# Patient Record
Sex: Female | Born: 1935
Health system: Southern US, Community
[De-identification: ages and names within clinical notes are randomized; demographics above are authoritative.]

## PROBLEM LIST (undated history)

## (undated) DIAGNOSIS — M545 Other chronic pain: Secondary | ICD-10-CM

## (undated) DIAGNOSIS — I712 Thoracic aortic aneurysm, without rupture, unspecified: Secondary | ICD-10-CM

## (undated) DIAGNOSIS — M1710 Unilateral primary osteoarthritis, unspecified knee: Secondary | ICD-10-CM

## (undated) DIAGNOSIS — K5792 Diverticulitis of intestine, part unspecified, without perforation or abscess without bleeding: Secondary | ICD-10-CM

## (undated) DIAGNOSIS — E7800 Pure hypercholesterolemia, unspecified: Secondary | ICD-10-CM

## (undated) DIAGNOSIS — I10 Essential (primary) hypertension: Secondary | ICD-10-CM

## (undated) DIAGNOSIS — E669 Obesity, unspecified: Secondary | ICD-10-CM

## (undated) DIAGNOSIS — G8929 Other chronic pain: Secondary | ICD-10-CM

## (undated) DIAGNOSIS — L280 Lichen simplex chronicus: Secondary | ICD-10-CM

## (undated) DIAGNOSIS — G454 Transient global amnesia: Secondary | ICD-10-CM

## (undated) DIAGNOSIS — M8580 Other specified disorders of bone density and structure, unspecified site: Secondary | ICD-10-CM

## (undated) DIAGNOSIS — N390 Urinary tract infection, site not specified: Secondary | ICD-10-CM

## (undated) DIAGNOSIS — M179 Osteoarthritis of knee, unspecified: Secondary | ICD-10-CM

## (undated) HISTORY — DX: Low back pain: M54.5

## (undated) HISTORY — DX: Obesity, unspecified: E66.9

## (undated) HISTORY — DX: Lichen simplex chronicus: L28.0

## (undated) HISTORY — PX: APPENDECTOMY: SHX54

## (undated) HISTORY — DX: Other specified disorders of bone density and structure, unspecified site: M85.80

## (undated) HISTORY — DX: Pure hypercholesterolemia, unspecified: E78.00

## (undated) HISTORY — DX: Other chronic pain: M54.50

## (undated) HISTORY — DX: Osteoarthritis of knee, unspecified: M17.9

## (undated) HISTORY — DX: Other chronic pain: G89.29

## (undated) HISTORY — DX: Urinary tract infection, site not specified: N39.0

## (undated) HISTORY — DX: Essential (primary) hypertension: I10

## (undated) HISTORY — DX: Diverticulitis of intestine, part unspecified, without perforation or abscess without bleeding: K57.92

## (undated) HISTORY — DX: Transient global amnesia: G45.4

## (undated) HISTORY — PX: TOTAL KNEE ARTHROPLASTY: SHX125

## (undated) HISTORY — DX: Unilateral primary osteoarthritis, unspecified knee: M17.10

---

## 1998-11-15 ENCOUNTER — Encounter (INDEPENDENT_AMBULATORY_CARE_PROVIDER_SITE_OTHER): Payer: Self-pay

## 1998-11-15 ENCOUNTER — Other Ambulatory Visit: Admission: RE | Admit: 1998-11-15 | Discharge: 1998-11-15 | Payer: Self-pay | Admitting: Obstetrics and Gynecology

## 1999-06-04 ENCOUNTER — Encounter (INDEPENDENT_AMBULATORY_CARE_PROVIDER_SITE_OTHER): Payer: Self-pay | Admitting: Specialist

## 1999-06-04 ENCOUNTER — Ambulatory Visit (HOSPITAL_COMMUNITY): Admission: RE | Admit: 1999-06-04 | Discharge: 1999-06-04 | Payer: Self-pay | Admitting: Obstetrics and Gynecology

## 1999-08-27 ENCOUNTER — Other Ambulatory Visit: Admission: RE | Admit: 1999-08-27 | Discharge: 1999-08-27 | Payer: Self-pay | Admitting: Obstetrics and Gynecology

## 2001-01-20 ENCOUNTER — Other Ambulatory Visit: Admission: RE | Admit: 2001-01-20 | Discharge: 2001-01-20 | Payer: Self-pay | Admitting: Obstetrics and Gynecology

## 2002-01-24 ENCOUNTER — Encounter: Payer: Self-pay | Admitting: Orthopaedic Surgery

## 2002-01-27 ENCOUNTER — Inpatient Hospital Stay (HOSPITAL_COMMUNITY): Admission: RE | Admit: 2002-01-27 | Discharge: 2002-01-31 | Payer: Self-pay | Admitting: Orthopaedic Surgery

## 2002-02-24 HISTORY — PX: EXPLORATORY LAPAROTOMY: SUR591

## 2002-02-24 HISTORY — PX: COLON RESECTION SIGMOID: SHX6737

## 2002-03-08 ENCOUNTER — Encounter: Admission: RE | Admit: 2002-03-08 | Discharge: 2002-04-19 | Payer: Self-pay | Admitting: Orthopaedic Surgery

## 2002-08-12 ENCOUNTER — Encounter: Admission: RE | Admit: 2002-08-12 | Discharge: 2002-08-12 | Payer: Self-pay | Admitting: Internal Medicine

## 2002-08-12 ENCOUNTER — Encounter: Payer: Self-pay | Admitting: Internal Medicine

## 2002-08-31 ENCOUNTER — Encounter: Payer: Self-pay | Admitting: General Surgery

## 2002-08-31 ENCOUNTER — Ambulatory Visit (HOSPITAL_COMMUNITY): Admission: RE | Admit: 2002-08-31 | Discharge: 2002-08-31 | Payer: Self-pay | Admitting: General Surgery

## 2002-09-02 ENCOUNTER — Encounter: Payer: Self-pay | Admitting: Internal Medicine

## 2002-09-02 ENCOUNTER — Ambulatory Visit (HOSPITAL_COMMUNITY): Admission: RE | Admit: 2002-09-02 | Discharge: 2002-09-02 | Payer: Self-pay | Admitting: Internal Medicine

## 2002-09-14 ENCOUNTER — Encounter: Payer: Self-pay | Admitting: General Surgery

## 2002-09-14 ENCOUNTER — Encounter: Admission: RE | Admit: 2002-09-14 | Discharge: 2002-09-14 | Payer: Self-pay | Admitting: General Surgery

## 2002-09-28 ENCOUNTER — Encounter: Payer: Self-pay | Admitting: General Surgery

## 2002-09-29 ENCOUNTER — Ambulatory Visit (HOSPITAL_COMMUNITY): Admission: RE | Admit: 2002-09-29 | Discharge: 2002-09-29 | Payer: Self-pay | Admitting: General Surgery

## 2002-10-05 ENCOUNTER — Encounter (INDEPENDENT_AMBULATORY_CARE_PROVIDER_SITE_OTHER): Payer: Self-pay | Admitting: Specialist

## 2002-10-05 ENCOUNTER — Inpatient Hospital Stay (HOSPITAL_COMMUNITY): Admission: RE | Admit: 2002-10-05 | Discharge: 2002-10-13 | Payer: Self-pay | Admitting: General Surgery

## 2002-10-06 ENCOUNTER — Encounter: Payer: Self-pay | Admitting: General Surgery

## 2004-03-20 ENCOUNTER — Other Ambulatory Visit: Admission: RE | Admit: 2004-03-20 | Discharge: 2004-03-20 | Payer: Self-pay | Admitting: Internal Medicine

## 2004-10-05 ENCOUNTER — Encounter: Admission: RE | Admit: 2004-10-05 | Discharge: 2004-10-05 | Payer: Self-pay | Admitting: Orthopaedic Surgery

## 2004-11-05 ENCOUNTER — Inpatient Hospital Stay (HOSPITAL_COMMUNITY): Admission: RE | Admit: 2004-11-05 | Discharge: 2004-11-08 | Payer: Self-pay | Admitting: Orthopaedic Surgery

## 2004-12-02 ENCOUNTER — Encounter: Admission: RE | Admit: 2004-12-02 | Discharge: 2005-01-28 | Payer: Self-pay | Admitting: Orthopaedic Surgery

## 2005-05-17 ENCOUNTER — Emergency Department (HOSPITAL_COMMUNITY): Admission: EM | Admit: 2005-05-17 | Discharge: 2005-05-17 | Payer: Self-pay | Admitting: Emergency Medicine

## 2006-04-09 ENCOUNTER — Encounter: Admission: RE | Admit: 2006-04-09 | Discharge: 2006-04-09 | Payer: Self-pay | Admitting: Orthopedic Surgery

## 2006-06-11 ENCOUNTER — Other Ambulatory Visit: Admission: RE | Admit: 2006-06-11 | Discharge: 2006-06-11 | Payer: Self-pay | Admitting: Internal Medicine

## 2007-01-08 ENCOUNTER — Inpatient Hospital Stay (HOSPITAL_COMMUNITY): Admission: EM | Admit: 2007-01-08 | Discharge: 2007-01-12 | Payer: Self-pay | Admitting: Emergency Medicine

## 2009-06-05 ENCOUNTER — Encounter: Admission: RE | Admit: 2009-06-05 | Discharge: 2009-06-05 | Payer: Self-pay | Admitting: Otolaryngology

## 2009-07-10 ENCOUNTER — Other Ambulatory Visit: Admission: RE | Admit: 2009-07-10 | Discharge: 2009-07-10 | Payer: Self-pay | Admitting: Internal Medicine

## 2010-06-30 ENCOUNTER — Emergency Department (HOSPITAL_BASED_OUTPATIENT_CLINIC_OR_DEPARTMENT_OTHER)
Admission: EM | Admit: 2010-06-30 | Discharge: 2010-06-30 | Disposition: A | Discharge status: Home / Self Care | Payer: Medicare Other | Attending: Emergency Medicine | Admitting: Emergency Medicine

## 2010-06-30 DIAGNOSIS — R040 Epistaxis: Secondary | ICD-10-CM | POA: Insufficient documentation

## 2010-06-30 DIAGNOSIS — I10 Essential (primary) hypertension: Secondary | ICD-10-CM | POA: Insufficient documentation

## 2010-06-30 DIAGNOSIS — Z79899 Other long term (current) drug therapy: Secondary | ICD-10-CM | POA: Insufficient documentation

## 2010-07-09 NOTE — Discharge Summary (Signed)
Latoya Curry, Latoya Curry NO.:  [id number]   MEDICAL RECORD NO.:  [id number]          PATIENT TYPE:  INP   LOCATION:  5034                         FACILITY:  [facility]   PHYSICIAN:  Latoya Curry, M.D.DATE OF BIRTH:  [birth year]   DATE OF ADMISSION:  01/08/2007  DATE OF DISCHARGE:  01/12/2007                               DISCHARGE SUMMARY   ADMISSION DIAGNOSES:  1. Impacted left subcapital femur fracture.  2. Hypokalemia.  3. Hypertension.  4. Hyperlipidemia.  5. History of diverticulosis.  6. Bladder prolapse.   DISCHARGE DIAGNOSES:  1. Impacted left subcapital femur fracture status post cannulated      pinning.  2. Constipation.  3. Hypertension.  4. Hyperlipidemia.  5. History of diverticulosis.  6. Bladder prolapse on prophylactic antibiotics.   SURGICAL PROCEDURES:  On January 09, 2007, [name] underwent a in  situ cannulated pinning of her left impacted subcapital femur fracture.  This was done by Dr. Claude Manges. Curry assisted by Rexene Edison, PA-C.   COMPLICATIONS:  None.   CONSULTS:  1. An [facility] medical consult by Dr. Kevan Curry done January 09, 2007.  2. Physical therapy consult on January 10, 2007, in addition to a      case management consult.   HISTORY OF PRESENT ILLNESS:  This 77 year old white female patient  presented to the emergency department with a history of bilateral knee  replacements.  She reports she fell off the side of the tub last night  at about 8:00 p.m. while shaving her legs.  She started having  increasing pain on the left hip after the fall, and then this morning  came to the ER.  She was found to have an impacted subcapital femur  fracture, and because of that she is being admitted for surgical  fixation.   HOSPITAL COURSE:  On admission, the patient's potassium was low; that  was supplemented.  A medical consult was obtained for preoperative  clearance.  They felt she was stable for surgery.   She subsequently  underwent surgery on January 09, 2007.   On postoperative day #1, T max was 97.4, vitals were stable.  The leg  was neurovascularly intact.  Potassium was 3.2 and supplements were  continued.  She was started on therapy per protocol.   On postoperative day #2, she was feeling better.  She has done well with  therapy.  Potassium was 4.6.  Hemoglobin and hematocrit were 12.8 and  37.  Vitals were stable.  The leg was neurovascularly intact.  She had  some constipation that was treated with a laxative and plans were made  for probable discharge home the next day.   On postoperative day #3, she continues to do well.  She is ambulating  well.  Pain is well-controlled with medications.  The wound is intact.  Foot is neurovascularly intact.  It is felt she is ready for discharge  home and will be discharged home later today.   DISCHARGE INSTRUCTIONS:   DIET:  She can resume her regular prehospitalization diet.   MEDICATIONS:  She may resume her prehospitalization medications.  Medications at this time include:  1. Ambien 5 mg p.o. q.h.s. p.r.n. insomnia.  2. Lovenox 40 mg subcutaneous q 8 a.m. with the last dose to be      January 23, 2007.  3. Tenormin 25 mg p.o. q.a.m.  4. Crestor 5 mg p.o. q 6 p.m.  5. Robaxin 500 mg 1 tablet p.o. q.6h. p.r.n. for spasms.  6. Percocet 1-2 tablets p.o. q.4h. p.r.n. for pain.  7. Laxative of choice, stool softener of choice as needed for      constipation.   ACTIVITY:  She can be out of bed weightbearing as tolerated on the left  leg with the use of the walker.  She is to have home health PT and  increase activity slowly.   WOUND CARE:  She is to keep her left hip incision clean and dry and may  keep the dressing on it today for 3-5 more days.  At that time, she can  change it.  She is to notify Dr. Cleophas Curry of temperature greater than  101.5, chills, pain unrelieved by pain medications, or foul-smelling  drainage from the  wound.   FOLLOWUP:  She needs to follow-up with Dr. Cleophas Curry in our office in  approximately 7-10 days and needs to call to [phone] for that  appointment.   LABORATORY DATA:  White count was elevated on January 09, 2007 at 13.8  with potassium low at 3.2.  On January 10, 2007, white count was 10.5,  hemoglobin 12.7, hematocrit 37.5, potassium 3.2.  On January 11, 2007,  white count 8.5, potassium 4.6.  All other laboratory studies were  within normal limits.      Latoya Curry, P.A.      Latoya Manges. Latoya Curry, M.D.  Electronically Signed    KED/MEDQ  D:  01/12/2007  T:  01/12/2007  Job:  [id number]   cc:   Dr. Kevan Curry

## 2010-07-09 NOTE — Consult Note (Signed)
Latoya Curry, Latoya Curry NO.:  [id number]   MEDICAL RECORD NO.:  [id number]          PATIENT TYPE:  INP   LOCATION:  5034                         FACILITY:  [facility]   PHYSICIAN:  Cheron Every, MD   DATE OF BIRTH:  1931/01/12   DATE OF CONSULTATION:  01/09/2007  DATE OF DISCHARGE:                                 CONSULTATION   PRIMARY CARE PHYSICIAN:  Dr. Johnella Moloney.   HISTORY OF PRESENT ILLNESS:  Latoya Curry is a 77 year old female with  a history of hypertension, status post partial colectomy, that had a  mechanical fall off the side of her tub while shaving her legs and  sustained a left subcapital hip fracture.  She denies any syncope.  No  chest pain.  No shortness of breath.  She is quite active and is able to  exercise greater then 4 METS without chest pain or shortness of breath.  She was in her normal state of health prior to the fall.  Has been on  prophylactic antibiotics for a bladder prolapse, but she is unsure what  the antibiotics were.   PAST MEDICAL HISTORY:  1. Diverticulitis, status post partial colectomy in 2004.  2. Hypertension.  3. Hyperlipidemia.  4. Right knee arthroplasty in 2003.  5. Left knee arthroplasty.   MEDICATIONS:  1. Atenolol 25 daily.  2. Crestor 5 mg daily.  3. Aleve p.r.n.  4. Coenzyme every 10 daily.  5. Unknown antibiotic.   ALLERGIES:  CODEINE WHICH CAUSES NAUSEA.   SOCIAL HISTORY:  The patient is independent of her activities of daily  living, and independent activities of daily living.  She denies any  tobacco, alcohol, or illicit drug use.  She lives at home with her  husband.   FAMILY HISTORY:  Mother deceased secondary to leukemia.  Father deceased  with coronary artery disease.   REVIEW OF SYSTEMS:  All systems reviewed and negative except as  previously stated.   PHYSICAL EXAMINATION:  VITAL SIGNS:  Blood pressure 156/89, heart rate  82, temperature 97.3.  GENERAL:  Alert and oriented x3.  No  apparent distress.  HEENT:  Normocephalic with a slight bruise over the left eye.  Oropharynx is clear.  CARDIOVASCULAR:  Regular rate and rhythm.  No murmurs, rubs, or gallops.  CHEST:  Clear to auscultation bilaterally.  ABDOMEN:  Positive bowel sounds.  Soft, nontender, nondistended.  EXTREMITIES:  Warm with 2+ pulses bilaterally without edema.  SKIN:  Without rashes.   LABORATORY DATA:  CBC is pending.  Coags are within normal limits with a  PT of 14.6, INR 1.1, and PTT 31.  Electrolytes show potassium of 3.2,  bicarbonate of 30, BUN and creatinine of 21 and 0.6, glucose 119.  Chest  x-ray is pending.  EKG shows sinus rhythm, heart rate 85.  No ST or T  wave changes.   IMPRESSION/PLAN:  1. Preoperative evaluation for hip repair.  The patient is low cardiac      risk.  She only has 1 modified lee cardiac risk factor which is her      age.  Will continue with beta blocker as she is already on this at      home.  Will titrate the dose to keep her heart rate less than or      equal to 70.  2. Hip fracture per orthopedics:  Possibly may go to the operating      room tomorrow.  3. Hypokalemia:  Will replace with 40 mEq with a goal potassium of 4.  4. Pneumonia prophylaxis:  Will order incentive spirometer.  5. Deep venous thrombosis prophylaxis:  SCDs and Lovenox per      orthopedics after hemostasis is obtained postoperatively.   Appreciate this consult.  We will continue to follow with you.  Thank  you very much.      Cheron Every, MD  Electronically Signed     MLW/MEDQ  D:  01/09/2007  T:  01/09/2007  Job:  [id number]

## 2010-07-09 NOTE — Op Note (Signed)
NAMESTARLENA, Latoya Curry NO.:  [id number]   MEDICAL RECORD NO.:  [id number]          PATIENT TYPE:  INP   LOCATION:  5034                         FACILITY:  [facility]   PHYSICIAN:  Claude Manges. Whitfield, M.D.DATE OF BIRTH:  [birth year]   DATE OF PROCEDURE:  01/09/2007  DATE OF DISCHARGE:                               OPERATIVE REPORT   PREOPERATIVE DIAGNOSIS:  Impacted subcapital fracture, left hip.   POSTOPERATIVE DIAGNOSIS:  Impacted subcapital fracture, left hip.   PROCEDURE:  In situ cannulated pinning, subcapital fracture, left hip.   SURGEON:  Claude Manges. Cleophas Dunker, M.D.   ASSISTANT:  Rexene Edison, [facility]   ANESTHESIA:  General.   COMPLICATIONS:  None.   PROCEDURE:  With the patient on the operating room bed, she was placed  under general orotracheal anesthesia.  She was then carefully  transferred to the fracture table.  The right lower extremity was  carefully padded and placed at 90/90.  The left lower extremity was  placed in minimal traction with slight internal rotation.  Image  intensification demonstrated the impacted subcapital fracture of the  left hip without displacement.   The left hip was then prepped with DuraPrep from the iliac crest to  below the knee.  Sterile draping was performed.   About an inch and a half incision was made along the lateral aspect of  her left hip, beginning at the greater trochanter, extending distally in  the mid lateral line by sharp dissection.  The incision was carried down  to the subcutaneous tissue.  Adipose tissue was then bluntly dissected.  Any bleeders were Bovie coagulated.  A self-retaining retractor was  inserted.   The Synthes cannulated screw system was utilized.  A guide pin was then  placed along the proximal lateral femur just below the greater  trochanter.  Its position was checked with image intensification.  The  pen was then placed under image intensification guidance through the  femoral neck and  head.  The first pin was placed proximally and checked  in both AP and lateral projection and was perfectly centered.  A second  guide pin was then placed parallel to the first, beginning a little bit  more inferiorly.  The final position of the guide pins was then checked  in both AP and lateral projections.  They were both well within the head  and beyond the fracture.  Both pins measured 90 mm in length.  The  titanium 6.5-mm cannulated screws were utilized.  They were placed under  power.  The screws went in perfectly with very nice purchase against the  lateral femoral cortex.  There were hand tightened.  Again, final films  revealed perfect position of the cannulated screws well within the  femoral head and with nice purchase of the fracture.  The guide pins  were then removed.  The wound was irrigated with saline solution.  The  fascia was closed  with interrupted 0 Vicryl and the skin closed with skin clips.  A  sterile bulky dressing was applied.  The patient was then transferred to  the operating room  stretcher and returned to the post anesthesia  recovery room in satisfactory condition.      Claude Manges. Cleophas Dunker, M.D.  Electronically Signed     PWW/MEDQ  D:  01/09/2007  T:  01/11/2007  Job:  [id number]

## 2010-07-12 NOTE — H&P (Signed)
Latoya Curry, Latoya NO.:  [id number]   MEDICAL RECORD NO.:  [id number]          PATIENT TYPE:  INP   LOCATION:  NA                           FACILITY:  [facility]   PHYSICIAN:  Claude Manges. Whitfield, M.D.DATE OF BIRTH:  02/28/1930   DATE OF ADMISSION:  11/05/2004  DATE OF DISCHARGE:                                HISTORY & PHYSICAL   PREADMISSION HISTORY AND PHYSICAL   CHIEF COMPLAINT:  Left knee pain for excess of six months.   HISTORY OF PRESENT ILLNESS:  The patient is a 77 year old white female with  a history of right total knee arthroplasty in 2003, good results.  She has  been having problems progressively with pain and difficulty with her left  knee over the last six months.  The patient does have a deep, dull, aching,  toothache-type quality of pain generally throughout the knee.  It does  radiate up and down the leg.  She denies any current mechanical symptoms.  She does have to ambulate with the use of a cane and pain medicines.  She  does have difficulty at night with sleep.  Patient does have x-rays that  reveal decreased medial joint space and an MRI that shows significant  extensive complex tears of the posterior horns medially, advanced  degenerative changes of the medial compartment with extensive hylan  cartilage lost and a lateral compartment with degenerative changes.   ALLERGIES:  1.  Codeine.  2.  Intolerant to long-term use of cholesterol medications causing cramping.   CURRENT MEDICATIONS:  1.  Atenolol/hydrochlorothiazide 50/25 mg p.o. daily.  2.  Crestor 2.5 mg p.o. daily.  3.  Aspirin 81 mg p.o. daily.  4.  Coenzyme Q-10, 1 tablet daily   PAST MEDICAL HISTORY:  1.  Hypertension.  2.  Hypercholesterolemia.  3.  History of diverticulitis with a colectomy.   PAST SURGICAL HISTORY:  1.  Right total knee arthroplasty in 2003.  2.  Colectomy in 2004.   The patient denies any complications to above-mentioned surgical procedures  other  than some postoperative nausea.   SOCIAL HISTORY:  The patient is a 77 year old white female, fairly healthy  appearing.  Denies any history of smoking or alcohol use.  She is married  and lives with husband in a [place] house.  She is currently retired.   FAMILY PHYSICIAN:  Candyce Churn, M.D., at [phone].   FAMILY MEDICAL HISTORY:  Mother is deceased from leukemia.  Father is  deceased from heart disease.  No siblings.   REVIEW OF SYSTEMS:  Positive for glasses and occasional increased urinary  frequency due to the hydrochlorothiazide.  Other than that, review of  systems categories are negative if not mentioned above attributing to the  knee.   PHYSICAL EXAMINATION:  VITALS:  Height is 5 feet 6 inches.  Weight is 174  pounds.  Blood pressure is 148/88.  Pulse of 80 and regular.  Respirations  12.  Patient is afebrile.  GENERAL:  This is a healthy-appearing, age-appropriate female.  Ambulates  with a slight left-sided limp.  She is able to get herself  on and off the  exam table without any extreme distress.  HEENT:  Head was normocephalic.  Pupils are equal, round, and reactive.  Extraocular movements intact.  Sclerae is not icteric.  External ears  without deformities. Gross hearing is intact.  Oral buccal mucosa is pink  and moist.  NECK:  Supple.  No palpable lymphadenopathy.  Thyroid region was nontender.  Patient had good range of motion of her cervical spine without any  difficulty or tenderness.  CHEST:  Lung sounds were clear and equal bilaterally.  No wheezes, rales or  rhonchi.  HEART:  Regular rate and rhythm.  No murmurs, rubs or gallops.  ABDOMEN:  Round, soft, nontender.  Bowel sounds are normoactive.  CVA region  was nontender.  She had a well-healed midline incision.  EXTREMITIES:  Upper extremities were symmetric in size and shape.  She had  good range of motion of her shoulders, elbows and wrist.  Motor strength was  5/5.  Lower extremities:  Right  and left hip had full range of motion with  extension, flexion, internal and external rotation without any difficulty.  Her right knee had a well-healed midline surgical incision.  No signs of  effusion, erythema or ecchymosis.  She had full extension, able to flex it  back to about 110 degrees.  No instability.  The calf was nontender.  Left  knee has no signs of erythema or ecchymosis.  She was tender along the  medial and lateral joint line.  She had full extension and flexion to 110.  She had some slight valgus/varus laxity with some soreness.  The calf was  nontender.  The ankles were symmetrical with good dorsi and plantar flexion.  PERIPHERAL VASCULAR:  Carotid pulses were 2+.  No bruits.  Radial pulses  were 2+, and dorsalis and posterior tibial pulses were 1+.  She had no lower  extremity edema or venostasis changes.  A few scattered varicosities.  NEURO:  Patient was conscious, alert and appropriate with use of  conversation.  Exam of her cranial nerves II-XII were grossly intact.  She  had no gross neurologic defects noted.  BREASTS:  Deferred at this time.  RECTAL:  Deferred at this time.  GENITOURINARY:  Deferred at this time.   IMPRESSION:  1.  Severe osteoarthritis, left knee.  2.  History of hypertension.  3.  History of hypercholesterolemia.  4.  History of diverticulitis with colectomy in the past.   PLAN:  Patient will undergo all the routine labs and tests prior to having a  left total knee arthroplasty by Dr. Cleophas Dunker at [facility] on  November 05, 2004.      Jamelle Rushing, P.A.      Claude Manges. Cleophas Dunker, M.D.  Electronically Signed    RWK/MEDQ  D:  10/31/2004  T:  10/31/2004  Job:  [id number]

## 2010-07-12 NOTE — Op Note (Signed)
NAME:  Latoya Curry, Latoya Curry                        ACCOUNT NO.:  [id number]   MEDICAL RECORD NO.:  [id number]                   PATIENT TYPE:  INP   LOCATION:  0007                                 FACILITY:  [facility]   PHYSICIAN:  [name]. [name], M.D.             DATE OF BIRTH:  12-19-1930   DATE OF PROCEDURE:  10/05/2002  DATE OF DISCHARGE:                                 OPERATIVE REPORT   PREOPERATIVE DIAGNOSES:  1. A 3 cm mass of the body of the pancreas.  2. Diverticulitis.   POSTOPERATIVE DIAGNOSES:  1. Tortuous splenic artery simulating pancreatic mass, normal pancreatic     exam.  2. Diverticulitis.   OPERATION/PROCEDURE:  1. Sigmoid colon resection with primary anastomosis.  2. Intraoperative ultrasound evaluation of pancreas including     interpretation.  3. Exploration of the retroperitoneum of the upper abdomen including the     pancreas with mobilization of the pancreas.   SURGEON:  [name]. [name], M.D.   FIRST ASSISTANT:  Currie Paris, M.D.   OPERATIVE INDICATIONS:  This is a 77 year old white female who has had  several episodes of diverticulitis.  Earlier this year, she had an episode  of requiring antibiotics for several weeks and a CT scan was performed which  showed thickening and inflammation of the sigmoid colon and a small  pericolonic abscess.  Also noted on this CT scan was a 3.3 cm x 2.8 cm mass  in the body of the pancreas to the left of the superior mesenteric artery.  There was no inflammatory change with fluid collection around this.  The  diverticulitis ultimately resolved and followup CT scan showed that the  abscess had resolved.  An MRI of the abdomen was done which showed soft  tissue prominence in the pancreas bordered by a tortuous splenic artery and  a loop of duodenum or jejunum.  It was still felt by radiology that this  suggested a pancreatic mass.  The patient was advised to undergo sigmoid  colon resection electively because  of her multiple bouts of diverticulitis  and her pericolonic abscess formation.  She agreed with that.  We did a  barium enema preoperatively which showed focal area of diverticula in the  sigmoid colon but no stricture and this was a very high quality barium enema  and no evidence of cancer.  She saw Dr. Tasia Catchings as an outpatient and  he did not feel that colonoscopy was warranted since the barium enema was  such a good study.  The patient was advised that she would need to have  elective sigmoid colon resection and that we should explore her pancreas at  that time because of the possibility of a pancreatic neoplasm.  She is  brought to the hospital and the operating room electively following a bowel  prep at home.   OPERATIVE FINDINGS:  The sigmoid colon was chronically thickened and  inflamed in a focal area.  The rest of the colon felt fine.  The small  intestine looked and felt normal.  The bladder looks normal and the  diverticulitis did not involve the bladder at all.  The pancreas was  completely explored and there was no intrinsic mass of the pancreatic  tissue.  What was causing the mass effect was a tortuous splenic artery  which made almost a complete 360 degree loop around the mid body of the  pancreas before going on to the hilum of the spleen.  The ultrasound  examination of the pancreas in the operating room failed to reveal any focal  mass.  I asked Dr. Lorinda Creed from the radiology department to review  her CT scan and MRI in light of the operative findings and he called me back  and told me that he thought that the pancreatic mass was being caused by a  tortuous splenic artery and he did not see any intrinsic obvious pancreatic  mass.  Based on these findings, Dr. Jamey Ripa and I did not feel that any  pancreatic surgery would be warranted.  The spleen felt normal. The liver  felt normal. The gallbladder felt normal.  The stomach and duodenum felt  normal.   There was no adenopathy.   DESCRIPTION OF PROCEDURE:  Upon induction of general endotracheal  anesthesia, a central line was placed by the anesthesia department.  A Foley  catheter was inserted.  A nasogastric tube was inserted.  The patient's  abdomen was prepped and draped in the sterile fashion.   Midline laparotomy incision was made, both above and below the umbilicus.  The abdomen was entered and explored with the findings as described above.  Self-retaining retractors were placed.  We mobilized the descending colon  and the splenic flexure somewhat taking this away from the lower pole of the  spleen.  We divided the gastrocolic omentum close to the stomach, ligating  gastric colic vessels with 2-0 silk ties.  We tried to preserve all of the  vasa brevia going to the spleen.  Self-retaining retractor was placed to  retract the stomach cephalad.  We did have an excellent exposure of the mid  body and tail of the pancreas and the hilum of the spleen.  We performed  ultrasound examination of the pancreas both anteriorly and posteriorly  following mobilization.  We used color-flow Doppler and could identify the  multiple vessels, the splenic artery and the splenic vein but we did not see  any intrinsic disease process of parenchyma of the pancreas.  We divided the  peritoneum at the lower edge of the pancreas and mobilized the entire body  and most of tail of the pancreas until we could pass out hand completely  behind the pancreas above and below.  We identified the splenic artery  throughout its course.  We identified the celiac axis and left gastric  artery.  We performed ultrasound examination of the posterior surface of the  pancreas.  We did not find any abnormality of the pancreas.  We had a small  tear in the capsule of the lower pole of the spleen which was controlled nicely with pressure and hemostatic gauze.  We asked Dr. Loralie Champagne from  radiology department to review the  MRI and the CT scan and he did so and  stated that he felt that based on our operative findings the x-ray findings  were very consistent with a tortuous splenic artery and no  intrinsic  pancreatic disease.   At this point Dr. Jamey Ripa and I packed off the upper abdomen and decided that  no further pancreatic exploration or surgery was warranted.  We repositioned  the patient and repositioned the self-retaining retractors to expose the  descending colon and the sigmoid colon.  We fully mobilized the sigmoid  colon and proximal rectum by dividing the lateral peritoneal attachments.  We identified the iliac bifurcation and the left ureter. We identified a  focally thickened area of mid sigmoid and we transected the colon between  Allen clamps proximally and distally.  The proximal transection was in the  proximal sigmoid and the distal transection was at the rectosigmoid junction  just above the peritoneal reflection.  The colon was quite healthy at both  points and there was no diverticula above or below the lines of resection.  Mesentery vessels were isolated, clamped, divided and ligated with 2-0 silk  ties and 2-0 silk ligatures.  Hemostasis was excellent.   The anastomosis was performed in an end-to-end fashion.  We lined up the  ends of the colon.  Corner sutures of 3-0 silk were placed and held as  traction sutures.  Mattress suture of 3-0 silk was placed in the posterior  wall and the center.  Interrupted sutures were placed across the posterior  wall and the posterior wall of the anastomosis was completed.  We placed  inverting sutures around the corners sequentially, placing these and tying  these until we brought the anastomotic repair anteriorly on both sides.  We  then completed the anastomosis with interrupted simple sutures.  This  performed a very nice anastomosis of a wide anastomosis.  The bowel was pink  and healthy throughout.  The anastomosis was inspected and felt that  it  looked fine.   At this point we changed our instruments and gloves.  We irrigated the  abdomen and pelvis with 2000 mL of saline.  We inspected the bleeding and  there was none.  We closed the mesentery with interrupted sutures of 2-0  silk.   We then reexamined the stomach and pancreas and splenic area.  We found that  there was good hemostasis.  We irrigated this area.  We placed a 19 Blake  drain in the retrogastric, retroperitoneum, beside and behind the pancreas  and brought this out in the left upper quadrant.  We sutured the drains to  the skin with nylon suture and connected this to a suction bulb.   Further survey of the abdomen and pelvis revealed no further bleeding.  The  omentum was returned to its anatomic position.  The midline fascia was  closed with a running suture of #1 PDS and the skin closed with skin staples.  Kling bandages were placed and the patient taken to the recovery  room in stable condition.  Estimated blood loss was about 200 mL.  Complications - none.  Sponge, needle and instrument counts were correct.                                               [name]. [name], M.D.    HMI/MEDQ  D:  10/05/2002  T:  10/05/2002  Job:  [id number]   cc:   Candyce Churn, M.D.  [address] [place]  [place] [address]  Fax: [phone]   Tasia Catchings,  M.D.  [address]  [place]  [place] [address]  Fax: [phone]

## 2010-07-12 NOTE — H&P (Signed)
NAMETIMICA, MARCOM NO.:  [id number]   MEDICAL RECORD NO.:  [id number]                   PATIENT TYPE:   LOCATION:                                       FACILITY:   PHYSICIAN:  Claude Manges. Cleophas Dunker, M.D.            DATE OF BIRTH:  June 10, 1930   DATE OF ADMISSION:  01/27/2002  DATE OF DISCHARGE:                                HISTORY & PHYSICAL   CHIEF COMPLAINT:  Progressively worsening right knee pain for the last 10  years.   HISTORY OF PRESENT ILLNESS:  This 77 year old white female patient presents  to Dr. Cleophas Dunker with a 10-year history of gradual onset but progressively  worsening right knee pain.  She has had no known injury or prior surgery to  her knee, but she reports over the last year or so the pain has gotten much  worse.   At this point the pain is described as an intermittent soreness which  becomes sharp at times and seems to be located mostly over the medial aspect  of the joint without radiation.  It increases with prolonged walking and  decreases with rest.  She complains of the knee grabbing at times and  occasionally swelling and occasionally keeping her up at night.  No popping,  catching, locking, or giving way.  She is currently taking Aleve for the  pain, and that provides a moderate amount of relief.  She has received  cortisone in the past and has had two series of Hyalgan injections, and the  first one helped a tremendous amount.  The second one was not as effective.  She is currently not ambulating with any assistive devices.   ALLERGIES:  CODEINE causes nausea and vomiting.   CURRENT MEDICATIONS:  1. Atenolol 25 mg 1 tablet p.o. q.d.  2. Zetia 10 mg 1 tablet p.o. q.d.  3. Aleve 1 tablet p.o. b.i.d. p.r.n. pain.  4. Antioxidants 1 tablet p.o. q.d.  5. Vitamin B1 1 tablet p.o. q.d.   PAST MEDICAL HISTORY:  1. She has had hypertension for the last 10 years which is followed by Dr.     Johnella Moloney.  2. She  denies any history of diabetes mellitus, thyroid disease, hiatal     hernia, peptic ulcer disease, heart disease, asthma, or any other chronic     medical condition other than noted previously.   PAST SURGICAL HISTORY:  1. Left breast biopsy approximately 1993.  2. D&C by Dr. Thomasena Edis approximately three years ago.   She denies any complications from the above-mentioned surgeries.   SOCIAL HISTORY:  She does drink alcohol about once a week but denies any  history of cigarette smoking or drug use.  She is married and lives with her  husband in a one-story house with four steps into the main entrance.  She is  retired from [employer].  She and her husband have three  children.  PRIMARY M.D.:  Her medical doctor is Dr. Johnella Moloney, and his telephone  number is [phone].   FAMILY HISTORY:  Her mother died at the age of 8 with leukemia.  Her father  died at the age of 57 with hypertension, heart disease, and heart failure.  She has no siblings.  Her children are ages 27, 37, and 41 and are all  girls, and they are all alive and well.   REVIEW OF SYSTEMS:  She does complain of occasional sinus congestion which  she treats with Allegra.  She does have urinary frequency and nocturia 1-2  times a night.  She went through menopause about age 48.  She does wear  glasses.  She has shortness of breath with walking up or down hills.  She  does have a Living Will but does not have a power of attorney.  All other  systems are negative and noncontributory.   PHYSICAL EXAMINATION:  GENERAL:  Well-developed, well-nourished, thin white  female in no acute distress.  Walks without a real noticeable limp.  Mood  and affect are appropriate.  Talks easily with the examiner.  VITAL SIGNS:  Height 5 feet 7 inches, weight 162 pounds.  BMI is 24.5.  Temperature 98.2 degrees Fahrenheit, pulse 76, respirations 20, and BP  122/70.  HEENT:  Normocephalic, atraumatic.  Without frontal or  maxillary sinus  tenderness to palpation.  Conjunctivae pink.  Sclerae nonicteric.  PERRLA.  EOMs intact.  No visible external ear deformities.  Hearing grossly intact.  Tympanic membranes pearly gray bilaterally with good light reflex.  Nose and  nasal septum midline.  Nasal mucosa pink and moist without exudate or polyps  noted.  Buccal mucosa pink and moist.  Good dentition.  Pharynx without  erythema or exudate.  Tongue and uvula midline.  Tongue without  vesiculations, and uvula rises equally with phonation.  NECK:  No visible masses or lesions noted.  Trachea is midline.  No palpable  lymphadenopathy or thyromegaly.  Carotids +2 bilaterally, without bruits.  She has full range of motion of her cervical spine with the exception of  equally decreased lateral bending to both sides.  Nontender to palpation  along the cervical side.  CARDIOVASCULAR:  Heart rate and rhythm regular.  S1, S2 present without  rubs, clicks, or murmurs noted.  LUNGS:  Respirations even and unlabored.  Breath sounds clear to  auscultation bilaterally without rales or wheezes noted.  ABDOMEN:  Rounded abdominal contour.  Bowel sounds present x 4 quadrants.  Soft and nontender to palpation, without hepatosplenomegaly or CVA  tenderness.  Femoral pulses +2 bilaterally.  Nontender to palpation along  the entire length of the vertebral column.  BREASTS/GENITOURINARY/RECTAL/PELVIC:  These exams deferred at this time.  MUSCULOSKELETAL:  No obvious deformities bilateral upper extremities, with  full range of motion of these extremities, without pain.  Radial pulses +2  bilaterally.  She has full range of motion of her hips, ankles, and toes  bilaterally.  DP and PT pulses are +2, and there is no lower extremity  edema.  EXTREMITIES:  The left knee has full extension and flexion to 135 degrees.  There is no crepitus or pain with palpation along the joint line.  No effusion.  Stable to varus and valgus stress.  Negative  Homans', and no pain  with palpation of the calf.  The right knee has full extension also but  flexion only to 125 degrees.  Minimal crepitus with range of motion, but  no  effusion.  She is acutely tender to palpation over the medial joint line.  None really laterally.  Stable to varus and valgus stress.  Negative  Homans', and no pain with palpation of the calf.  NEUROLOGIC:  Alert and oriented x 3.  Cranial nerves II-XII are grossly  intact.  Strength 5/5 bilateral upper and lower extremities.  Rapid  alternating movements intact.  Deep tendon reflexes 2+ bilateral upper and  lower extremities.  Sensation intact to light touch.   LABORATORY DATA:  Radiologic findings:  X-rays taken in November 2000 of her  right knee showed narrowing of the joint space in the medial compartment  with only 2-3 mm of space left at that time.  There was also spurring noted  about the femoral condyle on the lateral joint line and also spurring on the  undersurface of the patella.   IMPRESSION:  1. Osteoarthritis, right knee.  2. Hypertension.   PLAN:  The patient will be admitted to [facility] on January 27, 2002, when she will undergo a right total knee arthroplasty by Dr. Claude Manges.  Whitfield.  She will undergo all the routine preoperative laboratory tests  and studies prior to this procedure.     Legrand Pitts Duffy, P.A.                      Claude Manges. Cleophas Dunker, M.D.    KED/MEDQ  D:  01/12/2002  T:  01/12/2002  Job:  [id number]

## 2010-07-12 NOTE — Discharge Summary (Signed)
NAMEKINYA, MEINE NO.:  [id number]   MEDICAL RECORD NO.:  [id number]          PATIENT TYPE:  INP   LOCATION:  5025                         FACILITY:  [facility]   PHYSICIAN:  Claude Manges. Whitfield, M.D.DATE OF BIRTH:  27-Feb-1930   DATE OF ADMISSION:  11/05/2004  DATE OF DISCHARGE:  11/08/2004                                 DISCHARGE SUMMARY   ADMITTING DIAGNOSES:  1.  Left knee pain/severe osteoarthritis.  2.  History of hypertension.  3.  History of hypercholesterolemia.  4.  History of diverticulitis with colostomy in the past.   DISCHARGE DIAGNOSES:  1.  Status post left total knee arthroplasty.  2.  Acute blood loss anemia secondary to surgery.  3.  Hypokalemia.  4.  History of hypertension.  5.  History of hypercholesterolemia.  6.  History of diverticulitis with colectomy in the past.   HISTORY OF PRESENT ILLNESS:  [name] is a 77 year old white female with  a history of right total knee arthroplasty in 2006, with good results.  The  patient is having progressively worsening left knee pain over the past six  months.  The patient describes the pain as deep dull aching tooth-like pain  throughout the knee.  The pain does radiate up and down the leg.  She denies  any current mechanical symptoms.  She uses a cane to ambulate.  Positive  awake pain.  X-rays reveal decreased medial joint space, and an MRI of the  knee shows significant extensive complex tears of the posterior horn  medially, advanced changes at the medial compartment with extensive island  cartilage loss and lateral compartment degenerative changes.   ALLERGIES:  1.  CODEINE.  2.  INTOLERANT TO LONG-TERM USE OF CHOLESTEROL MEDICATIONS CAUSING CRAMPING.   CURRENT MEDICATIONS:  1.  Atenolol/hydrochlorothiazide 50/25 mg daily.  2.  Crestor 2.5 mg daily.  3.  Aspirin 81 mg daily.  4.  CoenzymeQ-10 one tab daily.   SURGICAL PROCEDURE:  The patient was taken to the operating room on  November 05, 2004 by Dr. Norlene Campbell, assisted by Rexene Edison, P.A.  The  patient was placed under general anesthesia and received a supplemental  femoral nerve block.  Left total knee arthroplasty was then performed using  the following components -- a left standard plus femoral component, a size  three tibial tray, 10 millimeter standard plus bearing, and three pegged  metal backed patella, standard plus.  The patient tolerated the procedure  well and returned to recovery in good stable condition.   CONSULTS:  The following consults were obtained while the patient was  hospitalized:  PT/OT, Case Management.   HOSPITAL COURSE:  Postoperative day one the patient afebrile, vital signs  stable.  The patient's H&H was 12.6 and 36.9, white count 10,200.  The  patient's potassium was 2.7 and therefore replaced.   Postop day two the patient with poor pain control.  Therefore the PCA was  stopped, OxyContin 10 mg extended-release was started.  The patient was  noted to be tachycardic with a heart rate of 113, denied any chest pain or  shortness of breath.  Her heart rate was felt to be increased due to poor  pain control.  The patient's nausea also thought to be secondary to PCA.  The patient was otherwise afebrile, H&H 12.2 and 34.6, potassium was still  low at 2.8, continued to be replaced.  Next, the patient's PT was 21.1 and  INR was 1.8.  Next postop day three the patient afebrile, vital signs were  stable, heart rate was 92, H&H was 11.6 and 33.8, white count 9.6, potassium  3.0, PT 42.4, INR 4.5, the patient with no bleeding gums or nostrils.  Coumadin was held, heparin stopped.  Potassium was continued to be replaced.  Urine culture was negative.  The patient progressed well with physical  therapy and therefore was discharged later home that day to be followed by  home health with [place].   LABS:  Routine labs  on admission.  CBC -- white count 9.3, hemoglobin 15.4,  hematocrit  44.7, platelets 327.   Coags on admission -- all values within normal limits.   Routine chemistries on admission -- sodium 142, potassium 3.8, chloride 102,  bicarb 31, glucose 101 elevated.  Next, BUN 11, creatinine 0.7.   Hepatic enzymes -- all values within normal limits.   Urinalysis on admission -- leukocyte esterase moderate, otherwise negative.  Urine culture November 05, 2004 showed no growth.   EKG on October 31, 2004 showed normal sinus rhythm with heart rate of 64  beats per minute, PR interval was 202 msec, PRT axis 44, 16, 50.   DISCHARGE INSTRUCTIONS AND MEDS:  1.  The patient will continue atenolol/HCTZ 50/25 one daily.  2.  Coenzyme Q10 one daily.  3.  Crestor 5 mg half tab daily.  4.  Multivitamin one daily.  5.  Calcium chews one daily.  6.  Add Coumadin as directed by pharmacy/home health, [facility].  7.  OxyContin 10 mg sustained release one q.12h. for pain.  8.  Percocet 5/325 mg 1-2 tablets every 4-6 hours for breakthrough pain.  9.  Potassium 40 mEq one tablet daily x3 days.  10. Over the counter laxative for constipation.  11. No aspirin while on Coumadin.  12. Diet -- no restrictions.  13. Activity -- the patient is 50% weight-bearing left leg with a walker.  14. Wound care -- the patient is to keep the wound clean and dry and change      dressing daily.  Call our office if temp is greater than 101.5, foul-      smelling drainage, pain under poor control, may shower after two days      with no drainage.   SPECIAL INSTRUCTIONS:  1.  CPM 0-90 degrees 6-8 hours a day, increase by 10 degrees daily.  2.  PT/INR checked in a.m. by Genevieve Norlander.  3.  Home health PT with [facility].   FOLLOWUP:  1.  The patient needs to follow-up with Dr. Cleophas Dunker in approximately 11      days from discharge.  The patient is to call [phone], for an      appointment.  2.  The patient is to follow-up with Dr. Kevan Ny in 2-3 weeks after discharge     for her potassium and hemoglobin  checks.      Latoya Curry, P.A.      Claude Manges. Cleophas Dunker, M.D.  Electronically Signed    GC/MEDQ  D:  01/20/2005  T:  01/20/2005  Job:  [id number]   cc:   Candyce Churn,  M.D.  Fax: [phone]   Claude Manges. Cleophas Dunker, M.D.  Fax: [phone]

## 2010-07-12 NOTE — Op Note (Signed)
NAMEMarland Kitchen  Latoya Curry, Latoya Curry                        ACCOUNT NO.:  [id number]   MEDICAL RECORD NO.:  [id number]                   PATIENT TYPE:  INP   LOCATION:  2889                                 FACILITY:  [facility]   PHYSICIAN:  Claude Manges. Cleophas Dunker, M.D.            DATE OF BIRTH:  02-20-1931   DATE OF PROCEDURE:  01/27/2002  DATE OF DISCHARGE:                                 OPERATIVE REPORT   PREOPERATIVE DIAGNOSIS:  End stage osteoarthritis, right knee.   POSTOPERATIVE DIAGNOSIS:  End stage osteoarthritis, right knee.   OPERATION PERFORMED:  Right total knee replacement.   SURGEON:  Claude Manges. Cleophas Dunker, M.D.   ASSISTANT:  Jamelle Rushing, P.A.   ANESTHESIA:  General orotracheal anesthesia with supplemental femoral nerve  block.   COMPLICATIONS:  None.   COMPONENTS:  Depuy LCS complete standard plus femoral component, #3 rotating  tibial platform with a keeled design, 10 mm polyethylene bridging bearing  and a metal backed cruciate designed rotating patella.  All were secured  with polymethyl methacrylate.   DESCRIPTION OF PROCEDURE:  With the patient comfortable on the operating  table and under general orotracheal anesthesia, the right lower extremity  was placed in a thigh tourniquet.  The nursing staff then inserted a Foley  catheter.  The right lower extremity was then prepped with Betadine scrub  and then DuraPrep from the tourniquet to the mid foot.  Sterile draping was  performed.  With the extremity still elevated, it was Esmarch exsanguinated  with the proximal tourniquet at 350 mmHg.   A midline longitudinal incision was made centered above the patella and  extended from the superior part of the tibial tubercle.  By sharp  dissection, the incision was carried down through the subcutaneous tissues  to the first layer of capsule.  A medial parapatellar incision was then made  with a Bovie to the deep capsule.  There was just minimal joint effusion.   The patella was  everted to 180 degrees and the knee flexed to 90 degrees.  There was a moderate amount of synovitis.  This was removed with the Bovie.  The joint was explored.  There were no loose bodies.  There was considerable  osteoarthritis of both the medial and lateral femoral condyle, more so  medially and laterally with complete loss of articular cartilage in both the  medial tibial plateau and medial femoral condyle with large osteophytes and  almost complete absence of articular cartilage in the patella.   Preoperatively, we had measured a standard femoral component and this was  confirmed intraoperatively.  We also templated a #3 tibial tray and this was  also confirmed intraoperatively.  The appropriate femoral and tibial jigs  were applied in the appropriate femoral and tibial cuts.  The ACL and PCL  were sacrificed.  The MCL and LCL remained intact throughout the operative  procedure.  The 7 degree angle of inclination was  used to cut the tibial  surface and a 4 degree distal femoral valgus cut was utilized.  Lamina  spreaders were inserted in both medial and lateral compartments to remove  menisci and remnants of ACL and PCL.  Osteophytes along the posterior  femoral condyle were removed with a curved half-inch osteotome.   Final cuts were made in the tibia to accept the trial prosthesis.  We had  used a 12.5 flexion-extension gap and they were felt to be symmetrical.  The  trial components were then inserted.  Initially, we tried a 12.5 mm tibial  polyethylene component and felt we had extension but not full extension.  Then I trialed a 10 mm and we had slight hyperextension and still did not  have any significant opening under varus or valgus stress, so I felt that  that was probably the best component.   The patella was then prepared by removing 10 mm of bone leaving 14 mm of  thickness.  The cruciate jig was applied to obtain the cruciate cut in the  patella.   The trial patellar  component was applied.  The knee reduced and placed  through a full range of motion without dislocation or subluxation.   Trial components were removed.  The joint was then copiously irrigated with  jet saline lavage and antibiotic solution.  The final components were then  secured with polymethyl methacrylate.  The knee was placed in extension.  Extraneous methacrylate was removed.  The patella was applied with a  patellar clamp.  After complete maturation, the joint was then explored.  We  trialed both the 12.5 and the 10 polyethylene components.  I still felt that  10 was the best fit.  The extraneous hard methacrylate was removed with an  osteotome.  Tourniquet was deflated.  Gross bleeders were Bovie coagulated.  We removed another osteophyte from behind the medial femoral condyle  posteriorly and then inserted the final 10 mm bridging bearing.   The wound was again irrigated with saline solution.  Gross bleeders were  Bovie coagulated.  A Hemovac was not necessary.  The deep capsule was closed  with interrupted #1 Tycron, the superficial capsule closed with a running 0  Vicryl, the subcu with a running 2-0 Vicryl, skin closed with skin clips.  A  sterile bulky dressing was applied.  The patient had good pulses to her  foot.  The femoral nerve block was to be provided by Dr. Zoila Shutter.   The patient tolerated the procedure without complications.                                                Claude Manges. Cleophas Dunker, M.D.    PWW/MEDQ  D:  01/27/2002  T:  01/27/2002  Job:  [id number]

## 2010-07-12 NOTE — Op Note (Signed)
[facility]  Patient:    Latoya Curry, Latoya Curry                     MRN: [id number] Proc. Date: 06/04/99 Adm. Date:  [id number] Attending:  Madelyn Flavors CC:         Pearla Dubonnet, M.D.                           Operative Report  PREOPERATIVE DIAGNOSIS:       Postmenopausal bleeding, endometrial mass.  POSTOPERATIVE DIAGNOSIS:      Postmenopausal bleeding, probable submucosal fibroid.  OPERATION:                    Dilatation and curettage, hysteroscopy.  SURGEON:                      Beather Arbour. Thomasena Edis, M.D.  ASSISTANT:  ANESTHESIA:                   Monitored anesthesia care plus 10 cc of 1% lidocaine paracervical block.  DRAINS:                       None.  COMPLICATIONS:                None.  ESTIMATED BLOOD LOSS:         Minimal.  FLUIDS:                       Approximately 1000 cc of Crystalloid.  DESCRIPTION OF PROCEDURE:     The patient was brought to the operating room and  identified on the operating table.  After the patient was adequately sedated using MAC analgesia, she was placed in the dorsal lithotomy position and prepped and draped in the usual sterile fashion.  The bladder was straight catheterized for  approximately 100 cc of clear yellow urine.  Examination under anesthesia revealed the uterus to be mobile and anteverted, approximately eight weeks size and irregular consistent with fibroids.  The speculum was placed after prepping and  draping in the usual sterile fashion, and the anterior lip of the cervix was infiltrated with 1 cc of 1% lidocaine and grasped with a single tooth tenaculum. The cervix was very gently dilated up to a #25 Pratt dilator after the remaining 9 cc of 1% lidocaine were infused for a paracervical block.  Using Sorbitol as the distending medium, the hysteroscope was placed.  A careful and thorough hysteroscopic examination was performed.  The endometrium was noted to be  thin.  There was noted to be an anterior space-occupying endometrial mass on a broad base consistent with a fibroid.  Both tubal ostia were identified.  At that point, the hysteroscopy was terminated and curettage was performed in a systematic clockwise fashion with tissue obtained.  Portions of the fibroid were obtained as biopsies. The hysteroscope was again placed and the endometrium was noted to have been sampled in its entirety and also portions of the fibroid were noted to have been sampled as well.  There was noted to be minimal bleeding.  At that point, the procx was then terminated.  The patient tolerated the procedure well.  There was noted to be no bleeding from the tenaculum site and very minimal bleeding from the uterus. The patient was given Toradol 30 mg IV for postoperative analgesia.  The patient was transferred to the recovery room in stable condition after all sponge, needle, and instrument counts were correct.  The patient was given the postoperative instruction sheet, urged to call with heavy bleeding or any problems.  She will  return to the office in two to three weeks for a postoperative evaluation and to refrain from placing anything in her vagina until that time.  She is told that he may take ibuprofen 600 mg every six hours p.r.n. pain.  Of note, the patients preoperative EKG showed a probable inferior infarct and we will fax this over to her internist, Pearla Dubonnet, M.D. today and have her follow up with him.  DD:  06/04/99 TD:  06/04/99 Job: [id number]

## 2010-07-12 NOTE — Discharge Summary (Signed)
Latoya Curry, Latoya Curry                        ACCOUNT NO.:  [id number]   MEDICAL RECORD NO.:  [id number]                   PATIENT TYPE:  INP   LOCATION:  0368                                 FACILITY:  [facility]   PHYSICIAN:  [name]. [name], M.D.             DATE OF BIRTH:  [birth year]   DATE OF ADMISSION:  10/05/2002  DATE OF DISCHARGE:  10/13/2002                                 DISCHARGE SUMMARY   FINAL DIAGNOSES:  1. Diverticulitis.  2. Tortuous splenic artery mimicking pancreatic mass.  3. Hypertension.   OPERATIONS:  1. Sigmoid colon resection with primary anastomosis.  2. Intraoperative ultrasound of the pancreas.  3. Exploration of the retroperitoneum including the pancreas and     mobilization of the pancreas.   DATE OF SURGERY:  October 05, 2002.   HISTORY OF PRESENT ILLNESS:  This is a 77 year old white female who has had  several documented episodes of diverticulitis. The diverticulitis has been  documented on CT scan showing inflammation of the sigmoid colon and a small  pericolonic abscess. Also noted on the CT scan was a 3.3 cm mass in the body  of the pancreas to the left of the superior mesenteric artery. There was no  inflammatory change associated with this. Her diverticulitis resolved.  Followup CT scan showed that the abscess resolved. MRI of the abdomen was  done, which showed soft tissue prominence in the pancreas, bordered by a  tortuous splenic artery and a loop of duodenum or jejunum. The patient was  advised to undergo elective sigmoid colon resection and exploration and  possible resection of the pancreas. She had had a barium enema  preoperatively, which showed no other abnormalities and had been followed as  an outpatient by Dr. Shari Heritage.   PHYSICAL EXAMINATION:  GENERAL: A pleasant, older, middle aged woman in no  distress.  NECK: No mass. Thyroid was borderline enlarged. No jugular venous  distention.  LUNGS: Clear to auscultation. No  chest wall tenderness.  CARDIOVASCULAR: Regular rate and rhythm. No murmur.  ABDOMEN: Soft and nontender. No mass. No hernia. Liver and spleen were not  enlarged.  LYMPHATIC: Showed no enlarged lymph nodes in the neck or groins.   HOSPITAL COURSE:  On the day of admission, the patient was taken to the  operating room and explored. The sigmoid colon was chronically inflamed. The  sigmoid colon resection was uneventful. The pancreas was explored and  mobilized and ultrasound examination was performed. The only abnormality was  a tortuous splenic artery, making nearly a 360 degree loop, mimicking a  mass. We involved Dr. Loralie Champagne from the radiology department who  reviewed the CT and MRI and he felt that the images were consistent with  simply a tortuous splenic artery. Therefore, no pancreatic resection was  done.   Postoperatively, the patient did reasonable well. We left the Jackson-Pratt  drain around the pancreas for a few days  but it did not drain much. She had  a little bit of problem with urinary retention but that resolved. Pathology  report showed a diverticulitis. No evidence of colon cancer. She had some  problems with atelectasis and low grade fever but that cleared up with  pulmonary toilet maneuvers and mobilization. She progressed in her diet and  activities and had resumption of bowel function without any major  complication.   DISPOSITION:  On October 13, 2002 she was discharge. At that time, she was  feeling well, wanted to go home and was tolerating a regular diet and had  had bowel movements. Her wound was healing uneventfully. We took her staples  out. She was given a prescription for Vicodin for pain. She was told to  continue her usual medications which include Atenolol 25 mg daily, Zetia 10  mg daily, and Aleve p.r.n. for knee pain.   FOLLOW UP:  She was asked to return to be seen in the office in 2 weeks.                                                [name]. [name], M.D.    HMI/MEDQ  D:  11/01/2002  T:  11/01/2002  Job:  [id number]   cc:   Candyce Churn, M.D.  [address] [place]  [place] [address]  Fax: [phone]   Tasia Catchings, M.D.  [address]  [place]  [place] [address]  Fax: [phone]

## 2010-07-12 NOTE — Op Note (Signed)
Latoya Curry, Latoya Curry NO.:  [id number]   MEDICAL RECORD NO.:  [id number]          PATIENT TYPE:  INP   LOCATION:  NA                           FACILITY:  [facility]   PHYSICIAN:  Claude Manges. Whitfield, M.D.DATE OF BIRTH:  21-May-1930   DATE OF PROCEDURE:  11/05/2004  DATE OF DISCHARGE:                                 OPERATIVE REPORT   PREOPERATIVE DIAGNOSIS:  End stage osteoarthritis, left knee.   POSTOPERATIVE DIAGNOSIS:  End stage osteoarthritis, left knee.   OPERATION PERFORMED:  Left total knee replacement.   SURGEON:  Claude Manges. Cleophas Dunker, M.D.   ASSISTANT:  Richardean Canal, P.A.   ANESTHESIA:  General orotracheal anesthesia with supplemental femoral nerve  block.   COMPLICATIONS:  None.   COMPONENTS:  Depuy LCS complete standard plus femoral component.  A #3  rotating tibial tray.  10 mm bridging bearing.  A metal backed rotating  three-peg patella.  All components were secured with polymethyl  methacrylate.   DESCRIPTION OF PROCEDURE:  With the patient comfortable on the operating  table and under general orotracheal anesthesia, tourniquet was applied to  the left lower extremity. Nursing staff inserted a Foley catheter and  obtained a sterile specimen for culture and sensitivity.   The left lower extremity was then prepped with Betadine scrub and then  DuraPrep from tourniquet to midfoot.  Sterile draping was performed.  With  the extremity still elevated, it was still Esmarch exsanguinated with the  proximal tourniquet at 350 mmHg.   A midline longitudinal incision was made centered about the patella  extending from superior proximal tibial tubercle.  Via sharp dissection, the  incision was carried down to the subcutaneous tissue.  The first layer of  capsule was incised in the midline.  A medial parapatellar incision was then  made with a Bovie through the deep capsule.  There was a clear yellow joint  effusion of approximately 25 mm.  The patella was  everted 180 degrees, the  knee flexed to 90 degrees.  The knee was then inspected with evidence of  moderate beefy red synovitis.  Synovectomy was performed.  There were  moderately large osteophytes, particularly along the medial femoral condyle  and to a lesser extent, the lateral femoral condyle.  There was nearly  complete absence of articular cartilage of the medial femoral condyle and to  a great extent on the patella.   Preoperatively, we had measured either a standard or standard plus femoral  component.  The standard plus was confirmed intraoperatively as well as a #3  rotating keeled tibial tray.   Initial cut was then made transversely on the tibia with a 7 degree angle of  declination using a guide.  The subsequent cuts were then made on the femur  using a 10 mm flexion extension gap.  ACL and PCL were sacrificed as were  medial and lateral menisci.  LCL and MCL remained intact throughout the  operative procedure. A 4 degree distal femoral valgus cut was utilized.  Lamina spreaders were used to remove any remaining osteophytes behind the  femoral condyles medially and  laterally and any remaining remnants of medial  and lateral menisci or ACL and PCL.  Retractor was then placed about the  tibia, center hole was then made after confirming a #3 tibial tray.  The  trial tibial component was then applied with the keeled attachment.  A 10 mm  bridging bearing was inserted followed by a standard plus femoral component.  We had full range of motion with slight hyperextension, no opening with  varus or valgus stress.   The patella was then prepared by removing 10 mm of bone which we were to  replace with a prosthesis leaving approximately 10 to 14 mm of patella  thickness.  The patella jig was applied.  Three holes made.  The patella  trial was attached and through a full range of motion there was no  subluxation.   The trial components were removed.  The joint was then copiously  irrigated  with jet saline.   The final components were then secured with polymethyl methacrylate.  The  tibia was initially inserted.  Extraneous methacrylate was removed from its  periphery. A 10 mm bridging bearing was inserted followed by the cemented  standard plus femoral component.  Again extraneous methacrylate was removed.  Patella was applied with methacrylate and patella clamped.  After complete  maturation of the methacrylate, the joint was inspected and the hardened  extraneous methacrylate was removed with an osteotome.  Joint was again  irrigated with jet saline.  Tourniquet was deflated.  Gross bleeders were  Bovie coagulated.  We had a very nice dry field.  Hemovac was not necessary.  Again through a full range of motion there was no opening to varus or valgus  stress.  The patella tracked midline.   Deep capsule was closed with interrupted  #1 Ethibond.  Superficial capsule  closed with a running 0 Vicryl, subcu with 2-0 Vicryl.  Skin closed with  skin clips.  Sterile bulky dressing was applied followed by patient's  support stocking.  The patient did receive a preanesthetic femoral nerve  block for postoperative pain control.   The patient tolerated the procedure without complications.      Claude Manges. Cleophas Dunker, M.D.  Electronically Signed     PWW/MEDQ  D:  11/05/2004  T:  11/05/2004  Job:  [id number]

## 2010-07-12 NOTE — Discharge Summary (Signed)
NAMEMarland Kitchen  Latoya, Curry                        ACCOUNT NO.:  [id number]   MEDICAL RECORD NO.:  [id number]                   PATIENT TYPE:  INP   LOCATION:  5031                                 FACILITY:  [facility]   PHYSICIAN:  Claude Manges. Cleophas Dunker, M.D.            DATE OF BIRTH:  [birth year]   DATE OF ADMISSION:  01/27/2002  DATE OF DISCHARGE:  01/31/2002                                 DISCHARGE SUMMARY   ADMISSION DIAGNOSES:  1. Severe osteoarthritis right knee.  2. Hypertension.   DISCHARGE DIAGNOSES:  1. Right total knee arthroplasty.  2. Hypertension.   HISTORY OF PRESENT ILLNESS:  The patient is a 77 year old white female  patient of Dr. Hoy Register with a history of 10 years of progressively  worsening right knee pain.  The patient denies any injury or surgical  procedures.  The pain is intermittent with some general soreness about the  knee with sharp pains over the medial joint.  It worsens with any type of  weightbearing activity.  It improves with rest.  She does have a grabbing  sensation in the knee with ambulation.  Occasionally it does swell.  She  does have occasional night pain.  She is not using any assistive devices.   ALLERGIES:  CODEINE.   CURRENT MEDICATIONS:  1. Atenolol 25 mg p.o. q.d.  2. Zetia 10 mg p.o. q.d.  3. Aleve one tablet p.o. b.i.d. p.r.n.  4. Antioxidants and vitamin B1.   SURGICAL PROCEDURE:  On January 27, 2002 patient was taken to the OR by Dr.  Norlene Campbell.  Assisted by Jamelle Rushing, P.A.-C.  Under general  anesthesia with supplemented femoral nerve block, the patient had a right  total knee replacement with a Depuy LCS complete standard femoral component,  a number 3 rotating tibial platform with a keeled design, 10 mm polyethylene  purging bearing with a metal back cruciate designed rotating patella.  All  components were cemented in place.  The patient tolerated the procedure  well.  There were no complications and the patient  was transferred to the  recovery room in good condition.  No drains were left in place.   CONSULTS:  The following routine consults requested:  Physical therapy,  occupational therapy, rehabilitation, case management.   HOSPITAL COURSE:  On January 27, 2002 patient was admitted to [facility] under the care of Dr. Norlene Campbell.  The patient was taken to  the OR where a right total knee arthroplasty was performed.  The patient  tolerated the procedure well.  There were no complications.  No drains left  in place.  The patient had a postoperative femoral nerve block for  persistent pain control and the patient was placed on Coumadin.   The patient then incurred a total of four days postoperative care on the  orthopedic floor in which the patient initially had some slow progress with  the use of  CPM and physical therapy.  The patient eventually got her pain  under control and then progressed better with physical therapy and the CPM.  The patient's wound remained benign for any signs of infection.  Her leg  remained neuro/motor vascularly intact.  Vital signs remained stable.  She  remained afebrile.  There were no other significant complaints or problems  other than she corrected very easily with adjustment of the Coumadin dosing.   On postoperative day number four patient had participated with physical  therapy with just supervision.  She was able to ambulate a minimum of 25  feet two times so it was felt that she was ready to be discharged to home.  Arrangements were made for home health physical therapy and RN and a CPM and  she was discharged to home in good condition.   LABORATORIES:  CBC on December 7:  WBC 9.4, hemoglobin 11.4, hematocrit  32.7, platelets 226,000.  Routine chemistries on December 6:  Sodium 134,  potassium 2.8.  The patient will receive some potassium p.o. upon discharge.  Coag studies on December 7:  PT was 29.2 with an INR of 3.5.  On date of   discharge INR was 3.0.   Chest x-ray on admission shows no active disease with calcified granuloma in  left lower lobe.  No evidence of acute disease.  EKG was normal sinus  rhythm, normal EKG at 71 beats per minute.   DISCHARGE MEDICATIONS:  1. Colace 100 mg p.o. b.i.d.  2. Trinsicon one capsule p.o. t.i.d.  3. Coumadin 0.5 mg p.o. q.d.  4. Tenormin 25 mg p.o. q.d.  5. Zetia 10 mg p.o. q.d.  6. OxyContin CR 10 mg p.o. q.12h.  7. Percocet one or two tablets q.4-6h. p.r.n. pain.  8. Laxative or enema or choice p.r.n.  9. Robaxin 500 mg p.o. q.6h. p.r.n.  10.      Tylenol 650 mg p.o. q.4h. p.r.n.   DISCHARGE INSTRUCTIONS:  1. Medications:  The patient is to resume routine home medications.     OxyContin CR 10 mg one tablet q.12h., Percocet 5 mg one or two tablets     q.4-6h. for pain if needed, Coumadin 1 mg tablets one-half tablet 0.5 mg     q.d. unless changed by [facility] pharmacists, K-Dur 20 mEq one tablet p.o.     b.i.d. for seven days.  2. Pain management:  See above.  3. Activity:  As tolerated with the use of a walker.  4. Diet:  No restrictions.  5. Wound care:  Monitor wound daily for any signs of infection.  Call Dr.     Hoy Register office for any questions.  6. Special instructions:  CPM at 0-50 degrees for eight hours a day,     increase by 5-10 degrees a day.  7. Follow-up:  The patient should have a follow-up appointment with Dr.     Cleophas Dunker in 10 days from discharge.   CONDITION ON DISCHARGE:  The patient's condition on discharge to home is  listed as improved and good.     Jamelle Rushing, P.A.                      Claude Manges. Cleophas Dunker, M.D.     RWK/MEDQ  D:  01/31/2002  T:  01/31/2002  Job:  [id number]

## 2010-12-03 LAB — BASIC METABOLIC PANEL
BUN: 15
BUN: 15
BUN: 20
BUN: 21
CO2: 28
CO2: 29
CO2: 31
CO2: 30
Calcium: 8.8
Calcium: 8.9
Calcium: 9
Calcium: 9.4
Chloride: 104
Chloride: 105
Chloride: 108
Chloride: 101
Creatinine, Ser: 0.61
Creatinine, Ser: 0.63
Creatinine, Ser: 0.69
Creatinine, Ser: 0.69
GFR calc Af Amer: >60
GFR calc Af Amer: >60
GFR calc Af Amer: >60
GFR calc Af Amer: >60
GFR calc non Af Amer: >60
GFR calc non Af Amer: >60
GFR calc non Af Amer: >60
GFR calc non Af Amer: >60
Glucose, Bld: 119 — ABNORMAL HIGH
Glucose, Bld: 128 — ABNORMAL HIGH
Glucose, Bld: 92
Glucose, Bld: 119 — ABNORMAL HIGH
Potassium: 3.2 — ABNORMAL LOW
Potassium: 3.3 — ABNORMAL LOW
Potassium: 4.6
Potassium: 3.2 — ABNORMAL LOW
Sodium: 138
Sodium: 142
Sodium: 145
Sodium: 140

## 2010-12-03 LAB — URINALYSIS, ROUTINE W REFLEX MICROSCOPIC
Bilirubin Urine: NEGATIVE
Glucose, UA: NEGATIVE
Hgb urine dipstick: NEGATIVE
Ketones, ur: NEGATIVE
Nitrite: NEGATIVE
Protein, ur: NEGATIVE
Specific Gravity, Urine: 1.01
Urobilinogen, UA: 0.2
pH: 6

## 2010-12-03 LAB — CBC
HCT: 37
HCT: 37.5
HCT: 42.8
Hemoglobin: 12.7
Hemoglobin: 12.8
Hemoglobin: 14.7
MCHC: 34
MCHC: 34.5
MCHC: 34.4
MCV: 90.3
MCV: 90.7
MCV: 89.9
Platelets: 209
Platelets: 214
Platelets: 264
RBC: 4.1
RBC: 4.13
RBC: 4.76
RDW: 13
RDW: 13.1
RDW: 12.8
WBC: 10.5
WBC: 8.5
WBC: 13.8 — ABNORMAL HIGH

## 2010-12-03 LAB — DIFFERENTIAL
Basophils Absolute: 0
Basophils Relative: 0
Eosinophils Absolute: 0.1 — ABNORMAL LOW
Eosinophils Relative: 1
Lymphocytes Relative: 12
Lymphs Abs: 1.6
Monocytes Absolute: 0.7
Monocytes Relative: 5
Neutro Abs: 11.3 — ABNORMAL HIGH
Neutrophils Relative %: 82 — ABNORMAL HIGH

## 2010-12-03 LAB — PROTIME-INR
INR: 1.1
Prothrombin Time: 14.6

## 2010-12-03 LAB — APTT: aPTT: 31

## 2011-09-22 ENCOUNTER — Encounter: Payer: Self-pay | Admitting: Internal Medicine

## 2011-09-23 ENCOUNTER — Ambulatory Visit (INDEPENDENT_AMBULATORY_CARE_PROVIDER_SITE_OTHER)
Admission: RE | Admit: 2011-09-23 | Discharge: 2011-09-23 | Disposition: A | Discharge status: Home / Self Care | Payer: Medicare Other | Source: Ambulatory Visit | Attending: Internal Medicine | Admitting: Internal Medicine

## 2011-09-23 ENCOUNTER — Encounter: Payer: Self-pay | Admitting: Internal Medicine

## 2011-09-23 ENCOUNTER — Ambulatory Visit (INDEPENDENT_AMBULATORY_CARE_PROVIDER_SITE_OTHER): Payer: Medicare Other | Admitting: Internal Medicine

## 2011-09-23 VITALS — BP 112/72 | HR 75 | Temp 97.8°F | Ht 66.5 in | Wt 77.4 lb

## 2011-09-23 DIAGNOSIS — R05 Cough: Secondary | ICD-10-CM

## 2011-09-23 DIAGNOSIS — R059 Cough, unspecified: Secondary | ICD-10-CM

## 2011-09-23 HISTORY — DX: Cough, unspecified: R05.9

## 2011-09-23 MED ORDER — OMEPRAZOLE 20 MG PO CPDR: DELAYED_RELEASE_CAPSULE | ORAL | Status: DC

## 2011-09-23 NOTE — Progress Notes (Signed)
Quick Note:  Called patient and left message to call back on home number, unable to leave message on cell number. ______

## 2011-09-23 NOTE — Patient Instructions (Addendum)
Change the prilosec to 20 mg Take 30- 60 min before your first and last meals of the day   Stop fish oil  GERD (REFLUX)  is an extremely common cause of respiratory symptoms, many times with no significant heartburn at all.    It can be treated with medication, but also with lifestyle changes including avoidance of late meals, excessive alcohol, smoking cessation, and avoid fatty foods, chocolate, peppermint, colas, red wine, and acidic juices such as orange juice.  NO MINT OR MENTHOL PRODUCTS SO NO COUGH DROPS  USE SUGARLESS CANDY INSTEAD (jolley ranchers or Stover's)  NO OIL BASED VITAMINS - use powdered substitutes.  For cough, mucinex dm up to 2 every 12 hours and supplement with tramadol 50 mg one every 4 hours as need   Please remember to go to the x-ray department downstairs for your tests - we will call you with the results when they are available.  Please see patient coordinator before you leave today  to schedule sinus ct  Please schedule a follow up office visit in 4 weeks, sooner if needed.

## 2011-09-23 NOTE — Progress Notes (Signed)
  Subjective:    Patient ID: Latoya Curry, female    DOB: Jul 03, 1930, 78 y.o.   MRN: [id number]  HPI  74 yowf never smoker no allergies or asthma with tendency to pnds/ nasal symptoms since around 2000 complicated by several nose bleeds then started the cough and saw an allergist with neg w/u by [facility] around 2010 with lingering cough since then so referred 09/23/2011 to pulmonary clinic   09/23/2011 1st pulmonary eval cc indolent onset progressively worse prod cough daily x sev years esp while eating> sev tbsp thick white also at bedtime but rarely while sleep and no exac over night early am persisting despite prilosec x > 1 y taken after breakfast and occ sec dinner or bedtime. Has sense of mild pnds with min sneezing not really changed since 2000 onset.     Also denies any obvious fluctuation of symptoms with weather or environmental changes or other aggravating or alleviating factors except as outlined above    Review of Systems  Constitutional: Negative for fever, chills and unexpected weight change.  HENT: Positive for sneezing. Negative for ear pain, nosebleeds, congestion, sore throat, rhinorrhea, trouble swallowing, dental problem, voice change, postnasal drip and sinus pressure.   Eyes: Negative for visual disturbance.  Respiratory: Positive for cough. Negative for choking and shortness of breath.   Cardiovascular: Negative for chest pain and leg swelling.  Gastrointestinal: Negative for vomiting, abdominal pain and diarrhea.  Genitourinary: Negative for difficulty urinating.  Musculoskeletal: Negative for arthralgias.  Skin: Negative for rash.  Neurological: Negative for tremors, syncope and headaches.  Hematological: Does not bruise/bleed easily.       Objective:   Physical Exam Pleasant amb wf nad Wt Readings from Last 3 Encounters:  09/23/11 77 lb 6.4 oz (35.108 kg)    HEENT: nl dentition, turbinates, and orophanx. Nl external ear canals without cough reflex   NECK  :  without JVD/Nodes/TM/ nl carotid upstrokes bilaterally   LUNGS: no acc muscle use, clear to A and P bilaterally without cough on insp or exp maneuvers   CV:  RRR  no s3 or murmur or increase in P2, no edema   ABD:  soft and nontender with nl excursion in the supine position. No bruits or organomegaly, bowel sounds nl  MS:  warm without deformities, calf tenderness, cyanosis or clubbing  SKIN: warm and dry without lesions    NEURO:  alert, approp, no deficits    CXR  09/23/2011 :  Cardiomegaly and chronic interstitial thickening. No acute process or explanation for cough. Pulm review: no sign ild.        Assessment & Plan:

## 2011-09-23 NOTE — Assessment & Plan Note (Addendum)
The most common causes of chronic cough in immunocompetent adults include the following: upper airway cough syndrome (UACS), previously referred to as postnasal drip syndrome (PNDS), which is caused by variety of rhinosinus conditions; (2) asthma; (3) GERD; (4) chronic bronchitis from cigarette smoking or other inhaled environmental irritants; (5) nonasthmatic eosinophilic bronchitis; and (6) bronchiectasis.   These conditions, singly or in combination, have accounted for up to 94% of the causes of chronic cough in prospective studies.   Other conditions have constituted no >6% of the causes in prospective studies These have included bronchogenic carcinoma, chronic interstitial pneumonia, sarcoidosis, left ventricular failure, ACEI-induced cough, and aspiration from a condition associated with pharyngeal dysfunction.   .Chronic cough is often simultaneously caused by more than one condition. A single cause has been found from 38 to 82% of the time, multiple causes from 18 to 62%. Multiply caused cough has been the result of three diseases up to 42% of the time and note she is on relatively high doses of Beta blocker so might have component of cough variant asthma but most likely this is Upper airway cough syndrome, so named because it's frequently impossible to sort out how much is  CR/sinusitis with freq throat clearing (which can be related to primary GERD)   vs  causing  secondary (" extra esophageal")  GERD from wide swings in gastric pressure that occur with throat clearing, often  promoting self use of mint and menthol lozenges that reduce the lower esophageal sphincter tone and exacerbate the problem further in a cyclical fashion.   These are the same pts (now being labeled as having "irritable larynx syndrome" by some cough centers) who not infrequently have a history of having failed to tolerate ace inhibitors,  dry powder inhalers or biphosphonates or report having atypical reflux symptoms that  don't respond to standard doses of PPI , and are easily confused as having aecopd or asthma flares by even experienced allergists/ pulmonologists.   For now rec eval sinus ct and max gerd rx x one month.  Discussed with pt:  The standardized cough guidelines recently published in Chest by [place] in 2006  are a multiple step process (up to 12!) , not a single office visit,  and are intended  to address this problem logically,  with an alogrithm dependent on response to empiric treatment at  each progressive step  to determine a specific diagnosis with  minimal addtional testing needed. Therefore if compliance is an issue or can't be accurately verified then it's very unlikely the standard evaluation and treatment will be successful here.    Furthermore, response to therapy (other than acute cough suppression, which should only be used short term with avoidance of narcotic containing cough syrups if possible), can be a gradual process for which the patient may not receive immediate benefit.  Unlike going to an eye doctor where the right rx is almost always the first one and is immediately effective, this is almost never the case in the management of chronic cough syndromes and the patient needs to commit up front to compliance with recommendations and have the patience to wait out a response for up to 6 weeks of therapy directed at the likely underlying problem(s).

## 2011-09-24 NOTE — Progress Notes (Signed)
Quick Note:  Spoke with pt and notified of results per Dr. Wert. Pt verbalized understanding and denied any questions.  ______ 

## 2011-09-25 ENCOUNTER — Encounter: Payer: Self-pay | Admitting: Internal Medicine

## 2011-09-25 ENCOUNTER — Ambulatory Visit (INDEPENDENT_AMBULATORY_CARE_PROVIDER_SITE_OTHER)
Admission: RE | Admit: 2011-09-25 | Discharge: 2011-09-25 | Disposition: A | Discharge status: Home / Self Care | Payer: Medicare Other | Source: Ambulatory Visit | Attending: Internal Medicine | Admitting: Internal Medicine

## 2011-09-25 DIAGNOSIS — R05 Cough: Secondary | ICD-10-CM

## 2011-09-25 DIAGNOSIS — R059 Cough, unspecified: Secondary | ICD-10-CM

## 2011-10-01 ENCOUNTER — Telehealth: Payer: Self-pay | Admitting: Internal Medicine

## 2011-10-01 NOTE — Telephone Encounter (Signed)
Notes Recorded by Nyoka Cowden, MD on 09/25/2011 at 2:00 PM Call patient : Study is unremarkable, no change in recs   I spoke with spouse and is aware of results and had no questions

## 2011-10-21 ENCOUNTER — Ambulatory Visit (INDEPENDENT_AMBULATORY_CARE_PROVIDER_SITE_OTHER): Payer: Medicare Other | Admitting: Internal Medicine

## 2011-10-21 ENCOUNTER — Encounter: Payer: Self-pay | Admitting: Internal Medicine

## 2011-10-21 VITALS — BP 104/62 | HR 68 | Temp 97.7°F | Ht 66.0 in | Wt 175.6 lb

## 2011-10-21 DIAGNOSIS — R059 Cough, unspecified: Secondary | ICD-10-CM

## 2011-10-21 DIAGNOSIS — R05 Cough: Secondary | ICD-10-CM

## 2011-10-21 NOTE — Assessment & Plan Note (Signed)
-   Sinus CT 09/25/2011 > neg  Cough resolved to her satisfaction on max gerd rx and diet restrictions which I would continue for a full 3 months before considering any modifications  See instructions for specific recommendations which were reviewed directly with the patient who was given a copy with highlighter outlining the key components.

## 2011-10-21 NOTE — Progress Notes (Signed)
  Subjective:    Patient ID: Latoya Curry, female    DOB: 1930/06/15   MRN: [id number]  HPI  60 yowf never smoker no allergies or asthma with tendency to pnds/ nasal symptoms since around 2000 complicated by several nose bleeds then started the cough and saw an allergist with neg w/u by [facility]low around 2010 with lingering cough since then so referred 09/23/2011 to pulmonary clinic with h/o acei cough remotely.   09/23/2011 1st pulmonary eval cc indolent onset progressively worse prod cough daily x sev years esp while eating> sev tbsp thick white also at bedtime but rarely while sleep and no exac over night early am persisting despite prilosec x > 1 y taken after breakfast and occ sec dinner or bedtime. Has sense of mild pnds with min sneezing not really changed since 2000 onset.  rec Change the prilosec to 20 mg Take 30- 60 min before your first and last meals of the day  Stop fish oil GERD diet  For cough, mucinex dm up to 2 every 12 hours and supplement with tramadol 50 mg one every 4 hours as need  Please see patient coordinator before you leave today  to schedule sinus ct > neg    10/21/2011 f/u ov/Latoya Curry cc cough much better, minimal mucinex dm needed, no tramadol for pain.  No limiting sob. Sleeping ok without nocturnal  or early am exacerbation  of respiratory  c/o's or need for noct saba. Also denies any obvious fluctuation of symptoms with weather or environmental changes or other aggravating or alleviating factors except as outlined above   ROS  The following are not active complaints unless bolded sore throat, dysphagia, dental problems, itching, sneezing,  nasal congestion or excess/ purulent secretions, ear ache,   fever, chills, sweats, unintended wt loss, pleuritic or exertional cp, hemoptysis,  orthopnea pnd or leg swelling, presyncope, palpitations, heartburn, abdominal pain, anorexia, nausea, vomiting, diarrhea  or change in bowel or urinary habits, change in stools or urine,  dysuria,hematuria,  rash, arthralgias, visual complaints, headache, numbness weakness or ataxia or problems with walking or coordination,  change in mood/affect or memory.          Objective:   Physical Exam Pleasant amb wf nad   Wt Readings from Last 3 Encounters:  10/21/11 175 lb 9.6 oz (79.652 kg)  09/23/11 77 lb 6.4 oz (35.108 kg)    HEENT: nl dentition, turbinates, and orophanx. Nl external ear canals without cough reflex   NECK :  without JVD/Nodes/TM/ nl carotid upstrokes bilaterally   LUNGS: no acc muscle use, clear to A and P bilaterally without cough on insp or exp maneuvers   CV:  RRR  no s3 or murmur or increase in P2, no edema   ABD:  soft and nontender with nl excursion in the supine position. No bruits or organomegaly, bowel sounds nl  MS:  warm without deformities, calf tenderness, cyanosis or clubbing      CXR  09/23/2011 :  Cardiomegaly and chronic interstitial thickening. No acute process or explanation for cough. Pulm review: no sign ild.        Assessment & Plan:

## 2011-10-21 NOTE — Patient Instructions (Addendum)
If cough comes back the first step would be to change the tenoretic to ziac   If you are satisfied with your treatment plan let your doctor know and he/she can either refill your medications or you can return here when your prescription runs out.     If in any way you are not 100% satisfied,  please tell us.  If 100% better, tell your friends!

## 2014-06-05 DIAGNOSIS — N8111 Cystocele, midline: Secondary | ICD-10-CM | POA: Diagnosis not present

## 2014-07-20 DIAGNOSIS — Z1231 Encounter for screening mammogram for malignant neoplasm of breast: Secondary | ICD-10-CM | POA: Diagnosis not present

## 2014-09-25 DIAGNOSIS — N3000 Acute cystitis without hematuria: Secondary | ICD-10-CM | POA: Diagnosis not present

## 2014-09-25 DIAGNOSIS — N8111 Cystocele, midline: Secondary | ICD-10-CM | POA: Diagnosis not present

## 2014-10-02 DIAGNOSIS — H6123 Impacted cerumen, bilateral: Secondary | ICD-10-CM | POA: Diagnosis not present

## 2014-10-12 DIAGNOSIS — Z961 Presence of intraocular lens: Secondary | ICD-10-CM | POA: Diagnosis not present

## 2014-10-12 DIAGNOSIS — H26492 Other secondary cataract, left eye: Secondary | ICD-10-CM | POA: Diagnosis not present

## 2014-11-13 DIAGNOSIS — Z0001 Encounter for general adult medical examination with abnormal findings: Secondary | ICD-10-CM | POA: Diagnosis not present

## 2014-11-13 DIAGNOSIS — G4700 Insomnia, unspecified: Secondary | ICD-10-CM | POA: Diagnosis not present

## 2014-11-13 DIAGNOSIS — M542 Cervicalgia: Secondary | ICD-10-CM | POA: Diagnosis not present

## 2014-11-13 DIAGNOSIS — I10 Essential (primary) hypertension: Secondary | ICD-10-CM | POA: Diagnosis not present

## 2014-11-13 DIAGNOSIS — G454 Transient global amnesia: Secondary | ICD-10-CM | POA: Diagnosis not present

## 2014-11-13 DIAGNOSIS — Z1389 Encounter for screening for other disorder: Secondary | ICD-10-CM | POA: Diagnosis not present

## 2014-11-13 DIAGNOSIS — E780 Pure hypercholesterolemia: Secondary | ICD-10-CM | POA: Diagnosis not present

## 2014-11-13 DIAGNOSIS — Z23 Encounter for immunization: Secondary | ICD-10-CM | POA: Diagnosis not present

## 2014-12-21 DIAGNOSIS — R350 Frequency of micturition: Secondary | ICD-10-CM | POA: Diagnosis not present

## 2015-02-01 ENCOUNTER — Emergency Department (HOSPITAL_BASED_OUTPATIENT_CLINIC_OR_DEPARTMENT_OTHER): Payer: Commercial Managed Care - HMO

## 2015-02-01 ENCOUNTER — Encounter (HOSPITAL_BASED_OUTPATIENT_CLINIC_OR_DEPARTMENT_OTHER): Payer: Self-pay | Admitting: *Deleted

## 2015-02-01 ENCOUNTER — Emergency Department (HOSPITAL_BASED_OUTPATIENT_CLINIC_OR_DEPARTMENT_OTHER)
Admission: EM | Admit: 2015-02-01 | Discharge: 2015-02-01 | Disposition: A | Discharge status: Home / Self Care | Payer: Commercial Managed Care - HMO | Attending: Emergency Medicine | Admitting: Emergency Medicine

## 2015-02-01 DIAGNOSIS — S301XXA Contusion of abdominal wall, initial encounter: Secondary | ICD-10-CM | POA: Insufficient documentation

## 2015-02-01 DIAGNOSIS — S20219A Contusion of unspecified front wall of thorax, initial encounter: Secondary | ICD-10-CM | POA: Diagnosis not present

## 2015-02-01 DIAGNOSIS — Y92410 Unspecified street and highway as the place of occurrence of the external cause: Secondary | ICD-10-CM | POA: Insufficient documentation

## 2015-02-01 DIAGNOSIS — S299XXA Unspecified injury of thorax, initial encounter: Secondary | ICD-10-CM | POA: Diagnosis not present

## 2015-02-01 DIAGNOSIS — I10 Essential (primary) hypertension: Secondary | ICD-10-CM | POA: Diagnosis not present

## 2015-02-01 DIAGNOSIS — S3992XA Unspecified injury of lower back, initial encounter: Secondary | ICD-10-CM | POA: Insufficient documentation

## 2015-02-01 DIAGNOSIS — Z872 Personal history of diseases of the skin and subcutaneous tissue: Secondary | ICD-10-CM | POA: Insufficient documentation

## 2015-02-01 DIAGNOSIS — Y9389 Activity, other specified: Secondary | ICD-10-CM | POA: Diagnosis not present

## 2015-02-01 DIAGNOSIS — Z7951 Long term (current) use of inhaled steroids: Secondary | ICD-10-CM | POA: Diagnosis not present

## 2015-02-01 DIAGNOSIS — Z471 Aftercare following joint replacement surgery: Secondary | ICD-10-CM | POA: Diagnosis not present

## 2015-02-01 DIAGNOSIS — E7800 Pure hypercholesterolemia, unspecified: Secondary | ICD-10-CM | POA: Diagnosis not present

## 2015-02-01 DIAGNOSIS — M1990 Unspecified osteoarthritis, unspecified site: Secondary | ICD-10-CM | POA: Diagnosis not present

## 2015-02-01 DIAGNOSIS — Y998 Other external cause status: Secondary | ICD-10-CM | POA: Diagnosis not present

## 2015-02-01 DIAGNOSIS — M542 Cervicalgia: Secondary | ICD-10-CM | POA: Diagnosis not present

## 2015-02-01 DIAGNOSIS — Z87440 Personal history of urinary (tract) infections: Secondary | ICD-10-CM | POA: Diagnosis not present

## 2015-02-01 DIAGNOSIS — E669 Obesity, unspecified: Secondary | ICD-10-CM | POA: Diagnosis not present

## 2015-02-01 DIAGNOSIS — S161XXA Strain of muscle, fascia and tendon at neck level, initial encounter: Secondary | ICD-10-CM | POA: Diagnosis not present

## 2015-02-01 DIAGNOSIS — M7989 Other specified soft tissue disorders: Secondary | ICD-10-CM | POA: Diagnosis not present

## 2015-02-01 DIAGNOSIS — G8929 Other chronic pain: Secondary | ICD-10-CM | POA: Insufficient documentation

## 2015-02-01 DIAGNOSIS — Z791 Long term (current) use of non-steroidal anti-inflammatories (NSAID): Secondary | ICD-10-CM | POA: Insufficient documentation

## 2015-02-01 DIAGNOSIS — S199XXA Unspecified injury of neck, initial encounter: Secondary | ICD-10-CM | POA: Diagnosis not present

## 2015-02-01 DIAGNOSIS — S8001XA Contusion of right knee, initial encounter: Secondary | ICD-10-CM | POA: Diagnosis not present

## 2015-02-01 DIAGNOSIS — Z7982 Long term (current) use of aspirin: Secondary | ICD-10-CM | POA: Diagnosis not present

## 2015-02-01 DIAGNOSIS — N644 Mastodynia: Secondary | ICD-10-CM | POA: Diagnosis not present

## 2015-02-01 DIAGNOSIS — Z79899 Other long term (current) drug therapy: Secondary | ICD-10-CM | POA: Diagnosis not present

## 2015-02-01 DIAGNOSIS — R0789 Other chest pain: Secondary | ICD-10-CM | POA: Diagnosis not present

## 2015-02-01 DIAGNOSIS — Z96651 Presence of right artificial knee joint: Secondary | ICD-10-CM | POA: Diagnosis not present

## 2015-02-01 LAB — CBC WITH DIFFERENTIAL/PLATELET
Basophils Absolute: 0 10*3/uL (ref 0.0–0.1)
Basophils Relative: 0 %
Eosinophils Absolute: 0.2 10*3/uL (ref 0.0–0.7)
Eosinophils Relative: 2 %
HCT: 42 % (ref 36.0–46.0)
Hemoglobin: 13.7 g/dL (ref 12.0–15.0)
Lymphocytes Relative: 18 %
Lymphs Abs: 1.8 10*3/uL (ref 0.7–4.0)
MCH: 29.7 pg (ref 26.0–34.0)
MCHC: 32.6 g/dL (ref 30.0–36.0)
MCV: 91.1 fL (ref 78.0–100.0)
Monocytes Absolute: 0.9 10*3/uL (ref 0.1–1.0)
Monocytes Relative: 8 %
Neutro Abs: 7.3 10*3/uL (ref 1.7–7.7)
Neutrophils Relative %: 72 %
Platelets: 215 10*3/uL (ref 150–400)
RBC: 4.61 MIL/uL (ref 3.87–5.11)
RDW: 13 % (ref 11.5–15.5)
WBC: 10.2 10*3/uL (ref 4.0–10.5)

## 2015-02-01 LAB — COMPREHENSIVE METABOLIC PANEL
ALT: 13 U/L — ABNORMAL LOW (ref 14–54)
AST: 22 U/L (ref 15–41)
Albumin: 3.8 g/dL (ref 3.5–5.0)
Alkaline Phosphatase: 54 U/L (ref 38–126)
Anion gap: 7 (ref 5–15)
BUN: 17 mg/dL (ref 6–20)
CO2: 33 mmol/L — ABNORMAL HIGH (ref 22–32)
Calcium: 9.6 mg/dL (ref 8.9–10.3)
Chloride: 98 mmol/L — ABNORMAL LOW (ref 101–111)
Creatinine, Ser: 0.79 mg/dL (ref 0.44–1.00)
GFR calc Af Amer: >60 mL/min (ref >60)
GFR calc non Af Amer: >60 mL/min (ref >60)
Glucose, Bld: 109 mg/dL — ABNORMAL HIGH (ref 65–99)
Potassium: 3 mmol/L — ABNORMAL LOW (ref 3.5–5.1)
Sodium: 138 mmol/L (ref 135–145)
Total Bilirubin: 1.2 mg/dL (ref 0.3–1.2)
Total Protein: 7.1 g/dL (ref 6.5–8.1)

## 2015-02-01 LAB — TROPONIN I: Troponin I: <0.03 ng/mL (ref <0.031)
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
## 2015-02-01 MED ORDER — POTASSIUM CHLORIDE CRYS ER 20 MEQ PO TBCR
40.0000 meq | EXTENDED_RELEASE_TABLET | Freq: Once | ORAL | Status: AC
  Administered 2015-02-01: 40 meq via ORAL
  Filled 2015-02-01: qty 2

## 2015-02-01 MED ORDER — METHOCARBAMOL 500 MG PO TABS: 500.0000 mg | ORAL_TABLET | Freq: Three times a day (TID) | ORAL | Status: DC | PRN

## 2015-02-01 MED ORDER — TRAMADOL HCL 50 MG PO TABS: 50.0000 mg | ORAL_TABLET | Freq: Four times a day (QID) | ORAL | Status: DC | PRN

## 2015-02-01 MED ORDER — HYDROCODONE-ACETAMINOPHEN 5-325 MG PO TABS
1.0000 | ORAL_TABLET | Freq: Once | ORAL | Status: AC
  Administered 2015-02-01: 1 via ORAL
  Filled 2015-02-01: qty 1

## 2015-02-01 NOTE — ED Notes (Signed)
Patient gone to X ray when I went to go get EKG.

## 2015-02-01 NOTE — ED Notes (Signed)
Patient transported to X-ray/ CT via stretcher, sr x 2 up
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
## 2015-02-01 NOTE — ED Notes (Signed)
Large bruise noted at rt breast, large bruise also noted on rt knee

## 2015-02-01 NOTE — ED Notes (Signed)
States took one (1) tramadol this am approx 9am for pain relief
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
## 2015-02-27 ENCOUNTER — Other Ambulatory Visit: Payer: Self-pay | Admitting: Internal Medicine

## 2015-02-27 ENCOUNTER — Ambulatory Visit
Admission: RE | Admit: 2015-02-27 | Discharge: 2015-02-27 | Disposition: A | Discharge status: Home / Self Care | Payer: Medicare HMO | Source: Ambulatory Visit | Attending: Internal Medicine | Admitting: Internal Medicine

## 2015-02-27 DIAGNOSIS — R634 Abnormal weight loss: Secondary | ICD-10-CM | POA: Diagnosis not present

## 2015-02-27 DIAGNOSIS — R61 Generalized hyperhidrosis: Secondary | ICD-10-CM

## 2015-02-27 DIAGNOSIS — F439 Reaction to severe stress, unspecified: Secondary | ICD-10-CM | POA: Diagnosis not present

## 2015-02-27 DIAGNOSIS — R630 Anorexia: Secondary | ICD-10-CM | POA: Diagnosis not present

## 2015-02-27 DIAGNOSIS — G4700 Insomnia, unspecified: Secondary | ICD-10-CM | POA: Diagnosis not present

## 2015-03-14 DIAGNOSIS — N8111 Cystocele, midline: Secondary | ICD-10-CM | POA: Diagnosis not present

## 2015-03-14 DIAGNOSIS — N952 Postmenopausal atrophic vaginitis: Secondary | ICD-10-CM | POA: Diagnosis not present

## 2015-03-14 DIAGNOSIS — Z0000 Encounter for general adult medical examination without abnormal findings: Secondary | ICD-10-CM | POA: Diagnosis not present

## 2015-03-14 DIAGNOSIS — N3000 Acute cystitis without hematuria: Secondary | ICD-10-CM | POA: Diagnosis not present

## 2015-03-23 DIAGNOSIS — N952 Postmenopausal atrophic vaginitis: Secondary | ICD-10-CM | POA: Diagnosis not present

## 2015-03-27 DIAGNOSIS — N390 Urinary tract infection, site not specified: Secondary | ICD-10-CM | POA: Diagnosis not present

## 2015-03-27 DIAGNOSIS — R634 Abnormal weight loss: Secondary | ICD-10-CM | POA: Diagnosis not present

## 2015-03-27 DIAGNOSIS — R61 Generalized hyperhidrosis: Secondary | ICD-10-CM | POA: Diagnosis not present

## 2015-03-27 DIAGNOSIS — G4700 Insomnia, unspecified: Secondary | ICD-10-CM | POA: Diagnosis not present

## 2015-03-27 DIAGNOSIS — F439 Reaction to severe stress, unspecified: Secondary | ICD-10-CM | POA: Diagnosis not present

## 2015-03-27 DIAGNOSIS — R630 Anorexia: Secondary | ICD-10-CM | POA: Diagnosis not present

## 2015-04-04 DIAGNOSIS — E669 Obesity, unspecified: Secondary | ICD-10-CM | POA: Diagnosis not present

## 2015-04-04 DIAGNOSIS — F329 Major depressive disorder, single episode, unspecified: Secondary | ICD-10-CM | POA: Diagnosis not present

## 2015-04-04 DIAGNOSIS — G8929 Other chronic pain: Secondary | ICD-10-CM | POA: Diagnosis not present

## 2015-04-04 DIAGNOSIS — I10 Essential (primary) hypertension: Secondary | ICD-10-CM | POA: Diagnosis not present

## 2015-04-19 DIAGNOSIS — F329 Major depressive disorder, single episode, unspecified: Secondary | ICD-10-CM | POA: Diagnosis not present

## 2015-04-19 DIAGNOSIS — E669 Obesity, unspecified: Secondary | ICD-10-CM | POA: Diagnosis not present

## 2015-04-19 DIAGNOSIS — G8929 Other chronic pain: Secondary | ICD-10-CM | POA: Diagnosis not present

## 2015-04-19 DIAGNOSIS — I10 Essential (primary) hypertension: Secondary | ICD-10-CM | POA: Diagnosis not present

## 2015-04-25 DIAGNOSIS — I10 Essential (primary) hypertension: Secondary | ICD-10-CM | POA: Diagnosis not present

## 2015-04-25 DIAGNOSIS — E669 Obesity, unspecified: Secondary | ICD-10-CM | POA: Diagnosis not present

## 2015-04-25 DIAGNOSIS — G8929 Other chronic pain: Secondary | ICD-10-CM | POA: Diagnosis not present

## 2015-04-25 DIAGNOSIS — F329 Major depressive disorder, single episode, unspecified: Secondary | ICD-10-CM | POA: Diagnosis not present

## 2015-05-10 DIAGNOSIS — E669 Obesity, unspecified: Secondary | ICD-10-CM | POA: Diagnosis not present

## 2015-05-10 DIAGNOSIS — G8929 Other chronic pain: Secondary | ICD-10-CM | POA: Diagnosis not present

## 2015-05-10 DIAGNOSIS — F329 Major depressive disorder, single episode, unspecified: Secondary | ICD-10-CM | POA: Diagnosis not present

## 2015-05-10 DIAGNOSIS — I10 Essential (primary) hypertension: Secondary | ICD-10-CM | POA: Diagnosis not present

## 2015-05-25 DIAGNOSIS — N8111 Cystocele, midline: Secondary | ICD-10-CM | POA: Diagnosis not present

## 2015-05-25 DIAGNOSIS — Z0000 Encounter for general adult medical examination without abnormal findings: Secondary | ICD-10-CM | POA: Diagnosis not present

## 2015-05-26 DEATH — deceased

## 2015-05-30 DIAGNOSIS — G8929 Other chronic pain: Secondary | ICD-10-CM | POA: Diagnosis not present

## 2015-05-30 DIAGNOSIS — E669 Obesity, unspecified: Secondary | ICD-10-CM | POA: Diagnosis not present

## 2015-05-30 DIAGNOSIS — F329 Major depressive disorder, single episode, unspecified: Secondary | ICD-10-CM | POA: Diagnosis not present

## 2015-05-30 DIAGNOSIS — I10 Essential (primary) hypertension: Secondary | ICD-10-CM | POA: Diagnosis not present

## 2015-06-01 DIAGNOSIS — J3489 Other specified disorders of nose and nasal sinuses: Secondary | ICD-10-CM | POA: Diagnosis not present

## 2015-06-01 DIAGNOSIS — R040 Epistaxis: Secondary | ICD-10-CM | POA: Diagnosis not present

## 2015-07-06 DIAGNOSIS — R32 Unspecified urinary incontinence: Secondary | ICD-10-CM | POA: Diagnosis not present

## 2015-07-06 DIAGNOSIS — Z9181 History of falling: Secondary | ICD-10-CM | POA: Diagnosis not present

## 2015-07-06 DIAGNOSIS — R262 Difficulty in walking, not elsewhere classified: Secondary | ICD-10-CM | POA: Diagnosis not present

## 2015-07-06 DIAGNOSIS — R260 Ataxic gait: Secondary | ICD-10-CM | POA: Diagnosis not present

## 2015-07-06 DIAGNOSIS — M6281 Muscle weakness (generalized): Secondary | ICD-10-CM | POA: Diagnosis not present

## 2015-07-09 DIAGNOSIS — R32 Unspecified urinary incontinence: Secondary | ICD-10-CM | POA: Diagnosis not present

## 2015-07-09 DIAGNOSIS — R260 Ataxic gait: Secondary | ICD-10-CM | POA: Diagnosis not present

## 2015-07-09 DIAGNOSIS — Z9181 History of falling: Secondary | ICD-10-CM | POA: Diagnosis not present

## 2015-07-09 DIAGNOSIS — M6281 Muscle weakness (generalized): Secondary | ICD-10-CM | POA: Diagnosis not present

## 2015-07-09 DIAGNOSIS — R262 Difficulty in walking, not elsewhere classified: Secondary | ICD-10-CM | POA: Diagnosis not present

## 2015-07-12 DIAGNOSIS — M6281 Muscle weakness (generalized): Secondary | ICD-10-CM | POA: Diagnosis not present

## 2015-07-12 DIAGNOSIS — R262 Difficulty in walking, not elsewhere classified: Secondary | ICD-10-CM | POA: Diagnosis not present

## 2015-07-12 DIAGNOSIS — R32 Unspecified urinary incontinence: Secondary | ICD-10-CM | POA: Diagnosis not present

## 2015-07-12 DIAGNOSIS — R260 Ataxic gait: Secondary | ICD-10-CM | POA: Diagnosis not present

## 2015-07-12 DIAGNOSIS — Z9181 History of falling: Secondary | ICD-10-CM | POA: Diagnosis not present

## 2015-07-13 DIAGNOSIS — R32 Unspecified urinary incontinence: Secondary | ICD-10-CM | POA: Diagnosis not present

## 2015-07-13 DIAGNOSIS — M6281 Muscle weakness (generalized): Secondary | ICD-10-CM | POA: Diagnosis not present

## 2015-07-13 DIAGNOSIS — R260 Ataxic gait: Secondary | ICD-10-CM | POA: Diagnosis not present

## 2015-07-13 DIAGNOSIS — R262 Difficulty in walking, not elsewhere classified: Secondary | ICD-10-CM | POA: Diagnosis not present

## 2015-07-13 DIAGNOSIS — Z9181 History of falling: Secondary | ICD-10-CM | POA: Diagnosis not present

## 2015-07-14 DIAGNOSIS — M6281 Muscle weakness (generalized): Secondary | ICD-10-CM | POA: Diagnosis not present

## 2015-07-14 DIAGNOSIS — R262 Difficulty in walking, not elsewhere classified: Secondary | ICD-10-CM | POA: Diagnosis not present

## 2015-07-14 DIAGNOSIS — Z9181 History of falling: Secondary | ICD-10-CM | POA: Diagnosis not present

## 2015-07-14 DIAGNOSIS — R32 Unspecified urinary incontinence: Secondary | ICD-10-CM | POA: Diagnosis not present

## 2015-07-14 DIAGNOSIS — R260 Ataxic gait: Secondary | ICD-10-CM | POA: Diagnosis not present

## 2015-07-16 DIAGNOSIS — R260 Ataxic gait: Secondary | ICD-10-CM | POA: Diagnosis not present

## 2015-07-16 DIAGNOSIS — R32 Unspecified urinary incontinence: Secondary | ICD-10-CM | POA: Diagnosis not present

## 2015-07-16 DIAGNOSIS — M6281 Muscle weakness (generalized): Secondary | ICD-10-CM | POA: Diagnosis not present

## 2015-07-16 DIAGNOSIS — R262 Difficulty in walking, not elsewhere classified: Secondary | ICD-10-CM | POA: Diagnosis not present

## 2015-07-16 DIAGNOSIS — Z9181 History of falling: Secondary | ICD-10-CM | POA: Diagnosis not present

## 2015-07-17 DIAGNOSIS — R260 Ataxic gait: Secondary | ICD-10-CM | POA: Diagnosis not present

## 2015-07-17 DIAGNOSIS — R262 Difficulty in walking, not elsewhere classified: Secondary | ICD-10-CM | POA: Diagnosis not present

## 2015-07-17 DIAGNOSIS — R32 Unspecified urinary incontinence: Secondary | ICD-10-CM | POA: Diagnosis not present

## 2015-07-17 DIAGNOSIS — M6281 Muscle weakness (generalized): Secondary | ICD-10-CM | POA: Diagnosis not present

## 2015-07-17 DIAGNOSIS — Z9181 History of falling: Secondary | ICD-10-CM | POA: Diagnosis not present

## 2015-07-18 DIAGNOSIS — R260 Ataxic gait: Secondary | ICD-10-CM | POA: Diagnosis not present

## 2015-07-18 DIAGNOSIS — R262 Difficulty in walking, not elsewhere classified: Secondary | ICD-10-CM | POA: Diagnosis not present

## 2015-07-18 DIAGNOSIS — R32 Unspecified urinary incontinence: Secondary | ICD-10-CM | POA: Diagnosis not present

## 2015-07-18 DIAGNOSIS — M6281 Muscle weakness (generalized): Secondary | ICD-10-CM | POA: Diagnosis not present

## 2015-07-18 DIAGNOSIS — Z9181 History of falling: Secondary | ICD-10-CM | POA: Diagnosis not present

## 2015-07-19 DIAGNOSIS — R262 Difficulty in walking, not elsewhere classified: Secondary | ICD-10-CM | POA: Diagnosis not present

## 2015-07-19 DIAGNOSIS — Z9181 History of falling: Secondary | ICD-10-CM | POA: Diagnosis not present

## 2015-07-19 DIAGNOSIS — M6281 Muscle weakness (generalized): Secondary | ICD-10-CM | POA: Diagnosis not present

## 2015-07-19 DIAGNOSIS — R260 Ataxic gait: Secondary | ICD-10-CM | POA: Diagnosis not present

## 2015-07-19 DIAGNOSIS — R32 Unspecified urinary incontinence: Secondary | ICD-10-CM | POA: Diagnosis not present

## 2015-07-20 DIAGNOSIS — R32 Unspecified urinary incontinence: Secondary | ICD-10-CM | POA: Diagnosis not present

## 2015-07-20 DIAGNOSIS — R260 Ataxic gait: Secondary | ICD-10-CM | POA: Diagnosis not present

## 2015-07-20 DIAGNOSIS — R262 Difficulty in walking, not elsewhere classified: Secondary | ICD-10-CM | POA: Diagnosis not present

## 2015-07-20 DIAGNOSIS — M6281 Muscle weakness (generalized): Secondary | ICD-10-CM | POA: Diagnosis not present

## 2015-07-20 DIAGNOSIS — Z9181 History of falling: Secondary | ICD-10-CM | POA: Diagnosis not present

## 2015-07-24 DIAGNOSIS — R32 Unspecified urinary incontinence: Secondary | ICD-10-CM | POA: Diagnosis not present

## 2015-07-24 DIAGNOSIS — M6281 Muscle weakness (generalized): Secondary | ICD-10-CM | POA: Diagnosis not present

## 2015-07-24 DIAGNOSIS — R260 Ataxic gait: Secondary | ICD-10-CM | POA: Diagnosis not present

## 2015-07-24 DIAGNOSIS — Z9181 History of falling: Secondary | ICD-10-CM | POA: Diagnosis not present

## 2015-07-24 DIAGNOSIS — R262 Difficulty in walking, not elsewhere classified: Secondary | ICD-10-CM | POA: Diagnosis not present

## 2015-07-25 DIAGNOSIS — R260 Ataxic gait: Secondary | ICD-10-CM | POA: Diagnosis not present

## 2015-07-25 DIAGNOSIS — R32 Unspecified urinary incontinence: Secondary | ICD-10-CM | POA: Diagnosis not present

## 2015-07-25 DIAGNOSIS — Z9181 History of falling: Secondary | ICD-10-CM | POA: Diagnosis not present

## 2015-07-25 DIAGNOSIS — M6281 Muscle weakness (generalized): Secondary | ICD-10-CM | POA: Diagnosis not present

## 2015-07-25 DIAGNOSIS — R262 Difficulty in walking, not elsewhere classified: Secondary | ICD-10-CM | POA: Diagnosis not present

## 2015-07-26 DIAGNOSIS — R260 Ataxic gait: Secondary | ICD-10-CM | POA: Diagnosis not present

## 2015-07-26 DIAGNOSIS — R262 Difficulty in walking, not elsewhere classified: Secondary | ICD-10-CM | POA: Diagnosis not present

## 2015-07-26 DIAGNOSIS — Z9181 History of falling: Secondary | ICD-10-CM | POA: Diagnosis not present

## 2015-07-26 DIAGNOSIS — R32 Unspecified urinary incontinence: Secondary | ICD-10-CM | POA: Diagnosis not present

## 2015-07-26 DIAGNOSIS — M6281 Muscle weakness (generalized): Secondary | ICD-10-CM | POA: Diagnosis not present

## 2015-07-27 DIAGNOSIS — R260 Ataxic gait: Secondary | ICD-10-CM | POA: Diagnosis not present

## 2015-07-27 DIAGNOSIS — R262 Difficulty in walking, not elsewhere classified: Secondary | ICD-10-CM | POA: Diagnosis not present

## 2015-07-27 DIAGNOSIS — M6281 Muscle weakness (generalized): Secondary | ICD-10-CM | POA: Diagnosis not present

## 2015-07-27 DIAGNOSIS — R32 Unspecified urinary incontinence: Secondary | ICD-10-CM | POA: Diagnosis not present

## 2015-07-27 DIAGNOSIS — Z9181 History of falling: Secondary | ICD-10-CM | POA: Diagnosis not present

## 2015-07-31 DIAGNOSIS — M6281 Muscle weakness (generalized): Secondary | ICD-10-CM | POA: Diagnosis not present

## 2015-07-31 DIAGNOSIS — G4700 Insomnia, unspecified: Secondary | ICD-10-CM | POA: Diagnosis not present

## 2015-07-31 DIAGNOSIS — Z1231 Encounter for screening mammogram for malignant neoplasm of breast: Secondary | ICD-10-CM | POA: Diagnosis not present

## 2015-07-31 DIAGNOSIS — Z9181 History of falling: Secondary | ICD-10-CM | POA: Diagnosis not present

## 2015-07-31 DIAGNOSIS — R05 Cough: Secondary | ICD-10-CM | POA: Diagnosis not present

## 2015-07-31 DIAGNOSIS — R634 Abnormal weight loss: Secondary | ICD-10-CM | POA: Diagnosis not present

## 2015-07-31 DIAGNOSIS — F439 Reaction to severe stress, unspecified: Secondary | ICD-10-CM | POA: Diagnosis not present

## 2015-07-31 DIAGNOSIS — R260 Ataxic gait: Secondary | ICD-10-CM | POA: Diagnosis not present

## 2015-07-31 DIAGNOSIS — R32 Unspecified urinary incontinence: Secondary | ICD-10-CM | POA: Diagnosis not present

## 2015-07-31 DIAGNOSIS — R262 Difficulty in walking, not elsewhere classified: Secondary | ICD-10-CM | POA: Diagnosis not present

## 2015-07-31 DIAGNOSIS — R630 Anorexia: Secondary | ICD-10-CM | POA: Diagnosis not present

## 2015-08-01 DIAGNOSIS — Z9181 History of falling: Secondary | ICD-10-CM | POA: Diagnosis not present

## 2015-08-01 DIAGNOSIS — M6281 Muscle weakness (generalized): Secondary | ICD-10-CM | POA: Diagnosis not present

## 2015-08-01 DIAGNOSIS — R32 Unspecified urinary incontinence: Secondary | ICD-10-CM | POA: Diagnosis not present

## 2015-08-01 DIAGNOSIS — R260 Ataxic gait: Secondary | ICD-10-CM | POA: Diagnosis not present

## 2015-08-01 DIAGNOSIS — R262 Difficulty in walking, not elsewhere classified: Secondary | ICD-10-CM | POA: Diagnosis not present

## 2015-08-03 DIAGNOSIS — M6281 Muscle weakness (generalized): Secondary | ICD-10-CM | POA: Diagnosis not present

## 2015-08-03 DIAGNOSIS — Z9181 History of falling: Secondary | ICD-10-CM | POA: Diagnosis not present

## 2015-08-03 DIAGNOSIS — R262 Difficulty in walking, not elsewhere classified: Secondary | ICD-10-CM | POA: Diagnosis not present

## 2015-08-03 DIAGNOSIS — R32 Unspecified urinary incontinence: Secondary | ICD-10-CM | POA: Diagnosis not present

## 2015-08-03 DIAGNOSIS — R260 Ataxic gait: Secondary | ICD-10-CM | POA: Diagnosis not present

## 2015-08-07 DIAGNOSIS — R32 Unspecified urinary incontinence: Secondary | ICD-10-CM | POA: Diagnosis not present

## 2015-08-07 DIAGNOSIS — R262 Difficulty in walking, not elsewhere classified: Secondary | ICD-10-CM | POA: Diagnosis not present

## 2015-08-07 DIAGNOSIS — R260 Ataxic gait: Secondary | ICD-10-CM | POA: Diagnosis not present

## 2015-08-07 DIAGNOSIS — Z9181 History of falling: Secondary | ICD-10-CM | POA: Diagnosis not present

## 2015-08-07 DIAGNOSIS — M6281 Muscle weakness (generalized): Secondary | ICD-10-CM | POA: Diagnosis not present

## 2015-08-08 DIAGNOSIS — M6281 Muscle weakness (generalized): Secondary | ICD-10-CM | POA: Diagnosis not present

## 2015-08-08 DIAGNOSIS — Z9181 History of falling: Secondary | ICD-10-CM | POA: Diagnosis not present

## 2015-08-08 DIAGNOSIS — R32 Unspecified urinary incontinence: Secondary | ICD-10-CM | POA: Diagnosis not present

## 2015-08-08 DIAGNOSIS — R262 Difficulty in walking, not elsewhere classified: Secondary | ICD-10-CM | POA: Diagnosis not present

## 2015-08-08 DIAGNOSIS — R260 Ataxic gait: Secondary | ICD-10-CM | POA: Diagnosis not present

## 2015-08-09 DIAGNOSIS — R260 Ataxic gait: Secondary | ICD-10-CM | POA: Diagnosis not present

## 2015-08-09 DIAGNOSIS — R32 Unspecified urinary incontinence: Secondary | ICD-10-CM | POA: Diagnosis not present

## 2015-08-09 DIAGNOSIS — R262 Difficulty in walking, not elsewhere classified: Secondary | ICD-10-CM | POA: Diagnosis not present

## 2015-08-09 DIAGNOSIS — M6281 Muscle weakness (generalized): Secondary | ICD-10-CM | POA: Diagnosis not present

## 2015-08-09 DIAGNOSIS — Z9181 History of falling: Secondary | ICD-10-CM | POA: Diagnosis not present

## 2015-08-10 DIAGNOSIS — R262 Difficulty in walking, not elsewhere classified: Secondary | ICD-10-CM | POA: Diagnosis not present

## 2015-08-10 DIAGNOSIS — M6281 Muscle weakness (generalized): Secondary | ICD-10-CM | POA: Diagnosis not present

## 2015-08-10 DIAGNOSIS — Z9181 History of falling: Secondary | ICD-10-CM | POA: Diagnosis not present

## 2015-08-10 DIAGNOSIS — R260 Ataxic gait: Secondary | ICD-10-CM | POA: Diagnosis not present

## 2015-08-10 DIAGNOSIS — R32 Unspecified urinary incontinence: Secondary | ICD-10-CM | POA: Diagnosis not present

## 2015-08-14 DIAGNOSIS — Z9181 History of falling: Secondary | ICD-10-CM | POA: Diagnosis not present

## 2015-08-14 DIAGNOSIS — R262 Difficulty in walking, not elsewhere classified: Secondary | ICD-10-CM | POA: Diagnosis not present

## 2015-08-14 DIAGNOSIS — R260 Ataxic gait: Secondary | ICD-10-CM | POA: Diagnosis not present

## 2015-08-14 DIAGNOSIS — R32 Unspecified urinary incontinence: Secondary | ICD-10-CM | POA: Diagnosis not present

## 2015-08-14 DIAGNOSIS — M6281 Muscle weakness (generalized): Secondary | ICD-10-CM | POA: Diagnosis not present

## 2015-08-15 DIAGNOSIS — Z9181 History of falling: Secondary | ICD-10-CM | POA: Diagnosis not present

## 2015-08-15 DIAGNOSIS — R32 Unspecified urinary incontinence: Secondary | ICD-10-CM | POA: Diagnosis not present

## 2015-08-15 DIAGNOSIS — R260 Ataxic gait: Secondary | ICD-10-CM | POA: Diagnosis not present

## 2015-08-15 DIAGNOSIS — M6281 Muscle weakness (generalized): Secondary | ICD-10-CM | POA: Diagnosis not present

## 2015-08-15 DIAGNOSIS — R262 Difficulty in walking, not elsewhere classified: Secondary | ICD-10-CM | POA: Diagnosis not present

## 2015-08-17 DIAGNOSIS — M6281 Muscle weakness (generalized): Secondary | ICD-10-CM | POA: Diagnosis not present

## 2015-08-17 DIAGNOSIS — Z9181 History of falling: Secondary | ICD-10-CM | POA: Diagnosis not present

## 2015-08-17 DIAGNOSIS — R260 Ataxic gait: Secondary | ICD-10-CM | POA: Diagnosis not present

## 2015-08-17 DIAGNOSIS — R262 Difficulty in walking, not elsewhere classified: Secondary | ICD-10-CM | POA: Diagnosis not present

## 2015-08-17 DIAGNOSIS — R32 Unspecified urinary incontinence: Secondary | ICD-10-CM | POA: Diagnosis not present

## 2015-08-21 DIAGNOSIS — Z9181 History of falling: Secondary | ICD-10-CM | POA: Diagnosis not present

## 2015-08-21 DIAGNOSIS — M6281 Muscle weakness (generalized): Secondary | ICD-10-CM | POA: Diagnosis not present

## 2015-08-21 DIAGNOSIS — R32 Unspecified urinary incontinence: Secondary | ICD-10-CM | POA: Diagnosis not present

## 2015-08-21 DIAGNOSIS — R262 Difficulty in walking, not elsewhere classified: Secondary | ICD-10-CM | POA: Diagnosis not present

## 2015-08-21 DIAGNOSIS — R260 Ataxic gait: Secondary | ICD-10-CM | POA: Diagnosis not present

## 2015-08-22 DIAGNOSIS — N8111 Cystocele, midline: Secondary | ICD-10-CM | POA: Diagnosis not present

## 2015-08-23 DIAGNOSIS — M6281 Muscle weakness (generalized): Secondary | ICD-10-CM | POA: Diagnosis not present

## 2015-08-23 DIAGNOSIS — R32 Unspecified urinary incontinence: Secondary | ICD-10-CM | POA: Diagnosis not present

## 2015-08-23 DIAGNOSIS — R262 Difficulty in walking, not elsewhere classified: Secondary | ICD-10-CM | POA: Diagnosis not present

## 2015-08-23 DIAGNOSIS — Z9181 History of falling: Secondary | ICD-10-CM | POA: Diagnosis not present

## 2015-08-23 DIAGNOSIS — R260 Ataxic gait: Secondary | ICD-10-CM | POA: Diagnosis not present

## 2015-08-24 DIAGNOSIS — M6281 Muscle weakness (generalized): Secondary | ICD-10-CM | POA: Diagnosis not present

## 2015-08-24 DIAGNOSIS — R262 Difficulty in walking, not elsewhere classified: Secondary | ICD-10-CM | POA: Diagnosis not present

## 2015-08-24 DIAGNOSIS — R32 Unspecified urinary incontinence: Secondary | ICD-10-CM | POA: Diagnosis not present

## 2015-08-24 DIAGNOSIS — Z9181 History of falling: Secondary | ICD-10-CM | POA: Diagnosis not present

## 2015-08-24 DIAGNOSIS — R260 Ataxic gait: Secondary | ICD-10-CM | POA: Diagnosis not present

## 2015-08-27 DIAGNOSIS — R260 Ataxic gait: Secondary | ICD-10-CM | POA: Diagnosis not present

## 2015-08-27 DIAGNOSIS — R32 Unspecified urinary incontinence: Secondary | ICD-10-CM | POA: Diagnosis not present

## 2015-08-27 DIAGNOSIS — R262 Difficulty in walking, not elsewhere classified: Secondary | ICD-10-CM | POA: Diagnosis not present

## 2015-08-27 DIAGNOSIS — Z9181 History of falling: Secondary | ICD-10-CM | POA: Diagnosis not present

## 2015-08-27 DIAGNOSIS — M6281 Muscle weakness (generalized): Secondary | ICD-10-CM | POA: Diagnosis not present

## 2015-08-29 DIAGNOSIS — R260 Ataxic gait: Secondary | ICD-10-CM | POA: Diagnosis not present

## 2015-08-29 DIAGNOSIS — R262 Difficulty in walking, not elsewhere classified: Secondary | ICD-10-CM | POA: Diagnosis not present

## 2015-08-29 DIAGNOSIS — Z9181 History of falling: Secondary | ICD-10-CM | POA: Diagnosis not present

## 2015-08-29 DIAGNOSIS — M6281 Muscle weakness (generalized): Secondary | ICD-10-CM | POA: Diagnosis not present

## 2015-08-29 DIAGNOSIS — R32 Unspecified urinary incontinence: Secondary | ICD-10-CM | POA: Diagnosis not present

## 2015-08-30 DIAGNOSIS — Z9181 History of falling: Secondary | ICD-10-CM | POA: Diagnosis not present

## 2015-08-30 DIAGNOSIS — R260 Ataxic gait: Secondary | ICD-10-CM | POA: Diagnosis not present

## 2015-08-30 DIAGNOSIS — R32 Unspecified urinary incontinence: Secondary | ICD-10-CM | POA: Diagnosis not present

## 2015-08-30 DIAGNOSIS — M6281 Muscle weakness (generalized): Secondary | ICD-10-CM | POA: Diagnosis not present

## 2015-08-30 DIAGNOSIS — R262 Difficulty in walking, not elsewhere classified: Secondary | ICD-10-CM | POA: Diagnosis not present

## 2015-09-04 DIAGNOSIS — R260 Ataxic gait: Secondary | ICD-10-CM | POA: Diagnosis not present

## 2015-09-04 DIAGNOSIS — Z9181 History of falling: Secondary | ICD-10-CM | POA: Diagnosis not present

## 2015-09-04 DIAGNOSIS — M6281 Muscle weakness (generalized): Secondary | ICD-10-CM | POA: Diagnosis not present

## 2015-09-04 DIAGNOSIS — R262 Difficulty in walking, not elsewhere classified: Secondary | ICD-10-CM | POA: Diagnosis not present

## 2015-09-04 DIAGNOSIS — R32 Unspecified urinary incontinence: Secondary | ICD-10-CM | POA: Diagnosis not present

## 2015-09-06 DIAGNOSIS — R260 Ataxic gait: Secondary | ICD-10-CM | POA: Diagnosis not present

## 2015-09-06 DIAGNOSIS — R262 Difficulty in walking, not elsewhere classified: Secondary | ICD-10-CM | POA: Diagnosis not present

## 2015-09-06 DIAGNOSIS — M6281 Muscle weakness (generalized): Secondary | ICD-10-CM | POA: Diagnosis not present

## 2015-09-06 DIAGNOSIS — Z9181 History of falling: Secondary | ICD-10-CM | POA: Diagnosis not present

## 2015-09-06 DIAGNOSIS — R32 Unspecified urinary incontinence: Secondary | ICD-10-CM | POA: Diagnosis not present

## 2015-09-07 DIAGNOSIS — R32 Unspecified urinary incontinence: Secondary | ICD-10-CM | POA: Diagnosis not present

## 2015-09-07 DIAGNOSIS — Z9181 History of falling: Secondary | ICD-10-CM | POA: Diagnosis not present

## 2015-09-07 DIAGNOSIS — M6281 Muscle weakness (generalized): Secondary | ICD-10-CM | POA: Diagnosis not present

## 2015-09-07 DIAGNOSIS — R262 Difficulty in walking, not elsewhere classified: Secondary | ICD-10-CM | POA: Diagnosis not present

## 2015-09-07 DIAGNOSIS — R260 Ataxic gait: Secondary | ICD-10-CM | POA: Diagnosis not present

## 2015-09-10 DIAGNOSIS — M6281 Muscle weakness (generalized): Secondary | ICD-10-CM | POA: Diagnosis not present

## 2015-09-10 DIAGNOSIS — R260 Ataxic gait: Secondary | ICD-10-CM | POA: Diagnosis not present

## 2015-09-10 DIAGNOSIS — R262 Difficulty in walking, not elsewhere classified: Secondary | ICD-10-CM | POA: Diagnosis not present

## 2015-09-10 DIAGNOSIS — Z9181 History of falling: Secondary | ICD-10-CM | POA: Diagnosis not present

## 2015-09-10 DIAGNOSIS — R32 Unspecified urinary incontinence: Secondary | ICD-10-CM | POA: Diagnosis not present

## 2015-09-12 DIAGNOSIS — R32 Unspecified urinary incontinence: Secondary | ICD-10-CM | POA: Diagnosis not present

## 2015-09-12 DIAGNOSIS — Z9181 History of falling: Secondary | ICD-10-CM | POA: Diagnosis not present

## 2015-09-12 DIAGNOSIS — M6281 Muscle weakness (generalized): Secondary | ICD-10-CM | POA: Diagnosis not present

## 2015-09-12 DIAGNOSIS — R262 Difficulty in walking, not elsewhere classified: Secondary | ICD-10-CM | POA: Diagnosis not present

## 2015-09-12 DIAGNOSIS — R260 Ataxic gait: Secondary | ICD-10-CM | POA: Diagnosis not present

## 2015-09-13 DIAGNOSIS — M6281 Muscle weakness (generalized): Secondary | ICD-10-CM | POA: Diagnosis not present

## 2015-09-13 DIAGNOSIS — R260 Ataxic gait: Secondary | ICD-10-CM | POA: Diagnosis not present

## 2015-09-13 DIAGNOSIS — Z9181 History of falling: Secondary | ICD-10-CM | POA: Diagnosis not present

## 2015-09-13 DIAGNOSIS — R262 Difficulty in walking, not elsewhere classified: Secondary | ICD-10-CM | POA: Diagnosis not present

## 2015-09-13 DIAGNOSIS — R32 Unspecified urinary incontinence: Secondary | ICD-10-CM | POA: Diagnosis not present

## 2015-09-19 DIAGNOSIS — R262 Difficulty in walking, not elsewhere classified: Secondary | ICD-10-CM | POA: Diagnosis not present

## 2015-09-19 DIAGNOSIS — R260 Ataxic gait: Secondary | ICD-10-CM | POA: Diagnosis not present

## 2015-09-19 DIAGNOSIS — Z9181 History of falling: Secondary | ICD-10-CM | POA: Diagnosis not present

## 2015-09-19 DIAGNOSIS — R32 Unspecified urinary incontinence: Secondary | ICD-10-CM | POA: Diagnosis not present

## 2015-09-19 DIAGNOSIS — M6281 Muscle weakness (generalized): Secondary | ICD-10-CM | POA: Diagnosis not present

## 2015-09-20 DIAGNOSIS — Z9181 History of falling: Secondary | ICD-10-CM | POA: Diagnosis not present

## 2015-09-20 DIAGNOSIS — R262 Difficulty in walking, not elsewhere classified: Secondary | ICD-10-CM | POA: Diagnosis not present

## 2015-09-20 DIAGNOSIS — R32 Unspecified urinary incontinence: Secondary | ICD-10-CM | POA: Diagnosis not present

## 2015-09-20 DIAGNOSIS — R260 Ataxic gait: Secondary | ICD-10-CM | POA: Diagnosis not present

## 2015-09-20 DIAGNOSIS — M6281 Muscle weakness (generalized): Secondary | ICD-10-CM | POA: Diagnosis not present

## 2015-09-21 DIAGNOSIS — R260 Ataxic gait: Secondary | ICD-10-CM | POA: Diagnosis not present

## 2015-09-21 DIAGNOSIS — R262 Difficulty in walking, not elsewhere classified: Secondary | ICD-10-CM | POA: Diagnosis not present

## 2015-09-21 DIAGNOSIS — M6281 Muscle weakness (generalized): Secondary | ICD-10-CM | POA: Diagnosis not present

## 2015-09-21 DIAGNOSIS — R32 Unspecified urinary incontinence: Secondary | ICD-10-CM | POA: Diagnosis not present

## 2015-09-21 DIAGNOSIS — Z9181 History of falling: Secondary | ICD-10-CM | POA: Diagnosis not present

## 2015-09-24 DIAGNOSIS — R262 Difficulty in walking, not elsewhere classified: Secondary | ICD-10-CM | POA: Diagnosis not present

## 2015-09-24 DIAGNOSIS — R32 Unspecified urinary incontinence: Secondary | ICD-10-CM | POA: Diagnosis not present

## 2015-09-24 DIAGNOSIS — M6281 Muscle weakness (generalized): Secondary | ICD-10-CM | POA: Diagnosis not present

## 2015-09-24 DIAGNOSIS — Z9181 History of falling: Secondary | ICD-10-CM | POA: Diagnosis not present

## 2015-09-24 DIAGNOSIS — R260 Ataxic gait: Secondary | ICD-10-CM | POA: Diagnosis not present

## 2015-09-26 DIAGNOSIS — R262 Difficulty in walking, not elsewhere classified: Secondary | ICD-10-CM | POA: Diagnosis not present

## 2015-09-26 DIAGNOSIS — M6281 Muscle weakness (generalized): Secondary | ICD-10-CM | POA: Diagnosis not present

## 2015-09-26 DIAGNOSIS — Z9181 History of falling: Secondary | ICD-10-CM | POA: Diagnosis not present

## 2015-09-27 DIAGNOSIS — Z9181 History of falling: Secondary | ICD-10-CM | POA: Diagnosis not present

## 2015-09-27 DIAGNOSIS — M6281 Muscle weakness (generalized): Secondary | ICD-10-CM | POA: Diagnosis not present

## 2015-09-27 DIAGNOSIS — R262 Difficulty in walking, not elsewhere classified: Secondary | ICD-10-CM | POA: Diagnosis not present

## 2015-10-25 DIAGNOSIS — C44722 Squamous cell carcinoma of skin of right lower limb, including hip: Secondary | ICD-10-CM | POA: Diagnosis not present

## 2015-11-21 DIAGNOSIS — Z1389 Encounter for screening for other disorder: Secondary | ICD-10-CM | POA: Diagnosis not present

## 2015-11-21 DIAGNOSIS — Z0001 Encounter for general adult medical examination with abnormal findings: Secondary | ICD-10-CM | POA: Diagnosis not present

## 2015-11-21 DIAGNOSIS — E7800 Pure hypercholesterolemia, unspecified: Secondary | ICD-10-CM | POA: Diagnosis not present

## 2015-11-21 DIAGNOSIS — N3942 Incontinence without sensory awareness: Secondary | ICD-10-CM | POA: Diagnosis not present

## 2015-11-21 DIAGNOSIS — K5730 Diverticulosis of large intestine without perforation or abscess without bleeding: Secondary | ICD-10-CM | POA: Diagnosis not present

## 2015-11-21 DIAGNOSIS — J309 Allergic rhinitis, unspecified: Secondary | ICD-10-CM | POA: Diagnosis not present

## 2015-11-21 DIAGNOSIS — I10 Essential (primary) hypertension: Secondary | ICD-10-CM | POA: Diagnosis not present

## 2015-11-21 DIAGNOSIS — Z79899 Other long term (current) drug therapy: Secondary | ICD-10-CM | POA: Diagnosis not present

## 2015-11-21 DIAGNOSIS — E559 Vitamin D deficiency, unspecified: Secondary | ICD-10-CM | POA: Diagnosis not present

## 2015-12-12 DIAGNOSIS — M85831 Other specified disorders of bone density and structure, right forearm: Secondary | ICD-10-CM | POA: Diagnosis not present

## 2015-12-12 DIAGNOSIS — M8588 Other specified disorders of bone density and structure, other site: Secondary | ICD-10-CM | POA: Diagnosis not present

## 2015-12-24 DIAGNOSIS — N8111 Cystocele, midline: Secondary | ICD-10-CM | POA: Diagnosis not present

## 2016-01-24 DIAGNOSIS — L814 Other melanin hyperpigmentation: Secondary | ICD-10-CM | POA: Diagnosis not present

## 2016-01-24 DIAGNOSIS — Z85828 Personal history of other malignant neoplasm of skin: Secondary | ICD-10-CM | POA: Diagnosis not present

## 2016-01-24 DIAGNOSIS — Z08 Encounter for follow-up examination after completed treatment for malignant neoplasm: Secondary | ICD-10-CM | POA: Diagnosis not present

## 2016-01-24 DIAGNOSIS — D045 Carcinoma in situ of skin of trunk: Secondary | ICD-10-CM | POA: Diagnosis not present

## 2016-01-24 DIAGNOSIS — D492 Neoplasm of unspecified behavior of bone, soft tissue, and skin: Secondary | ICD-10-CM | POA: Diagnosis not present

## 2016-02-05 DIAGNOSIS — H903 Sensorineural hearing loss, bilateral: Secondary | ICD-10-CM | POA: Diagnosis not present

## 2016-02-11 DIAGNOSIS — C44529 Squamous cell carcinoma of skin of other part of trunk: Secondary | ICD-10-CM | POA: Diagnosis not present

## 2016-04-18 DIAGNOSIS — N8111 Cystocele, midline: Secondary | ICD-10-CM | POA: Diagnosis not present

## 2016-05-08 DIAGNOSIS — J45909 Unspecified asthma, uncomplicated: Secondary | ICD-10-CM | POA: Diagnosis not present

## 2016-05-08 DIAGNOSIS — J209 Acute bronchitis, unspecified: Secondary | ICD-10-CM | POA: Diagnosis not present

## 2016-05-15 DIAGNOSIS — L821 Other seborrheic keratosis: Secondary | ICD-10-CM | POA: Diagnosis not present

## 2016-05-15 DIAGNOSIS — L814 Other melanin hyperpigmentation: Secondary | ICD-10-CM | POA: Diagnosis not present

## 2016-05-15 DIAGNOSIS — Z85828 Personal history of other malignant neoplasm of skin: Secondary | ICD-10-CM | POA: Diagnosis not present

## 2016-05-15 DIAGNOSIS — Z08 Encounter for follow-up examination after completed treatment for malignant neoplasm: Secondary | ICD-10-CM | POA: Diagnosis not present

## 2016-05-27 DIAGNOSIS — M6281 Muscle weakness (generalized): Secondary | ICD-10-CM | POA: Diagnosis not present

## 2016-05-27 DIAGNOSIS — R262 Difficulty in walking, not elsewhere classified: Secondary | ICD-10-CM | POA: Diagnosis not present

## 2016-05-27 DIAGNOSIS — Z9181 History of falling: Secondary | ICD-10-CM | POA: Diagnosis not present

## 2016-05-28 DIAGNOSIS — Z9181 History of falling: Secondary | ICD-10-CM | POA: Diagnosis not present

## 2016-05-28 DIAGNOSIS — R262 Difficulty in walking, not elsewhere classified: Secondary | ICD-10-CM | POA: Diagnosis not present

## 2016-05-28 DIAGNOSIS — M6281 Muscle weakness (generalized): Secondary | ICD-10-CM | POA: Diagnosis not present

## 2016-05-30 DIAGNOSIS — R262 Difficulty in walking, not elsewhere classified: Secondary | ICD-10-CM | POA: Diagnosis not present

## 2016-05-30 DIAGNOSIS — Z9181 History of falling: Secondary | ICD-10-CM | POA: Diagnosis not present

## 2016-05-30 DIAGNOSIS — M6281 Muscle weakness (generalized): Secondary | ICD-10-CM | POA: Diagnosis not present

## 2016-06-02 DIAGNOSIS — M6281 Muscle weakness (generalized): Secondary | ICD-10-CM | POA: Diagnosis not present

## 2016-06-02 DIAGNOSIS — Z9181 History of falling: Secondary | ICD-10-CM | POA: Diagnosis not present

## 2016-06-02 DIAGNOSIS — R262 Difficulty in walking, not elsewhere classified: Secondary | ICD-10-CM | POA: Diagnosis not present

## 2016-06-04 DIAGNOSIS — Z9181 History of falling: Secondary | ICD-10-CM | POA: Diagnosis not present

## 2016-06-04 DIAGNOSIS — R262 Difficulty in walking, not elsewhere classified: Secondary | ICD-10-CM | POA: Diagnosis not present

## 2016-06-04 DIAGNOSIS — M6281 Muscle weakness (generalized): Secondary | ICD-10-CM | POA: Diagnosis not present

## 2016-06-06 DIAGNOSIS — Z9181 History of falling: Secondary | ICD-10-CM | POA: Diagnosis not present

## 2016-06-06 DIAGNOSIS — M6281 Muscle weakness (generalized): Secondary | ICD-10-CM | POA: Diagnosis not present

## 2016-06-06 DIAGNOSIS — R262 Difficulty in walking, not elsewhere classified: Secondary | ICD-10-CM | POA: Diagnosis not present

## 2016-06-09 DIAGNOSIS — Z9181 History of falling: Secondary | ICD-10-CM | POA: Diagnosis not present

## 2016-06-09 DIAGNOSIS — R262 Difficulty in walking, not elsewhere classified: Secondary | ICD-10-CM | POA: Diagnosis not present

## 2016-06-09 DIAGNOSIS — M6281 Muscle weakness (generalized): Secondary | ICD-10-CM | POA: Diagnosis not present

## 2016-06-12 ENCOUNTER — Emergency Department (HOSPITAL_BASED_OUTPATIENT_CLINIC_OR_DEPARTMENT_OTHER)
Admission: EM | Admit: 2016-06-12 | Discharge: 2016-06-12 | Disposition: A | Discharge status: Home / Self Care | Payer: Medicare HMO | Attending: Emergency Medicine | Admitting: Emergency Medicine

## 2016-06-12 ENCOUNTER — Emergency Department (HOSPITAL_BASED_OUTPATIENT_CLINIC_OR_DEPARTMENT_OTHER): Payer: Medicare HMO

## 2016-06-12 ENCOUNTER — Encounter (HOSPITAL_BASED_OUTPATIENT_CLINIC_OR_DEPARTMENT_OTHER): Payer: Self-pay | Admitting: *Deleted

## 2016-06-12 DIAGNOSIS — R112 Nausea with vomiting, unspecified: Secondary | ICD-10-CM | POA: Diagnosis not present

## 2016-06-12 DIAGNOSIS — Z7982 Long term (current) use of aspirin: Secondary | ICD-10-CM | POA: Insufficient documentation

## 2016-06-12 DIAGNOSIS — E876 Hypokalemia: Secondary | ICD-10-CM | POA: Diagnosis not present

## 2016-06-12 DIAGNOSIS — R197 Diarrhea, unspecified: Secondary | ICD-10-CM | POA: Diagnosis not present

## 2016-06-12 DIAGNOSIS — I10 Essential (primary) hypertension: Secondary | ICD-10-CM | POA: Diagnosis not present

## 2016-06-12 LAB — CBC WITH DIFFERENTIAL/PLATELET
Basophils Absolute: 0 10*3/uL (ref 0.0–0.1)
Basophils Relative: 0 %
Eosinophils Absolute: 0 10*3/uL (ref 0.0–0.7)
Eosinophils Relative: 1 %
HCT: 44.9 % (ref 36.0–46.0)
Hemoglobin: 15.3 g/dL — ABNORMAL HIGH (ref 12.0–15.0)
Lymphocytes Relative: 22 %
Lymphs Abs: 1.4 10*3/uL (ref 0.7–4.0)
MCH: 30.5 pg (ref 26.0–34.0)
MCHC: 34.1 g/dL (ref 30.0–36.0)
MCV: 89.6 fL (ref 78.0–100.0)
Monocytes Absolute: 0.8 10*3/uL (ref 0.1–1.0)
Monocytes Relative: 13 %
Neutro Abs: 3.9 10*3/uL (ref 1.7–7.7)
Neutrophils Relative %: 64 %
Platelets: 220 10*3/uL (ref 150–400)
RBC: 5.01 MIL/uL (ref 3.87–5.11)
RDW: 13.5 % (ref 11.5–15.5)
WBC: 6 10*3/uL (ref 4.0–10.5)

## 2016-06-12 LAB — BASIC METABOLIC PANEL
Anion gap: 11 (ref 5–15)
BUN: 25 mg/dL — ABNORMAL HIGH (ref 6–20)
CO2: 25 mmol/L (ref 22–32)
Calcium: 9.2 mg/dL (ref 8.9–10.3)
Chloride: 99 mmol/L — ABNORMAL LOW (ref 101–111)
Creatinine, Ser: 0.8 mg/dL (ref 0.44–1.00)
GFR calc Af Amer: >60 mL/min (ref >60)
GFR calc non Af Amer: >60 mL/min (ref >60)
Glucose, Bld: 119 mg/dL — ABNORMAL HIGH (ref 65–99)
Potassium: 2.5 mmol/L — CL (ref 3.5–5.1)
Sodium: 135 mmol/L (ref 135–145)

## 2016-06-12 LAB — MAGNESIUM: Magnesium: 1.5 mg/dL — ABNORMAL LOW (ref 1.7–2.4)

## 2016-06-12 MED ORDER — ONDANSETRON HCL 4 MG/2ML IJ SOLN
4.0000 mg | Freq: Once | INTRAMUSCULAR | Status: AC
  Administered 2016-06-12: 4 mg via INTRAVENOUS
  Filled 2016-06-12: qty 2

## 2016-06-12 MED ORDER — ONDANSETRON 4 MG PO TBDP: ORAL_TABLET | ORAL | 0 refills | Status: DC

## 2016-06-12 MED ORDER — SODIUM CHLORIDE 0.9 % IV BOLUS (SEPSIS)
500.0000 mL | Freq: Once | INTRAVENOUS | Status: AC
  Administered 2016-06-12: 500 mL via INTRAVENOUS

## 2016-06-12 MED ORDER — POTASSIUM CHLORIDE CRYS ER 20 MEQ PO TBCR
80.0000 meq | EXTENDED_RELEASE_TABLET | Freq: Once | ORAL | Status: AC
  Administered 2016-06-12: 80 meq via ORAL
  Filled 2016-06-12: qty 4

## 2016-06-12 MED ORDER — POTASSIUM CHLORIDE ER 20 MEQ PO TBCR: 20.0000 meq | EXTENDED_RELEASE_TABLET | Freq: Two times a day (BID) | ORAL | 0 refills | Status: DC

## 2016-06-12 MED ORDER — DICYCLOMINE HCL 10 MG/ML IM SOLN
20.0000 mg | Freq: Once | INTRAMUSCULAR | Status: AC
  Administered 2016-06-12: 20 mg via INTRAMUSCULAR
  Filled 2016-06-12: qty 2

## 2016-06-12 MED ORDER — MAGNESIUM SULFATE 2 GM/50ML IV SOLN
2.0000 g | Freq: Once | INTRAVENOUS | Status: AC
  Administered 2016-06-12: 2 g via INTRAVENOUS
  Filled 2016-06-12: qty 50

## 2016-06-12 MED FILL — POTASSIUM CL ER 20 MEQ TABL: 20 | 7 days supply | Qty: 14 | Fill #0

## 2016-06-12 MED FILL — ONDANSETRON ODT 4 MG TABLET: 4 | 2 days supply | Qty: 4 | Fill #0

## 2016-06-12 NOTE — ED Provider Notes (Addendum)
[facility] Provider Note   CSN: [id number] Arrival date & time: 06/12/16  0445     History   Chief Complaint Chief Complaint  Patient presents with  . Diarrhea    HPI Latoya Curry is a 83 y.o. female.  The history is provided by the patient.  Diarrhea   This is a recurrent problem. The current episode started 3 to 5 hours ago. The problem occurs 2 to 4 times per day. The problem has not changed since onset.The stool consistency is described as watery. There has been no fever. Pertinent negatives include no abdominal pain, no vomiting and no chills. Associated symptoms comments: Had n/v/d on Monday that resolved and then the diarrhea came back so she was worried and came in to be seen.  Has known hypokalemia . She has tried nothing for the symptoms. The treatment provided no relief. Risk factors: communal dining. Her past medical history does not include irritable bowel syndrome.    Past Medical History:  Diagnosis Date  . Chronic low back pain   . Diverticulitis   . DJD (degenerative joint disease) of knee   . Hypercholesteremia   . Hypertension   . Neurodermatitis   . Obesity   . Osteopenia   . Recurrent UTI   . Transient global amnesia     Patient Active Problem List   Diagnosis Date Noted  . Cough 09/23/2011    Past Surgical History:  Procedure Laterality Date  . TOTAL KNEE ARTHROPLASTY     bilaterallly    OB History    No data available       Home Medications    Prior to Admission medications   Medication Sig Start Date End Date Taking? Authorizing Provider  aspirin 81 MG tablet Take 81 mg by mouth. Three times per wk    Historical Provider, MD  atenolol-chlorthalidone (TENORETIC) 50-25 MG per tablet Take 1 tablet by mouth daily.    Historical Provider, MD  cholecalciferol (VITAMIN D) 1000 UNITS tablet Take 1,000 Units by mouth daily.    Historical Provider, MD  citalopram (CELEXA) 20 MG tablet Take 20 mg by mouth daily.    Historical  Provider, MD  fluticasone (FLONASE) 50 MCG/ACT nasal spray Place 2 sprays into the nose daily.    Historical Provider, MD  naproxen sodium (ANAPROX) 220 MG tablet Take 220 mg by mouth 2 (two) times daily with a meal.    Historical Provider, MD  omeprazole (PRILOSEC) 20 MG capsule Take 30- 60 min before your first and last meals of the day 09/23/11   Tanda Rockers, MD  rosuvastatin (CRESTOR) 10 MG tablet Take 5 mg by mouth daily.    Historical Provider, MD    Family History Family History  Problem Relation Age of Onset  . Heart disease Father   . Leukemia Mother     Social History Social History  Substance Use Topics  . Smoking status: Never Smoker  . Smokeless tobacco: Never Used  . Alcohol use No     Allergies   Tramadol hcl; Codeine; Bactrim [sulfamethoxazole-trimethoprim]; Zestril [lisinopril]; and Zocor [simvastatin]   Review of Systems Review of Systems  Constitutional: Negative for chills and fever.  Respiratory: Negative for shortness of breath.   Cardiovascular: Negative for chest pain.  Gastrointestinal: Positive for diarrhea. Negative for abdominal pain, nausea and vomiting.  All other systems reviewed and are negative.    Physical Exam Updated Vital Signs BP (!) 133/91 (BP Location: Right Arm)  Pulse (!) 107   Temp 98.6 F (37 C) (Oral)   Resp 16   Ht 5\' 6"  (1.676 m)   Wt 159 lb (72.1 kg)   SpO2 94%   BMI 25.66 kg/m   Physical Exam  Constitutional: She is oriented to person, place, and time. She appears well-developed and well-nourished. No distress.  HENT:  Head: Normocephalic and atraumatic.  Mouth/Throat: Oropharynx is clear and moist. No oropharyngeal exudate.  Eyes: Conjunctivae are normal. Pupils are equal, round, and reactive to light.  Neck: Normal range of motion. Neck supple.  Cardiovascular: Normal rate, regular rhythm, normal heart sounds and intact distal pulses.   Pulmonary/Chest: Effort normal and breath sounds normal.  Abdominal:  Soft. Bowel sounds are normal. She exhibits no distension and no mass. There is no tenderness. There is no rebound and no guarding. No hernia.  Musculoskeletal: Normal range of motion. She exhibits no edema.  Neurological: She is alert and oriented to person, place, and time.  Skin: Skin is warm and dry. Capillary refill takes less than 2 seconds.  Psychiatric: She has a normal mood and affect.  Nursing note and vitals reviewed.    ED Treatments / Results   Vitals:   06/12/16 0457  BP: (!) 133/91  Pulse: (!) 107  Resp: 16  Temp: 98.6 F (37 C)    Labs (all labs ordered are listed, but only abnormal results are displayed)  Results for orders placed or performed during the hospital encounter of 06/12/16  CBC with Differential/Platelet  Result Value Ref Range   WBC 6.0 4.0 - 10.5 K/uL   RBC 5.01 3.87 - 5.11 MIL/uL   Hemoglobin 15.3 (H) 12.0 - 15.0 g/dL   HCT 44.9 36.0 - 46.0 %   MCV 89.6 78.0 - 100.0 fL   MCH 30.5 26.0 - 34.0 pg   MCHC 34.1 30.0 - 36.0 g/dL   RDW 13.5 11.5 - 15.5 %   Platelets 220 150 - 400 K/uL   Neutrophils Relative % 64 %   Neutro Abs 3.9 1.7 - 7.7 K/uL   Lymphocytes Relative 22 %   Lymphs Abs 1.4 0.7 - 4.0 K/uL   Monocytes Relative 13 %   Monocytes Absolute 0.8 0.1 - 1.0 K/uL   Eosinophils Relative 1 %   Eosinophils Absolute 0.0 0.0 - 0.7 K/uL   Basophils Relative 0 %   Basophils Absolute 0.0 0.0 - 0.1 K/uL  Basic metabolic panel  Result Value Ref Range   Sodium 135 135 - 145 mmol/L   Potassium 2.5 (LL) 3.5 - 5.1 mmol/L   Chloride 99 (L) 101 - 111 mmol/L   CO2 25 22 - 32 mmol/L   Glucose, Bld 119 (H) 65 - 99 mg/dL   BUN 25 (H) 6 - 20 mg/dL   Creatinine, Ser 0.80 0.44 - 1.00 mg/dL   Calcium 9.2 8.9 - 10.3 mg/dL   GFR calc non Af Amer >60 >60 mL/min   GFR calc Af Amer >60 >60 mL/min   Anion gap 11 5 - 15  Magnesium  Result Value Ref Range   Magnesium 1.5 (L) 1.7 - 2.4 mg/dL   Dg Abdomen Acute W/chest  Result Date: 06/12/2016 CLINICAL  DATA:  Nausea, vomiting, and diarrhea 3 days ago and now again last night. EXAM: DG ABDOMEN ACUTE W/ 1V CHEST COMPARISON:  Chest 02/27/2015 FINDINGS: Mild cardiac enlargement. Pulmonary vascularity is normal. Mild interstitial changes in the lungs likely representing fibrosis. Mild emphysematous changes in the lungs. No focal consolidation.  No blunting of costophrenic angles. No pneumothorax. Calcified and tortuous aorta. Scattered gas and stool throughout the colon. No small or large bowel distention. No free intra- abdominal air. No abnormal air-fluid levels. No radiopaque stones. Calcified phleboliths in the pelvis. Foreign body in the low pelvis possibly representing a pessary. Postoperative changes with screw fixation of the left hip. Degenerative changes in the lumbar spine and both hips. Lumbar scoliosis convex towards the right. IMPRESSION: Emphysematous and chronic bronchitic changes in the lungs. No evidence of active pulmonary disease. Nonobstructive bowel gas pattern. Electronically Signed   By: Lucienne Capers M.D.   On: 06/12/2016 06:33    Procedures Procedures (including critical care time)  Medications Ordered in ED Medications  sodium chloride 0.9 % bolus 500 mL (500 mLs Intravenous New Bag/Given 06/12/16 0551)  magnesium sulfate IVPB 2 g 50 mL (not administered)  potassium chloride SA (K-DUR,KLOR-CON) CR tablet 80 mEq (not administered)  dicyclomine (BENTYL) injection 20 mg (20 mg Intramuscular Given 06/12/16 0603)  ondansetron (ZOFRAN) injection 4 mg (4 mg Intravenous Given 06/12/16 0604)     Final Clinical Impressions(s) / ED Diagnoses  Viral GI illness and hypokalemia/hypomagnesemia:  Have repleated the magnesium.  Have given PO potassium and though patient takes this at home, will increase the dose to BID x 7 days and have patient follow up with her PMD within 5 days for recheck.   Exam and vitals are benign and reassuring.  I do not feel advanced imaging is necessary at this  time. The patient is nontoxic-appearing and imaging is negative for acute finding.Return for fevers, shortness of breath, chest pain, intractable vomiting or diarrhea or abdominal pain or any concerns.   After history, exam, and medical workup I feel the patient has been appropriately medically screened and is safe for discharge home. Pertinent diagnoses were discussed with the patient. Patient was given return precautions. New Prescriptions New Prescriptions   No medications on file     Gaye Scorza, MD 06/12/16 1856    Demeshia Sherburne, MD 06/12/16 [phone]

## 2016-06-12 NOTE — ED Triage Notes (Signed)
Pt had N/V/D that began on Monday PM sx resolved after 24 hours then Diarrhea began again last night around 9 pm denies abd pain or N/V or fever. Has used imodium and pepto bis mal for sx

## 2016-06-12 NOTE — ED Notes (Signed)
Patient transported to X-ray 

## 2016-06-12 NOTE — ED Notes (Signed)
EDP notified of K+

## 2016-06-16 DIAGNOSIS — R262 Difficulty in walking, not elsewhere classified: Secondary | ICD-10-CM | POA: Diagnosis not present

## 2016-06-16 DIAGNOSIS — Z9181 History of falling: Secondary | ICD-10-CM | POA: Diagnosis not present

## 2016-06-16 DIAGNOSIS — M6281 Muscle weakness (generalized): Secondary | ICD-10-CM | POA: Diagnosis not present

## 2016-06-18 DIAGNOSIS — R262 Difficulty in walking, not elsewhere classified: Secondary | ICD-10-CM | POA: Diagnosis not present

## 2016-06-18 DIAGNOSIS — Z9181 History of falling: Secondary | ICD-10-CM | POA: Diagnosis not present

## 2016-06-18 DIAGNOSIS — M6281 Muscle weakness (generalized): Secondary | ICD-10-CM | POA: Diagnosis not present

## 2016-06-19 DIAGNOSIS — M6281 Muscle weakness (generalized): Secondary | ICD-10-CM | POA: Diagnosis not present

## 2016-06-19 DIAGNOSIS — Z9181 History of falling: Secondary | ICD-10-CM | POA: Diagnosis not present

## 2016-06-19 DIAGNOSIS — R262 Difficulty in walking, not elsewhere classified: Secondary | ICD-10-CM | POA: Diagnosis not present

## 2016-06-25 DIAGNOSIS — R262 Difficulty in walking, not elsewhere classified: Secondary | ICD-10-CM | POA: Diagnosis not present

## 2016-06-25 DIAGNOSIS — M6281 Muscle weakness (generalized): Secondary | ICD-10-CM | POA: Diagnosis not present

## 2016-06-25 DIAGNOSIS — Z9181 History of falling: Secondary | ICD-10-CM | POA: Diagnosis not present

## 2016-06-26 DIAGNOSIS — M6281 Muscle weakness (generalized): Secondary | ICD-10-CM | POA: Diagnosis not present

## 2016-06-26 DIAGNOSIS — R262 Difficulty in walking, not elsewhere classified: Secondary | ICD-10-CM | POA: Diagnosis not present

## 2016-06-26 DIAGNOSIS — Z9181 History of falling: Secondary | ICD-10-CM | POA: Diagnosis not present

## 2016-06-27 DIAGNOSIS — R262 Difficulty in walking, not elsewhere classified: Secondary | ICD-10-CM | POA: Diagnosis not present

## 2016-06-27 DIAGNOSIS — Z9181 History of falling: Secondary | ICD-10-CM | POA: Diagnosis not present

## 2016-06-27 DIAGNOSIS — M6281 Muscle weakness (generalized): Secondary | ICD-10-CM | POA: Diagnosis not present

## 2016-06-30 DIAGNOSIS — Z9181 History of falling: Secondary | ICD-10-CM | POA: Diagnosis not present

## 2016-06-30 DIAGNOSIS — R262 Difficulty in walking, not elsewhere classified: Secondary | ICD-10-CM | POA: Diagnosis not present

## 2016-06-30 DIAGNOSIS — M6281 Muscle weakness (generalized): Secondary | ICD-10-CM | POA: Diagnosis not present

## 2016-07-03 DIAGNOSIS — Z9181 History of falling: Secondary | ICD-10-CM | POA: Diagnosis not present

## 2016-07-03 DIAGNOSIS — R262 Difficulty in walking, not elsewhere classified: Secondary | ICD-10-CM | POA: Diagnosis not present

## 2016-07-03 DIAGNOSIS — M6281 Muscle weakness (generalized): Secondary | ICD-10-CM | POA: Diagnosis not present

## 2016-07-04 DIAGNOSIS — M6281 Muscle weakness (generalized): Secondary | ICD-10-CM | POA: Diagnosis not present

## 2016-07-04 DIAGNOSIS — R262 Difficulty in walking, not elsewhere classified: Secondary | ICD-10-CM | POA: Diagnosis not present

## 2016-07-04 DIAGNOSIS — Z9181 History of falling: Secondary | ICD-10-CM | POA: Diagnosis not present

## 2016-07-09 DIAGNOSIS — M6281 Muscle weakness (generalized): Secondary | ICD-10-CM | POA: Diagnosis not present

## 2016-07-09 DIAGNOSIS — Z9181 History of falling: Secondary | ICD-10-CM | POA: Diagnosis not present

## 2016-07-09 DIAGNOSIS — R262 Difficulty in walking, not elsewhere classified: Secondary | ICD-10-CM | POA: Diagnosis not present

## 2016-07-10 DIAGNOSIS — M6281 Muscle weakness (generalized): Secondary | ICD-10-CM | POA: Diagnosis not present

## 2016-07-10 DIAGNOSIS — R262 Difficulty in walking, not elsewhere classified: Secondary | ICD-10-CM | POA: Diagnosis not present

## 2016-07-10 DIAGNOSIS — Z9181 History of falling: Secondary | ICD-10-CM | POA: Diagnosis not present

## 2016-07-11 DIAGNOSIS — Z9181 History of falling: Secondary | ICD-10-CM | POA: Diagnosis not present

## 2016-07-11 DIAGNOSIS — M6281 Muscle weakness (generalized): Secondary | ICD-10-CM | POA: Diagnosis not present

## 2016-07-11 DIAGNOSIS — R262 Difficulty in walking, not elsewhere classified: Secondary | ICD-10-CM | POA: Diagnosis not present

## 2016-07-14 DIAGNOSIS — M6281 Muscle weakness (generalized): Secondary | ICD-10-CM | POA: Diagnosis not present

## 2016-07-14 DIAGNOSIS — Z9181 History of falling: Secondary | ICD-10-CM | POA: Diagnosis not present

## 2016-07-14 DIAGNOSIS — R262 Difficulty in walking, not elsewhere classified: Secondary | ICD-10-CM | POA: Diagnosis not present

## 2016-07-15 DIAGNOSIS — R262 Difficulty in walking, not elsewhere classified: Secondary | ICD-10-CM | POA: Diagnosis not present

## 2016-07-15 DIAGNOSIS — Z9181 History of falling: Secondary | ICD-10-CM | POA: Diagnosis not present

## 2016-07-15 DIAGNOSIS — M6281 Muscle weakness (generalized): Secondary | ICD-10-CM | POA: Diagnosis not present

## 2016-07-16 DIAGNOSIS — M6281 Muscle weakness (generalized): Secondary | ICD-10-CM | POA: Diagnosis not present

## 2016-07-16 DIAGNOSIS — R262 Difficulty in walking, not elsewhere classified: Secondary | ICD-10-CM | POA: Diagnosis not present

## 2016-07-16 DIAGNOSIS — Z9181 History of falling: Secondary | ICD-10-CM | POA: Diagnosis not present

## 2016-07-22 DIAGNOSIS — R262 Difficulty in walking, not elsewhere classified: Secondary | ICD-10-CM | POA: Diagnosis not present

## 2016-07-22 DIAGNOSIS — M6281 Muscle weakness (generalized): Secondary | ICD-10-CM | POA: Diagnosis not present

## 2016-07-22 DIAGNOSIS — Z9181 History of falling: Secondary | ICD-10-CM | POA: Diagnosis not present

## 2016-07-23 DIAGNOSIS — Z9181 History of falling: Secondary | ICD-10-CM | POA: Diagnosis not present

## 2016-07-23 DIAGNOSIS — M6281 Muscle weakness (generalized): Secondary | ICD-10-CM | POA: Diagnosis not present

## 2016-07-23 DIAGNOSIS — R262 Difficulty in walking, not elsewhere classified: Secondary | ICD-10-CM | POA: Diagnosis not present

## 2016-07-25 DIAGNOSIS — M6281 Muscle weakness (generalized): Secondary | ICD-10-CM | POA: Diagnosis not present

## 2016-07-25 DIAGNOSIS — R262 Difficulty in walking, not elsewhere classified: Secondary | ICD-10-CM | POA: Diagnosis not present

## 2016-07-25 DIAGNOSIS — Z9181 History of falling: Secondary | ICD-10-CM | POA: Diagnosis not present

## 2016-07-28 DIAGNOSIS — R262 Difficulty in walking, not elsewhere classified: Secondary | ICD-10-CM | POA: Diagnosis not present

## 2016-07-28 DIAGNOSIS — M6281 Muscle weakness (generalized): Secondary | ICD-10-CM | POA: Diagnosis not present

## 2016-07-28 DIAGNOSIS — Z9181 History of falling: Secondary | ICD-10-CM | POA: Diagnosis not present

## 2016-07-30 DIAGNOSIS — R262 Difficulty in walking, not elsewhere classified: Secondary | ICD-10-CM | POA: Diagnosis not present

## 2016-07-30 DIAGNOSIS — Z9181 History of falling: Secondary | ICD-10-CM | POA: Diagnosis not present

## 2016-07-30 DIAGNOSIS — M6281 Muscle weakness (generalized): Secondary | ICD-10-CM | POA: Diagnosis not present

## 2016-08-01 DIAGNOSIS — M6281 Muscle weakness (generalized): Secondary | ICD-10-CM | POA: Diagnosis not present

## 2016-08-01 DIAGNOSIS — Z9181 History of falling: Secondary | ICD-10-CM | POA: Diagnosis not present

## 2016-08-01 DIAGNOSIS — R262 Difficulty in walking, not elsewhere classified: Secondary | ICD-10-CM | POA: Diagnosis not present

## 2016-08-04 DIAGNOSIS — M6281 Muscle weakness (generalized): Secondary | ICD-10-CM | POA: Diagnosis not present

## 2016-08-04 DIAGNOSIS — Z9181 History of falling: Secondary | ICD-10-CM | POA: Diagnosis not present

## 2016-08-04 DIAGNOSIS — R262 Difficulty in walking, not elsewhere classified: Secondary | ICD-10-CM | POA: Diagnosis not present

## 2016-08-06 DIAGNOSIS — M6281 Muscle weakness (generalized): Secondary | ICD-10-CM | POA: Diagnosis not present

## 2016-08-06 DIAGNOSIS — R262 Difficulty in walking, not elsewhere classified: Secondary | ICD-10-CM | POA: Diagnosis not present

## 2016-08-06 DIAGNOSIS — Z9181 History of falling: Secondary | ICD-10-CM | POA: Diagnosis not present

## 2016-08-07 DIAGNOSIS — M6281 Muscle weakness (generalized): Secondary | ICD-10-CM | POA: Diagnosis not present

## 2016-08-07 DIAGNOSIS — R262 Difficulty in walking, not elsewhere classified: Secondary | ICD-10-CM | POA: Diagnosis not present

## 2016-08-07 DIAGNOSIS — Z9181 History of falling: Secondary | ICD-10-CM | POA: Diagnosis not present

## 2016-08-08 DIAGNOSIS — Z961 Presence of intraocular lens: Secondary | ICD-10-CM | POA: Diagnosis not present

## 2016-08-08 DIAGNOSIS — H26492 Other secondary cataract, left eye: Secondary | ICD-10-CM | POA: Diagnosis not present

## 2016-08-08 DIAGNOSIS — H04123 Dry eye syndrome of bilateral lacrimal glands: Secondary | ICD-10-CM | POA: Diagnosis not present

## 2016-08-11 DIAGNOSIS — Z9181 History of falling: Secondary | ICD-10-CM | POA: Diagnosis not present

## 2016-08-11 DIAGNOSIS — R262 Difficulty in walking, not elsewhere classified: Secondary | ICD-10-CM | POA: Diagnosis not present

## 2016-08-11 DIAGNOSIS — M6281 Muscle weakness (generalized): Secondary | ICD-10-CM | POA: Diagnosis not present

## 2016-08-13 DIAGNOSIS — M6281 Muscle weakness (generalized): Secondary | ICD-10-CM | POA: Diagnosis not present

## 2016-08-13 DIAGNOSIS — Z9181 History of falling: Secondary | ICD-10-CM | POA: Diagnosis not present

## 2016-08-13 DIAGNOSIS — R262 Difficulty in walking, not elsewhere classified: Secondary | ICD-10-CM | POA: Diagnosis not present

## 2016-08-14 DIAGNOSIS — Z9181 History of falling: Secondary | ICD-10-CM | POA: Diagnosis not present

## 2016-08-14 DIAGNOSIS — R262 Difficulty in walking, not elsewhere classified: Secondary | ICD-10-CM | POA: Diagnosis not present

## 2016-08-14 DIAGNOSIS — M6281 Muscle weakness (generalized): Secondary | ICD-10-CM | POA: Diagnosis not present

## 2016-08-18 DIAGNOSIS — Z9181 History of falling: Secondary | ICD-10-CM | POA: Diagnosis not present

## 2016-08-18 DIAGNOSIS — R262 Difficulty in walking, not elsewhere classified: Secondary | ICD-10-CM | POA: Diagnosis not present

## 2016-08-18 DIAGNOSIS — M6281 Muscle weakness (generalized): Secondary | ICD-10-CM | POA: Diagnosis not present

## 2016-08-20 DIAGNOSIS — N8111 Cystocele, midline: Secondary | ICD-10-CM | POA: Diagnosis not present

## 2016-08-26 DIAGNOSIS — R21 Rash and other nonspecific skin eruption: Secondary | ICD-10-CM | POA: Diagnosis not present

## 2016-08-26 DIAGNOSIS — M5432 Sciatica, left side: Secondary | ICD-10-CM | POA: Diagnosis not present

## 2016-10-01 DIAGNOSIS — Z1231 Encounter for screening mammogram for malignant neoplasm of breast: Secondary | ICD-10-CM | POA: Diagnosis not present

## 2016-11-11 DIAGNOSIS — J069 Acute upper respiratory infection, unspecified: Secondary | ICD-10-CM | POA: Diagnosis not present

## 2016-11-11 DIAGNOSIS — K219 Gastro-esophageal reflux disease without esophagitis: Secondary | ICD-10-CM | POA: Diagnosis not present

## 2016-12-22 DIAGNOSIS — E7800 Pure hypercholesterolemia, unspecified: Secondary | ICD-10-CM | POA: Diagnosis not present

## 2016-12-22 DIAGNOSIS — I10 Essential (primary) hypertension: Secondary | ICD-10-CM | POA: Diagnosis not present

## 2016-12-22 DIAGNOSIS — G4700 Insomnia, unspecified: Secondary | ICD-10-CM | POA: Diagnosis not present

## 2016-12-22 DIAGNOSIS — Z79899 Other long term (current) drug therapy: Secondary | ICD-10-CM | POA: Diagnosis not present

## 2016-12-22 DIAGNOSIS — Z0001 Encounter for general adult medical examination with abnormal findings: Secondary | ICD-10-CM | POA: Diagnosis not present

## 2016-12-22 DIAGNOSIS — J309 Allergic rhinitis, unspecified: Secondary | ICD-10-CM | POA: Diagnosis not present

## 2016-12-22 DIAGNOSIS — M5432 Sciatica, left side: Secondary | ICD-10-CM | POA: Diagnosis not present

## 2016-12-22 DIAGNOSIS — Z0000 Encounter for general adult medical examination without abnormal findings: Secondary | ICD-10-CM | POA: Diagnosis not present

## 2016-12-22 DIAGNOSIS — F439 Reaction to severe stress, unspecified: Secondary | ICD-10-CM | POA: Diagnosis not present

## 2016-12-22 DIAGNOSIS — E559 Vitamin D deficiency, unspecified: Secondary | ICD-10-CM | POA: Diagnosis not present

## 2016-12-22 DIAGNOSIS — Z1389 Encounter for screening for other disorder: Secondary | ICD-10-CM | POA: Diagnosis not present

## 2016-12-22 DIAGNOSIS — R634 Abnormal weight loss: Secondary | ICD-10-CM | POA: Diagnosis not present

## 2016-12-26 DIAGNOSIS — N8111 Cystocele, midline: Secondary | ICD-10-CM | POA: Diagnosis not present

## 2017-04-13 DIAGNOSIS — L821 Other seborrheic keratosis: Secondary | ICD-10-CM | POA: Diagnosis not present

## 2017-04-13 DIAGNOSIS — L814 Other melanin hyperpigmentation: Secondary | ICD-10-CM | POA: Diagnosis not present

## 2017-04-13 DIAGNOSIS — Z08 Encounter for follow-up examination after completed treatment for malignant neoplasm: Secondary | ICD-10-CM | POA: Diagnosis not present

## 2017-04-13 DIAGNOSIS — Z85828 Personal history of other malignant neoplasm of skin: Secondary | ICD-10-CM | POA: Diagnosis not present

## 2017-04-13 DIAGNOSIS — L570 Actinic keratosis: Secondary | ICD-10-CM | POA: Diagnosis not present

## 2017-04-29 DIAGNOSIS — N3281 Overactive bladder: Secondary | ICD-10-CM | POA: Diagnosis not present

## 2017-04-29 DIAGNOSIS — N8111 Cystocele, midline: Secondary | ICD-10-CM | POA: Diagnosis not present

## 2017-06-26 ENCOUNTER — Ambulatory Visit: Payer: Self-pay | Admitting: Family Medicine

## 2017-06-26 VITALS — BP 124/70 | HR 70 | Temp 98.6°F | Resp 17 | Wt 162.0 lb

## 2017-06-26 DIAGNOSIS — J40 Bronchitis, not specified as acute or chronic: Secondary | ICD-10-CM

## 2017-06-26 DIAGNOSIS — R062 Wheezing: Secondary | ICD-10-CM

## 2017-06-26 DIAGNOSIS — R05 Cough: Secondary | ICD-10-CM

## 2017-06-26 DIAGNOSIS — R059 Cough, unspecified: Secondary | ICD-10-CM

## 2017-06-26 MED ORDER — DOXYCYCLINE HYCLATE 100 MG PO TABS: 100.0000 mg | ORAL_TABLET | Freq: Two times a day (BID) | ORAL | 0 refills | Status: DC

## 2017-06-26 MED ORDER — ALBUTEROL SULFATE HFA 108 (90 BASE) MCG/ACT IN AERS: 2.0000 | INHALATION_SPRAY | Freq: Four times a day (QID) | RESPIRATORY_TRACT | 0 refills | Status: DC | PRN

## 2017-06-26 MED ORDER — CLARITHROMYCIN 500 MG PO TABS: 500.0000 mg | ORAL_TABLET | Freq: Two times a day (BID) | ORAL | 0 refills | Status: AC

## 2017-06-26 MED ORDER — PREDNISONE 20 MG PO TABS: 40.0000 mg | ORAL_TABLET | Freq: Every day | ORAL | 0 refills | Status: AC

## 2017-06-26 MED ORDER — AEROCHAMBER PLUS FLO-VU LARGE MISC: 1.0000 | Freq: Once | 0 refills | Status: AC

## 2017-06-26 NOTE — Progress Notes (Signed)
Latoya Curry is a 84 y.o. female who presents today with concerns of cough and congestion.  She has tried multiple over-the-counter medications.  She reports concerns because she lives in a congregate setting with other elderly people and is exposed to family members and friends.She has concerns for giving her symptoms to others.  Patient reports difficulty sleeping due to difficulty breathing when lying down.  Review of Systems  Constitutional: Positive for malaise/fatigue. Negative for chills and fever.  HENT: Positive for congestion. Negative for ear discharge, ear pain, sinus pain and sore throat.   Eyes: Negative.   Respiratory: Positive for cough and sputum production. Negative for shortness of breath.   Cardiovascular: Negative.  Negative for chest pain.  Gastrointestinal: Negative for abdominal pain, diarrhea, nausea and vomiting.  Genitourinary: Negative for dysuria, frequency, hematuria and urgency.  Musculoskeletal: Negative for myalgias.  Skin: Negative.   Neurological: Negative for headaches.  Endo/Heme/Allergies: Negative.   Psychiatric/Behavioral: Negative.     O: Vitals:   06/26/17 1248  BP: 124/70  Pulse: 70  Resp: 17  Temp: 98.6 F (37 C)  SpO2: 96%    Physical Exam  Constitutional: She is oriented to person, place, and time. Vital signs are normal. She appears well-developed and well-nourished. She is active.  Non-toxic appearance. She does not have a sickly appearance.  HENT:  Head: Normocephalic.  Right Ear: Hearing, tympanic membrane, external ear and ear canal normal.  Left Ear: Hearing, tympanic membrane, external ear and ear canal normal.  Nose: Mucosal edema and rhinorrhea present.  Mouth/Throat: Uvula is midline. Posterior oropharyngeal erythema present.  Neck: Normal range of motion. Neck supple.  Cardiovascular: Normal rate, regular rhythm, normal heart sounds and normal pulses.  Pulmonary/Chest: Effort normal. She has wheezes in the right upper  field, the right lower field, the left upper field and the left lower field. She has rhonchi in the right upper field, the right middle field, the right lower field, the left upper field, the left middle field and the left lower field.  Patient experienced on coughing fit in office where there was difficulty catching her breath and thick rust colored sputum was expectorated.  Abdominal: Soft. Bowel sounds are normal.  Musculoskeletal: Normal range of motion.  Lymphadenopathy:       Head (right side): No submental and no submandibular adenopathy present.       Head (left side): No submental and no submandibular adenopathy present.    She has cervical adenopathy.  Neurological: She is alert and oriented to person, place, and time.  Skin: Skin is warm.  Psychiatric: She has a normal mood and affect.  Vitals reviewed.  A: 1. Bronchitis   2. Wheezing   3. Cough    P: Patient is present with daughter and it is advised for PCP f/u and evaluation in next 5-7 days due to severity of symptoms and the inability to rule out pneumonia as the causative agent of symptoms.  Exam findings, diagnosis etiology and medication use and indications reviewed with patient. Follow- Up and discharge instructions provided. No emergent/urgent issues found on exam.  Patient verbalized understanding of information provided and agrees with plan of care (POC), all questions answered.  1. Bronchitis - predniSONE (DELTASONE) 20 MG tablet; Take 2 tablets (40 mg total) by mouth daily with breakfast for 5 days. - albuterol (PROVENTIL HFA;VENTOLIN HFA) 108 (90 Base) MCG/ACT inhaler; Inhale 2 puffs into the lungs every 6 (six) hours as needed for wheezing or shortness of breath. -  Spacer/Aero-Holding Chambers (AEROCHAMBER PLUS FLO-VU LARGE) MISC; 1 each by Other route once for 1 dose. - clarithromycin (BIAXIN) 500 MG tablet; Take 1 tablet (500 mg total) by mouth 2 (two) times daily for 5 days.  2. Wheezing - predniSONE  (DELTASONE) 20 MG tablet; Take 2 tablets (40 mg total) by mouth daily with breakfast for 5 days.  3. Cough - albuterol (PROVENTIL HFA;VENTOLIN HFA) 108 (90 Base) MCG/ACT inhaler; Inhale 2 puffs into the lungs every 6 (six) hours as needed for wheezing or shortness of breath. - Spacer/Aero-Holding Chambers (AEROCHAMBER PLUS FLO-VU LARGE) MISC; 1 each by Other route once for 1 dose.

## 2017-06-26 NOTE — Patient Instructions (Signed)

## 2017-06-29 ENCOUNTER — Telehealth: Payer: Self-pay

## 2017-06-29 NOTE — Telephone Encounter (Signed)
I left a message to the patient asking to call us back. 

## 2017-06-30 DIAGNOSIS — R0600 Dyspnea, unspecified: Secondary | ICD-10-CM | POA: Diagnosis not present

## 2017-08-03 DIAGNOSIS — R61 Generalized hyperhidrosis: Secondary | ICD-10-CM | POA: Diagnosis not present

## 2017-08-03 DIAGNOSIS — I10 Essential (primary) hypertension: Secondary | ICD-10-CM | POA: Diagnosis not present

## 2017-08-03 DIAGNOSIS — I499 Cardiac arrhythmia, unspecified: Secondary | ICD-10-CM | POA: Diagnosis not present

## 2017-08-03 DIAGNOSIS — R634 Abnormal weight loss: Secondary | ICD-10-CM | POA: Diagnosis not present

## 2017-08-05 ENCOUNTER — Encounter: Payer: Self-pay | Admitting: Cardiology

## 2017-08-05 ENCOUNTER — Ambulatory Visit: Payer: Medicare HMO | Admitting: Cardiology

## 2017-08-05 VITALS — BP 126/78 | HR 91 | Ht 66.0 in | Wt 153.1 lb

## 2017-08-05 DIAGNOSIS — E782 Mixed hyperlipidemia: Secondary | ICD-10-CM

## 2017-08-05 DIAGNOSIS — R0789 Other chest pain: Secondary | ICD-10-CM

## 2017-08-05 DIAGNOSIS — I10 Essential (primary) hypertension: Secondary | ICD-10-CM | POA: Diagnosis not present

## 2017-08-05 DIAGNOSIS — I712 Thoracic aortic aneurysm, without rupture: Secondary | ICD-10-CM | POA: Diagnosis not present

## 2017-08-05 DIAGNOSIS — I7121 Aneurysm of the ascending aorta, without rupture: Secondary | ICD-10-CM

## 2017-08-05 DIAGNOSIS — I493 Ventricular premature depolarization: Secondary | ICD-10-CM | POA: Insufficient documentation

## 2017-08-05 DIAGNOSIS — I7789 Other specified disorders of arteries and arterioles: Secondary | ICD-10-CM

## 2017-08-05 HISTORY — DX: Ventricular premature depolarization: I49.3

## 2017-08-05 HISTORY — DX: Other chest pain: R07.89

## 2017-08-05 HISTORY — DX: Mixed hyperlipidemia: E78.2

## 2017-08-05 HISTORY — DX: Essential (primary) hypertension: I10

## 2017-08-05 NOTE — Patient Instructions (Signed)
Medication Instructions:  Your physician recommends that you continue on your current medications as directed. Please refer to the Current Medication list given to you today.   Labwork: NONE   Testing/Procedures: You had and EKG today  Your physician has requested that you have an echocardiogram. Echocardiography is a painless test that uses sound waves to create images of your heart. It provides your doctor with information about the size and shape of your heart and how well your heart's chambers and valves are working. This procedure takes approximately one hour. There are no restrictions for this procedure.  Your physician has recommended that you wear a holter monitor. Holter monitors are medical devices that record the heart's electrical activity. Doctors most often use these monitors to diagnose arrhythmias. Arrhythmias are problems with the speed or rhythm of the heartbeat. The monitor is a small, portable device. You can wear one while you do your normal daily activities. This is usually used to diagnose what is causing palpitations/syncope (passing out).  Your physician has requested that you have a lexiscan myoview. For further information please visit [email or web address]. Please follow instruction sheet, as given.    Follow-Up: Your physician wants you to follow-up in: 2 months.   You will receive a reminder letter in the mail two months in advance. If you don't receive a letter, please call our office to schedule the follow-up appointment.   Any Other Special Instructions Will Be Listed Below (If Applicable).     If you need a refill on your cardiac medications before your next appointment, please call your pharmacy.

## 2017-08-05 NOTE — Progress Notes (Signed)
Cardiology Office Note:    Date:  08/05/2017   ID:  Latoya Curry, DOB December 03, 1930, MRN [id number]  PCP:  Josetta Huddle, MD  Cardiologist:  Jenean Lindau, MD   Referring MD: Josetta Huddle, MD    ASSESSMENT:    1. Essential hypertension   2. Mixed dyslipidemia   3. PVC (premature ventricular contraction)   4. Chest discomfort    PLAN:    In order of problems listed above:  1. Primary prevention stressed with patient.  Importance of compliance with diet and medications stressed and she vocalized understanding.  Her blood pressure is stable.  Her lipids are followed by her primary care physician and I counseled her about this. 2. She has frequent PVCs and it appears that she might have had this issue for a long time.  At any rate in view of her symptoms we will do a 48-hour Holter monitoring and a stress Lexiscan sestamibi to assess for any objective evidence of coronary artery disease.  This will help me assess her chest discomfort this is very atypical for coronary etiology. 3. Echocardiogram will be done to assess murmur heard on auscultation. 4. Patient will be seen in follow-up appointment in 3 months or earlier if the patient has any concerns    Medication Adjustments/Labs and Tests Ordered: Current medicines are reviewed at length with the patient today.  Concerns regarding medicines are outlined above.  No orders of the defined types were placed in this encounter.  No orders of the defined types were placed in this encounter.    History of Present Illness:    Latoya Curry is a 84 y.o. female who is being seen today for the evaluation of frequent PVCs, dizziness at times and chest discomfort at the request of Josetta Huddle, MD.  Patient is a pleasant 84 year old female.  She has past medical history of essential hypertension and dyslipidemia.  She mentions to me that she felt a little dizzy throughout the day when she was outside and feels that this may be because  of the heat and humidity.  She has never passed out.  She also complains of some substernal chest discomfort not related to exertion.  She gives history of acid reflux and tells me that she has had this problem for a long time.  No orthopnea or PND.  At the time of my evaluation, the patient is alert awake oriented and in no distress.  Past Medical History:  Diagnosis Date  . Chronic low back pain   . Diverticulitis   . DJD (degenerative joint disease) of knee   . Hypercholesteremia   . Hypertension   . Neurodermatitis   . Obesity   . Osteopenia   . Recurrent UTI   . Transient global amnesia     Past Surgical History:  Procedure Laterality Date  . TOTAL KNEE ARTHROPLASTY     bilaterallly    Current Medications: Current Meds  Medication Sig  . aspirin 81 MG tablet Take 81 mg by mouth. Three times per wk  . atenolol-chlorthalidone (TENORETIC) 50-25 MG per tablet Take 1 tablet by mouth daily.  . cholecalciferol (VITAMIN D) 1000 UNITS tablet Take 1,000 Units by mouth daily.  . citalopram (CELEXA) 20 MG tablet Take 20 mg by mouth daily.  . fluticasone (FLONASE) 50 MCG/ACT nasal spray Place 2 sprays into the nose daily.  Marland Kitchen omeprazole (PRILOSEC) 20 MG capsule Take 30- 60 min before your first and last meals of the day  .  ondansetron (ZOFRAN ODT) 4 MG disintegrating tablet 4mg  ODT q8 hours prn nausea/vomit  . rosuvastatin (CRESTOR) 10 MG tablet Take 5 mg by mouth 3 (three) times a week.   . [DISCONTINUED] albuterol (PROVENTIL HFA;VENTOLIN HFA) 108 (90 Base) MCG/ACT inhaler Inhale 2 puffs into the lungs every 6 (six) hours as needed for wheezing or shortness of breath.  . [DISCONTINUED] naproxen sodium (ANAPROX) 220 MG tablet Take 220 mg by mouth 2 (two) times daily with a meal.     Allergies:   Codeine; Bactrim [sulfamethoxazole-trimethoprim]; Zestril [lisinopril]; and Zocor [simvastatin]   Social History   Socioeconomic History  . Marital status: Married    Spouse name: Not on  file  . Number of children: 3  . Years of education: Not on file  . Highest education level: Not on file  Occupational History  . 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 Financial resource strain: Not on file  . Food insecurity:    Worry: Not on file    Inability: Not on file  . Transportation needs:    Medical: Not on file    Non-medical: Not on file  Tobacco Use  . Smoking status: Never Smoker  . Smokeless tobacco: Never Used  Substance and Sexual Activity  . Alcohol use: No  . Drug use: No  . Sexual activity: Not on file  Lifestyle  . Physical activity:    Days per week: Not on file    Minutes per session: Not on file  . Stress: Not on file  Relationships  . Social connections:    Talks on phone: Not on file    Gets together: Not on file    Attends religious service: Not on file    Active member of club or organization: Not on file    Attends meetings of clubs or organizations: Not on file    Relationship status: Not on file  Other Topics Concern  . Not on file  Social History Narrative  . Not on file     Family History: The patient's family history includes Heart disease in her father; Leukemia in her mother.  ROS:   Please see the history of present illness.    All other systems reviewed and are negative.  EKGs/Labs/Other Studies Reviewed:    The following studies were reviewed today: EKG reveals sinus rhythm with frequent PVCs and nonspecific ST-T changes.   Recent Labs: No results found for requested labs within last 8760 hours.  Recent Lipid Panel No results found for: CHOL, TRIG, HDL, CHOLHDL, VLDL, LDLCALC, LDLDIRECT  Physical Exam:    VS:  BP 126/78 (BP Location: Right Arm, Patient Position: Sitting, Cuff Size: Normal)   Pulse 91   Ht 5\' 6"  (1.676 m)   Wt 153 lb 1.9 oz (69.5 kg)   SpO2 95%   BMI 24.71 kg/m     Wt Readings from Last 3 Encounters:  08/05/17 153 lb 1.9 oz (69.5 kg)  06/26/17 162 lb (73.5 kg)    06/12/16 159 lb (72.1 kg)     GEN: Patient is in no acute distress HEENT: Normal NECK: No JVD; No carotid bruits LYMPHATICS: No lymphadenopathy CARDIAC: S1 S2 regular, 2/6 systolic murmur at the apex. RESPIRATORY:  Clear to auscultation without rales, wheezing or rhonchi  ABDOMEN: Soft, non-tender, non-distended MUSCULOSKELETAL:  No edema; No deformity  SKIN: Warm and dry NEUROLOGIC:  Alert and oriented x 3 PSYCHIATRIC:  Normal affect    Signed, Sunny Schlein  Reuel Derby, MD  08/05/2017 4:50 PM    [place]

## 2017-08-05 NOTE — Addendum Note (Signed)
Addended by: Stevan Born on: 08/05/2017 05:02 PM   Modules accepted: Orders

## 2017-08-07 ENCOUNTER — Telehealth: Payer: Self-pay | Admitting: *Deleted

## 2017-08-07 NOTE — Telephone Encounter (Signed)
Called to see how patient was feeling. Patient states she is feeling much better and is no longer feeling weak. She reports her blood pressure was 136/94 and 138/86 this morning after we got off the phone earlier. She has eaten breakfast, hydrated, and taken her medications. No further questions.

## 2017-08-07 NOTE — Telephone Encounter (Signed)
Patient states that she has been feeling good since her office visit on Wednesday, 08/05/17. However, this morning after getting up and getting to her chair she is complaining of weakness. Patient states, "It's just a weird feeling. I feel weak and I don't know how else to explain it. It has been going on for the past 10 minutes." Patient denies chest pain or discomfort of any type. Patient reports that she has not yet taken any of her medications. She has not checked her blood pressure and she has not eaten or drank anything besides a cup of coffee. Advised patient to take her blood pressure, eat breakfast, and drink some water. Informed patient that I would call her back to see if she is feeling any better later this morning. Advised patient to call back if she had any questions or concerns any sooner. Patient verbalized understanding. No further questions.

## 2017-08-13 ENCOUNTER — Telehealth (HOSPITAL_COMMUNITY): Payer: Self-pay | Admitting: *Deleted

## 2017-08-13 NOTE — Telephone Encounter (Signed)
Left message on voicemail per DPR in reference to upcoming appointment scheduled on 08/18/17 with detailed instructions given per Myocardial Perfusion Study Information Sheet for the test. LM to arrive 15 minutes early, and that it is imperative to arrive on time for appointment to keep from having the test rescheduled. If you need to cancel or reschedule your appointment, please call the office within 24 hours of your appointment. Failure to do so may result in a cancellation of your appointment, and a $50 no show fee. Phone number given for call back for any questions. Kirstie Peri

## 2017-08-18 ENCOUNTER — Other Ambulatory Visit: Payer: Self-pay

## 2017-08-18 ENCOUNTER — Ambulatory Visit (HOSPITAL_COMMUNITY): Payer: Medicare HMO | Attending: Cardiovascular Disease

## 2017-08-18 ENCOUNTER — Telehealth: Payer: Self-pay

## 2017-08-18 ENCOUNTER — Ambulatory Visit (HOSPITAL_BASED_OUTPATIENT_CLINIC_OR_DEPARTMENT_OTHER): Payer: Medicare HMO

## 2017-08-18 ENCOUNTER — Ambulatory Visit (INDEPENDENT_AMBULATORY_CARE_PROVIDER_SITE_OTHER): Payer: Medicare HMO

## 2017-08-18 DIAGNOSIS — I493 Ventricular premature depolarization: Secondary | ICD-10-CM

## 2017-08-18 DIAGNOSIS — I10 Essential (primary) hypertension: Secondary | ICD-10-CM | POA: Diagnosis not present

## 2017-08-18 DIAGNOSIS — R0789 Other chest pain: Secondary | ICD-10-CM | POA: Diagnosis not present

## 2017-08-18 DIAGNOSIS — E785 Hyperlipidemia, unspecified: Secondary | ICD-10-CM | POA: Insufficient documentation

## 2017-08-18 DIAGNOSIS — I77810 Thoracic aortic ectasia: Secondary | ICD-10-CM

## 2017-08-18 DIAGNOSIS — I7789 Other specified disorders of arteries and arterioles: Secondary | ICD-10-CM

## 2017-08-18 LAB — MYOCARDIAL PERFUSION IMAGING
LV dias vol: 86 mL (ref 46–106)
LV sys vol: 45 mL
Peak HR: 97 {beats}/min
RATE: 0.3
Rest HR: 75 {beats}/min
SDS: 5
SRS: 6
SSS: 11
TID: 0.92

## 2017-08-18 MED ORDER — REGADENOSON 0.4 MG/5ML IV SOLN
0.4000 mg | Freq: Once | INTRAVENOUS | Status: AC
  Administered 2017-08-18: 0.4 mg via INTRAVENOUS

## 2017-08-18 MED ORDER — TECHNETIUM TC 99M TETROFOSMIN IV KIT
10.7000 | PACK | Freq: Once | INTRAVENOUS | Status: AC | PRN
  Administered 2017-08-18: 10.7 via INTRAVENOUS
  Filled 2017-08-18: qty 11

## 2017-08-18 MED ORDER — AMINOPHYLLINE 25 MG/ML IV SOLN
75.0000 mg | Freq: Once | INTRAVENOUS | Status: AC
  Administered 2017-08-18: 75 mg via INTRAVENOUS

## 2017-08-18 MED ORDER — TECHNETIUM TC 99M TETROFOSMIN IV KIT
31.1000 | PACK | Freq: Once | INTRAVENOUS | Status: AC | PRN
  Administered 2017-08-18: 31.1 via INTRAVENOUS
  Filled 2017-08-18: qty 32

## 2017-08-18 NOTE — Telephone Encounter (Signed)
-----   Message from Jenean Lindau, MD sent at 08/18/2017  1:52 PM EDT ----- Inform patient about echo and moderate aortic regurgitation.  The ascending aorta and root are dilated and she will need a CT scan of the chest with contrast to evaluate this further. Jenean Lindau, MD 08/18/2017 1:51 PM

## 2017-08-18 NOTE — Telephone Encounter (Signed)
Left message to return call to discuss results.

## 2017-08-19 DIAGNOSIS — I7789 Other specified disorders of arteries and arterioles: Secondary | ICD-10-CM

## 2017-08-19 DIAGNOSIS — I77810 Thoracic aortic ectasia: Secondary | ICD-10-CM

## 2017-08-19 HISTORY — DX: Thoracic aortic ectasia: I77.810

## 2017-08-19 HISTORY — DX: Other specified disorders of arteries and arterioles: I77.89

## 2017-08-19 NOTE — Telephone Encounter (Signed)
Patient informed of echocardiogram results and advised that Dr. Geraldo Pitter recommends further evaluation with CT chest with contrast. Patient agreeable and verbalized understanding. Will send for precert and schedule accordingly. No further questions.

## 2017-08-19 NOTE — Telephone Encounter (Signed)
Patient called back asking to change location of CT chest with contrast from [facility] to [facility]. Order has been changed and precert team made aware.

## 2017-08-19 NOTE — Telephone Encounter (Signed)
Pt returned call, let her know we would get back to her today.

## 2017-08-20 ENCOUNTER — Telehealth: Payer: Self-pay

## 2017-08-20 ENCOUNTER — Other Ambulatory Visit: Payer: Self-pay

## 2017-08-20 DIAGNOSIS — I7789 Other specified disorders of arteries and arterioles: Secondary | ICD-10-CM

## 2017-08-20 NOTE — Telephone Encounter (Signed)
Left voicemail regarding cardiac results.

## 2017-08-20 NOTE — Telephone Encounter (Signed)
-----   Message from Jenean Lindau, MD sent at 08/18/2017  1:52 PM EDT ----- Inform patient about echo and moderate aortic regurgitation.  The ascending aorta and root are dilated and she will need a CT scan of the chest with contrast to evaluate this further. Jenean Lindau, MD 08/18/2017 1:51 PM

## 2017-08-21 ENCOUNTER — Telehealth: Payer: Self-pay

## 2017-08-21 ENCOUNTER — Ambulatory Visit
Admission: RE | Admit: 2017-08-21 | Discharge: 2017-08-21 | Disposition: A | Discharge status: Home / Self Care | Payer: Medicare HMO | Source: Ambulatory Visit | Attending: Cardiology | Admitting: Cardiology

## 2017-08-21 DIAGNOSIS — I712 Thoracic aortic aneurysm, without rupture: Secondary | ICD-10-CM | POA: Diagnosis not present

## 2017-08-21 MED ORDER — IOPAMIDOL (ISOVUE-370) INJECTION 76%
75.0000 mL | Freq: Once | INTRAVENOUS | Status: AC | PRN
  Administered 2017-08-21: 75 mL via INTRAVENOUS

## 2017-08-21 NOTE — Telephone Encounter (Signed)
Informed patient that the CTA had been approved and she may now call [facility] and have it scheduled.

## 2017-08-21 NOTE — Telephone Encounter (Signed)
-----   Message from Austin Miles, RN sent at 08/21/2017  1:28 PM EDT ----- Madaline Brilliant she can go right now if she wants.....  Mcarthur Rossetti HJSC#[id number] VALID 6-27 TO 09-19-17  ----- Message ----- From: Austin Miles, RN Sent: 08/20/2017   3:02 PM To: Lillia Pauls Subject: RE: Correction to precert for CT chest with #  Caryl Pina spoke with Dr. Geraldo Pitter and changed the order. Let me know if you have further questions. Thanks!  ----- Message ----- From: Lillia Pauls Sent: 08/20/2017   9:55 AM To: Austin Miles, RN, Jenean Lindau, MD Subject: RE: Correction to precert for CT chest with #  I spoke w/ radiologist tech at [facility].  Since pt's measurement is 31mm should do a CTA (w/wo cm) vs CT .  Ok to change? ----- Message ----- From: Austin Miles, RN Sent: 08/19/2017   4:06 PM To: [facility] Pre Cert/Auth Subject: Correction to precert for CT chest with cont#  Patient called back after I sent precert stating that she wanted to CT chest with contrast done at [facility] instead of [facility]. Location for the order has been changed as well. Thanks!

## 2017-08-24 ENCOUNTER — Telehealth: Payer: Self-pay | Admitting: Cardiology

## 2017-08-24 NOTE — Addendum Note (Signed)
Addended by: Austin Miles on: 08/24/2017 03:58 PM   Modules accepted: Orders

## 2017-08-24 NOTE — Telephone Encounter (Signed)
Patient is looking for CT results

## 2017-08-24 NOTE — Telephone Encounter (Signed)
Patient's daughter, Blair Promise, informed of results this afternoon as patient requested. Sheryl stated before hanging up the phone that she would discuss these results with patient after she got off work this evening.

## 2017-08-24 NOTE — Telephone Encounter (Signed)
Please call this patient.

## 2017-08-25 DIAGNOSIS — I7789 Other specified disorders of arteries and arterioles: Secondary | ICD-10-CM | POA: Diagnosis not present

## 2017-08-25 DIAGNOSIS — E782 Mixed hyperlipidemia: Secondary | ICD-10-CM | POA: Diagnosis not present

## 2017-08-26 LAB — LIPID PANEL
Chol/HDL Ratio: 3.8 ratio (ref 0.0–4.4)
Cholesterol, Total: 151 mg/dL (ref 100–199)
HDL: 40 mg/dL (ref >39)
LDL Calculated: 81 mg/dL (ref 0–99)
Triglycerides: 148 mg/dL (ref 0–149)
VLDL Cholesterol Cal: 30 mg/dL (ref 5–40)

## 2017-08-26 LAB — HEPATIC FUNCTION PANEL
ALT: 10 IU/L (ref 0–32)
AST: 26 IU/L (ref 0–40)
Albumin: 4.4 g/dL (ref 3.5–4.7)
Alkaline Phosphatase: 72 IU/L (ref 39–117)
Bilirubin Total: 0.6 mg/dL (ref 0.0–1.2)
Bilirubin, Direct: 0.16 mg/dL (ref 0.00–0.40)
Total Protein: 7.1 g/dL (ref 6.0–8.5)

## 2017-08-26 LAB — BASIC METABOLIC PANEL
BUN/Creatinine Ratio: 34 — ABNORMAL HIGH (ref 12–28)
BUN: 25 mg/dL (ref 8–27)
CO2: 25 mmol/L (ref 20–29)
Calcium: 10.1 mg/dL (ref 8.7–10.3)
Chloride: 102 mmol/L (ref 96–106)
Creatinine, Ser: 0.73 mg/dL (ref 0.57–1.00)
GFR calc Af Amer: 87 mL/min/{1.73_m2} (ref >59)
GFR calc non Af Amer: 75 mL/min/{1.73_m2} (ref >59)
Glucose: 105 mg/dL — ABNORMAL HIGH (ref 65–99)
Potassium: 3.7 mmol/L (ref 3.5–5.2)
Sodium: 144 mmol/L (ref 134–144)

## 2017-09-03 ENCOUNTER — Encounter: Payer: Self-pay | Admitting: Cardiothoracic Surgery

## 2017-09-03 ENCOUNTER — Institutional Professional Consult (permissible substitution): Payer: Medicare HMO | Admitting: Cardiothoracic Surgery

## 2017-09-03 ENCOUNTER — Other Ambulatory Visit: Payer: Self-pay

## 2017-09-03 VITALS — BP 132/80 | HR 61 | Resp 18 | Ht 66.0 in | Wt 157.0 lb

## 2017-09-03 DIAGNOSIS — I712 Thoracic aortic aneurysm, without rupture, unspecified: Secondary | ICD-10-CM

## 2017-09-03 NOTE — Progress Notes (Signed)
[address]       [place] [address]             [phone]        [place] H [place] Medical Record #[id number] Date of Birth: Feb 19, 1979  Referring: Revankar, Reita Cliche, MD Primary Care: Josetta Huddle, MD Primary Cardiologist:No primary care provider on file.  Chief Complaint:    Chief Complaint  Patient presents with  . Thoracic Aortic Aneurysm    new patient cosultation, CT Chest 08/21/17   Patient examined, images of CTA of thoracic aorta and images of echocardiogram personally reviewed and counseled with patient and family History of Present Illness:     84 year old hypertensive non-smoker with recent diagnosis of asymptomatic fusiform ascending aneurysm measuring 5.5 cm maximal diameter.  No prior scans with which to compare.  No family history of thoracic abdominal aortic aneurysm disease or sudden death.  Coronary calcification is noted on CT scan.  The aortic arch and descending thoracic aorta are only mildly enlarged.  Echocardiogram shows normal LV systolic function, LVH and mild-moderate aortic insufficiency.  Patient is in sinus rhythm and is on no anticoagulation other than 81 mg aspirin.  Patient denies chest pain or upper back pain.  The patient had the CT scan and echocardiogram after the episode of diaphoresis and dizziness following a dental extraction in a dentist office under local anesthesia.  Patient has a sedentary lifestyle in assisted living-[facility]. She does not drive and walks short distances. Current Activity/ Functional Status: Patient able to live in her apartment with ability to do ADLs with minimal assistance.  No recent hospitalizations.   Zubrod Score: At the time of surgery this patient's most appropriate activity status/level should be described as: []     0    Normal activity, no symptoms []     1    Restricted in physical strenuous activity but ambulatory, able to do out light work [x]     2    Ambulatory  and capable of self care, unable to do work activities, up and about                 more than 50%  Of the time                            []     3    Only limited self care, in bed greater than 50% of waking hours []     4    Completely disabled, no self care, confined to bed or chair []     5    Moribund  Past Medical History:  Diagnosis Date  . Chronic low back pain   . Diverticulitis   . DJD (degenerative joint disease) of knee   . Hypercholesteremia   . Hypertension   . Neurodermatitis   . Obesity   . Osteopenia   . Recurrent UTI   . Transient global amnesia     Past Surgical History:  Procedure Laterality Date  . TOTAL KNEE ARTHROPLASTY     bilaterallly    Social History   Tobacco Use  Smoking Status Never Smoker  Smokeless Tobacco Never Used   Social History   Substance and Sexual Activity  Alcohol Use No     Allergies  Allergen Reactions  . Codeine   . Bactrim [Sulfamethoxazole-Trimethoprim]   . Zestril [Lisinopril] Cough  . Zocor [Simvastatin]     Current Outpatient  Medications  Medication Sig Dispense Refill  . aspirin 81 MG tablet Take 81 mg by mouth. Three times per wk    . atenolol-chlorthalidone (TENORETIC) 50-25 MG per tablet Take 1 tablet by mouth daily.    . cholecalciferol (VITAMIN D) 1000 UNITS tablet Take 1,000 Units by mouth daily.    . citalopram (CELEXA) 20 MG tablet Take 20 mg by mouth daily.    . fesoterodine (TOVIAZ) 4 MG TB24 tablet Take 4 mg by mouth daily.    . fluticasone (FLONASE) 50 MCG/ACT nasal spray Place 2 sprays into the nose daily.    . naproxen sodium (ALEVE) 220 MG tablet Take 220 mg by mouth daily as needed.    Marland Kitchen omeprazole (PRILOSEC) 20 MG capsule Take 30- 60 min before your first and last meals of the day    . ondansetron (ZOFRAN ODT) 4 MG disintegrating tablet 4mg  ODT q8 hours prn nausea/vomit 4 tablet 0  . potassium chloride 20 MEQ TBCR Take 20 mEq by mouth 2 (two) times daily. 14 tablet 0  . rosuvastatin (CRESTOR) 10  MG tablet Take 5 mg by mouth 3 (three) times a week.     . traMADol (ULTRAM) 50 MG tablet Take 50 mg by mouth every 6 (six) hours as needed.     No current facility-administered medications for this visit.      (Not in a hospital admission)  Family History  Problem Relation Age of Onset  . Heart disease Father   . Leukemia Mother      Review of Systems:   ROS Patient tolerated anesthesia for a sigmoid colectomy 15 years ago and remote bilateral knee replacements.  She has a pin in the left hip.    Cardiac Review of Systems: Y or  [    ]= no  Chest Pain [    ]  Resting SOB [   ] Exertional SOB  [  ]  Orthopnea [  ]   Pedal Edema [   ]    Palpitations [  ] Syncope  [  ]   Presyncope [ y after dental procedure when heart rate probably decreased]  General Review of Systems: [Y] = yes [  ]=no Constitional: recent weight change [  ]; anorexia [  ]; fatigue [  ]; nausea [  ]; night sweats [  ]; fever [  ]; or chills [  ]                                                               Dental: Last Dentist visit: 3 months  Eye : blurred vision [  ]; diplopia [   ]; vision changes [  ];  Amaurosis fugax[  ]; Resp: cough [  ];  wheezing[  ];  hemoptysis[  ]; shortness of breath[  ]; paroxysmal nocturnal dyspnea[  ]; dyspnea on exertion[  ]; or orthopnea[  ];  GI:  gallstones[  ], vomiting[  ];  dysphagia[  ]; melena[  ];  hematochezia [  ]; heartburn[reflux];   Hx of  Colonoscopy[  ]; GU: kidney stones [  ]; hematuria[  ];   dysuria [  ];  nocturia[  ];  history of     obstruction [  ]; urinary frequency [  ]  Skin: rash, swelling[  ];, hair loss[  ];  peripheral edema[  ];  or itching[  ]; Musculosketetal: myalgias[  ];  joint swelling[  ];  joint erythema[  ];  joint pain[  ];  back pain[  ];  Heme/Lymph: bruising[  ];  bleeding[  ];  anemia[  ];  Neuro: TIA[  ];  headaches[  ];  stroke[  ];  vertigo[  ];  seizures[  ];   paresthesias[  ];  difficulty walking[   ];  Psych:depression[  ]; anxiety[  ];  Endocrine: diabetes[  ];  thyroid dysfunction[  ];                Physical Exam: BP 132/80 (BP Location: Right Arm, Patient Position: Sitting, Cuff Size: Normal)   Pulse 61   Resp 18   Ht 5\' 6"  (1.676 m)   Wt 157 lb (71.2 kg)   SpO2 93% Comment: RA  BMI 25.34 kg/m         Exam    General- alert and comfortable    Neck- no JVD, no cervical adenopathy palpable, no carotid bruit   Lungs- clear without rales, wheezes   Cor- regular rate and rhythm, 6-0/6 diastolic murmur of AI , no gallop   Abdomen- soft, non-tender no pulsatile mass   Extremities - warm, non-tender, minimal edema   Neuro- oriented, appropriate, no focal weakness  Diagnostic Studies & Laboratory data:     Recent Radiology Findings:  CTA of thoracic aorta images personally reviewed and demonstrated to patient and her family.  5.5 cm diameter fusiform ascending aneurysm   I have independently reviewed the above radiologic studies and discussed with the patient   Recent Lab Findings: Lab Results  Component Value Date   WBC 6.0 06/12/2016   HGB 15.3 (H) 06/12/2016   HCT 44.9 06/12/2016   PLT 220 06/12/2016   GLUCOSE 105 (H) 08/25/2017   CHOL 151 08/25/2017   TRIG 148 08/25/2017   HDL 40 08/25/2017   LDLCALC 81 08/25/2017   ALT 10 08/25/2017   AST 26 08/25/2017   NA 144 08/25/2017   K 3.7 08/25/2017   CL 102 08/25/2017   CREATININE 0.73 08/25/2017   BUN 25 08/25/2017   CO2 25 08/25/2017   INR 1.1 01/08/2007      Assessment / Plan:    Hypertension Asymptomatic ascending aortic fusiform aneurysm, diameter 5.5 cm without penetrating ulcer or hematoma. Mild-moderate aortic insufficiency from aortic root dilatation  I discussed the risk of aortic dissection with the patient and her family.  At her BSA 1.8 m and with aortic diameter 5.5 cm risk of dissection is 8%.  Fortunately patient is extremely compliant with her blood pressure medications and her blood  pressure has been very well controlled.  The surgical risk in this 84 year old frail woman with poor pulmonary mechanics and reduced exercise tolerance would be 20-25% for aortic root and ascending aortic replacement with circulatory arrest.  I have recommended continued medical therapy with six-month surveillance scans to follow her aorta.  She understands importance of not missing her medications and to have the nurse at her assisted living facility check and record her blood pressure once a week with a goal of keeping systolic pressure less than 140 mmHg  I will see her back in 6 months with a repeat CTA of her aorta.   @ME1 @ 09/03/2017 5:20 PM

## 2017-09-10 DIAGNOSIS — I712 Thoracic aortic aneurysm, without rupture: Secondary | ICD-10-CM | POA: Diagnosis not present

## 2017-09-10 DIAGNOSIS — R61 Generalized hyperhidrosis: Secondary | ICD-10-CM | POA: Diagnosis not present

## 2017-09-10 DIAGNOSIS — M5432 Sciatica, left side: Secondary | ICD-10-CM | POA: Diagnosis not present

## 2017-09-10 DIAGNOSIS — I10 Essential (primary) hypertension: Secondary | ICD-10-CM | POA: Diagnosis not present

## 2017-09-10 DIAGNOSIS — I499 Cardiac arrhythmia, unspecified: Secondary | ICD-10-CM | POA: Diagnosis not present

## 2017-10-02 DIAGNOSIS — N8111 Cystocele, midline: Secondary | ICD-10-CM | POA: Diagnosis not present

## 2017-10-16 DIAGNOSIS — I712 Thoracic aortic aneurysm, without rupture: Secondary | ICD-10-CM | POA: Diagnosis not present

## 2017-10-16 DIAGNOSIS — M8580 Other specified disorders of bone density and structure, unspecified site: Secondary | ICD-10-CM | POA: Diagnosis not present

## 2017-10-16 DIAGNOSIS — M5442 Lumbago with sciatica, left side: Secondary | ICD-10-CM | POA: Diagnosis not present

## 2017-10-16 DIAGNOSIS — I499 Cardiac arrhythmia, unspecified: Secondary | ICD-10-CM | POA: Diagnosis not present

## 2017-10-16 DIAGNOSIS — E7800 Pure hypercholesterolemia, unspecified: Secondary | ICD-10-CM | POA: Diagnosis not present

## 2017-10-16 DIAGNOSIS — I10 Essential (primary) hypertension: Secondary | ICD-10-CM | POA: Diagnosis not present

## 2017-10-20 DIAGNOSIS — I712 Thoracic aortic aneurysm, without rupture: Secondary | ICD-10-CM | POA: Diagnosis not present

## 2017-10-20 DIAGNOSIS — M8580 Other specified disorders of bone density and structure, unspecified site: Secondary | ICD-10-CM | POA: Diagnosis not present

## 2017-10-20 DIAGNOSIS — I10 Essential (primary) hypertension: Secondary | ICD-10-CM | POA: Diagnosis not present

## 2017-10-20 DIAGNOSIS — M5442 Lumbago with sciatica, left side: Secondary | ICD-10-CM | POA: Diagnosis not present

## 2017-10-20 DIAGNOSIS — I499 Cardiac arrhythmia, unspecified: Secondary | ICD-10-CM | POA: Diagnosis not present

## 2017-10-20 DIAGNOSIS — E7800 Pure hypercholesterolemia, unspecified: Secondary | ICD-10-CM | POA: Diagnosis not present

## 2017-10-22 DIAGNOSIS — I10 Essential (primary) hypertension: Secondary | ICD-10-CM | POA: Diagnosis not present

## 2017-10-22 DIAGNOSIS — I499 Cardiac arrhythmia, unspecified: Secondary | ICD-10-CM | POA: Diagnosis not present

## 2017-10-22 DIAGNOSIS — E7800 Pure hypercholesterolemia, unspecified: Secondary | ICD-10-CM | POA: Diagnosis not present

## 2017-10-22 DIAGNOSIS — M5442 Lumbago with sciatica, left side: Secondary | ICD-10-CM | POA: Diagnosis not present

## 2017-10-22 DIAGNOSIS — M8580 Other specified disorders of bone density and structure, unspecified site: Secondary | ICD-10-CM | POA: Diagnosis not present

## 2017-10-22 DIAGNOSIS — I712 Thoracic aortic aneurysm, without rupture: Secondary | ICD-10-CM | POA: Diagnosis not present

## 2017-10-27 DIAGNOSIS — I499 Cardiac arrhythmia, unspecified: Secondary | ICD-10-CM | POA: Diagnosis not present

## 2017-10-27 DIAGNOSIS — E7800 Pure hypercholesterolemia, unspecified: Secondary | ICD-10-CM | POA: Diagnosis not present

## 2017-10-27 DIAGNOSIS — I10 Essential (primary) hypertension: Secondary | ICD-10-CM | POA: Diagnosis not present

## 2017-10-27 DIAGNOSIS — M8580 Other specified disorders of bone density and structure, unspecified site: Secondary | ICD-10-CM | POA: Diagnosis not present

## 2017-10-27 DIAGNOSIS — M5442 Lumbago with sciatica, left side: Secondary | ICD-10-CM | POA: Diagnosis not present

## 2017-10-27 DIAGNOSIS — I712 Thoracic aortic aneurysm, without rupture: Secondary | ICD-10-CM | POA: Diagnosis not present

## 2017-10-29 DIAGNOSIS — I712 Thoracic aortic aneurysm, without rupture: Secondary | ICD-10-CM | POA: Diagnosis not present

## 2017-10-29 DIAGNOSIS — I10 Essential (primary) hypertension: Secondary | ICD-10-CM | POA: Diagnosis not present

## 2017-10-29 DIAGNOSIS — M5442 Lumbago with sciatica, left side: Secondary | ICD-10-CM | POA: Diagnosis not present

## 2017-10-29 DIAGNOSIS — E7800 Pure hypercholesterolemia, unspecified: Secondary | ICD-10-CM | POA: Diagnosis not present

## 2017-10-29 DIAGNOSIS — M8580 Other specified disorders of bone density and structure, unspecified site: Secondary | ICD-10-CM | POA: Diagnosis not present

## 2017-10-29 DIAGNOSIS — I499 Cardiac arrhythmia, unspecified: Secondary | ICD-10-CM | POA: Diagnosis not present

## 2017-11-03 ENCOUNTER — Ambulatory Visit: Payer: Medicare HMO | Admitting: Cardiology

## 2017-11-03 DIAGNOSIS — E7800 Pure hypercholesterolemia, unspecified: Secondary | ICD-10-CM | POA: Diagnosis not present

## 2017-11-03 DIAGNOSIS — I499 Cardiac arrhythmia, unspecified: Secondary | ICD-10-CM | POA: Diagnosis not present

## 2017-11-03 DIAGNOSIS — I10 Essential (primary) hypertension: Secondary | ICD-10-CM | POA: Diagnosis not present

## 2017-11-03 DIAGNOSIS — M8580 Other specified disorders of bone density and structure, unspecified site: Secondary | ICD-10-CM | POA: Diagnosis not present

## 2017-11-03 DIAGNOSIS — M5442 Lumbago with sciatica, left side: Secondary | ICD-10-CM | POA: Diagnosis not present

## 2017-11-03 DIAGNOSIS — I712 Thoracic aortic aneurysm, without rupture: Secondary | ICD-10-CM | POA: Diagnosis not present

## 2017-11-04 ENCOUNTER — Encounter: Payer: Self-pay | Admitting: Cardiology

## 2017-11-04 ENCOUNTER — Ambulatory Visit: Payer: Medicare HMO | Admitting: Cardiology

## 2017-11-04 VITALS — BP 130/62 | HR 87 | Ht 66.0 in | Wt 152.0 lb

## 2017-11-04 DIAGNOSIS — E782 Mixed hyperlipidemia: Secondary | ICD-10-CM

## 2017-11-04 DIAGNOSIS — I10 Essential (primary) hypertension: Secondary | ICD-10-CM

## 2017-11-04 DIAGNOSIS — I77810 Thoracic aortic ectasia: Secondary | ICD-10-CM

## 2017-11-04 NOTE — Progress Notes (Signed)
Cardiology Office Note:    Date:  11/04/2017   ID:  Latoya Curry, DOB 07-15-1930, MRN [id number]  PCP:  Josetta Huddle, MD  Cardiologist:  Jenean Lindau, MD   Referring MD: Josetta Huddle, MD    ASSESSMENT:    1. Ascending aorta dilation (HCC)   2. Essential hypertension   3. Mixed dyslipidemia    PLAN:    In order of problems listed above:  1. Secondary prevention stressed to the patient.  Importance of compliance with diet and medications stressed.  I discussed again the aneurysm issues and the opinion of the surgeon and she understands.  Her blood pressure is stable.  Diet was discussed with dyslipidemia and she is on statin therapy.  She will be seen in follow-up appointment in 6 months or earlier if she has any concerns.   Medication Adjustments/Labs and Tests Ordered: Current medicines are reviewed at length with the patient today.  Concerns regarding medicines are outlined above.  No orders of the defined types were placed in this encounter.  No orders of the defined types were placed in this encounter.    No chief complaint on file.    History of Present Illness:    Latoya Curry is a 84 y.o. female.  Patient has history of ascending aortic aneurysm and has been deemed very high risk by her cardiac surgeon.  He wants to monitor her closely.  Meanwhile the patient does not have any symptoms.  No chest pain orthopnea PND or any such symptoms and she is a active lady.  She is accompanied by her daughter today.  At the time of my evaluation, the patient is alert awake oriented and in no distress.  Past Medical History:  Diagnosis Date  . Chronic low back pain   . Diverticulitis   . DJD (degenerative joint disease) of knee   . Hypercholesteremia   . Hypertension   . Neurodermatitis   . Obesity   . Osteopenia   . Recurrent UTI   . Transient global amnesia     Past Surgical History:  Procedure Laterality Date  . TOTAL KNEE ARTHROPLASTY     bilaterallly     Current Medications: Current Meds  Medication Sig  . aspirin 81 MG tablet Take 81 mg by mouth. Three times per wk  . atenolol-chlorthalidone (TENORETIC) 50-25 MG per tablet Take 1 tablet by mouth daily.  . cholecalciferol (VITAMIN D) 1000 UNITS tablet Take 1,000 Units by mouth daily.  . citalopram (CELEXA) 20 MG tablet Take 20 mg by mouth daily.  . fesoterodine (TOVIAZ) 4 MG TB24 tablet Take 4 mg by mouth daily.  . fluticasone (FLONASE) 50 MCG/ACT nasal spray Place 2 sprays into the nose daily.  . naproxen sodium (ALEVE) 220 MG tablet Take 220 mg by mouth daily as needed.  Marland Kitchen omeprazole (PRILOSEC) 20 MG capsule Take 30- 60 min before your first and last meals of the day  . ondansetron (ZOFRAN ODT) 4 MG disintegrating tablet 4mg  ODT q8 hours prn nausea/vomit  . potassium chloride 20 MEQ TBCR Take 20 mEq by mouth 2 (two) times daily.  . rosuvastatin (CRESTOR) 10 MG tablet Take 5 mg by mouth 3 (three) times a week.   . traMADol (ULTRAM) 50 MG tablet Take 50 mg by mouth every 6 (six) hours as needed.     Allergies:   Codeine; Bactrim [sulfamethoxazole-trimethoprim]; Zestril [lisinopril]; and Zocor [simvastatin]   Social History   Socioeconomic History  . Marital status: Married  Spouse name: Not on file  . Number of children: 3  . Years of education: Not on file  . Highest education level: Not on file  Occupational History  . 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 Financial resource strain: Not on file  . Food insecurity:    Worry: Not on file    Inability: Not on file  . Transportation needs:    Medical: Not on file    Non-medical: Not on file  Tobacco Use  . Smoking status: Never Smoker  . Smokeless tobacco: Never Used  Substance and Sexual Activity  . Alcohol use: No  . Drug use: No  . Sexual activity: Not on file  Lifestyle  . Physical activity:    Days per week: Not on file    Minutes per session: Not on file  . Stress: Not on  file  Relationships  . Social connections:    Talks on phone: Not on file    Gets together: Not on file    Attends religious service: Not on file    Active member of club or organization: Not on file    Attends meetings of clubs or organizations: Not on file    Relationship status: Not on file  Other Topics Concern  . Not on file  Social History Narrative  . Not on file     Family History: The patient's family history includes Heart disease in her father; Leukemia in her mother.  ROS:   Please see the history of present illness.    All other systems reviewed and are negative.  EKGs/Labs/Other Studies Reviewed:    The following studies were reviewed today: I discussed the findings with the patient at extensive   Recent Labs: 08/25/2017: ALT 10; BUN 25; Creatinine, Ser 0.73; Potassium 3.7; Sodium 144  Recent Lipid Panel    Component Value Date/Time   CHOL 151 08/25/2017 0910   TRIG 148 08/25/2017 0910   HDL 40 08/25/2017 0910   CHOLHDL 3.8 08/25/2017 0910   LDLCALC 81 08/25/2017 0910    Physical Exam:    VS:  BP 130/62 (BP Location: Right Arm, Patient Position: Sitting, Cuff Size: Normal)   Pulse 87   Ht 5\' 6"  (1.676 m)   Wt 152 lb (68.9 kg)   SpO2 96%   BMI 24.53 kg/m     Wt Readings from Last 3 Encounters:  11/04/17 152 lb (68.9 kg)  09/03/17 157 lb (71.2 kg)  08/18/17 153 lb (69.4 kg)     GEN: Patient is in no acute distress HEENT: Normal NECK: No JVD; No carotid bruits LYMPHATICS: No lymphadenopathy CARDIAC: Hear sounds regular, 2/6 systolic murmur at the apex. RESPIRATORY:  Clear to auscultation without rales, wheezing or rhonchi  ABDOMEN: Soft, non-tender, non-distended MUSCULOSKELETAL:  No edema; No deformity  SKIN: Warm and dry NEUROLOGIC:  Alert and oriented x 3 PSYCHIATRIC:  Normal affect   Signed, Jenean Lindau, MD  11/04/2017 3:52 PM    [facility]

## 2017-11-04 NOTE — Patient Instructions (Signed)

## 2017-11-05 DIAGNOSIS — I499 Cardiac arrhythmia, unspecified: Secondary | ICD-10-CM | POA: Diagnosis not present

## 2017-11-05 DIAGNOSIS — I10 Essential (primary) hypertension: Secondary | ICD-10-CM | POA: Diagnosis not present

## 2017-11-05 DIAGNOSIS — M5442 Lumbago with sciatica, left side: Secondary | ICD-10-CM | POA: Diagnosis not present

## 2017-11-05 DIAGNOSIS — E7800 Pure hypercholesterolemia, unspecified: Secondary | ICD-10-CM | POA: Diagnosis not present

## 2017-11-05 DIAGNOSIS — I712 Thoracic aortic aneurysm, without rupture: Secondary | ICD-10-CM | POA: Diagnosis not present

## 2017-11-05 DIAGNOSIS — M8580 Other specified disorders of bone density and structure, unspecified site: Secondary | ICD-10-CM | POA: Diagnosis not present

## 2017-11-09 DIAGNOSIS — M5442 Lumbago with sciatica, left side: Secondary | ICD-10-CM | POA: Diagnosis not present

## 2017-11-09 DIAGNOSIS — M8580 Other specified disorders of bone density and structure, unspecified site: Secondary | ICD-10-CM | POA: Diagnosis not present

## 2017-11-09 DIAGNOSIS — I712 Thoracic aortic aneurysm, without rupture: Secondary | ICD-10-CM | POA: Diagnosis not present

## 2017-11-09 DIAGNOSIS — I10 Essential (primary) hypertension: Secondary | ICD-10-CM | POA: Diagnosis not present

## 2017-11-09 DIAGNOSIS — I499 Cardiac arrhythmia, unspecified: Secondary | ICD-10-CM | POA: Diagnosis not present

## 2017-11-09 DIAGNOSIS — E7800 Pure hypercholesterolemia, unspecified: Secondary | ICD-10-CM | POA: Diagnosis not present

## 2017-11-12 DIAGNOSIS — M5442 Lumbago with sciatica, left side: Secondary | ICD-10-CM | POA: Diagnosis not present

## 2017-11-12 DIAGNOSIS — I10 Essential (primary) hypertension: Secondary | ICD-10-CM | POA: Diagnosis not present

## 2017-11-12 DIAGNOSIS — E7800 Pure hypercholesterolemia, unspecified: Secondary | ICD-10-CM | POA: Diagnosis not present

## 2017-11-12 DIAGNOSIS — I499 Cardiac arrhythmia, unspecified: Secondary | ICD-10-CM | POA: Diagnosis not present

## 2017-11-12 DIAGNOSIS — M8580 Other specified disorders of bone density and structure, unspecified site: Secondary | ICD-10-CM | POA: Diagnosis not present

## 2017-11-12 DIAGNOSIS — I712 Thoracic aortic aneurysm, without rupture: Secondary | ICD-10-CM | POA: Diagnosis not present

## 2017-12-09 DIAGNOSIS — Z1231 Encounter for screening mammogram for malignant neoplasm of breast: Secondary | ICD-10-CM | POA: Diagnosis not present

## 2018-01-05 DIAGNOSIS — M79641 Pain in right hand: Secondary | ICD-10-CM | POA: Diagnosis not present

## 2018-01-19 DIAGNOSIS — J309 Allergic rhinitis, unspecified: Secondary | ICD-10-CM | POA: Diagnosis not present

## 2018-01-19 DIAGNOSIS — E559 Vitamin D deficiency, unspecified: Secondary | ICD-10-CM | POA: Diagnosis not present

## 2018-01-19 DIAGNOSIS — I499 Cardiac arrhythmia, unspecified: Secondary | ICD-10-CM | POA: Diagnosis not present

## 2018-01-19 DIAGNOSIS — I10 Essential (primary) hypertension: Secondary | ICD-10-CM | POA: Diagnosis not present

## 2018-01-19 DIAGNOSIS — I712 Thoracic aortic aneurysm, without rupture: Secondary | ICD-10-CM | POA: Diagnosis not present

## 2018-01-19 DIAGNOSIS — N3942 Incontinence without sensory awareness: Secondary | ICD-10-CM | POA: Diagnosis not present

## 2018-01-19 DIAGNOSIS — R634 Abnormal weight loss: Secondary | ICD-10-CM | POA: Diagnosis not present

## 2018-01-19 DIAGNOSIS — Z0001 Encounter for general adult medical examination with abnormal findings: Secondary | ICD-10-CM | POA: Diagnosis not present

## 2018-01-29 ENCOUNTER — Other Ambulatory Visit: Payer: Self-pay | Admitting: *Deleted

## 2018-01-29 DIAGNOSIS — I712 Thoracic aortic aneurysm, without rupture, unspecified: Secondary | ICD-10-CM

## 2018-02-03 DIAGNOSIS — N8111 Cystocele, midline: Secondary | ICD-10-CM | POA: Diagnosis not present

## 2018-02-03 DIAGNOSIS — R634 Abnormal weight loss: Secondary | ICD-10-CM | POA: Diagnosis not present

## 2018-02-03 DIAGNOSIS — R7989 Other specified abnormal findings of blood chemistry: Secondary | ICD-10-CM | POA: Diagnosis not present

## 2018-02-04 ENCOUNTER — Other Ambulatory Visit (HOSPITAL_COMMUNITY): Payer: Self-pay | Admitting: Internal Medicine

## 2018-02-04 DIAGNOSIS — R7989 Other specified abnormal findings of blood chemistry: Secondary | ICD-10-CM

## 2018-02-18 ENCOUNTER — Encounter (HOSPITAL_COMMUNITY)
Admission: RE | Admit: 2018-02-18 | Discharge: 2018-02-18 | Disposition: A | Discharge status: Home / Self Care | Payer: Medicare HMO | Source: Ambulatory Visit | Attending: Internal Medicine | Admitting: Internal Medicine

## 2018-02-18 DIAGNOSIS — R7989 Other specified abnormal findings of blood chemistry: Secondary | ICD-10-CM

## 2018-02-18 MED ORDER — SODIUM IODIDE I 131 CAPSULE
17.2000 | Freq: Once | INTRAVENOUS | Status: AC | PRN
  Administered 2018-02-18: 17.2 via ORAL

## 2018-02-19 ENCOUNTER — Encounter (HOSPITAL_COMMUNITY)
Admission: RE | Admit: 2018-02-19 | Discharge: 2018-02-19 | Disposition: A | Discharge status: Home / Self Care | Payer: Medicare HMO | Source: Ambulatory Visit | Attending: Internal Medicine | Admitting: Internal Medicine

## 2018-02-19 DIAGNOSIS — E0590 Thyrotoxicosis, unspecified without thyrotoxic crisis or storm: Secondary | ICD-10-CM | POA: Diagnosis not present

## 2018-02-19 MED ORDER — SODIUM PERTECHNETATE TC 99M INJECTION
10.0000 | Freq: Once | INTRAVENOUS | Status: AC | PRN
  Administered 2018-02-19: 10 via INTRAVENOUS

## 2018-02-23 DIAGNOSIS — E0590 Thyrotoxicosis, unspecified without thyrotoxic crisis or storm: Secondary | ICD-10-CM | POA: Diagnosis not present

## 2018-02-23 DIAGNOSIS — B349 Viral infection, unspecified: Secondary | ICD-10-CM | POA: Diagnosis not present

## 2018-02-23 DIAGNOSIS — J40 Bronchitis, not specified as acute or chronic: Secondary | ICD-10-CM | POA: Diagnosis not present

## 2018-02-25 ENCOUNTER — Other Ambulatory Visit (HOSPITAL_COMMUNITY): Payer: Self-pay | Admitting: Internal Medicine

## 2018-02-25 DIAGNOSIS — E0500 Thyrotoxicosis with diffuse goiter without thyrotoxic crisis or storm: Secondary | ICD-10-CM

## 2018-03-10 ENCOUNTER — Encounter: Payer: Medicare HMO | Admitting: Cardiothoracic Surgery

## 2018-03-10 ENCOUNTER — Encounter (HOSPITAL_COMMUNITY)
Admission: RE | Admit: 2018-03-10 | Discharge: 2018-03-10 | Disposition: A | Discharge status: Home / Self Care | Payer: Medicare HMO | Source: Ambulatory Visit | Attending: Internal Medicine | Admitting: Internal Medicine

## 2018-03-10 ENCOUNTER — Other Ambulatory Visit: Payer: Medicare HMO

## 2018-03-10 DIAGNOSIS — E0500 Thyrotoxicosis with diffuse goiter without thyrotoxic crisis or storm: Secondary | ICD-10-CM | POA: Diagnosis not present

## 2018-03-10 DIAGNOSIS — E0590 Thyrotoxicosis, unspecified without thyrotoxic crisis or storm: Secondary | ICD-10-CM | POA: Diagnosis not present

## 2018-03-10 MED ORDER — SODIUM IODIDE I 131 CAPSULE
15.0000 | Freq: Once | INTRAVENOUS | Status: AC | PRN
  Administered 2018-03-10: 15 via ORAL

## 2018-03-24 ENCOUNTER — Ambulatory Visit
Admission: RE | Admit: 2018-03-24 | Discharge: 2018-03-24 | Disposition: A | Discharge status: Home / Self Care | Payer: Medicare HMO | Source: Ambulatory Visit | Attending: Cardiothoracic Surgery | Admitting: Cardiothoracic Surgery

## 2018-03-24 DIAGNOSIS — I712 Thoracic aortic aneurysm, without rupture, unspecified: Secondary | ICD-10-CM

## 2018-03-24 MED ORDER — IOPAMIDOL (ISOVUE-370) INJECTION 76%
75.0000 mL | Freq: Once | INTRAVENOUS | Status: AC | PRN
  Administered 2018-03-24: 75 mL via INTRAVENOUS

## 2018-03-31 ENCOUNTER — Ambulatory Visit: Payer: Medicare HMO | Admitting: Cardiothoracic Surgery

## 2018-03-31 ENCOUNTER — Other Ambulatory Visit: Payer: Self-pay

## 2018-03-31 ENCOUNTER — Encounter: Payer: Self-pay | Admitting: Cardiothoracic Surgery

## 2018-03-31 VITALS — BP 130/89 | HR 82 | Resp 15 | Ht 66.0 in | Wt 147.0 lb

## 2018-03-31 DIAGNOSIS — I712 Thoracic aortic aneurysm, without rupture, unspecified: Secondary | ICD-10-CM

## 2018-03-31 NOTE — Progress Notes (Signed)
PCP is Josetta Huddle, MD Referring Provider is Josetta Huddle, MD  Chief Complaint  Patient presents with  . TAA    6 month f/u with CTA CHEST    HPI: 42-month follow-up with CTA of thoracic aorta for known asymptomatic 5.4 cm fusiform ascending aneurysm.  It was noted after an echocardiogram performed for aortic insufficiency demonstrated a dilated ascending aorta.  LV function was normal. Patient denies any upper back or chest pains. No presyncope or dizziness. No hospitalizations since evaluation 6 months ago  CTA shows stable 5.4 cm fusiform ascending aneurysm No mural thickening or ulceration or hematoma Patient has been very compliant about taking her blood pressure medication and on several blood pressure determinations in the medical record her blood pressure has been controlled.  Past Medical History:  Diagnosis Date  . Chronic low back pain   . Diverticulitis   . DJD (degenerative joint disease) of knee   . Hypercholesteremia   . Hypertension   . Neurodermatitis   . Obesity   . Osteopenia   . Recurrent UTI   . Transient global amnesia     Past Surgical History:  Procedure Laterality Date  . TOTAL KNEE ARTHROPLASTY     bilaterallly    Family History  Problem Relation Age of Onset  . Heart disease Father   . Leukemia Mother     Social History Social History   Tobacco Use  . Smoking status: Never Smoker  . Smokeless tobacco: Never Used  Substance Use Topics  . Alcohol use: No  . Drug use: No    Current Outpatient Medications  Medication Sig Dispense Refill  . amLODipine (NORVASC) 2.5 MG tablet Take 2.5 mg by mouth daily.    Marland Kitchen aspirin 81 MG tablet Take 81 mg by mouth. Three times per wk    . atenolol-chlorthalidone (TENORETIC) 50-25 MG per tablet Take 1 tablet by mouth daily.    . cholecalciferol (VITAMIN D) 1000 UNITS tablet Take 1,000 Units by mouth daily.    . citalopram (CELEXA) 20 MG tablet Take 20 mg by mouth daily.    . fesoterodine (TOVIAZ) 4  MG TB24 tablet Take 4 mg by mouth daily.    . fluticasone (FLONASE) 50 MCG/ACT nasal spray Place 2 sprays into the nose daily.    . naproxen sodium (ALEVE) 220 MG tablet Take 220 mg by mouth daily as needed.    Marland Kitchen omeprazole (PRILOSEC) 20 MG capsule Take 30- 60 min before your first and last meals of the day    . potassium chloride 20 MEQ TBCR Take 20 mEq by mouth 2 (two) times daily. 14 tablet 0  . rosuvastatin (CRESTOR) 10 MG tablet Take 5 mg by mouth 3 (three) times a week.     . traMADol (ULTRAM) 50 MG tablet Take 50 mg by mouth every 6 (six) hours as needed.     No current facility-administered medications for this visit.     Allergies  Allergen Reactions  . Codeine   . Bactrim [Sulfamethoxazole-Trimethoprim]   . Zestril [Lisinopril] Cough  . Zocor [Simvastatin]     Review of Systems   Patient recently had I-131 injection for treatment of hyperthyroidism. Weight has been stable No influenza or bronchitis this winter Lives in assisted living and remains fairly active at the [facility]  BP 130/89 (BP Location: Right Arm, Patient Position: Sitting, Cuff Size: Large)   Pulse 82   Resp 15   Ht 5\' 6"  (1.676 m)   Wt 147 lb (  66.7 kg)   SpO2 93% Comment: ON RA  BMI 23.73 kg/m  Physical Exam      Exam    General- alert and comfortable    Neck- no JVD, no cervical adenopathy palpable, no carotid bruit   Lungs- clear without rales, wheezes   Cor- regular rate and rhythm, no murmur , gallop   Abdomen- soft, non-tender   Extremities - warm, non-tender, minimal edema, prominent varicose veins bilaterally   Neuro- oriented, appropriate, no focal weakness   Diagnostic Tests: CTA images personally reviewed and counseled with patient and family. The ascending aorta remains stable with a diameter 5.4 cm.  Risk of dissection is approximately 8% At age 85 the patient's risk for surgery would be probably 3 times the risk of aortic tear.  Impression: Best option is to continue  medical therapy, blood pressure control and avoid heavy lifting. Repeat CT scan in 9 months. Plan: Return in late 2020 for  CT scan of chest without contrast   Len Childs, MD [facility] [phone]

## 2018-04-05 DIAGNOSIS — E0500 Thyrotoxicosis with diffuse goiter without thyrotoxic crisis or storm: Secondary | ICD-10-CM | POA: Diagnosis not present

## 2018-04-05 DIAGNOSIS — E0590 Thyrotoxicosis, unspecified without thyrotoxic crisis or storm: Secondary | ICD-10-CM | POA: Diagnosis not present

## 2018-06-07 DIAGNOSIS — I10 Essential (primary) hypertension: Secondary | ICD-10-CM | POA: Diagnosis not present

## 2018-06-07 DIAGNOSIS — E0590 Thyrotoxicosis, unspecified without thyrotoxic crisis or storm: Secondary | ICD-10-CM | POA: Diagnosis not present

## 2018-06-09 DIAGNOSIS — N8111 Cystocele, midline: Secondary | ICD-10-CM | POA: Diagnosis not present

## 2018-06-21 DIAGNOSIS — H109 Unspecified conjunctivitis: Secondary | ICD-10-CM | POA: Diagnosis not present

## 2018-06-23 ENCOUNTER — Emergency Department (HOSPITAL_BASED_OUTPATIENT_CLINIC_OR_DEPARTMENT_OTHER): Payer: Medicare HMO

## 2018-06-23 ENCOUNTER — Emergency Department (HOSPITAL_BASED_OUTPATIENT_CLINIC_OR_DEPARTMENT_OTHER): Admission: EM | Admit: 2018-06-23 | Discharge: 2018-06-23 | Discharge status: Absent without leave | Payer: Self-pay

## 2018-06-23 ENCOUNTER — Inpatient Hospital Stay (HOSPITAL_BASED_OUTPATIENT_CLINIC_OR_DEPARTMENT_OTHER)
Admission: EM | Admit: 2018-06-23 | Discharge: 2018-07-03 | DRG: 853 | Disposition: A | Discharge status: Home Health | Payer: Medicare HMO | Attending: Internal Medicine | Admitting: Internal Medicine

## 2018-06-23 ENCOUNTER — Encounter (HOSPITAL_BASED_OUTPATIENT_CLINIC_OR_DEPARTMENT_OTHER): Payer: Self-pay | Admitting: Emergency Medicine

## 2018-06-23 DIAGNOSIS — Z8249 Family history of ischemic heart disease and other diseases of the circulatory system: Secondary | ICD-10-CM

## 2018-06-23 DIAGNOSIS — K9189 Other postprocedural complications and disorders of digestive system: Secondary | ICD-10-CM | POA: Diagnosis not present

## 2018-06-23 DIAGNOSIS — Z79891 Long term (current) use of opiate analgesic: Secondary | ICD-10-CM

## 2018-06-23 DIAGNOSIS — K567 Ileus, unspecified: Secondary | ICD-10-CM | POA: Diagnosis not present

## 2018-06-23 DIAGNOSIS — Z1623 Resistance to quinolones and fluoroquinolones: Secondary | ICD-10-CM | POA: Diagnosis not present

## 2018-06-23 DIAGNOSIS — K56609 Unspecified intestinal obstruction, unspecified as to partial versus complete obstruction: Secondary | ICD-10-CM | POA: Diagnosis not present

## 2018-06-23 DIAGNOSIS — F419 Anxiety disorder, unspecified: Secondary | ICD-10-CM | POA: Diagnosis present

## 2018-06-23 DIAGNOSIS — Z79899 Other long term (current) drug therapy: Secondary | ICD-10-CM

## 2018-06-23 DIAGNOSIS — K3521 Acute appendicitis with generalized peritonitis, with abscess: Secondary | ICD-10-CM | POA: Diagnosis not present

## 2018-06-23 DIAGNOSIS — I10 Essential (primary) hypertension: Secondary | ICD-10-CM | POA: Diagnosis not present

## 2018-06-23 DIAGNOSIS — Z7982 Long term (current) use of aspirin: Secondary | ICD-10-CM

## 2018-06-23 DIAGNOSIS — R52 Pain, unspecified: Secondary | ICD-10-CM | POA: Diagnosis not present

## 2018-06-23 DIAGNOSIS — K3533 Acute appendicitis with perforation and localized peritonitis, with abscess: Secondary | ICD-10-CM | POA: Diagnosis not present

## 2018-06-23 DIAGNOSIS — Z20828 Contact with and (suspected) exposure to other viral communicable diseases: Secondary | ICD-10-CM | POA: Diagnosis not present

## 2018-06-23 DIAGNOSIS — E669 Obesity, unspecified: Secondary | ICD-10-CM | POA: Diagnosis present

## 2018-06-23 DIAGNOSIS — R1032 Left lower quadrant pain: Secondary | ICD-10-CM | POA: Diagnosis not present

## 2018-06-23 DIAGNOSIS — K56600 Partial intestinal obstruction, unspecified as to cause: Secondary | ICD-10-CM

## 2018-06-23 DIAGNOSIS — K430 Incisional hernia with obstruction, without gangrene: Secondary | ICD-10-CM | POA: Diagnosis not present

## 2018-06-23 DIAGNOSIS — K432 Incisional hernia without obstruction or gangrene: Secondary | ICD-10-CM | POA: Diagnosis not present

## 2018-06-23 DIAGNOSIS — N390 Urinary tract infection, site not specified: Secondary | ICD-10-CM | POA: Diagnosis present

## 2018-06-23 DIAGNOSIS — E039 Hypothyroidism, unspecified: Secondary | ICD-10-CM | POA: Diagnosis present

## 2018-06-23 DIAGNOSIS — Z87440 Personal history of urinary (tract) infections: Secondary | ICD-10-CM | POA: Diagnosis not present

## 2018-06-23 DIAGNOSIS — Z885 Allergy status to narcotic agent status: Secondary | ICD-10-CM

## 2018-06-23 DIAGNOSIS — Z888 Allergy status to other drugs, medicaments and biological substances status: Secondary | ICD-10-CM

## 2018-06-23 DIAGNOSIS — T8149XA Infection following a procedure, other surgical site, initial encounter: Secondary | ICD-10-CM

## 2018-06-23 DIAGNOSIS — F329 Major depressive disorder, single episode, unspecified: Secondary | ICD-10-CM | POA: Diagnosis present

## 2018-06-23 DIAGNOSIS — B9620 Unspecified Escherichia coli [E. coli] as the cause of diseases classified elsewhere: Secondary | ICD-10-CM | POA: Diagnosis present

## 2018-06-23 DIAGNOSIS — K651 Peritoneal abscess: Secondary | ICD-10-CM | POA: Diagnosis not present

## 2018-06-23 DIAGNOSIS — R1084 Generalized abdominal pain: Secondary | ICD-10-CM | POA: Diagnosis not present

## 2018-06-23 DIAGNOSIS — A419 Sepsis, unspecified organism: Secondary | ICD-10-CM | POA: Diagnosis not present

## 2018-06-23 DIAGNOSIS — E782 Mixed hyperlipidemia: Secondary | ICD-10-CM | POA: Diagnosis present

## 2018-06-23 DIAGNOSIS — K3580 Unspecified acute appendicitis: Secondary | ICD-10-CM

## 2018-06-23 DIAGNOSIS — E876 Hypokalemia: Secondary | ICD-10-CM

## 2018-06-23 DIAGNOSIS — T8143XA Infection following a procedure, organ and space surgical site, initial encounter: Secondary | ICD-10-CM | POA: Diagnosis not present

## 2018-06-23 DIAGNOSIS — Z7951 Long term (current) use of inhaled steroids: Secondary | ICD-10-CM

## 2018-06-23 DIAGNOSIS — K5651 Intestinal adhesions [bands], with partial obstruction: Secondary | ICD-10-CM | POA: Diagnosis present

## 2018-06-23 DIAGNOSIS — E785 Hyperlipidemia, unspecified: Secondary | ICD-10-CM | POA: Diagnosis not present

## 2018-06-23 DIAGNOSIS — R0902 Hypoxemia: Secondary | ICD-10-CM | POA: Diagnosis present

## 2018-06-23 DIAGNOSIS — M545 Low back pain: Secondary | ICD-10-CM | POA: Diagnosis present

## 2018-06-23 DIAGNOSIS — Z96653 Presence of artificial knee joint, bilateral: Secondary | ICD-10-CM | POA: Diagnosis present

## 2018-06-23 DIAGNOSIS — R110 Nausea: Secondary | ICD-10-CM | POA: Diagnosis not present

## 2018-06-23 DIAGNOSIS — Z883 Allergy status to other anti-infective agents status: Secondary | ICD-10-CM

## 2018-06-23 DIAGNOSIS — Z8719 Personal history of other diseases of the digestive system: Secondary | ICD-10-CM | POA: Diagnosis not present

## 2018-06-23 DIAGNOSIS — N739 Female pelvic inflammatory disease, unspecified: Secondary | ICD-10-CM | POA: Diagnosis not present

## 2018-06-23 DIAGNOSIS — I712 Thoracic aortic aneurysm, without rupture: Secondary | ICD-10-CM | POA: Diagnosis present

## 2018-06-23 DIAGNOSIS — K35891 Other acute appendicitis without perforation, with gangrene: Secondary | ICD-10-CM | POA: Diagnosis present

## 2018-06-23 DIAGNOSIS — R509 Fever, unspecified: Secondary | ICD-10-CM | POA: Diagnosis not present

## 2018-06-23 HISTORY — DX: Thoracic aortic aneurysm, without rupture: I71.2

## 2018-06-23 HISTORY — DX: Thoracic aortic aneurysm, without rupture, unspecified: I71.20

## 2018-06-23 HISTORY — DX: Other acute appendicitis without perforation, with gangrene: K35.891

## 2018-06-23 LAB — COMPREHENSIVE METABOLIC PANEL
ALT: 16 U/L (ref 0–44)
AST: 29 U/L (ref 15–41)
Albumin: 4 g/dL (ref 3.5–5.0)
Alkaline Phosphatase: 58 U/L (ref 38–126)
Anion gap: 13 (ref 5–15)
BUN: 29 mg/dL — ABNORMAL HIGH (ref 8–23)
CO2: 25 mmol/L (ref 22–32)
Calcium: 9.2 mg/dL (ref 8.9–10.3)
Chloride: 95 mmol/L — ABNORMAL LOW (ref 98–111)
Creatinine, Ser: 0.88 mg/dL (ref 0.44–1.00)
GFR calc Af Amer: >60 mL/min (ref >60)
GFR calc non Af Amer: 59 mL/min — ABNORMAL LOW (ref >60)
Glucose, Bld: 130 mg/dL — ABNORMAL HIGH (ref 70–99)
Potassium: 2.6 mmol/L — CL (ref 3.5–5.1)
Sodium: 133 mmol/L — ABNORMAL LOW (ref 135–145)
Total Bilirubin: 1.7 mg/dL — ABNORMAL HIGH (ref 0.3–1.2)
Total Protein: 7.6 g/dL (ref 6.5–8.1)

## 2018-06-23 LAB — URINALYSIS, MICROSCOPIC (REFLEX): WBC, UA: >50 WBC/hpf (ref 0–5)

## 2018-06-23 LAB — CBC WITH DIFFERENTIAL/PLATELET
Abs Immature Granulocytes: 0.17 10*3/uL — ABNORMAL HIGH (ref 0.00–0.07)
Basophils Absolute: 0 10*3/uL (ref 0.0–0.1)
Basophils Relative: 0 %
Eosinophils Absolute: 0 10*3/uL (ref 0.0–0.5)
Eosinophils Relative: 0 %
HCT: 45.9 % (ref 36.0–46.0)
Hemoglobin: 15.1 g/dL — ABNORMAL HIGH (ref 12.0–15.0)
Immature Granulocytes: 1 %
Lymphocytes Relative: 4 %
Lymphs Abs: 0.9 10*3/uL (ref 0.7–4.0)
MCH: 29.5 pg (ref 26.0–34.0)
MCHC: 32.9 g/dL (ref 30.0–36.0)
MCV: 89.8 fL (ref 80.0–100.0)
Monocytes Absolute: 0.8 10*3/uL (ref 0.1–1.0)
Monocytes Relative: 3 %
Neutro Abs: 22.8 10*3/uL — ABNORMAL HIGH (ref 1.7–7.7)
Neutrophils Relative %: 92 %
Platelets: 239 10*3/uL (ref 150–400)
RBC: 5.11 MIL/uL (ref 3.87–5.11)
RDW: 14.1 % (ref 11.5–15.5)
WBC: 24.8 10*3/uL — ABNORMAL HIGH (ref 4.0–10.5)
nRBC: 0 % (ref 0.0–0.2)

## 2018-06-23 LAB — LACTIC ACID, PLASMA: Lactic Acid, Venous: 1.4 mmol/L (ref 0.5–1.9)

## 2018-06-23 LAB — URINALYSIS, ROUTINE W REFLEX MICROSCOPIC
Bilirubin Urine: NEGATIVE
Glucose, UA: NEGATIVE mg/dL
Ketones, ur: NEGATIVE mg/dL
Nitrite: POSITIVE — AB
Protein, ur: NEGATIVE mg/dL
Specific Gravity, Urine: >1.03 — ABNORMAL HIGH (ref 1.005–1.030)
pH: 5.5 (ref 5.0–8.0)

## 2018-06-23 LAB — MAGNESIUM: Magnesium: 1.7 mg/dL (ref 1.7–2.4)

## 2018-06-23 LAB — SARS CORONAVIRUS 2 BY RT PCR (HOSPITAL ORDER, PERFORMED IN ~~LOC~~ HOSPITAL LAB): SARS Coronavirus 2: NEGATIVE

## 2018-06-23 MED ORDER — IOHEXOL 300 MG/ML  SOLN
100.0000 mL | Freq: Once | INTRAMUSCULAR | Status: AC | PRN
  Administered 2018-06-23: 100 mL via INTRAVENOUS

## 2018-06-23 MED ORDER — POTASSIUM CHLORIDE 10 MEQ/100ML IV SOLN
10.0000 meq | INTRAVENOUS | Status: AC
  Administered 2018-06-23 (×3): 10 meq via INTRAVENOUS
  Filled 2018-06-23 (×3): qty 100

## 2018-06-23 MED ORDER — PIPERACILLIN-TAZOBACTAM 3.375 G IVPB 30 MIN
3.3750 g | Freq: Once | INTRAVENOUS | Status: AC
  Administered 2018-06-23: 3.375 g via INTRAVENOUS
  Filled 2018-06-23 (×2): qty 50

## 2018-06-23 MED ORDER — ACETAMINOPHEN 650 MG RE SUPP
650.0000 mg | Freq: Once | RECTAL | Status: AC
  Administered 2018-06-23: 650 mg via RECTAL
  Filled 2018-06-23: qty 1

## 2018-06-23 NOTE — ED Notes (Signed)
Pt's daughters info:  (POA) Leta Jungling:  [place]: [phone]

## 2018-06-23 NOTE — ED Provider Notes (Signed)
[facility] EMERGENCY DEPARTMENT Provider Note   CSN: [id number] Arrival date & time: 06/23/18  1649    History   Chief Complaint Chief Complaint  Patient presents with  . Abdominal Pain    HPI Latoya Curry is a 85 y.o. female.     HPI   85 year old female with a history of hypertension, hyperlipidemia, diverticulitis, who presents with concern for lower abdominal pain beginning yesterday and worsening today.  Patient reports diffuse lower abdominal pain going across the whole lower abdomen with nausea.  Reports symptoms began yesterday a little bit, however were significantly worse this morning.  Reports she did have some dry heaves, but has not had any vomiting.  Developed fevers today.  Denies diarrhea, dysuria.  Reports she had an ED visit and when she had plaque pressed on the left side of her abdomen during the visit she thought the pain was worse.  She denies any cough, shortness of breath, chest pain, body aches, congestion.  She does not have any sick contacts.  Past Medical History:  Diagnosis Date  . Chronic low back pain   . Diverticulitis   . DJD (degenerative joint disease) of knee   . Hypercholesteremia   . Hypertension   . Neurodermatitis   . Obesity   . Osteopenia   . Recurrent UTI   . Transient global amnesia     Patient Active Problem List   Diagnosis Date Noted  . Acute appendicitis 06/23/2018  . Aortic root enlargement ([place]) 08/19/2017  . Ascending aorta dilation ([facility]) 08/19/2017  . Essential hypertension 08/05/2017  . Mixed dyslipidemia 08/05/2017  . PVC (premature ventricular contraction) 08/05/2017  . Chest discomfort 08/05/2017  . Cough 09/23/2011    Past Surgical History:  Procedure Laterality Date  . TOTAL KNEE ARTHROPLASTY     bilaterallly     OB History   No obstetric history on file.      Home Medications    Prior to Admission medications   Medication Sig Start Date End Date Taking? Authorizing Provider   amLODipine (NORVASC) 2.5 MG tablet Take 2.5 mg by mouth daily.    [provider]  aspirin 81 MG tablet Take 81 mg by mouth. Three times per wk    [provider]  atenolol-chlorthalidone (TENORETIC) 50-25 MG per tablet Take 1 tablet by mouth daily.    [provider]  cholecalciferol (VITAMIN D) 1000 UNITS tablet Take 1,000 Units by mouth daily.    [provider]  citalopram (CELEXA) 20 MG tablet Take 20 mg by mouth daily.    [provider]  fesoterodine (TOVIAZ) 4 MG TB24 tablet Take 4 mg by mouth daily.    [provider]  fluticasone (FLONASE) 50 MCG/ACT nasal spray Place 2 sprays into the nose daily.    [provider]  naproxen sodium (ALEVE) 220 MG tablet Take 220 mg by mouth daily as needed.    [provider]  omeprazole (PRILOSEC) 20 MG capsule Take 30- 60 min before your first and last meals of the day 09/23/11   Tanda Rockers, MD  potassium chloride 20 MEQ TBCR Take 20 mEq by mouth 2 (two) times daily. 06/12/16   Palumbo, April, MD  rosuvastatin (CRESTOR) 10 MG tablet Take 5 mg by mouth 3 (three) times a week.     [provider]  traMADol (ULTRAM) 50 MG tablet Take 50 mg by mouth every 6 (six) hours as needed.    [provider]  Family History Family History  Problem Relation Age of Onset  . Heart disease Father   . Leukemia Mother     Social History Social History   Tobacco Use  . Smoking status: Never Smoker  . Smokeless tobacco: Never Used  Substance Use Topics  . Alcohol use: No  . Drug use: No     Allergies   Codeine; Bactrim [sulfamethoxazole-trimethoprim]; Zestril [lisinopril]; and Zocor [simvastatin]   Review of Systems Review of Systems  Constitutional: Positive for appetite change, fatigue and fever.  HENT: Positive for congestion (allergies). Negative for sore throat.   Eyes: Negative for visual disturbance.  Respiratory: Negative for cough and shortness of  breath.   Cardiovascular: Negative for chest pain.  Gastrointestinal: Positive for nausea. Negative for abdominal pain, diarrhea and vomiting.  Genitourinary: Negative for difficulty urinating and dysuria.  Musculoskeletal: Negative for back pain.  Skin: Negative for rash.  Neurological: Negative for syncope and headaches.     Physical Exam Updated Vital Signs BP 100/73   Pulse 77   Temp (!) 101.3 F (38.5 C) (Oral)   Resp (!) 25   SpO2 95%   Physical Exam Vitals signs and nursing note reviewed.  Constitutional:      General: She is not in acute distress.    Appearance: She is well-developed. She is not diaphoretic.  HENT:     Head: Normocephalic and atraumatic.  Eyes:     Conjunctiva/sclera: Conjunctivae normal.  Neck:     Musculoskeletal: Normal range of motion.  Cardiovascular:     Rate and Rhythm: Normal rate and regular rhythm.     Heart sounds: Normal heart sounds. No murmur. No friction rub. No gallop.   Pulmonary:     Effort: Pulmonary effort is normal. No respiratory distress.     Breath sounds: Normal breath sounds. No wheezing or rales.  Abdominal:     General: There is no distension.     Palpations: Abdomen is soft.     Tenderness: There is abdominal tenderness in the right lower quadrant, suprapubic area and left lower quadrant. There is no guarding.  Musculoskeletal:        General: No tenderness.  Skin:    General: Skin is warm and dry.     Findings: No erythema or rash.  Neurological:     Mental Status: She is alert and oriented to person, place, and time.      ED Treatments / Results  Labs (all labs ordered are listed, but only abnormal results are displayed) Labs Reviewed  CBC WITH DIFFERENTIAL/PLATELET - Abnormal; Notable for the following components:      Result Value   WBC 24.8 (*)    Hemoglobin 15.1 (*)    Neutro Abs 22.8 (*)    Abs Immature Granulocytes 0.17 (*)    All other components within normal limits  COMPREHENSIVE METABOLIC  PANEL - Abnormal; Notable for the following components:   Sodium 133 (*)    Potassium 2.6 (*)    Chloride 95 (*)    Glucose, Bld 130 (*)    BUN 29 (*)    Total Bilirubin 1.7 (*)    GFR calc non Af Amer 59 (*)    All other components within normal limits  URINALYSIS, ROUTINE W REFLEX MICROSCOPIC - Abnormal; Notable for the following components:   APPearance CLOUDY (*)    Specific Gravity, Urine >1.030 (*)    Hgb urine dipstick SMALL (*)    Nitrite POSITIVE (*)    Leukocytes,Ua  MODERATE (*)    All other components within normal limits  URINALYSIS, MICROSCOPIC (REFLEX) - Abnormal; Notable for the following components:   Bacteria, UA MANY (*)    All other components within normal limits  SARS CORONAVIRUS 2 (HOSPITAL ORDER, [place] [facility])  CULTURE, BLOOD (ROUTINE X 2)  CULTURE, BLOOD (ROUTINE X 2)  URINE CULTURE  LACTIC ACID, PLASMA  MAGNESIUM  LACTIC ACID, PLASMA    EKG EKG Interpretation  Date/Time:  Wednesday June 23 2018 17:01:47 EDT Ventricular Rate:  90 PR Interval:    QRS Duration: 91 QT Interval:  389 QTC Calculation: 476 R Axis:   38 Text Interpretation:  Sinus rhythm Multiple premature complexes, vent & supraven Borderline prolonged PR interval Probable left atrial enlargement Anteroseptal infarct, age indeterminate No significant change since last tracing Confirmed by Gareth Morgan [phone]) on 06/23/2018 6:49:02 PM   Radiology Ct Abdomen Pelvis W Contrast  Result Date: 06/23/2018 CLINICAL DATA:  85 year old female with lower abdominal pain EXAM: CT ABDOMEN AND PELVIS WITH CONTRAST TECHNIQUE: Multidetector CT imaging of the abdomen and pelvis was performed using the standard protocol following bolus administration of intravenous contrast. CONTRAST:  182mL OMNIPAQUE IOHEXOL 300 MG/ML  SOLN COMPARISON:  None. FINDINGS: Lower chest: No acute finding of the lower chest. Atelectasis/scarring. Hepatobiliary: Unremarkable liver. Cholelithiasis. No  associated inflammatory changes. Pancreas: Punctate calcifications of pancreas with no inflammatory changes. Spleen: Unremarkable Adrenals/Urinary Tract: Unremarkable adrenal glands. No hydronephrosis or nephrolithiasis. Unremarkable course the bilateral ureters. Unremarkable urinary bladder. Stomach/Bowel: Unremarkable stomach. Small bowel decompressed without abnormal distention. No focal wall thickening of small bowel. Inflammatory changes of the appendix which is dilated, fluid-filled, with fecalith at the base. No evidence of perforation or abscess. Extensive diverticular disease without evidence of acute diverticulitis. Vascular/Lymphatic: Atherosclerosis.  No adenopathy. Reproductive: Fibroid uterus with internal calcifications, otherwise unremarkable. Pessary in place. Other: Reactive fluid within the dependent pelvis.  No hernia. Musculoskeletal: Compression fracture of T12 is unchanged from previous CT. No acute displaced fracture. Multilevel degenerative changes. IMPRESSION: Acute uncomplicated appendicitis. These results were discussed by telephone at the time of interpretation on 06/23/2018 at 8:55 pm with Dr. Gareth Morgan. Reactive free fluid within the dependent pelvis. Diverticular disease without evidence of acute diverticulitis. Cholelithiasis. Aortic Atherosclerosis (ICD10-I70.0). Ancillary findings as above. Electronically Signed   By: Corrie Mckusick D.O.   On: 06/23/2018 20:56   Dg Chest Portable 1 View  Result Date: 06/23/2018 CLINICAL DATA:  Fever. Hypoxia EXAM: PORTABLE CHEST 1 VIEW COMPARISON:  06/12/2016 FINDINGS: Stable mild cardiomegaly. Aortic atherosclerosis. Both lungs are clear. IMPRESSION: Stable mild cardiomegaly. No active lung disease. Electronically Signed   By: Earle Gell M.D.   On: 06/23/2018 18:24    Procedures .Critical Care Performed by: Gareth Morgan, MD Authorized by: Gareth Morgan, MD   Critical care provider statement:    Critical care time  (minutes):  60   Critical care was necessary to treat or prevent imminent or life-threatening deterioration of the following conditions:  Metabolic crisis and sepsis   Critical care was time spent personally by me on the following activities:  Discussions with consultants, evaluation of patient's response to treatment, examination of patient, ordering and performing treatments and interventions, ordering and review of laboratory studies, ordering and review of radiographic studies, pulse oximetry, re-evaluation of patient's condition, obtaining history from patient or surrogate and review of old charts   (including critical care time)  Medications Ordered in ED Medications  potassium chloride 10 mEq in 100 mL  IVPB (10 mEq Intravenous New Bag/Given 06/23/18 2302)  piperacillin-tazobactam (ZOSYN) IVPB 3.375 g ( Intravenous Stopped 06/23/18 1901)  acetaminophen (TYLENOL) suppository 650 mg (650 mg Rectal Given 06/23/18 1913)  iohexol (OMNIPAQUE) 300 MG/ML solution 100 mL (100 mLs Intravenous Contrast Given 06/23/18 2021)     Initial Impression / Assessment and Plan / ED Course  I have reviewed the triage vital signs and the nursing notes.  Pertinent labs & imaging results that were available during my care of the patient were reviewed by me and considered in my medical decision making (see chart for details).        85 year old female with a history of hypertension, hyperlipidemia, diverticulitis, who presents with concern for lower abdominal pain beginning yesterday and worsening today.  On arrival to the ED, noted to have tachypnea and oxygen saturation down to 86% on room air in addition to fever 101.8.  DDx includes UTI, diverticulitis, appendicitis.  She denies any respiratory concerns. CXR negative.  Given chief concern for abdominal pain and fever with finding of hypoxia, did order zosyn empirically to cover intraabdominal process with concern for sepsis and also given desire for  admission with fever/hypoxia ordered COVID19 testing early in process although have low clinical suspicion.  Labs show leukocytosis, hypokalemia, UTI.  Given K, IV fluid, zosyn.  CT abdomen pelvis shows appendicitis.  Discussed with Dr. Marcello Moores and will plan for likely surgery tomorrow AM to allow patient to be optimized for surgery overnight.  Dr. Posey Pronto to admit to hospitalist service.    Final Clinical Impressions(s) / ED Diagnoses   Final diagnoses:  Sepsis without acute organ dysfunction, due to unspecified organism [facility])  Acute appendicitis, unspecified acute appendicitis type  Urinary tract infection without hematuria, site unspecified  Hypokalemia  Hypoxia    ED Discharge Orders    None       Gareth Morgan, MD 06/23/18 2336

## 2018-06-23 NOTE — Consult Note (Signed)
Reason for Consult:appendicitis Referring Physician: Dr Thayer Headings is an 85 y.o. female.  HPI: 85 y.o. F with fevers, nausea and abd pain since this morning.  Presented to ED with SOB and hypoxia.  Pt found to have UTI and appendicitis upon work up.  WBC elevated to 24.  CT shows appendicitis.    Covid testing: neg  Past Medical History:  Diagnosis Date  . Chronic low back pain   . Diverticulitis   . DJD (degenerative joint disease) of knee   . Hypercholesteremia   . Hypertension   . Neurodermatitis   . Obesity   . Osteopenia   . Recurrent UTI   . Transient global amnesia     Past Surgical History:  Procedure Laterality Date  . TOTAL KNEE ARTHROPLASTY     bilaterallly    Family History  Problem Relation Age of Onset  . Heart disease Father   . Leukemia Mother     Social History:  reports that she has never smoked. She has never used smokeless tobacco. She reports that she does not drink alcohol or use drugs.  Allergies:  Allergies  Allergen Reactions  . Codeine   . Bactrim [Sulfamethoxazole-Trimethoprim]   . Zestril [Lisinopril] Cough  . Zocor [Simvastatin]     Medications: I have reviewed the patient's current medications.  Results for orders placed or performed during the hospital encounter of 06/23/18 (from the past 48 hour(s))  CBC with Differential     Status: Abnormal   Collection Time: 06/23/18  5:19 PM  Result Value Ref Range   WBC 24.8 (H) 4.0 - 10.5 K/uL   RBC 5.11 3.87 - 5.11 MIL/uL   Hemoglobin 15.1 (H) 12.0 - 15.0 g/dL   HCT 45.9 36.0 - 46.0 %   MCV 89.8 80.0 - 100.0 fL   MCH 29.5 26.0 - 34.0 pg   MCHC 32.9 30.0 - 36.0 g/dL   RDW 14.1 11.5 - 15.5 %   Platelets 239 150 - 400 K/uL   nRBC 0.0 0.0 - 0.2 %   Neutrophils Relative % 92 %   Neutro Abs 22.8 (H) 1.7 - 7.7 K/uL   Lymphocytes Relative 4 %   Lymphs Abs 0.9 0.7 - 4.0 K/uL   Monocytes Relative 3 %   Monocytes Absolute 0.8 0.1 - 1.0 K/uL   Eosinophils Relative 0 %   Eosinophils Absolute 0.0 0.0 - 0.5 K/uL   Basophils Relative 0 %   Basophils Absolute 0.0 0.0 - 0.1 K/uL   WBC Morphology TOXIC GRANULATION    RBC Morphology MORPHOLOGY UNREMARKABLE    Smear Review MORPHOLOGY UNREMARKABLE    Immature Granulocytes 1 %   Abs Immature Granulocytes 0.17 (H) 0.00 - 0.07 K/uL    Comment: Performed at [facility], [address] [place], [place] [address]  Comprehensive metabolic panel     Status: Abnormal   Collection Time: 06/23/18  5:19 PM  Result Value Ref Range   Sodium 133 (L) 135 - 145 mmol/L   Potassium 2.6 (LL) 3.5 - 5.1 mmol/L    Comment: CRITICAL RESULT CALLED TO, READ BACK BY AND VERIFIED WITH: MARVA SIMMS RN @1804  06/23/2018 OLSONM    Chloride 95 (L) 98 - 111 mmol/L   CO2 25 22 - 32 mmol/L   Glucose, Bld 130 (H) 70 - 99 mg/dL   BUN 29 (H) 8 - 23 mg/dL   Creatinine, Ser 0.88 0.44 - 1.00 mg/dL   Calcium 9.2 8.9 - 10.3  mg/dL   Total Protein 7.6 6.5 - 8.1 g/dL   Albumin 4.0 3.5 - 5.0 g/dL   AST 29 15 - 41 U/L   ALT 16 0 - 44 U/L   Alkaline Phosphatase 58 38 - 126 U/L   Total Bilirubin 1.7 (H) 0.3 - 1.2 mg/dL   GFR calc non Af Amer 59 (L) >60 mL/min   GFR calc Af Amer >60 >60 mL/min   Anion gap 13 5 - 15    Comment: Performed at [facility], [address] [place], [place] [address]  Lactic acid, plasma     Status: None   Collection Time: 06/23/18  5:19 PM  Result Value Ref Range   Lactic Acid, Venous 1.4 0.5 - 1.9 mmol/L    Comment: Performed at [facility], [address] [place], [place] [address]  Urinalysis, Routine w reflex microscopic     Status: Abnormal   Collection Time: 06/23/18  5:19 PM  Result Value Ref Range   Color, Urine YELLOW YELLOW   APPearance CLOUDY (A) CLEAR   Specific Gravity, Urine >1.030 (H) 1.005 - 1.030   pH 5.5 5.0 - 8.0   Glucose, UA NEGATIVE NEGATIVE mg/dL   Hgb urine dipstick SMALL (A) NEGATIVE   Bilirubin Urine NEGATIVE NEGATIVE   Ketones, ur NEGATIVE NEGATIVE  mg/dL   Protein, ur NEGATIVE NEGATIVE mg/dL   Nitrite POSITIVE (A) NEGATIVE   Leukocytes,Ua MODERATE (A) NEGATIVE    Comment: Performed at [facility], [address] [place], [place] [address]  Magnesium     Status: None   Collection Time: 06/23/18  5:19 PM  Result Value Ref Range   Magnesium 1.7 1.7 - 2.4 mg/dL    Comment: Performed at [facility], [address] [place], [place] [address]  Urinalysis, Microscopic (reflex)     Status: Abnormal   Collection Time: 06/23/18  5:19 PM  Result Value Ref Range   RBC / HPF 6-10 0 - 5 RBC/hpf   WBC, UA >50 0 - 5 WBC/hpf   Bacteria, UA MANY (A) NONE SEEN   Squamous Epithelial / LPF 0-5 0 - 5   Mucus PRESENT     Comment: Performed at [facility], [address] [place], [place] [address]    Ct Abdomen Pelvis W Contrast  Result Date: 06/23/2018 CLINICAL DATA:  85 year old female with lower abdominal pain EXAM: CT ABDOMEN AND PELVIS WITH CONTRAST TECHNIQUE: Multidetector CT imaging of the abdomen and pelvis was performed using the standard protocol following bolus administration of intravenous contrast. CONTRAST:  124mL OMNIPAQUE IOHEXOL 300 MG/ML  SOLN COMPARISON:  None. FINDINGS: Lower chest: No acute finding of the lower chest. Atelectasis/scarring. Hepatobiliary: Unremarkable liver. Cholelithiasis. No associated inflammatory changes. Pancreas: Punctate calcifications of pancreas with no inflammatory changes. Spleen: Unremarkable Adrenals/Urinary Tract: Unremarkable adrenal glands. No hydronephrosis or nephrolithiasis. Unremarkable course the bilateral ureters. Unremarkable urinary bladder. Stomach/Bowel: Unremarkable stomach. Small bowel decompressed without abnormal distention. No focal wall thickening of small bowel. Inflammatory changes of the appendix which is dilated, fluid-filled, with fecalith at the base. No evidence of perforation or abscess. Extensive diverticular disease without evidence of acute  diverticulitis. Vascular/Lymphatic: Atherosclerosis.  No adenopathy. Reproductive: Fibroid uterus with internal calcifications, otherwise unremarkable. Pessary in place. Other: Reactive fluid within the dependent pelvis.  No hernia. Musculoskeletal: Compression fracture of T12 is unchanged from previous CT. No acute displaced fracture. Multilevel degenerative changes. IMPRESSION: Acute uncomplicated appendicitis.  These results were discussed by telephone at the time of interpretation on 06/23/2018 at 8:55 pm with Dr. Gareth Morgan. Reactive free fluid within the dependent pelvis. Diverticular disease without evidence of acute diverticulitis. Cholelithiasis. Aortic Atherosclerosis (ICD10-I70.0). Ancillary findings as above. Electronically Signed   By: Corrie Mckusick D.O.   On: 06/23/2018 20:56   Dg Chest Portable 1 View  Result Date: 06/23/2018 CLINICAL DATA:  Fever. Hypoxia EXAM: PORTABLE CHEST 1 VIEW COMPARISON:  06/12/2016 FINDINGS: Stable mild cardiomegaly. Aortic atherosclerosis. Both lungs are clear. IMPRESSION: Stable mild cardiomegaly. No active lung disease. Electronically Signed   By: Earle Gell M.D.   On: 06/23/2018 18:24    Review of Systems  Constitutional: Positive for fever. Negative for chills.  HENT: Negative for hearing loss and tinnitus.   Eyes: Negative for blurred vision and double vision.  Respiratory: Negative for cough and shortness of breath.   Cardiovascular: Negative for chest pain and palpitations.  Gastrointestinal: Positive for abdominal pain and nausea. Negative for vomiting.  Genitourinary: Negative for dysuria, frequency and urgency.  Skin: Negative for itching and rash.  Neurological: Negative for dizziness and headaches.   Blood pressure 103/67, pulse 81, temperature (!) 101.3 F (38.5 C), temperature source Oral, resp. rate (!) 23, SpO2 97 %. Physical Exam  Constitutional: She is oriented to person, place, and time. She appears well-developed and  well-nourished.  HENT:  Head: Normocephalic and atraumatic.  Eyes: Pupils are equal, round, and reactive to light. Conjunctivae and EOM are normal.  Neck: Normal range of motion. Neck supple.  Cardiovascular: Normal rate and regular rhythm.  Respiratory: Effort normal. No respiratory distress.  GI: Soft. There is abdominal tenderness.  Neurological: She is alert and oriented to person, place, and time.  Skin: Skin is warm and dry.    Assessment/Plan: Rec NPO and IVF resuscitation per medical team.    IV Zosyn to cover appendicitis and UTI for now.  If stabilized, will plan on lap appendectomy today.     Rosario Adie [id number]/4967, 9:14 PM

## 2018-06-23 NOTE — ED Notes (Signed)
Pt on monitor 

## 2018-06-23 NOTE — ED Notes (Signed)
Patient transported to CT 

## 2018-06-23 NOTE — ED Triage Notes (Signed)
Lower abdominal pain with nausea and fever since yesterday.

## 2018-06-23 NOTE — H&P (View-Only) (Signed)
Reason for Consult:appendicitis Referring Physician: Dr Thayer Headings is an 85 y.o. female.  HPI: 85 y.o. F with fevers, nausea and abd pain since this morning.  Presented to ED with SOB and hypoxia.  Pt found to have UTI and appendicitis upon work up.  WBC elevated to 24.  CT shows appendicitis.    Covid testing: neg  Past Medical History:  Diagnosis Date  . Chronic low back pain   . Diverticulitis   . DJD (degenerative joint disease) of knee   . Hypercholesteremia   . Hypertension   . Neurodermatitis   . Obesity   . Osteopenia   . Recurrent UTI   . Transient global amnesia     Past Surgical History:  Procedure Laterality Date  . TOTAL KNEE ARTHROPLASTY     bilaterallly    Family History  Problem Relation Age of Onset  . Heart disease Father   . Leukemia Mother     Social History:  reports that she has never smoked. She has never used smokeless tobacco. She reports that she does not drink alcohol or use drugs.  Allergies:  Allergies  Allergen Reactions  . Codeine   . Bactrim [Sulfamethoxazole-Trimethoprim]   . Zestril [Lisinopril] Cough  . Zocor [Simvastatin]     Medications: I have reviewed the patient's current medications.  Results for orders placed or performed during the hospital encounter of 06/23/18 (from the past 48 hour(s))  CBC with Differential     Status: Abnormal   Collection Time: 06/23/18  5:19 PM  Result Value Ref Range   WBC 24.8 (H) 4.0 - 10.5 K/uL   RBC 5.11 3.87 - 5.11 MIL/uL   Hemoglobin 15.1 (H) 12.0 - 15.0 g/dL   HCT 45.9 36.0 - 46.0 %   MCV 89.8 80.0 - 100.0 fL   MCH 29.5 26.0 - 34.0 pg   MCHC 32.9 30.0 - 36.0 g/dL   RDW 14.1 11.5 - 15.5 %   Platelets 239 150 - 400 K/uL   nRBC 0.0 0.0 - 0.2 %   Neutrophils Relative % 92 %   Neutro Abs 22.8 (H) 1.7 - 7.7 K/uL   Lymphocytes Relative 4 %   Lymphs Abs 0.9 0.7 - 4.0 K/uL   Monocytes Relative 3 %   Monocytes Absolute 0.8 0.1 - 1.0 K/uL   Eosinophils Relative 0 %   Eosinophils Absolute 0.0 0.0 - 0.5 K/uL   Basophils Relative 0 %   Basophils Absolute 0.0 0.0 - 0.1 K/uL   WBC Morphology TOXIC GRANULATION    RBC Morphology MORPHOLOGY UNREMARKABLE    Smear Review MORPHOLOGY UNREMARKABLE    Immature Granulocytes 1 %   Abs Immature Granulocytes 0.17 (H) 0.00 - 0.07 K/uL    Comment: Performed at [facility], [address] [place], [place] [address]  Comprehensive metabolic panel     Status: Abnormal   Collection Time: 06/23/18  5:19 PM  Result Value Ref Range   Sodium 133 (L) 135 - 145 mmol/L   Potassium 2.6 (LL) 3.5 - 5.1 mmol/L    Comment: CRITICAL RESULT CALLED TO, READ BACK BY AND VERIFIED WITH: MARVA SIMMS RN @1804  06/23/2018 OLSONM    Chloride 95 (L) 98 - 111 mmol/L   CO2 25 22 - 32 mmol/L   Glucose, Bld 130 (H) 70 - 99 mg/dL   BUN 29 (H) 8 - 23 mg/dL   Creatinine, Ser 0.88 0.44 - 1.00 mg/dL   Calcium 9.2 8.9 - 10.3  mg/dL   Total Protein 7.6 6.5 - 8.1 g/dL   Albumin 4.0 3.5 - 5.0 g/dL   AST 29 15 - 41 U/L   ALT 16 0 - 44 U/L   Alkaline Phosphatase 58 38 - 126 U/L   Total Bilirubin 1.7 (H) 0.3 - 1.2 mg/dL   GFR calc non Af Amer 59 (L) >60 mL/min   GFR calc Af Amer >60 >60 mL/min   Anion gap 13 5 - 15    Comment: Performed at [facility], [address] [place], [place] [address]  Lactic acid, plasma     Status: None   Collection Time: 06/23/18  5:19 PM  Result Value Ref Range   Lactic Acid, Venous 1.4 0.5 - 1.9 mmol/L    Comment: Performed at [facility], [address] [place], [place] [address]  Urinalysis, Routine w reflex microscopic     Status: Abnormal   Collection Time: 06/23/18  5:19 PM  Result Value Ref Range   Color, Urine YELLOW YELLOW   APPearance CLOUDY (A) CLEAR   Specific Gravity, Urine >1.030 (H) 1.005 - 1.030   pH 5.5 5.0 - 8.0   Glucose, UA NEGATIVE NEGATIVE mg/dL   Hgb urine dipstick SMALL (A) NEGATIVE   Bilirubin Urine NEGATIVE NEGATIVE   Ketones, ur NEGATIVE NEGATIVE  mg/dL   Protein, ur NEGATIVE NEGATIVE mg/dL   Nitrite POSITIVE (A) NEGATIVE   Leukocytes,Ua MODERATE (A) NEGATIVE    Comment: Performed at [facility], [address] [place], [place] [address]  Magnesium     Status: None   Collection Time: 06/23/18  5:19 PM  Result Value Ref Range   Magnesium 1.7 1.7 - 2.4 mg/dL    Comment: Performed at [facility], [address] [place], [place] [address]  Urinalysis, Microscopic (reflex)     Status: Abnormal   Collection Time: 06/23/18  5:19 PM  Result Value Ref Range   RBC / HPF 6-10 0 - 5 RBC/hpf   WBC, UA >50 0 - 5 WBC/hpf   Bacteria, UA MANY (A) NONE SEEN   Squamous Epithelial / LPF 0-5 0 - 5   Mucus PRESENT     Comment: Performed at [facility], [address] [place], [place] [address]    Ct Abdomen Pelvis W Contrast  Result Date: 06/23/2018 CLINICAL DATA:  85 year old female with lower abdominal pain EXAM: CT ABDOMEN AND PELVIS WITH CONTRAST TECHNIQUE: Multidetector CT imaging of the abdomen and pelvis was performed using the standard protocol following bolus administration of intravenous contrast. CONTRAST:  1109mL OMNIPAQUE IOHEXOL 300 MG/ML  SOLN COMPARISON:  None. FINDINGS: Lower chest: No acute finding of the lower chest. Atelectasis/scarring. Hepatobiliary: Unremarkable liver. Cholelithiasis. No associated inflammatory changes. Pancreas: Punctate calcifications of pancreas with no inflammatory changes. Spleen: Unremarkable Adrenals/Urinary Tract: Unremarkable adrenal glands. No hydronephrosis or nephrolithiasis. Unremarkable course the bilateral ureters. Unremarkable urinary bladder. Stomach/Bowel: Unremarkable stomach. Small bowel decompressed without abnormal distention. No focal wall thickening of small bowel. Inflammatory changes of the appendix which is dilated, fluid-filled, with fecalith at the base. No evidence of perforation or abscess. Extensive diverticular disease without evidence of acute  diverticulitis. Vascular/Lymphatic: Atherosclerosis.  No adenopathy. Reproductive: Fibroid uterus with internal calcifications, otherwise unremarkable. Pessary in place. Other: Reactive fluid within the dependent pelvis.  No hernia. Musculoskeletal: Compression fracture of T12 is unchanged from previous CT. No acute displaced fracture. Multilevel degenerative changes. IMPRESSION: Acute uncomplicated appendicitis.  These results were discussed by telephone at the time of interpretation on 06/23/2018 at 8:55 pm with Dr. Gareth Morgan. Reactive free fluid within the dependent pelvis. Diverticular disease without evidence of acute diverticulitis. Cholelithiasis. Aortic Atherosclerosis (ICD10-I70.0). Ancillary findings as above. Electronically Signed   By: Corrie Mckusick D.O.   On: 06/23/2018 20:56   Dg Chest Portable 1 View  Result Date: 06/23/2018 CLINICAL DATA:  Fever. Hypoxia EXAM: PORTABLE CHEST 1 VIEW COMPARISON:  06/12/2016 FINDINGS: Stable mild cardiomegaly. Aortic atherosclerosis. Both lungs are clear. IMPRESSION: Stable mild cardiomegaly. No active lung disease. Electronically Signed   By: Earle Gell M.D.   On: 06/23/2018 18:24    Review of Systems  Constitutional: Positive for fever. Negative for chills.  HENT: Negative for hearing loss and tinnitus.   Eyes: Negative for blurred vision and double vision.  Respiratory: Negative for cough and shortness of breath.   Cardiovascular: Negative for chest pain and palpitations.  Gastrointestinal: Positive for abdominal pain and nausea. Negative for vomiting.  Genitourinary: Negative for dysuria, frequency and urgency.  Skin: Negative for itching and rash.  Neurological: Negative for dizziness and headaches.   Blood pressure 103/67, pulse 81, temperature (!) 101.3 F (38.5 C), temperature source Oral, resp. rate (!) 23, SpO2 97 %. Physical Exam  Constitutional: She is oriented to person, place, and time. She appears well-developed and  well-nourished.  HENT:  Head: Normocephalic and atraumatic.  Eyes: Pupils are equal, round, and reactive to light. Conjunctivae and EOM are normal.  Neck: Normal range of motion. Neck supple.  Cardiovascular: Normal rate and regular rhythm.  Respiratory: Effort normal. No respiratory distress.  GI: Soft. There is abdominal tenderness.  Neurological: She is alert and oriented to person, place, and time.  Skin: Skin is warm and dry.    Assessment/Plan: Rec NPO and IVF resuscitation per medical team.    IV Zosyn to cover appendicitis and UTI for now.  If stabilized, will plan on lap appendectomy today.     Rosario Adie 08/06/2447, 9:14 PM

## 2018-06-24 ENCOUNTER — Encounter (HOSPITAL_COMMUNITY): Payer: Self-pay | Admitting: *Deleted

## 2018-06-24 ENCOUNTER — Inpatient Hospital Stay (HOSPITAL_COMMUNITY): Payer: Medicare HMO | Admitting: Anesthesiology

## 2018-06-24 ENCOUNTER — Other Ambulatory Visit: Payer: Self-pay

## 2018-06-24 ENCOUNTER — Encounter (HOSPITAL_COMMUNITY)
Admission: EM | Disposition: A | Discharge status: Home Health | Payer: Self-pay | Source: Home / Self Care | Attending: Internal Medicine

## 2018-06-24 DIAGNOSIS — E876 Hypokalemia: Secondary | ICD-10-CM

## 2018-06-24 DIAGNOSIS — K430 Incisional hernia with obstruction, without gangrene: Secondary | ICD-10-CM

## 2018-06-24 DIAGNOSIS — K56600 Partial intestinal obstruction, unspecified as to cause: Secondary | ICD-10-CM

## 2018-06-24 DIAGNOSIS — K3580 Unspecified acute appendicitis: Secondary | ICD-10-CM

## 2018-06-24 HISTORY — PX: LAPAROSCOPIC APPENDECTOMY: SHX408

## 2018-06-24 HISTORY — DX: Incisional hernia with obstruction, without gangrene: K43.0

## 2018-06-24 HISTORY — DX: Hypokalemia: E87.6

## 2018-06-24 HISTORY — DX: Partial intestinal obstruction, unspecified as to cause: K56.600

## 2018-06-24 LAB — CBC
HCT: 45.6 % (ref 36.0–46.0)
Hemoglobin: 14.5 g/dL (ref 12.0–15.0)
MCH: 30 pg (ref 26.0–34.0)
MCHC: 31.8 g/dL (ref 30.0–36.0)
MCV: 94.4 fL (ref 80.0–100.0)
Platelets: 230 10*3/uL (ref 150–400)
RBC: 4.83 MIL/uL (ref 3.87–5.11)
RDW: 14.4 % (ref 11.5–15.5)
WBC: 20.5 10*3/uL — ABNORMAL HIGH (ref 4.0–10.5)
nRBC: 0 % (ref 0.0–0.2)

## 2018-06-24 LAB — BASIC METABOLIC PANEL
Anion gap: 11 (ref 5–15)
BUN: 32 mg/dL — ABNORMAL HIGH (ref 8–23)
CO2: 22 mmol/L (ref 22–32)
Calcium: 9 mg/dL (ref 8.9–10.3)
Chloride: 103 mmol/L (ref 98–111)
Creatinine, Ser: 1.01 mg/dL — ABNORMAL HIGH (ref 0.44–1.00)
GFR calc Af Amer: 58 mL/min — ABNORMAL LOW (ref >60)
GFR calc non Af Amer: 50 mL/min — ABNORMAL LOW (ref >60)
Glucose, Bld: 101 mg/dL — ABNORMAL HIGH (ref 70–99)
Potassium: 3.6 mmol/L (ref 3.5–5.1)
Sodium: 136 mmol/L (ref 135–145)

## 2018-06-24 LAB — MAGNESIUM: Magnesium: 2.3 mg/dL (ref 1.7–2.4)

## 2018-06-24 LAB — MRSA PCR SCREENING: MRSA by PCR: NEGATIVE

## 2018-06-24 SURGERY — APPENDECTOMY, LAPAROSCOPIC
Anesthesia: General | Site: Abdomen

## 2018-06-24 MED ORDER — ONDANSETRON HCL 4 MG/2ML IJ SOLN: 4.0000 mg | Freq: Four times a day (QID) | INTRAMUSCULAR | Status: DC | PRN

## 2018-06-24 MED ORDER — ATENOLOL 50 MG PO TABS
50.0000 mg | ORAL_TABLET | Freq: Every day | ORAL | Status: DC
  Filled 2018-06-24: qty 1

## 2018-06-24 MED ORDER — ONDANSETRON HCL 4 MG/2ML IJ SOLN
INTRAMUSCULAR | Status: AC
  Filled 2018-06-24: qty 2

## 2018-06-24 MED ORDER — LACTATED RINGERS IV SOLN
INTRAVENOUS | Status: DC
  Administered 2018-06-24 – 2018-07-03 (×8): via INTRAVENOUS

## 2018-06-24 MED ORDER — CHLORHEXIDINE GLUCONATE CLOTH 2 % EX PADS: 6.0000 | MEDICATED_PAD | Freq: Once | CUTANEOUS | Status: DC

## 2018-06-24 MED ORDER — SODIUM CHLORIDE 0.9 % IV SOLN: 8.0000 mg | Freq: Four times a day (QID) | INTRAVENOUS | Status: DC | PRN

## 2018-06-24 MED ORDER — SODIUM CHLORIDE 0.9 % IV SOLN
2.0000 g | INTRAVENOUS | Status: AC
  Administered 2018-06-24: 2 g via INTRAVENOUS
  Filled 2018-06-24: qty 2

## 2018-06-24 MED ORDER — SUCCINYLCHOLINE CHLORIDE 200 MG/10ML IV SOSY
PREFILLED_SYRINGE | INTRAVENOUS | Status: DC | PRN
  Administered 2018-06-24: 100 mg via INTRAVENOUS

## 2018-06-24 MED ORDER — PROPOFOL 10 MG/ML IV BOLUS
INTRAVENOUS | Status: AC
  Filled 2018-06-24: qty 20

## 2018-06-24 MED ORDER — FENTANYL CITRATE (PF) 100 MCG/2ML IJ SOLN: 25.0000 ug | INTRAMUSCULAR | Status: DC | PRN

## 2018-06-24 MED ORDER — ATENOLOL-CHLORTHALIDONE 50-25 MG PO TABS: 1.0000 | ORAL_TABLET | Freq: Every day | ORAL | Status: DC

## 2018-06-24 MED ORDER — METHOCARBAMOL 1000 MG/10ML IJ SOLN
1000.0000 mg | Freq: Four times a day (QID) | INTRAVENOUS | Status: DC | PRN
  Filled 2018-06-24: qty 10

## 2018-06-24 MED ORDER — POTASSIUM CHLORIDE IN NACL 20-0.9 MEQ/L-% IV SOLN
INTRAVENOUS | Status: AC
  Administered 2018-06-24: 02:00:00 via INTRAVENOUS
  Filled 2018-06-24 (×2): qty 1000

## 2018-06-24 MED ORDER — PROMETHAZINE HCL 25 MG/ML IJ SOLN: 6.2500 mg | INTRAMUSCULAR | Status: DC | PRN

## 2018-06-24 MED ORDER — GABAPENTIN 300 MG PO CAPS
300.0000 mg | ORAL_CAPSULE | ORAL | Status: AC
  Administered 2018-06-24: 300 mg via ORAL
  Filled 2018-06-24: qty 1

## 2018-06-24 MED ORDER — ESMOLOL HCL 100 MG/10ML IV SOLN
INTRAVENOUS | Status: DC | PRN
  Administered 2018-06-24: 20 mg via INTRAVENOUS

## 2018-06-24 MED ORDER — ROCURONIUM BROMIDE 10 MG/ML (PF) SYRINGE
PREFILLED_SYRINGE | INTRAVENOUS | Status: DC | PRN
  Administered 2018-06-24: 40 mg via INTRAVENOUS

## 2018-06-24 MED ORDER — MAGNESIUM SULFATE 2 GM/50ML IV SOLN
2.0000 g | Freq: Once | INTRAVENOUS | Status: AC
  Administered 2018-06-24: 2 g via INTRAVENOUS
  Filled 2018-06-24: qty 50

## 2018-06-24 MED ORDER — CITALOPRAM HYDROBROMIDE 20 MG PO TABS
20.0000 mg | ORAL_TABLET | Freq: Every day | ORAL | Status: DC
  Filled 2018-06-24: qty 1

## 2018-06-24 MED ORDER — BISACODYL 10 MG RE SUPP
10.0000 mg | Freq: Two times a day (BID) | RECTAL | Status: DC | PRN
  Administered 2018-06-26: 18:00:00 10 mg via RECTAL
  Filled 2018-06-24: qty 1

## 2018-06-24 MED ORDER — SUGAMMADEX SODIUM 200 MG/2ML IV SOLN
INTRAVENOUS | Status: DC | PRN
  Administered 2018-06-24: 200 mg via INTRAVENOUS

## 2018-06-24 MED ORDER — FENTANYL CITRATE (PF) 100 MCG/2ML IJ SOLN
INTRAMUSCULAR | Status: AC
  Filled 2018-06-24: qty 2

## 2018-06-24 MED ORDER — LIDOCAINE 2% (20 MG/ML) 5 ML SYRINGE
INTRAMUSCULAR | Status: AC
  Filled 2018-06-24: qty 10

## 2018-06-24 MED ORDER — ACETAMINOPHEN 325 MG PO TABS
650.0000 mg | ORAL_TABLET | Freq: Four times a day (QID) | ORAL | Status: DC | PRN
  Administered 2018-06-24: 650 mg via ORAL
  Filled 2018-06-24 (×2): qty 2

## 2018-06-24 MED ORDER — SODIUM CHLORIDE 0.9 % IV SOLN
8.0000 mg | Freq: Four times a day (QID) | INTRAVENOUS | Status: DC | PRN
  Filled 2018-06-24: qty 4

## 2018-06-24 MED ORDER — SUCCINYLCHOLINE CHLORIDE 200 MG/10ML IV SOSY
PREFILLED_SYRINGE | INTRAVENOUS | Status: AC
  Filled 2018-06-24: qty 10

## 2018-06-24 MED ORDER — PHENYLEPHRINE 40 MCG/ML (10ML) SYRINGE FOR IV PUSH (FOR BLOOD PRESSURE SUPPORT)
PREFILLED_SYRINGE | INTRAVENOUS | Status: DC | PRN
  Administered 2018-06-24: 40 ug via INTRAVENOUS
  Administered 2018-06-24 (×3): 120 ug via INTRAVENOUS

## 2018-06-24 MED ORDER — PHENYLEPHRINE HCL (PRESSORS) 10 MG/ML IV SOLN
INTRAVENOUS | Status: AC
  Filled 2018-06-24: qty 1

## 2018-06-24 MED ORDER — ACETAMINOPHEN 500 MG PO TABS
1000.0000 mg | ORAL_TABLET | ORAL | Status: AC
  Administered 2018-06-24: 1000 mg via ORAL
  Filled 2018-06-24: qty 2

## 2018-06-24 MED ORDER — ONDANSETRON HCL 4 MG PO TABS: 4.0000 mg | ORAL_TABLET | Freq: Four times a day (QID) | ORAL | Status: DC | PRN

## 2018-06-24 MED ORDER — PROCHLORPERAZINE EDISYLATE 10 MG/2ML IJ SOLN: 5.0000 mg | INTRAMUSCULAR | Status: DC | PRN

## 2018-06-24 MED ORDER — PROPOFOL 10 MG/ML IV BOLUS
INTRAVENOUS | Status: DC | PRN
  Administered 2018-06-24: 120 mg via INTRAVENOUS

## 2018-06-24 MED ORDER — AMLODIPINE BESYLATE 5 MG PO TABS
2.5000 mg | ORAL_TABLET | Freq: Every day | ORAL | Status: DC
  Filled 2018-06-24: qty 1

## 2018-06-24 MED ORDER — CHLORTHALIDONE 25 MG PO TABS
25.0000 mg | ORAL_TABLET | Freq: Every day | ORAL | Status: DC
  Filled 2018-06-24 (×4): qty 1

## 2018-06-24 MED ORDER — SODIUM CHLORIDE 0.9 % IV SOLN
INTRAVENOUS | Status: DC | PRN
  Administered 2018-06-24: 100 ug/min via INTRAVENOUS

## 2018-06-24 MED ORDER — 0.9 % SODIUM CHLORIDE (POUR BTL) OPTIME
TOPICAL | Status: DC | PRN
  Administered 2018-06-24: 1000 mL

## 2018-06-24 MED ORDER — BUPIVACAINE-EPINEPHRINE 0.25% -1:200000 IJ SOLN
INTRAMUSCULAR | Status: DC | PRN
  Administered 2018-06-24: 30 mL

## 2018-06-24 MED ORDER — ROSUVASTATIN CALCIUM 5 MG PO TABS
5.0000 mg | ORAL_TABLET | ORAL | Status: DC
  Administered 2018-06-28 – 2018-07-02 (×3): 5 mg via ORAL
  Filled 2018-06-24 (×4): qty 1

## 2018-06-24 MED ORDER — SUGAMMADEX SODIUM 200 MG/2ML IV SOLN
INTRAVENOUS | Status: AC
  Filled 2018-06-24: qty 2

## 2018-06-24 MED ORDER — FENTANYL CITRATE (PF) 100 MCG/2ML IJ SOLN
INTRAMUSCULAR | Status: DC | PRN
  Administered 2018-06-24: 25 ug via INTRAVENOUS
  Administered 2018-06-24: 50 ug via INTRAVENOUS

## 2018-06-24 MED ORDER — FENTANYL CITRATE (PF) 100 MCG/2ML IJ SOLN
25.0000 ug | INTRAMUSCULAR | Status: DC | PRN
  Administered 2018-06-24: 25 ug via INTRAVENOUS

## 2018-06-24 MED ORDER — ONDANSETRON HCL 4 MG/2ML IJ SOLN
INTRAMUSCULAR | Status: DC | PRN
  Administered 2018-06-24: 4 mg via INTRAVENOUS

## 2018-06-24 MED ORDER — PIPERACILLIN-TAZOBACTAM 3.375 G IVPB
3.3750 g | Freq: Three times a day (TID) | INTRAVENOUS | Status: DC
  Administered 2018-06-24 (×2): 3.375 g via INTRAVENOUS
  Filled 2018-06-24 (×2): qty 50

## 2018-06-24 MED ORDER — POTASSIUM CHLORIDE 10 MEQ/100ML IV SOLN
10.0000 meq | INTRAVENOUS | Status: AC
  Administered 2018-06-24 (×3): 10 meq via INTRAVENOUS
  Filled 2018-06-24 (×3): qty 100

## 2018-06-24 MED ORDER — LIDOCAINE 2% (20 MG/ML) 5 ML SYRINGE
INTRAMUSCULAR | Status: DC | PRN
  Administered 2018-06-24: 60 mg via INTRAVENOUS

## 2018-06-24 MED ORDER — ACETAMINOPHEN 650 MG RE SUPP: 650.0000 mg | Freq: Four times a day (QID) | RECTAL | Status: DC | PRN

## 2018-06-24 MED ORDER — ROCURONIUM BROMIDE 10 MG/ML (PF) SYRINGE
PREFILLED_SYRINGE | INTRAVENOUS | Status: AC
  Filled 2018-06-24: qty 10

## 2018-06-24 MED ORDER — PIPERACILLIN-TAZOBACTAM 3.375 G IVPB
3.3750 g | Freq: Three times a day (TID) | INTRAVENOUS | Status: DC
  Administered 2018-06-24 – 2018-06-26 (×5): 3.375 g via INTRAVENOUS
  Filled 2018-06-24 (×5): qty 50

## 2018-06-24 MED ORDER — STERILE WATER FOR IRRIGATION IR SOLN
Status: DC | PRN
  Administered 2018-06-24: 1000 mL

## 2018-06-24 MED ORDER — MAGIC MOUTHWASH
15.0000 mL | Freq: Four times a day (QID) | ORAL | Status: DC | PRN
  Filled 2018-06-24: qty 15

## 2018-06-24 MED ORDER — PHENYLEPHRINE 40 MCG/ML (10ML) SYRINGE FOR IV PUSH (FOR BLOOD PRESSURE SUPPORT)
PREFILLED_SYRINGE | INTRAVENOUS | Status: AC
  Filled 2018-06-24: qty 10

## 2018-06-24 MED ORDER — LACTATED RINGERS IR SOLN
Status: DC | PRN
  Administered 2018-06-24: 7000 mL

## 2018-06-24 MED ORDER — ALBUMIN HUMAN 5 % IV SOLN
12.5000 g | Freq: Four times a day (QID) | INTRAVENOUS | Status: AC | PRN
  Filled 2018-06-24: qty 250

## 2018-06-24 MED ORDER — LIP MEDEX EX OINT
1.0000 "application " | TOPICAL_OINTMENT | Freq: Two times a day (BID) | CUTANEOUS | Status: DC
  Administered 2018-06-24 – 2018-07-03 (×14): 1 via TOPICAL
  Filled 2018-06-24 (×3): qty 7

## 2018-06-24 SURGICAL SUPPLY — 60 items
APL PRP STRL LF DISP 70% ISPRP (MISCELLANEOUS) ×1
APPLIER CLIP 5 13 M/L LIGAMAX5 (MISCELLANEOUS)
APPLIER CLIP ROT 10 11.4 M/L (STAPLE)
APR CLP MED LRG 11.4X10 (STAPLE)
APR CLP MED LRG 5 ANG JAW (MISCELLANEOUS)
BAG SPEC RTRVL 10 TROC 200 (ENDOMECHANICALS) ×1
BAG SPEC RTRVL LRG 6X4 10 (ENDOMECHANICALS) ×1
CABLE HIGH FREQUENCY MONO STRZ (ELECTRODE) ×2 IMPLANT
CHLORAPREP W/TINT 26 (MISCELLANEOUS) ×2 IMPLANT
CLIP APPLIE 5 13 M/L LIGAMAX5 (MISCELLANEOUS) IMPLANT
CLIP APPLIE ROT 10 11.4 M/L (STAPLE) IMPLANT
COVER SURGICAL LIGHT HANDLE (MISCELLANEOUS) ×2 IMPLANT
COVER WAND RF STERILE (DRAPES) IMPLANT
CUTTER FLEX LINEAR 45M (STAPLE) ×1 IMPLANT
DECANTER SPIKE VIAL GLASS SM (MISCELLANEOUS) ×2 IMPLANT
DEVICE TROCAR PUNCTURE CLOSURE (ENDOMECHANICALS) ×1 IMPLANT
DRAIN CHANNEL 19F RND (DRAIN) ×1 IMPLANT
DRAPE LAPAROSCOPIC ABDOMINAL (DRAPES) ×2 IMPLANT
DRAPE WARM FLUID 44X44 (DRAPE) ×2 IMPLANT
DRSG TEGADERM 2-3/8X2-3/4 SM (GAUZE/BANDAGES/DRESSINGS) ×3 IMPLANT
DRSG TEGADERM 4X4.75 (GAUZE/BANDAGES/DRESSINGS) ×2 IMPLANT
ELECT REM PT RETURN 15FT ADLT (MISCELLANEOUS) ×2 IMPLANT
ENDOLOOP SUT PDS II  0 18 (SUTURE) ×1
ENDOLOOP SUT PDS II 0 18 (SUTURE) IMPLANT
EVACUATOR SILICONE 100CC (DRAIN) ×1 IMPLANT
GAUZE SPONGE 2X2 8PLY STRL LF (GAUZE/BANDAGES/DRESSINGS) ×1 IMPLANT
GLOVE ECLIPSE 8.0 STRL XLNG CF (GLOVE) ×2 IMPLANT
GLOVE INDICATOR 8.0 STRL GRN (GLOVE) ×2 IMPLANT
GOWN STRL REUS W/TWL XL LVL3 (GOWN DISPOSABLE) ×4 IMPLANT
IRRIG SUCT STRYKERFLOW 2 WTIP (MISCELLANEOUS) ×2
IRRIGATION SUCT STRKRFLW 2 WTP (MISCELLANEOUS) ×1 IMPLANT
KIT BASIN OR (CUSTOM PROCEDURE TRAY) ×2 IMPLANT
KIT TURNOVER KIT A (KITS) IMPLANT
PAD POSITIONING PINK XL (MISCELLANEOUS) ×2 IMPLANT
POUCH RETRIEVAL ECOSAC 10 (ENDOMECHANICALS) IMPLANT
POUCH RETRIEVAL ECOSAC 10MM (ENDOMECHANICALS) ×1
POUCH SPECIMEN RETRIEVAL 10MM (ENDOMECHANICALS) ×2 IMPLANT
RELOAD 45 VASCULAR/THIN (ENDOMECHANICALS) IMPLANT
RELOAD STAPLE 45 2.5 WHT GRN (ENDOMECHANICALS) IMPLANT
RELOAD STAPLE 45 3.5 BLU ETS (ENDOMECHANICALS) IMPLANT
RELOAD STAPLE 60 3.6 BLU REG (STAPLE) IMPLANT
RELOAD STAPLE TA45 3.5 REG BLU (ENDOMECHANICALS) IMPLANT
RELOAD STAPLER BLUE 60MM (STAPLE) ×1 IMPLANT
SCISSORS LAP 5X35 DISP (ENDOMECHANICALS) ×2 IMPLANT
SET TUBE SMOKE EVAC HIGH FLOW (TUBING) ×2 IMPLANT
SHEARS HARMONIC ACE PLUS 36CM (ENDOMECHANICALS) ×1 IMPLANT
SLEEVE XCEL OPT CAN 5 100 (ENDOMECHANICALS) ×2 IMPLANT
SPONGE GAUZE 2X2 STER 10/PKG (GAUZE/BANDAGES/DRESSINGS) ×1
STAPLE ECHEON FLEX 60 POW ENDO (STAPLE) ×1 IMPLANT
STAPLER RELOAD BLUE 60MM (STAPLE) ×2
SUT MNCRL AB 4-0 PS2 18 (SUTURE) ×2 IMPLANT
SUT PDS AB 0 CT1 36 (SUTURE) IMPLANT
SUT PDS AB 1 CT1 27 (SUTURE) ×1 IMPLANT
SUT PROLENE 2 0 CT2 30 (SUTURE) ×1 IMPLANT
SUT SILK 2 0 SH (SUTURE) IMPLANT
TOWEL OR 17X26 10 PK STRL BLUE (TOWEL DISPOSABLE) ×2 IMPLANT
TRAY FOLEY MTR SLVR 14FR STAT (SET/KITS/TRAYS/PACK) ×1 IMPLANT
TRAY LAPAROSCOPIC (CUSTOM PROCEDURE TRAY) ×2 IMPLANT
TROCAR BLADELESS OPT 5 100 (ENDOMECHANICALS) ×2 IMPLANT
TROCAR XCEL 12X100 BLDLESS (ENDOMECHANICALS) ×2 IMPLANT

## 2018-06-24 NOTE — Anesthesia Procedure Notes (Signed)
Procedure Name: Intubation Date/Time: 06/24/2018 12:32 PM Performed by: [place] Maleeah Crossman C, CRNA Pre-anesthesia Checklist: Patient identified, Emergency Drugs available, Suction available and Patient being monitored Patient Re-evaluated:Patient Re-evaluated prior to induction Oxygen Delivery Method: Circle system utilized Preoxygenation: Pre-oxygenation with 100% oxygen Induction Type: IV induction and Rapid sequence Laryngoscope Size: Mac and 3 Grade View: Grade I Tube type: Oral Tube size: 7.0 mm Number of attempts: 1 Airway Equipment and Method: Stylet and Oral airway Placement Confirmation: ETT inserted through vocal cords under direct vision,  positive ETCO2 and breath sounds checked- equal and bilateral Secured at: 20 cm Tube secured with: Tape Dental Injury: Teeth and Oropharynx as per pre-operative assessment

## 2018-06-24 NOTE — Op Note (Signed)
06/24/2018  PATIENT:  Latoya Curry  85 y.o. female  Patient Care Team: Josetta Huddle, MD as PCP - General (Internal Medicine) Josetta Huddle, MD as Attending Physician (Internal Medicine)  PRE-OPERATIVE DIAGNOSIS:  Acute appendicitis  POST-OPERATIVE DIAGNOSIS:   Acute gangrenous appendicitis Pelvic abscess Partial small bowel obstruction due to adhesions Small supraumbilical incarcerated incisional hernia  PROCEDURE:   Laparoscopic lysis of adhesions Laparoscopic appendectomy Primary incisional hernia repair  SURGEON:  Adin Hector, MD  ANESTHESIA:   local and general  EBL:  Total I/O In: 0  Out: 650 [Urine:650]  Delay start of Pharmacological VTE agent (>24hrs) due to surgical blood loss or risk of bleeding:  no  DRAINS: 19 French drain in the left lower quadrant skin goes down into the pelvis and right lower quadrant gutter around staple line  SPECIMEN:  APPENDIX  DISPOSITION OF SPECIMEN:  PATHOLOGY  COUNTS:  YES  PLAN OF CARE: Admit to inpatient   PATIENT DISPOSITION:  PACU - guarded condition.   INDICATIONS: Patient with concerning symptoms & work up suspicious for appendicitis.  Surgery was recommended:  The anatomy & physiology of the digestive tract was discussed.  The pathophysiology of appendicitis was discussed.  Natural history risks without surgery was discussed.   I feel the risks of no intervention will lead to serious problems that outweigh the operative risks; therefore, I recommended diagnostic laparoscopy with removal of appendix to remove the pathology.  Laparoscopic & open techniques were discussed.   I noted a good likelihood this will help address the problem.    Risks such as bleeding, infection, abscess, leak, reoperation, possible ostomy, hernia, heart attack, death, and other risks were discussed.  Goals of post-operative recovery were discussed as well.  We will work to minimize complications.  Questions were answered.  The patient  expresses understanding & wishes to proceed with surgery.  OR FINDINGS: Dense adhesions from prior laparotomy.  Involving middle and most of left abdominal wall  Gangrenous appendix and right lower quadrant going down to pelvis with pelvic abscess and focal peritonitis.  Proximal jejunal transition point concerning for partial small bowel obstruction due to retroperitoneal adhesions causing an internal hernia.  No necrosis or perforation or gangrene.  2 cm supraumbilical incisional hernia incarcerated with omentum.  Site of stapler and appendix removal.  Primarily repaired.  DESCRIPTION:   Informed consent was confirmed.  The patient underwent general anaesthesia without difficulty.  The patient was positioned appropriately.  VTE prevention in place.  The patient's abdomen was clipped, prepped, & draped in a sterile fashion.  Surgical timeout confirmed our plan.  Peritoneal entry with a laparoscopic port was obtained using optical entry technique in the left upper abdomen as the patient was positioned in reverse Trendelenburg.  Entry was clean.    I induced carbon dioxide insufflation. However there were dense adhesions in the upper abdomen.  Was able to carefully freed off some very thin wispy ones that clearly were omentum.  Was able to get between the stomach and falciform ligament to see the right upper quadrant which was rather clear.  I placed a port there.  Camera inspection revealed no injury.  Extra ports were carefully placed under direct laparoscopic visualization.  Proceeded with laparoscopic low adhesions primarily with scissors as well as some focused blunt dissection to free the greater omentum off the infraumbilical midline incision in the lower abdomen.  Freed back up more proximally.  Find a small but definite incisional hernia supraumbilically containing omentum only.  Reduce that down in place the 12 mm stapler port there.   All the rest the ports were 5 mm.    There was an  obvious peritonitis with phlegmon in the right lower quadrant.  Freed greater omentum off.  Freed off interloop inflammatory adhesions of small bowel to expose a gangrenous appendix densely adherent in the right lower quadrant going down to the pelvis.  Freeing off encountered an abscess cavity in the deep pelvis and release that.  Did laparoscopic irrigation of several liters of isotonic solution to clear the region up.  Became apparent that the sigmoid colon was stretched out and gently sweeping towards the anterior abdominal wall and dome of the bladder before coming down to the retroperitoneal reflection.  With that I could mobilize around the gangrenous appendix circumferentially and get it out of the pelvis.  Adherent to the dome of the bladder.   I mobilized the terminal ileum to proximal ascending colon in a lateral to medial fashion.  She had a decent cecal bascule that was quite floppy but no volvulus.  I took care to avoid injuring any retroperitoneal structures.    I elevated the appendix. I skeletonized and transected the mesoappendix using Harmonic ultrasonic dissection.  The mesoappendix. I was able to free off the base of the appendix which was still viable.  I stapled the appendix off the cecum using a laparoscopic stapler. I took a healthy cuff of viable cecum.  I placed the appendix inside and eco-sac and removed out the supraumbilical hernia.  The laparoscopic suture passer to pass #1 PDS interrupted suture around the fascia and held the clamp.  Replaced the port.  I did copious irrigation. Hemostasis was good in the mesoappendix, colon mesentery, and retroperitoneum. Staple line was intact on the cecum with no bleeding.  Proceeded to irrigate several more liters of saline gradually.  I washed out the pelvis, retrohepatic space and right paracolic gutter. I washed out the left side as well.  Hemostasis is good.  I ran the small bowel from the ileocecal valve more proximally encountered a  transition point with bowel adherent to a central retroperitoneal adhesion.  Freddrick March it off and straightened out the mesentery.  Freed off some dense interloop adhesions and unkinked the bowel.  Transition point went away.  I did careful inspection of the bowel to prove no perforation or injury.  I reinspected the left upper quadrant to confirm no injury or other abnormality from the initial port.    Because there is an obvious abscess with significant peritonitis and presence of gangrene the pelvis I decided to place a drain through the left lower quadrant port site down to the true pelvis.  I mobilized the right colon to straighten it out and allow the cecum to naturally fall down and feel up into the deep pelvis.  Ran from the ileocecal region more proximally to the mid jejunum where there were no adhesions or other abnormalities.  Seemed apparent that there were some old staple lines from prior small bowel resection but no adhesions or abnormalities there  I sterilely aspirated all the capnoperitoneum through the laparoscopic port using the Neptune filter system with a HEPA filter.  Ports removed.  PDS sutures around the small supraumbilical hernia were tied down.  Good closure. Drains secured with 2-0 Prolene suture.  I closed the rest of the port sites using 4-0 monocryl stitch.  Sterile dressings applied.  I asked anesthesia place an NG tube in anticipation of possible  postoperative ileus given the gangrenous appendicitis and partial small bowel obstruction  Patient was extubated and sent to the recovery room.  Suspect with the peritonitis and advanced age and gangrene, she is at a risk for postoperative ileus and will need to stay several days, possibly longer.  Antibiotics for 5 days.  I discussed operative findings, updated the patient's status, discussed probable steps to recovery, and gave postoperative recommendations to the Patient's daughter, Latoya Curry.  Recommendations were made.  Questions were  answered.  She expressed understanding & appreciation.  Adin Hector, M.D., F.A.C.S. Gastrointestinal and Minimally Invasive Surgery [facility] [address] [place], [place] [address] [phone] Main / Paging  06/24/2018 2:49 PM

## 2018-06-24 NOTE — H&P (Signed)
History and Physical    Latoya Curry:[id number] DOB: 1931-02-11 DOA: 06/23/2018  PCP: Josetta Huddle, MD  Patient coming from: [facility]  I have personally briefly reviewed patient's old medical records in [place]  Chief Complaint: Lower abdominal pain, fevers  HPI: Latoya Curry is a 85 y.o. female with medical history significant for hypertension, hyperlipidemia, ascending thoracic aortic aneurysm, hyperthyroidism s/p RAI therapy, and anxiety who presented to [place] ED with 2 days of lower abdominal pain, nausea without emesis, and intermittent fevers.  Her symptoms persisted and she had a telemetry visit with her PCP who recommended she be evaluated in the ED due to a prior history of diverticulitis.  Patient otherwise denies any diaphoresis, chest pain, palpitations, dyspnea, diarrhea, dysuria or urinary frequency.  [facility] ED Course:  Showed BP 118/75, pulse 87, RR 21, temp 101.8 Fahrenheit, SPO2 reportedly 86% on room air improved to 94% on 2 L supplemental O2 via [place].  Labs are notable for WBC 24.8, hemoglobin 15.1, platelets 239, sodium 133, potassium 2.6, bicarb 25, BUN 29, creatinine 0.88, serum glucose 130, lactic acid 1.4, magnesium 1.7.  Urinalysis showed positive nitrites, moderate leukocytes, and many bacteria on microscopy.  SARS-CoV-2 testing was obtained and resulted negative.  Portable chest x-ray showed stable mild cardiomegaly without focal consolidation, or effusion.  CT abdomen/pelvis with contrast was obtained which showed acute uncomplicated appendicitis with reactive free fluid within the dependent pelvis.  Diverticular disease without evidence of acute diverticulitis was noted as well as cholelithiasis inflammatory changes.  Patient was given IV potassium 10 mEq x 4 runs.  General surgery were consulted and recommended medical admission, starting IV Zosyn, and potential laparoscopic appendectomy in the morning.  The hospitalist service was  consulted to admit for further evaluation and management.  Review of Systems: As per HPI otherwise 10 point review of systems negative.    Past Medical History:  Diagnosis Date   Chronic low back pain    Diverticulitis    DJD (degenerative joint disease) of knee    Hypercholesteremia    Hypertension    Neurodermatitis    Obesity    Osteopenia    Recurrent UTI    Transient global amnesia     Past Surgical History:  Procedure Laterality Date   TOTAL KNEE ARTHROPLASTY     bilaterallly     reports that she has never smoked. She has never used smokeless tobacco. She reports that she does not drink alcohol or use drugs.  Allergies  Allergen Reactions   Codeine    Bactrim [Sulfamethoxazole-Trimethoprim]    Zestril [Lisinopril] Cough   Zocor [Simvastatin]     Family History  Problem Relation Age of Onset   Heart disease Father    Leukemia Mother      Prior to Admission medications   Medication Sig Start Date End Date Taking? Authorizing Provider  amLODipine (NORVASC) 2.5 MG tablet Take 2.5 mg by mouth daily.    [provider]  aspirin 81 MG tablet Take 81 mg by mouth. Three times per wk    [provider]  atenolol-chlorthalidone (TENORETIC) 50-25 MG per tablet Take 1 tablet by mouth daily.    [provider]  cholecalciferol (VITAMIN D) 1000 UNITS tablet Take 1,000 Units by mouth daily.    [provider]  citalopram (CELEXA) 20 MG tablet Take 20 mg by mouth daily.    [provider]  fesoterodine (TOVIAZ) 4 MG TB24 tablet Take 4 mg by  mouth daily.    [provider]  fluticasone (FLONASE) 50 MCG/ACT nasal spray Place 2 sprays into the nose daily.    [provider]  naproxen sodium (ALEVE) 220 MG tablet Take 220 mg by mouth daily as needed.    [provider]  omeprazole (PRILOSEC) 20 MG capsule Take 30- 60 min before your first and last meals of the day 09/23/11   Tanda Rockers,  MD  potassium chloride 20 MEQ TBCR Take 20 mEq by mouth 2 (two) times daily. 06/12/16   Palumbo, April, MD  rosuvastatin (CRESTOR) 10 MG tablet Take 5 mg by mouth 3 (three) times a week.     [provider]  traMADol (ULTRAM) 50 MG tablet Take 50 mg by mouth every 6 (six) hours as needed.    [provider]    Physical Exam: Vitals:   06/23/18 2230 06/23/18 2300 06/23/18 2330 06/24/18 0050  BP: 97/66 100/73 107/69 109/72  Pulse: 78 77 77 77  Resp: (!) 21 (!) 25 (!) 26 (!) 22  Temp:    99.1 F (37.3 C)  TempSrc:    Oral  SpO2: 97% 95% 98% 96%    Constitutional: Elderly woman resting supine in bed, NAD, calm, comfortable Eyes: PERRL, lids and conjunctivae normal ENMT: Mucous membranes are dry. Posterior pharynx clear of any exudate or lesions.Normal dentition.  Neck: normal, supple, no masses. Respiratory: Bibasilar inspiratory crackles. Normal respiratory effort. No accessory muscle use.  Cardiovascular: Regular rate and rhythm, no murmurs / rubs / gallops. No extremity edema. 2+ pedal pulses. Abdomen: Generalized tenderness to palpation more prominent in the lower abdominal quadrants. No hepatosplenomegaly. Bowel sounds diminished.  Musculoskeletal: no clubbing / cyanosis. No joint deformity upper and lower extremities. Good ROM, no contractures. Normal muscle tone.  Skin: no rashes, lesions, ulcers. No induration Neurologic: CN 2-12 grossly intact. Sensation intact. Strength 5/5 in all 4.  Psychiatric: Normal judgment and insight. Alert and oriented x 3. Normal mood.    Labs on Admission: I have personally reviewed following labs and imaging studies  CBC: Recent Labs  Lab 06/23/18 1719  WBC 24.8*  NEUTROABS 22.8*  HGB 15.1*  HCT 45.9  MCV 89.8  PLT 960   Basic Metabolic Panel: Recent Labs  Lab 06/23/18 1719  NA 133*  K 2.6*  CL 95*  CO2 25  GLUCOSE 130*  BUN 29*  CREATININE 0.88  CALCIUM 9.2  MG 1.7   GFR: CrCl cannot be calculated (Unknown  ideal weight.). Liver Function Tests: Recent Labs  Lab 06/23/18 1719  AST 29  ALT 16  ALKPHOS 58  BILITOT 1.7*  PROT 7.6  ALBUMIN 4.0   No results for input(s): LIPASE, AMYLASE in the last 168 hours. No results for input(s): AMMONIA in the last 168 hours. Coagulation Profile: No results for input(s): INR, PROTIME in the last 168 hours. Cardiac Enzymes: No results for input(s): CKTOTAL, CKMB, CKMBINDEX, TROPONINI in the last 168 hours. BNP (last 3 results) No results for input(s): PROBNP in the last 8760 hours. HbA1C: No results for input(s): HGBA1C in the last 72 hours. CBG: No results for input(s): GLUCAP in the last 168 hours. Lipid Profile: No results for input(s): CHOL, HDL, LDLCALC, TRIG, CHOLHDL, LDLDIRECT in the last 72 hours. Thyroid Function Tests: No results for input(s): TSH, T4TOTAL, FREET4, T3FREE, THYROIDAB in the last 72 hours. Anemia Panel: No results for input(s): VITAMINB12, FOLATE, FERRITIN, TIBC, IRON, RETICCTPCT in the last 72 hours. Urine analysis:  Component Value Date/Time   COLORURINE YELLOW 06/23/2018 1719   APPEARANCEUR CLOUDY (A) 06/23/2018 1719   LABSPEC >1.030 (H) 06/23/2018 1719   PHURINE 5.5 06/23/2018 1719   GLUCOSEU NEGATIVE 06/23/2018 1719   HGBUR SMALL (A) 06/23/2018 1719   BILIRUBINUR NEGATIVE 06/23/2018 1719   KETONESUR NEGATIVE 06/23/2018 1719   PROTEINUR NEGATIVE 06/23/2018 1719   UROBILINOGEN 0.2 01/08/2007 2322   NITRITE POSITIVE (A) 06/23/2018 1719   LEUKOCYTESUR MODERATE (A) 06/23/2018 1719    Radiological Exams on Admission: Ct Abdomen Pelvis W Contrast  Result Date: 06/23/2018 CLINICAL DATA:  85 year old female with lower abdominal pain EXAM: CT ABDOMEN AND PELVIS WITH CONTRAST TECHNIQUE: Multidetector CT imaging of the abdomen and pelvis was performed using the standard protocol following bolus administration of intravenous contrast. CONTRAST:  113mL OMNIPAQUE IOHEXOL 300 MG/ML  SOLN COMPARISON:  None. FINDINGS: Lower  chest: No acute finding of the lower chest. Atelectasis/scarring. Hepatobiliary: Unremarkable liver. Cholelithiasis. No associated inflammatory changes. Pancreas: Punctate calcifications of pancreas with no inflammatory changes. Spleen: Unremarkable Adrenals/Urinary Tract: Unremarkable adrenal glands. No hydronephrosis or nephrolithiasis. Unremarkable course the bilateral ureters. Unremarkable urinary bladder. Stomach/Bowel: Unremarkable stomach. Small bowel decompressed without abnormal distention. No focal wall thickening of small bowel. Inflammatory changes of the appendix which is dilated, fluid-filled, with fecalith at the base. No evidence of perforation or abscess. Extensive diverticular disease without evidence of acute diverticulitis. Vascular/Lymphatic: Atherosclerosis.  No adenopathy. Reproductive: Fibroid uterus with internal calcifications, otherwise unremarkable. Pessary in place. Other: Reactive fluid within the dependent pelvis.  No hernia. Musculoskeletal: Compression fracture of T12 is unchanged from previous CT. No acute displaced fracture. Multilevel degenerative changes. IMPRESSION: Acute uncomplicated appendicitis. These results were discussed by telephone at the time of interpretation on 06/23/2018 at 8:55 pm with Dr. Gareth Morgan. Reactive free fluid within the dependent pelvis. Diverticular disease without evidence of acute diverticulitis. Cholelithiasis. Aortic Atherosclerosis (ICD10-I70.0). Ancillary findings as above. Electronically Signed   By: Corrie Mckusick D.O.   On: 06/23/2018 20:56   Dg Chest Portable 1 View  Result Date: 06/23/2018 CLINICAL DATA:  Fever. Hypoxia EXAM: PORTABLE CHEST 1 VIEW COMPARISON:  06/12/2016 FINDINGS: Stable mild cardiomegaly. Aortic atherosclerosis. Both lungs are clear. IMPRESSION: Stable mild cardiomegaly. No active lung disease. Electronically Signed   By: Earle Gell M.D.   On: 06/23/2018 18:24    EKG: Independently reviewed. Sinus rhythm with  PVC, nonspecific ST changes V4-V5.  Assessment/Plan Principal Problem:   Acute appendicitis Active Problems:   Essential hypertension   Hypokalemia  Latoya Curry is a 85 y.o. female with medical history significant for hypertension, hyperlipidemia, ascending thoracic aortic aneurysm, hyperthyroidism s/p RAI therapy, and anxiety who is admitted with acute uncomplicated appendicitis.  Acute appendicitis: General surgery consulted for potential left appendectomy in a.m.  She is currently hemodynamically stable. -Continue IV Zosyn -Keep n.p.o. and continue IVFs overnight  Hypokalemia: Received IV supplementation in the ED.  Will replete mag and additional potassium and recheck in a.m.  Hypertension: -Resume home amlodipine and atenolol-chlorthalidone in a.m.  Asymptomatic bacteriuria: Patient currently denies any urinary symptoms, however is receiving IV Zosyn for appendicitis as above.  Ascending thoracic aortic aneurysm: Follows with cardiothoracic surgery, Dr. Prescott Gum.  Ascending aortic root diameter stable at 5.4 cm.  She is recommended to continue medical therapy and blood pressure control.  Hyperthyroidism: Underwent radioactive iodine therapy for apparently clinical hyperthyroidism January 2020.  This is being followed by her PCP as an outpatient, she says that she is currently not requiring thyroid hormone  replacement therapy.  Anxiety: -Continue citalopram  Hyperlipidemia: -Continue rosuvastatin  DVT prophylaxis: SCDs Code Status: Full code, confirmed with patient Family Communication: None present on admission Disposition Plan: Pending surgical intervention and clearance Consults called: General surgery Admission status: Inpatient   Zada Finders MD [facility] Pager [phone]  If 7PM-7AM, please contact night-coverage [email or web address]  06/24/2018, 2:05 AM

## 2018-06-24 NOTE — Transfer of Care (Signed)
Immediate Anesthesia Transfer of Care Note  Patient: Latoya Curry  Procedure(s) Performed: ACUTE GANGRENOUS APPENDICITIS WITH ABSCESS, PARTIAL SMALL BOWEL OBSTRUCTION, INCISIONAL HERNIA REPAIR, LYSIS OF ADHESIONS (N/A Abdomen)  Patient Location: PACU  Anesthesia Type:General  Level of Consciousness: awake and drowsy  Airway & Oxygen Therapy: Patient Spontanous Breathing and Patient connected to face mask oxygen  Post-op Assessment: Report given to RN and Post -op Vital signs reviewed and stable  Post vital signs: Reviewed and stable  Last Vitals:  Vitals Value Taken Time  BP 123/64 06/24/2018  2:51 PM  Temp    Pulse 96 06/24/2018  2:53 PM  Resp 22 06/24/2018  2:53 PM  SpO2 98 % 06/24/2018  2:53 PM  Vitals shown include unvalidated device data.  Last Pain:  Vitals:   06/24/18 1111  TempSrc:   PainSc: 0-No pain         Complications: No apparent anesthesia complications

## 2018-06-24 NOTE — Anesthesia Postprocedure Evaluation (Signed)
Anesthesia Post Note  Patient: DAN DISSINGER  Procedure(s) Performed: ACUTE GANGRENOUS APPENDICITIS WITH ABSCESS, PARTIAL SMALL BOWEL OBSTRUCTION, INCISIONAL HERNIA REPAIR, LYSIS OF ADHESIONS (N/A Abdomen)     Patient location during evaluation: PACU Anesthesia Type: General Level of consciousness: awake and alert Pain management: pain level controlled Vital Signs Assessment: post-procedure vital signs reviewed and stable Respiratory status: spontaneous breathing, nonlabored ventilation, respiratory function stable and patient connected to nasal cannula oxygen Cardiovascular status: blood pressure returned to baseline and stable Postop Assessment: no apparent nausea or vomiting Anesthetic complications: no    Last Vitals:  Vitals:   06/24/18 1531 06/24/18 1600  BP: 106/66 (!) 127/57  Pulse: 94 88  Resp: 19 18  Temp: 36.8 C 36.4 C  SpO2: 99% 96%    Last Pain:  Vitals:   06/24/18 1600  TempSrc: Oral  PainSc:                  Tiajuana Amass

## 2018-06-24 NOTE — Interval H&P Note (Signed)
History and Physical Interval Note:  06/24/2018 12:05 PM  Latoya Curry  has presented today for surgery, with the diagnosis of appendicitis.  The various methods of treatment have been discussed with the patient and family. After consideration of risks, benefits and other options for treatment, the patient has consented to  Procedure(s): APPENDECTOMY LAPAROSCOPIC (N/A) as a surgical intervention.  The patient's history has been reviewed, patient examined, no change in status, stable for surgery.  I have reviewed the patient's chart and labs.  Questions were answered to the patient's satisfaction.  The anatomy & physiology of the digestive tract was discussed.  The pathophysiology of appendicitis and other appendiceal disorders were discussed.  Natural history risks without surgery was discussed.   I feel the risks of no intervention will lead to serious problems that outweigh the operative risks; therefore, I recommended diagnostic laparoscopy with removal of appendix to remove the pathology.  Laparoscopic & open techniques were discussed.   I noted a good likelihood this will help address the problem.   Risks such as bleeding, infection, abscess, leak, reoperation, injury to other organs, need for repair of tissues / organs, possible ostomy, hernia, heart attack, stroke, death, and other risks were discussed.  Goals of post-operative recovery were discussed as well.  We will work to minimize complications.  Questions were answered.  The patient expresses understanding & wishes to proceed with surgery.   I have re-reviewed the the patient's records, history, medications, and allergies.  I have re-examined the patient.  I again discussed intraoperative plans and goals of post-operative recovery.  The patient agrees to proceed.  Latoya Curry  [birth year] [id number]  Patient Care Team: Josetta Huddle, MD as PCP - General (Internal Medicine) Josetta Huddle, MD as Attending Physician (Internal  Medicine)  Patient Active Problem List   Diagnosis Date Noted  . Hypokalemia 06/24/2018  . Acute appendicitis 06/23/2018  . Aortic root enlargement ([place]) 08/19/2017  . Ascending aorta dilation ([facility]) 08/19/2017  . Essential hypertension 08/05/2017  . Mixed dyslipidemia 08/05/2017  . PVC (premature ventricular contraction) 08/05/2017  . Chest discomfort 08/05/2017  . Cough 09/23/2011    Past Medical History:  Diagnosis Date  . Chronic low back pain   . Diverticulitis   . DJD (degenerative joint disease) of knee   . Hypercholesteremia   . Hypertension   . Neurodermatitis   . Obesity   . Osteopenia   . Recurrent UTI   . Thoracic aortic aneurysm ([place])   . Transient global amnesia     Past Surgical History:  Procedure Laterality Date  . COLON SURGERY    . TOTAL KNEE ARTHROPLASTY     bilaterallly    Social History   Socioeconomic History  . Marital status: Widowed    Spouse name: Not on file  . Number of children: 3  . Years of education: Not on file  . Highest education level: Not on file  Occupational History  . 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 Financial resource strain: Not on file  . Food insecurity:    Worry: Not on file    Inability: Not on file  . Transportation needs:    Medical: Not on file    Non-medical: Not on file  Tobacco Use  . Smoking status: Never Smoker  . Smokeless tobacco: Never Used  Substance and Sexual Activity  . Alcohol use: No  . Drug use: No  . Sexual activity: Not on file  Lifestyle  . Physical activity:    Days per week: Not on file    Minutes per session: Not on file  . Stress: Not on file  Relationships  . Social connections:    Talks on phone: Not on file    Gets together: Not on file    Attends religious service: Not on file    Active member of club or organization: Not on file    Attends meetings of clubs or organizations: Not on file    Relationship status: Not on file  .  Intimate partner violence:    Fear of current or ex partner: Not on file    Emotionally abused: Not on file    Physically abused: Not on file    Forced sexual activity: Not on file  Other Topics Concern  . Not on file  Social History Narrative  . Not on file    Family History  Problem Relation Age of Onset  . Heart disease Father   . Leukemia Mother     Medications Prior to Admission  Medication Sig Dispense Refill Last Dose  . amLODipine (NORVASC) 2.5 MG tablet Take 2.5 mg by mouth daily.   06/23/2018 at Unknown time  . aspirin 81 MG tablet Take 81 mg by mouth See admin instructions. Monday, Wednesday and Friday   06/23/2018 at Unknown time  . atenolol-chlorthalidone (TENORETIC) 50-25 MG per tablet Take 1 tablet by mouth daily.   06/23/2018 at 0800  . cetirizine (ZYRTEC) 10 MG tablet Take 10 mg by mouth daily.   06/23/2018 at Unknown time  . cholecalciferol (VITAMIN D) 1000 UNITS tablet Take 1,000 Units by mouth daily.   06/23/2018 at Unknown time  . citalopram (CELEXA) 20 MG tablet Take 20 mg by mouth daily.   06/23/2018 at Unknown time  . omeprazole (PRILOSEC) 20 MG capsule Take 30- 60 min before your first and last meals of the day   06/23/2018 at Unknown time  . potassium chloride (K-DUR) 10 MEQ tablet Take 10 mEq by mouth daily.   06/23/2018 at Unknown time  . rosuvastatin (CRESTOR) 10 MG tablet Take 5 mg by mouth See admin instructions. Monday, Wednesday and Friday   06/23/2018 at Unknown time  . TOVIAZ 8 MG TB24 tablet Take 8 mg by mouth daily.   06/23/2018 at Unknown time    Current Facility-Administered Medications  Medication Dose Route Frequency Provider Last Rate Last Dose  . [MAR Hold] acetaminophen (TYLENOL) tablet 650 mg  650 mg Oral Q6H PRN Lenore Cordia, MD   650 mg at 06/24/18 0226   Or  . [MAR Hold] acetaminophen (TYLENOL) suppository 650 mg  650 mg Rectal Q6H PRN Lenore Cordia, MD      . Doug Sou Hold] amLODipine (NORVASC) tablet 2.5 mg  2.5 mg Oral Daily Lenore Cordia, MD   Stopped at 06/24/18 [phone]  . [MAR Hold] atenolol (TENORMIN) tablet 50 mg  50 mg Oral Daily Lenore Cordia, MD   Stopped at 06/24/18 [phone]   And  . [MAR Hold] chlorthalidone (HYGROTON) tablet 25 mg  25 mg Oral Daily Lenore Cordia, MD   Stopped at 06/24/18 0901  . cefoTEtan (CEFOTAN) 2 g in sodium chloride 0.9 % 100 mL IVPB  2 g Intravenous On Call to OR Michael Boston, MD      . Chlorhexidine Gluconate Cloth 2 % PADS 6 each  6 each Topical Once Michael Boston, MD       And  .  Chlorhexidine Gluconate Cloth 2 % PADS 6 each  6 each Topical Once Michael Boston, MD      . Doug Sou Hold] citalopram (CELEXA) tablet 20 mg  20 mg Oral Daily Lenore Cordia, MD   Stopped at 06/24/18 [phone]  . lactated ringers infusion   Intravenous Continuous Suzette Battiest, MD 100 mL/hr at 06/24/18 1132    . [MAR Hold] ondansetron (ZOFRAN) tablet 4 mg  4 mg Oral Q6H PRN Lenore Cordia, MD       Or  . Doug Sou Hold] ondansetron (ZOFRAN) injection 4 mg  4 mg Intravenous Q6H PRN Lenore Cordia, MD      . Doug Sou Hold] piperacillin-tazobactam (ZOSYN) IVPB 3.375 g  3.375 g Intravenous Q8H Zada Finders R, MD 12.5 mL/hr at 06/24/18 0852 3.375 g at 06/24/18 0852  . [MAR Hold] rosuvastatin (CRESTOR) tablet 5 mg  5 mg Oral Once per day on Mon Wed Fri Lenore Cordia, MD         Allergies  Allergen Reactions  . Codeine Nausea And Vomiting  . Bactrim [Sulfamethoxazole-Trimethoprim] Other (See Comments)    unknown  . Zestril [Lisinopril] Cough  . Zocor [Simvastatin] Other (See Comments)    unknown    BP (!) 112/57   Pulse 90   Temp 98.5 F (36.9 C)   Resp 15   Wt 71.5 kg   SpO2 92%   BMI 25.44 kg/m   Labs: Results for orders placed or performed during the hospital encounter of 06/23/18 (from the past 48 hour(s))  CBC with Differential     Status: Abnormal   Collection Time: 06/23/18  5:19 PM  Result Value Ref Range   WBC 24.8 (H) 4.0 - 10.5 K/uL   RBC 5.11 3.87 - 5.11 MIL/uL   Hemoglobin 15.1 (H) 12.0 - 15.0  g/dL   HCT 45.9 36.0 - 46.0 %   MCV 89.8 80.0 - 100.0 fL   MCH 29.5 26.0 - 34.0 pg   MCHC 32.9 30.0 - 36.0 g/dL   RDW 14.1 11.5 - 15.5 %   Platelets 239 150 - 400 K/uL   nRBC 0.0 0.0 - 0.2 %   Neutrophils Relative % 92 %   Neutro Abs 22.8 (H) 1.7 - 7.7 K/uL   Lymphocytes Relative 4 %   Lymphs Abs 0.9 0.7 - 4.0 K/uL   Monocytes Relative 3 %   Monocytes Absolute 0.8 0.1 - 1.0 K/uL   Eosinophils Relative 0 %   Eosinophils Absolute 0.0 0.0 - 0.5 K/uL   Basophils Relative 0 %   Basophils Absolute 0.0 0.0 - 0.1 K/uL   WBC Morphology TOXIC GRANULATION    RBC Morphology MORPHOLOGY UNREMARKABLE    Smear Review MORPHOLOGY UNREMARKABLE    Immature Granulocytes 1 %   Abs Immature Granulocytes 0.17 (H) 0.00 - 0.07 K/uL    Comment: Performed at [facility], [address] [place], [place] [address]  Comprehensive metabolic panel     Status: Abnormal   Collection Time: 06/23/18  5:19 PM  Result Value Ref Range   Sodium 133 (L) 135 - 145 mmol/L   Potassium 2.6 (LL) 3.5 - 5.1 mmol/L    Comment: CRITICAL RESULT CALLED TO, READ BACK BY AND VERIFIED WITH: MARVA SIMMS RN @1804  06/23/2018 OLSONM    Chloride 95 (L) 98 - 111 mmol/L   CO2 25 22 - 32 mmol/L   Glucose, Bld 130 (H) 70 - 99 mg/dL   BUN 29 (H) 8 - 23  mg/dL   Creatinine, Ser 0.88 0.44 - 1.00 mg/dL   Calcium 9.2 8.9 - 10.3 mg/dL   Total Protein 7.6 6.5 - 8.1 g/dL   Albumin 4.0 3.5 - 5.0 g/dL   AST 29 15 - 41 U/L   ALT 16 0 - 44 U/L   Alkaline Phosphatase 58 38 - 126 U/L   Total Bilirubin 1.7 (H) 0.3 - 1.2 mg/dL   GFR calc non Af Amer 59 (L) >60 mL/min   GFR calc Af Amer >60 >60 mL/min   Anion gap 13 5 - 15    Comment: Performed at [facility], [address] [place], [place] [address]  Lactic acid, plasma     Status: None   Collection Time: 06/23/18  5:19 PM  Result Value Ref Range   Lactic Acid, Venous 1.4 0.5 - 1.9 mmol/L    Comment: Performed at [facility], [address] [place], [place] [address]  Urinalysis, Routine w reflex microscopic     Status: Abnormal   Collection Time: 06/23/18  5:19 PM  Result Value Ref Range   Color, Urine YELLOW YELLOW   APPearance CLOUDY (A) CLEAR   Specific Gravity, Urine >1.030 (H) 1.005 - 1.030   pH 5.5 5.0 - 8.0   Glucose, UA NEGATIVE NEGATIVE mg/dL   Hgb urine dipstick SMALL (A) NEGATIVE   Bilirubin Urine NEGATIVE NEGATIVE   Ketones, ur NEGATIVE NEGATIVE mg/dL   Protein, ur NEGATIVE NEGATIVE mg/dL   Nitrite POSITIVE (A) NEGATIVE   Leukocytes,Ua MODERATE (A) NEGATIVE    Comment: Performed at [facility], [address] [place], [place] [address]  Magnesium     Status: None   Collection Time: 06/23/18  5:19 PM  Result Value Ref Range   Magnesium 1.7 1.7 - 2.4 mg/dL    Comment: Performed at [facility], [address] [place], [place] [address]  Urinalysis, Microscopic (reflex)     Status: Abnormal   Collection Time: 06/23/18  5:19 PM  Result Value Ref Range   RBC / HPF 6-10 0 - 5 RBC/hpf   WBC, UA >50 0 - 5 WBC/hpf   Bacteria, UA MANY (A) NONE SEEN   Squamous Epithelial / LPF 0-5 0 - 5   Mucus PRESENT     Comment: Performed at [facility], [address] [place], [place] [address]  Blood culture (routine x 2)     Status: None (Preliminary result)   Collection Time: 06/23/18  5:20 PM  Result Value Ref Range   Specimen Description      BLOOD BLOOD LEFT FOREARM Performed at [facility], [address] [place], [place] [address]    Special Requests      BOTTLES DRAWN AEROBIC AND ANAEROBIC Blood Culture adequate volume Performed at [facility], [address] [place], [place] [address]    Culture      NO GROWTH < 12 HOURS Performed at [place] [address], [place], [place] [address]    Report Status PENDING   Blood culture (routine x 2)     Status: None (Preliminary result)   Collection Time: 06/23/18  6:10 PM  Result Value Ref Range    Specimen Description      BLOOD BLOOD RIGHT ARM Performed at [facility], [address], [place], [place] [address]    Special Requests  BOTTLES DRAWN AEROBIC AND ANAEROBIC Blood Culture adequate volume Performed at [facility], [address] [place], [place] [address]    Culture      NO GROWTH < 12 HOURS Performed at [place] [address], [place], [place] [address]    Report Status PENDING   SARS Coronavirus 2 [facility] order, Performed in [place] hospital lab)     Status: None   Collection Time: 06/23/18  7:14 PM  Result Value Ref Range   SARS Coronavirus 2 NEGATIVE NEGATIVE    Comment: (NOTE) If result is NEGATIVE SARS-CoV-2 target nucleic acids are NOT DETECTED. The SARS-CoV-2 RNA is generally detectable in upper and lower  respiratory specimens during the acute phase of infection. The lowest  concentration of SARS-CoV-2 viral copies this assay can detect is 250  copies / mL. A negative result does not preclude SARS-CoV-2 infection  and should not be used as the sole basis for treatment or other  patient management decisions.  A negative result may occur with  improper specimen collection / handling, submission of specimen other  than nasopharyngeal swab, presence of viral mutation(s) within the  areas targeted by this assay, and inadequate number of viral copies  (<250 copies / mL). A negative result must be combined with clinical  observations, patient history, and epidemiological information. If result is POSITIVE SARS-CoV-2 target nucleic acids are DETECTED. The SARS-CoV-2 RNA is generally detectable in upper and lower  respiratory specimens dur ing the acute phase of infection.  Positive  results are indicative of active infection with SARS-CoV-2.  Clinical  correlation with patient history and other diagnostic information is  necessary to determine patient infection status.  Positive results do  not rule out bacterial  infection or co-infection with other viruses. If result is PRESUMPTIVE POSTIVE SARS-CoV-2 nucleic acids MAY BE PRESENT.   A presumptive positive result was obtained on the submitted specimen  and confirmed on repeat testing.  While 2019 novel coronavirus  (SARS-CoV-2) nucleic acids may be present in the submitted sample  additional confirmatory testing may be necessary for epidemiological  and / or clinical management purposes  to differentiate between  SARS-CoV-2 and other Sarbecovirus currently known to infect humans.  If clinically indicated additional testing with an alternate test  methodology [phone]) is advised. The SARS-CoV-2 RNA is generally  detectable in upper and lower respiratory sp ecimens during the acute  phase of infection. The expected result is Negative. Fact Sheet for Patients:  StrictlyIdeas.no Fact Sheet for Healthcare Providers: BankingDealers.co.za This test is not yet approved or cleared by the [place] FDA and has been authorized for detection and/or diagnosis of SARS-CoV-2 by FDA under an Emergency Use Authorization (EUA).  This EUA will remain in effect (meaning this test can be used) for the duration of the COVID-19 declaration under Section 564(b)(1) of the Act, 21 U.S.C. section 360bbb-3(b)(1), unless the authorization is terminated or revoked sooner. Performed at [facility], [place] [address] [place], [place] [address]   MRSA PCR Screening     Status: None   Collection Time: 06/24/18  2:11 AM  Result Value Ref Range   MRSA by PCR NEGATIVE NEGATIVE    Comment:        The GeneXpert MRSA Assay (FDA approved for NASAL specimens only), is one component of a comprehensive MRSA colonization surveillance program. It is not intended to diagnose MRSA infection nor to guide or monitor treatment for MRSA infections. Performed at  [facility], [place] [address], [place], [place]  [address]   Basic metabolic panel     Status: Abnormal   Collection Time: 06/24/18  8:48 AM  Result Value Ref Range   Sodium 136 135 - 145 mmol/L   Potassium 3.6 3.5 - 5.1 mmol/L   Chloride 103 98 - 111 mmol/L   CO2 22 22 - 32 mmol/L   Glucose, Bld 101 (H) 70 - 99 mg/dL   BUN 32 (H) 8 - 23 mg/dL   Creatinine, Ser 1.01 (H) 0.44 - 1.00 mg/dL   Calcium 9.0 8.9 - 10.3 mg/dL   GFR calc non Af Amer 50 (L) >60 mL/min   GFR calc Af Amer 58 (L) >60 mL/min   Anion gap 11 5 - 15    Comment: Performed at [facility], [place] [address] [place], [place] [address]  CBC     Status: Abnormal   Collection Time: 06/24/18  8:48 AM  Result Value Ref Range   WBC 20.5 (H) 4.0 - 10.5 K/uL   RBC 4.83 3.87 - 5.11 MIL/uL   Hemoglobin 14.5 12.0 - 15.0 g/dL   HCT 45.6 36.0 - 46.0 %   MCV 94.4 80.0 - 100.0 fL   MCH 30.0 26.0 - 34.0 pg   MCHC 31.8 30.0 - 36.0 g/dL   RDW 14.4 11.5 - 15.5 %   Platelets 230 150 - 400 K/uL   nRBC 0.0 0.0 - 0.2 %    Comment: Performed at [facility], [place] [address] [place], [place] [address]  Magnesium     Status: None   Collection Time: 06/24/18  8:48 AM  Result Value Ref Range   Magnesium 2.3 1.7 - 2.4 mg/dL    Comment: Performed at [facility], [place] [address], [place], [place] [address]    Imaging / Studies: Ct Abdomen Pelvis W Contrast  Result Date: 06/23/2018 CLINICAL DATA:  85 year old female with lower abdominal pain EXAM: CT ABDOMEN AND PELVIS WITH CONTRAST TECHNIQUE: Multidetector CT imaging of the abdomen and pelvis was performed using the standard protocol following bolus administration of intravenous contrast. CONTRAST:  175mL OMNIPAQUE IOHEXOL 300 MG/ML  SOLN COMPARISON:  None. FINDINGS: Lower chest: No acute finding of the lower chest. Atelectasis/scarring. Hepatobiliary: Unremarkable liver. Cholelithiasis. No associated inflammatory changes. Pancreas: Punctate calcifications of pancreas with no inflammatory  changes. Spleen: Unremarkable Adrenals/Urinary Tract: Unremarkable adrenal glands. No hydronephrosis or nephrolithiasis. Unremarkable course the bilateral ureters. Unremarkable urinary bladder. Stomach/Bowel: Unremarkable stomach. Small bowel decompressed without abnormal distention. No focal wall thickening of small bowel. Inflammatory changes of the appendix which is dilated, fluid-filled, with fecalith at the base. No evidence of perforation or abscess. Extensive diverticular disease without evidence of acute diverticulitis. Vascular/Lymphatic: Atherosclerosis.  No adenopathy. Reproductive: Fibroid uterus with internal calcifications, otherwise unremarkable. Pessary in place. Other: Reactive fluid within the dependent pelvis.  No hernia. Musculoskeletal: Compression fracture of T12 is unchanged from previous CT. No acute displaced fracture. Multilevel degenerative changes. IMPRESSION: Acute uncomplicated appendicitis. These results were discussed by telephone at the time of interpretation on 06/23/2018 at 8:55 pm with Dr. Gareth Morgan. Reactive free fluid within the dependent pelvis. Diverticular disease without evidence of acute diverticulitis. Cholelithiasis. Aortic Atherosclerosis (ICD10-I70.0). Ancillary findings as above. Electronically Signed   By: Corrie Mckusick D.O.   On: 06/23/2018 20:56   Dg Chest Portable 1 View  Result Date: 06/23/2018 CLINICAL DATA:  Fever. Hypoxia EXAM: PORTABLE CHEST 1 VIEW COMPARISON:  06/12/2016 FINDINGS:  Stable mild cardiomegaly. Aortic atherosclerosis. Both lungs are clear. IMPRESSION: Stable mild cardiomegaly. No active lung disease. Electronically Signed   By: Earle Gell M.D.   On: 06/23/2018 18:24     .Adin Hector, M.D., F.A.C.S. Gastrointestinal and Minimally Invasive Surgery [facility] [address], [place], [place] [address] [phone] Main / Paging  06/24/2018 12:06 PM     Adin Hector

## 2018-06-24 NOTE — Anesthesia Preprocedure Evaluation (Addendum)
Anesthesia Evaluation  Patient identified by MRN, date of birth, ID band Patient awake    Reviewed: Allergy & Precautions, NPO status , Patient's Chart, lab work & pertinent test results, reviewed documented beta blocker date and time   Airway Mallampati: II  TM Distance: >3 FB Neck ROM: Full    Dental  (+) Dental Advisory Given   Pulmonary neg pulmonary ROS,    breath sounds clear to auscultation       Cardiovascular hypertension, Pt. on medications and Pt. on home beta blockers  Rhythm:Regular Rate:Normal  Low risk nuclear stress test in 2019. Low normal EF.  Known ascending aortic aneurysm. Unchanged in size on most recent scan 02/2018   Neuro/Psych negative neurological ROS     GI/Hepatic Neg liver ROS, Acute appendicitis    Endo/Other  negative endocrine ROS  Renal/GU negative Renal ROS     Musculoskeletal  (+) Arthritis ,   Abdominal   Peds  Hematology negative hematology ROS (+)   Anesthesia Other Findings   Reproductive/Obstetrics                            Lab Results  Component Value Date   WBC 20.5 (H) 06/24/2018   HGB 14.5 06/24/2018   HCT 45.6 06/24/2018   MCV 94.4 06/24/2018   PLT 230 06/24/2018   Lab Results  Component Value Date   CREATININE 1.01 (H) 06/24/2018   BUN 32 (H) 06/24/2018   NA 136 06/24/2018   K 3.6 06/24/2018   CL 103 06/24/2018   CO2 22 06/24/2018    Anesthesia Physical Anesthesia Plan  ASA: III  Anesthesia Plan: General   Post-op Pain Management:    Induction: Intravenous  PONV Risk Score and Plan: 3 and Dexamethasone, Ondansetron and Treatment may vary due to age or medical condition  Airway Management Planned: Oral ETT  Additional Equipment:   Intra-op Plan:   Post-operative Plan: Extubation in OR  Informed Consent: I have reviewed the patients History and Physical, chart, labs and discussed the procedure including the risks,  benefits and alternatives for the proposed anesthesia with the patient or authorized representative who has indicated his/her understanding and acceptance.     Dental advisory given  Plan Discussed with: CRNA  Anesthesia Plan Comments:        Anesthesia Quick Evaluation

## 2018-06-24 NOTE — Progress Notes (Signed)
PROGRESS NOTE    Latoya Curry  XVQ:[id number] DOB: 1930/07/28 DOA: 06/23/2018 PCP: Josetta Huddle, MD   Brief Narrative:  HPI on 06/24/2018 Latoya Curry is a 85 y.o. female with medical history significant for hypertension, hyperlipidemia, ascending thoracic aortic aneurysm, hyperthyroidism s/p RAI therapy, and anxiety who presented to [facility] ED with 2 days of lower abdominal pain, nausea without emesis, and intermittent fevers.  Her symptoms persisted and she had a telemetry visit with her PCP who recommended she be evaluated in the ED due to a prior history of diverticulitis.  Patient otherwise denies any diaphoresis, chest pain, palpitations, dyspnea, diarrhea, dysuria or urinary frequency.  Assessment & Plan   Sepsis secondary to Acute appendicitis vs UTI -Sepsis present on admission, as patient presented with fever, tachypnea, leukocytosis -Urine was cloudy in appearance, many bacteria, positive nitrites, moderate leukocytes,>50WBC (however patient states that she has not had any burning or pain with urination) -CT abdomen pelvis showed acute uncomplicated appendicitis -Urine culture pending -Blood culture showed no growth to date -Continue Zosyn -Surgery consulted and appreciated -Patient is low risk for surgery  Hypokalemia -Resolved with replacement -Magnesium 2.3 -Continue to monitor BMP  Asymptomatic bacteriuria -See discussion as listed above -Urine culture pending -Continue Zosyn  Essential hypertension -Blood pressure stable, continue amlodipine, atenolol  Ascending thoracic aortic aneurysm -Patient follows with cardiothoracic surgery, Dr. Prescott Gum.  Stable, 5.4 cm. -Continue medical therapy with blood pressure control  Hyperthyroidism -Underwent radioactive iodine therapy in January 2020 and is currently followed by her PCP as an outpatient.  Currently not on any type of replacement therapy.  Anxiety/depression -Continue Celexa   Hyperlipidemia -Continue statin  DVT Prophylaxis  SCDs  Code Status: Full  Family Communication: None at bedside  Disposition Plan: Admitted. Pending surgery today.  Consultants General surgery  Procedures  None  Antibiotics   Anti-infectives (From admission, onward)   Start     Dose/Rate Route Frequency Ordered Stop   06/24/18 1115  cefoTEtan (CEFOTAN) 2 g in sodium chloride 0.9 % 100 mL IVPB     2 g 200 mL/hr over 30 Minutes Intravenous On call to O.R. 06/24/18 1102 06/24/18 1237   06/24/18 0200  [MAR Hold]  piperacillin-tazobactam (ZOSYN) IVPB 3.375 g     (MAR Hold since Thu 06/24/2018 at 1049. Reason: Transfer to a Procedural area.)   3.375 g 12.5 mL/hr over 240 Minutes Intravenous Every 8 hours 06/24/18 0110     06/23/18 1730  piperacillin-tazobactam (ZOSYN) IVPB 3.375 g     3.375 g 100 mL/hr over 30 Minutes Intravenous  Once 06/23/18 1715 06/24/18 1133      Subjective:   Latoya Curry seen and examined today.  Continues to have abdominal pain and nausea. No vomiting. Denies chest pain, shortness of breath, headache, dizziness.    Objective:   Vitals:   06/24/18 0050 06/24/18 0510 06/24/18 0630 06/24/18 1053  BP: 109/72 107/64  (!) 112/57  Pulse: 77 77  90  Resp: (!) 22 20  15   Temp: 99.1 F (37.3 C) 98 F (36.7 C)  98.5 F (36.9 C)  TempSrc: Oral Oral    SpO2: 96% 95%  92%  Weight:   71.5 kg     Intake/Output Summary (Last 24 hours) at 06/24/2018 1325 Last data filed at 06/24/2018 1019 Gross per 24 hour  Intake 744.03 ml  Output 700 ml  Net 44.03 ml   Filed Weights   06/24/18 0630  Weight: 71.5 kg  Exam No repeat exam today. Patient admitted earlier today.   Data Reviewed: I have personally reviewed following labs and imaging studies  CBC: Recent Labs  Lab 06/23/18 1719 06/24/18 0848  WBC 24.8* 20.5*  NEUTROABS 22.8*  --   HGB 15.1* 14.5  HCT 45.9 45.6  MCV 89.8 94.4  PLT 239 027   Basic Metabolic Panel: Recent Labs  Lab  06/23/18 1719 06/24/18 0848  NA 133* 136  K 2.6* 3.6  CL 95* 103  CO2 25 22  GLUCOSE 130* 101*  BUN 29* 32*  CREATININE 0.88 1.01*  CALCIUM 9.2 9.0  MG 1.7 2.3   GFR: Estimated Creatinine Clearance: 40.5 mL/min (A) (by C-G formula based on SCr of 1.01 mg/dL (H)). Liver Function Tests: Recent Labs  Lab 06/23/18 1719  AST 29  ALT 16  ALKPHOS 58  BILITOT 1.7*  PROT 7.6  ALBUMIN 4.0   No results for input(s): LIPASE, AMYLASE in the last 168 hours. No results for input(s): AMMONIA in the last 168 hours. Coagulation Profile: No results for input(s): INR, PROTIME in the last 168 hours. Cardiac Enzymes: No results for input(s): CKTOTAL, CKMB, CKMBINDEX, TROPONINI in the last 168 hours. BNP (last 3 results) No results for input(s): PROBNP in the last 8760 hours. HbA1C: No results for input(s): HGBA1C in the last 72 hours. CBG: No results for input(s): GLUCAP in the last 168 hours. Lipid Profile: No results for input(s): CHOL, HDL, LDLCALC, TRIG, CHOLHDL, LDLDIRECT in the last 72 hours. Thyroid Function Tests: No results for input(s): TSH, T4TOTAL, FREET4, T3FREE, THYROIDAB in the last 72 hours. Anemia Panel: No results for input(s): VITAMINB12, FOLATE, FERRITIN, TIBC, IRON, RETICCTPCT in the last 72 hours. Urine analysis:    Component Value Date/Time   COLORURINE YELLOW 06/23/2018 1719   APPEARANCEUR CLOUDY (A) 06/23/2018 1719   LABSPEC >1.030 (H) 06/23/2018 1719   PHURINE 5.5 06/23/2018 1719   GLUCOSEU NEGATIVE 06/23/2018 1719   HGBUR SMALL (A) 06/23/2018 1719   BILIRUBINUR NEGATIVE 06/23/2018 1719   KETONESUR NEGATIVE 06/23/2018 1719   PROTEINUR NEGATIVE 06/23/2018 1719   UROBILINOGEN 0.2 01/08/2007 2322   NITRITE POSITIVE (A) 06/23/2018 1719   LEUKOCYTESUR MODERATE (A) 06/23/2018 1719   Sepsis Labs: @LABRCNTIP (procalcitonin:4,lacticidven:4)  ) Recent Results (from the past 240 hour(s))  Blood culture (routine x 2)     Status: None (Preliminary result)    Collection Time: 06/23/18  5:20 PM  Result Value Ref Range Status   Specimen Description   Final    BLOOD BLOOD LEFT FOREARM Performed at [facility], [address] [place], [place] [address]    Special Requests   Final    BOTTLES DRAWN AEROBIC AND ANAEROBIC Blood Culture adequate volume Performed at [facility], [address] [place], [place] [address]    Culture   Final    NO GROWTH < 12 HOURS Performed at [facility], [place] [address] [place], [place] [address]    Report Status PENDING  Incomplete  Blood culture (routine x 2)     Status: None (Preliminary result)   Collection Time: 06/23/18  6:10 PM  Result Value Ref Range Status   Specimen Description   Final    BLOOD BLOOD RIGHT ARM Performed at [facility], [address] [place], [place] [address]    Special Requests   Final    BOTTLES DRAWN AEROBIC AND ANAEROBIC Blood Culture adequate volume Performed at [facility],  [place], [place] [address]    Culture   Final    NO GROWTH < 12 HOURS Performed at [place] [address] [place], [place] [address]    Report Status PENDING  Incomplete  SARS Coronavirus 2 [facility] order, Performed in [facility] hospital lab)     Status: None   Collection Time: 06/23/18  7:14 PM  Result Value Ref Range Status   SARS Coronavirus 2 NEGATIVE NEGATIVE Final    Comment: (NOTE) If result is NEGATIVE SARS-CoV-2 target nucleic acids are NOT DETECTED. The SARS-CoV-2 RNA is generally detectable in upper and lower  respiratory specimens during the acute phase of infection. The lowest  concentration of SARS-CoV-2 viral copies this assay can detect is 250  copies / mL. A negative result does not preclude SARS-CoV-2 infection  and should not be used as the sole basis for treatment or other  patient management decisions.  A negative result may occur with  improper specimen collection / handling, submission of  specimen other  than nasopharyngeal swab, presence of viral mutation(s) within the  areas targeted by this assay, and inadequate number of viral copies  (<250 copies / mL). A negative result must be combined with clinical  observations, patient history, and epidemiological information. If result is POSITIVE SARS-CoV-2 target nucleic acids are DETECTED. The SARS-CoV-2 RNA is generally detectable in upper and lower  respiratory specimens dur ing the acute phase of infection.  Positive  results are indicative of active infection with SARS-CoV-2.  Clinical  correlation with patient history and other diagnostic information is  necessary to determine patient infection status.  Positive results do  not rule out bacterial infection or co-infection with other viruses. If result is PRESUMPTIVE POSTIVE SARS-CoV-2 nucleic acids MAY BE PRESENT.   A presumptive positive result was obtained on the submitted specimen  and confirmed on repeat testing.  While 2019 novel coronavirus  (SARS-CoV-2) nucleic acids may be present in the submitted sample  additional confirmatory testing may be necessary for epidemiological  and / or clinical management purposes  to differentiate between  SARS-CoV-2 and other Sarbecovirus currently known to infect humans.  If clinically indicated additional testing with an alternate test  methodology [phone]) is advised. The SARS-CoV-2 RNA is generally  detectable in upper and lower respiratory sp ecimens during the acute  phase of infection. The expected result is Negative. Fact Sheet for Patients:  StrictlyIdeas.no Fact Sheet for Healthcare Providers: BankingDealers.co.za This test is not yet approved or cleared by the [place] FDA and has been authorized for detection and/or diagnosis of SARS-CoV-2 by FDA under an Emergency Use Authorization (EUA).  This EUA will remain in effect (meaning this test can be used) for the  duration of the COVID-19 declaration under Section 564(b)(1) of the Act, 21 U.S.C. section 360bbb-3(b)(1), unless the authorization is terminated or revoked sooner. Performed at [facility], [place] [address] [place], [place] [address]   MRSA PCR Screening     Status: None   Collection Time: 06/24/18  2:11 AM  Result Value Ref Range Status   MRSA by PCR NEGATIVE NEGATIVE Final    Comment:        The GeneXpert MRSA Assay (FDA approved for NASAL specimens only), is one component of a comprehensive MRSA colonization surveillance program. It is not intended to diagnose MRSA infection nor to guide or monitor treatment for MRSA infections. Performed at [facility], [address]  [place], [place] [address]       Radiology Studies: Ct Abdomen Pelvis W Contrast  Result Date: 06/23/2018 CLINICAL DATA:  85 year old female with lower abdominal pain EXAM: CT ABDOMEN AND PELVIS WITH CONTRAST TECHNIQUE: Multidetector CT imaging of the abdomen and pelvis was performed using the standard protocol following bolus administration of intravenous contrast. CONTRAST:  169mL OMNIPAQUE IOHEXOL 300 MG/ML  SOLN COMPARISON:  None. FINDINGS: Lower chest: No acute finding of the lower chest. Atelectasis/scarring. Hepatobiliary: Unremarkable liver. Cholelithiasis. No associated inflammatory changes. Pancreas: Punctate calcifications of pancreas with no inflammatory changes. Spleen: Unremarkable Adrenals/Urinary Tract: Unremarkable adrenal glands. No hydronephrosis or nephrolithiasis. Unremarkable course the bilateral ureters. Unremarkable urinary bladder. Stomach/Bowel: Unremarkable stomach. Small bowel decompressed without abnormal distention. No focal wall thickening of small bowel. Inflammatory changes of the appendix which is dilated, fluid-filled, with fecalith at the base. No evidence of perforation or abscess. Extensive diverticular disease without evidence of acute diverticulitis.  Vascular/Lymphatic: Atherosclerosis.  No adenopathy. Reproductive: Fibroid uterus with internal calcifications, otherwise unremarkable. Pessary in place. Other: Reactive fluid within the dependent pelvis.  No hernia. Musculoskeletal: Compression fracture of T12 is unchanged from previous CT. No acute displaced fracture. Multilevel degenerative changes. IMPRESSION: Acute uncomplicated appendicitis. These results were discussed by telephone at the time of interpretation on 06/23/2018 at 8:55 pm with Dr. Gareth Morgan. Reactive free fluid within the dependent pelvis. Diverticular disease without evidence of acute diverticulitis. Cholelithiasis. Aortic Atherosclerosis (ICD10-I70.0). Ancillary findings as above. Electronically Signed   By: Corrie Mckusick D.O.   On: 06/23/2018 20:56   Dg Chest Portable 1 View  Result Date: 06/23/2018 CLINICAL DATA:  Fever. Hypoxia EXAM: PORTABLE CHEST 1 VIEW COMPARISON:  06/12/2016 FINDINGS: Stable mild cardiomegaly. Aortic atherosclerosis. Both lungs are clear. IMPRESSION: Stable mild cardiomegaly. No active lung disease. Electronically Signed   By: Earle Gell M.D.   On: 06/23/2018 18:24     Scheduled Meds: . [MAR Hold] amLODipine  2.5 mg Oral Daily  . [MAR Hold] atenolol  50 mg Oral Daily   And  . [MAR Hold] chlorthalidone  25 mg Oral Daily  . Chlorhexidine Gluconate Cloth  6 each Topical Once   And  . Chlorhexidine Gluconate Cloth  6 each Topical Once  . [MAR Hold] citalopram  20 mg Oral Daily  . [MAR Hold] rosuvastatin  5 mg Oral Once per day on Mon Wed Fri   Continuous Infusions: . lactated ringers 100 mL/hr at 06/24/18 1132  . [MAR Hold] piperacillin-tazobactam 3.375 g (06/24/18 0852)     LOS: 1 day   Time Spent in minutes   30 minutes  Ahmira Boisselle D.O. on 06/24/2018 at 1:25 PM  Between 7am to 7pm - Please see pager noted on [email or web address]  After 7pm go to [email or web address]  And look for the night coverage person covering for me after hours  [facility] Office  [phone]

## 2018-06-25 ENCOUNTER — Encounter (HOSPITAL_COMMUNITY): Payer: Self-pay | Admitting: Surgery

## 2018-06-25 DIAGNOSIS — K56600 Partial intestinal obstruction, unspecified as to cause: Secondary | ICD-10-CM

## 2018-06-25 DIAGNOSIS — I10 Essential (primary) hypertension: Secondary | ICD-10-CM

## 2018-06-25 DIAGNOSIS — K35891 Other acute appendicitis without perforation, with gangrene: Secondary | ICD-10-CM

## 2018-06-25 DIAGNOSIS — E876 Hypokalemia: Secondary | ICD-10-CM

## 2018-06-25 DIAGNOSIS — K430 Incisional hernia with obstruction, without gangrene: Secondary | ICD-10-CM

## 2018-06-25 LAB — CBC
HCT: 39.5 % (ref 36.0–46.0)
Hemoglobin: 12.7 g/dL (ref 12.0–15.0)
MCH: 30 pg (ref 26.0–34.0)
MCHC: 32.2 g/dL (ref 30.0–36.0)
MCV: 93.4 fL (ref 80.0–100.0)
Platelets: 193 10*3/uL (ref 150–400)
RBC: 4.23 MIL/uL (ref 3.87–5.11)
RDW: 14.7 % (ref 11.5–15.5)
WBC: 12.9 10*3/uL — ABNORMAL HIGH (ref 4.0–10.5)
nRBC: 0 % (ref 0.0–0.2)

## 2018-06-25 LAB — BASIC METABOLIC PANEL
Anion gap: 11 (ref 5–15)
BUN: 30 mg/dL — ABNORMAL HIGH (ref 8–23)
CO2: 24 mmol/L (ref 22–32)
Calcium: 8.6 mg/dL — ABNORMAL LOW (ref 8.9–10.3)
Chloride: 102 mmol/L (ref 98–111)
Creatinine, Ser: 1.02 mg/dL — ABNORMAL HIGH (ref 0.44–1.00)
GFR calc Af Amer: 58 mL/min — ABNORMAL LOW (ref >60)
GFR calc non Af Amer: 50 mL/min — ABNORMAL LOW (ref >60)
Glucose, Bld: 100 mg/dL — ABNORMAL HIGH (ref 70–99)
Potassium: 2.8 mmol/L — ABNORMAL LOW (ref 3.5–5.1)
Sodium: 137 mmol/L (ref 135–145)

## 2018-06-25 LAB — MAGNESIUM: Magnesium: 2.1 mg/dL (ref 1.7–2.4)

## 2018-06-25 MED ORDER — POTASSIUM CHLORIDE 10 MEQ/100ML IV SOLN
10.0000 meq | INTRAVENOUS | Status: AC
  Administered 2018-06-25 (×6): 10 meq via INTRAVENOUS
  Filled 2018-06-25 (×6): qty 100

## 2018-06-25 NOTE — Progress Notes (Signed)
PROGRESS NOTE    Latoya Curry  ZSW:[id number] DOB: [birth year] DOA: 06/23/2018 PCP: Josetta Huddle, MD   Brief Narrative:  HPI on 06/24/2018 Latoya Curry is a 85 y.o. female with medical history significant for hypertension, hyperlipidemia, ascending thoracic aortic aneurysm, hyperthyroidism s/p RAI therapy, and anxiety who presented to [facility] with 2 days of lower abdominal pain, nausea without emesis, and intermittent fevers.  Her symptoms persisted and she had a telemetry visit with her PCP who recommended she be evaluated in the ED due to a prior history of diverticulitis.  Patient otherwise denies any diaphoresis, chest pain, palpitations, dyspnea, diarrhea, dysuria or urinary frequency.  Interim history Patient admitted for acute appendicitis.  Status post surgery on 06/24/2018.  Currently has NG tube in place, pending further recommendations from general surgery.  Assessment & Plan   Sepsis secondary to Acute appendicitis vs UTI -Sepsis present on admission, as patient presented with fever, tachypnea, leukocytosis -Urine was cloudy in appearance, many bacteria, positive nitrites, moderate leukocytes,>50WBC (however patient states that she has not had any burning or pain with urination) -CT abdomen pelvis showed acute uncomplicated appendicitis -Urine culture >100k GNR -Blood culture showed no growth to date -Continue Zosyn -Surgery consulted and appreciated, status post laparoscopic lysis of adhesions, laparoscopic appendectomy, primary incisional hernia repair.  Found to have a gangrenous appendicitis on partial small bowel obstruction.  NG tube was placed.  General surgery feels that patient is at risk for postoperative ileus and will likely need to be hospitalized for several days, continue antibiotics for 5 days.  Hypokalemia -Potassium 2.8 this morning, will continue to replace with IV supplementation -Magnesium 2.1 -Continue to monitor BMP  Asymptomatic  bacteriuria -See discussion as listed above -Urine culture >100k GNR -Continue Zosyn  Essential hypertension -Blood pressure stable -will hold amlodipine, atenolol given that patient is currently n.p.o. -Placed on IV hydralazine as needed  Ascending thoracic aortic aneurysm -Patient follows with cardiothoracic surgery, Dr. Prescott Gum.  Stable, 5.4 cm. -Continue medical therapy with blood pressure control  Hyperthyroidism -Underwent radioactive iodine therapy in January 2020 and is currently followed by her PCP as an outpatient.  Currently not on any type of replacement therapy.  Anxiety/depression -Continue Celexa  Hyperlipidemia -Continue statin  DVT Prophylaxis  SCDs  Code Status: Full  Family Communication: None at bedside  Disposition Plan: Admitted. Patient at risk for post-op ileus. Will likely remain hospitalized for several days. Dispo TBD.  Consultants General surgery  Procedures  laparoscopic lysis of adhesions, laparoscopic appendectomy, primary incisional hernia repair  Antibiotics   Anti-infectives (From admission, onward)   Start     Dose/Rate Route Frequency Ordered Stop   06/24/18 1800  piperacillin-tazobactam (ZOSYN) IVPB 3.375 g     3.375 g 12.5 mL/hr over 240 Minutes Intravenous Every 8 hours 06/24/18 1603 06/29/18 1759   06/24/18 1115  cefoTEtan (CEFOTAN) 2 g in sodium chloride 0.9 % 100 mL IVPB     2 g 200 mL/hr over 30 Minutes Intravenous On call to O.R. 06/24/18 1102 06/24/18 1237   06/24/18 0200  piperacillin-tazobactam (ZOSYN) IVPB 3.375 g  Status:  Discontinued     3.375 g 12.5 mL/hr over 240 Minutes Intravenous Every 8 hours 06/24/18 0110 06/24/18 1603   06/23/18 1730  piperacillin-tazobactam (ZOSYN) IVPB 3.375 g     3.375 g 100 mL/hr over 30 Minutes Intravenous  Once 06/23/18 1715 06/24/18 1133      Subjective:   Latoya Curry seen and examined today.  Patient feels sore this morning.  Denies current chest pain, shortness of breath,  nausea, dizziness or headache.  Does have some abdominal pain.  Objective:   Vitals:   06/24/18 2054 06/25/18 0111 06/25/18 0446 06/25/18 0500  BP: 115/76 119/63 119/82   Pulse: 92 93 (!) 105   Resp: 15 16 16    Temp: (!) 97.5 F (36.4 C) 98.4 F (36.9 C) 98.2 F (36.8 C)   TempSrc: Oral Oral Oral   SpO2: 99% 98% 96%   Weight:    72.5 kg  Height:        Intake/Output Summary (Last 24 hours) at 06/25/2018 1011 Last data filed at 06/25/2018 0900 Gross per 24 hour  Intake 2443.07 ml  Output 2765 ml  Net -321.93 ml   Filed Weights   06/24/18 0630 06/24/18 2030 06/25/18 0500  Weight: 71.5 kg 71.5 kg 72.5 kg    Exam  General: Well developed, well nourished, elderly, NAD, appears stated age  39: NCAT, mucous membranes moist. NG tube  Neck: Supple  Cardiovascular: S1 S2 auscultated, RRR, no murmur  Respiratory: Clear to auscultation bilaterally   Abdomen: Soft, nontender, nondistended, + bowel sounds  Extremities: warm dry without cyanosis clubbing or edema  Neuro: AAOx3, hard of hearing, otherwise nonfocal  Psych: Appropriate mood and affect   Data Reviewed: I have personally reviewed following labs and imaging studies  CBC: Recent Labs  Lab 06/23/18 1719 06/24/18 0848 06/25/18 0327  WBC 24.8* 20.5* 12.9*  NEUTROABS 22.8*  --   --   HGB 15.1* 14.5 12.7  HCT 45.9 45.6 39.5  MCV 89.8 94.4 93.4  PLT 239 230 735   Basic Metabolic Panel: Recent Labs  Lab 06/23/18 1719 06/24/18 0848 06/25/18 0327  NA 133* 136 137  K 2.6* 3.6 2.8*  CL 95* 103 102  CO2 25 22 24   GLUCOSE 130* 101* 100*  BUN 29* 32* 30*  CREATININE 0.88 1.01* 1.02*  CALCIUM 9.2 9.0 8.6*  MG 1.7 2.3 2.1   GFR: Estimated Creatinine Clearance: 40.4 mL/min (A) (by C-G formula based on SCr of 1.02 mg/dL (H)). Liver Function Tests: Recent Labs  Lab 06/23/18 1719  AST 29  ALT 16  ALKPHOS 58  BILITOT 1.7*  PROT 7.6  ALBUMIN 4.0   No results for input(s): LIPASE, AMYLASE in the last  168 hours. No results for input(s): AMMONIA in the last 168 hours. Coagulation Profile: No results for input(s): INR, PROTIME in the last 168 hours. Cardiac Enzymes: No results for input(s): CKTOTAL, CKMB, CKMBINDEX, TROPONINI in the last 168 hours. BNP (last 3 results) No results for input(s): PROBNP in the last 8760 hours. HbA1C: No results for input(s): HGBA1C in the last 72 hours. CBG: No results for input(s): GLUCAP in the last 168 hours. Lipid Profile: No results for input(s): CHOL, HDL, LDLCALC, TRIG, CHOLHDL, LDLDIRECT in the last 72 hours. Thyroid Function Tests: No results for input(s): TSH, T4TOTAL, FREET4, T3FREE, THYROIDAB in the last 72 hours. Anemia Panel: No results for input(s): VITAMINB12, FOLATE, FERRITIN, TIBC, IRON, RETICCTPCT in the last 72 hours. Urine analysis:    Component Value Date/Time   COLORURINE YELLOW 06/23/2018 1719   APPEARANCEUR CLOUDY (A) 06/23/2018 1719   LABSPEC >1.030 (H) 06/23/2018 1719   PHURINE 5.5 06/23/2018 1719   GLUCOSEU NEGATIVE 06/23/2018 1719   HGBUR SMALL (A) 06/23/2018 1719   BILIRUBINUR NEGATIVE 06/23/2018 1719   KETONESUR NEGATIVE 06/23/2018 1719   PROTEINUR NEGATIVE 06/23/2018 1719   UROBILINOGEN 0.2 01/08/2007 2322  NITRITE POSITIVE (A) 06/23/2018 1719   LEUKOCYTESUR MODERATE (A) 06/23/2018 1719   Sepsis Labs: @LABRCNTIP (procalcitonin:4,lacticidven:4)  ) Recent Results (from the past 240 hour(s))  Urine culture     Status: Abnormal (Preliminary result)   Collection Time: 06/23/18  5:19 PM  Result Value Ref Range Status   Specimen Description   Final    URINE, RANDOM Performed at [facility], [address] [place], [place] [address]    Special Requests   Final    NONE Performed at [facility], [address] [place], [place] [address]    Culture >=100,000 COLONIES/mL GRAM NEGATIVE RODS (A)  Final   Report Status PENDING  Incomplete  Blood culture (routine x 2)     Status: None  (Preliminary result)   Collection Time: 06/23/18  5:20 PM  Result Value Ref Range Status   Specimen Description   Final    BLOOD BLOOD LEFT FOREARM Performed at [facility], [address] [place], [place] [address]    Special Requests   Final    BOTTLES DRAWN AEROBIC AND ANAEROBIC Blood Culture adequate volume Performed at [facility], [address] [place], [place] [address]    Culture   Final    NO GROWTH < 12 HOURS Performed at [facility], [place] [address], [place], [place] [address]    Report Status PENDING  Incomplete  Blood culture (routine x 2)     Status: None (Preliminary result)   Collection Time: 06/23/18  6:10 PM  Result Value Ref Range Status   Specimen Description   Final    BLOOD BLOOD RIGHT ARM Performed at [facility], [address] [place], [place] [address]    Special Requests   Final    BOTTLES DRAWN AEROBIC AND ANAEROBIC Blood Culture adequate volume Performed at [facility], [address] [place], [place] [address]    Culture   Final    NO GROWTH < 12 HOURS Performed at [facility], [place] [address], [place], [place] [address]    Report Status PENDING  Incomplete  SARS Coronavirus 2 [facility] order, Performed in [place] [facility])     Status: None   Collection Time: 06/23/18  7:14 PM  Result Value Ref Range Status   SARS Coronavirus 2 NEGATIVE NEGATIVE Final    Comment: (NOTE) If result is NEGATIVE SARS-CoV-2 target nucleic acids are NOT DETECTED. The SARS-CoV-2 RNA is generally detectable in upper and lower  respiratory specimens during the acute phase of infection. The lowest  concentration of SARS-CoV-2 viral copies this assay can detect is 250  copies / mL. A negative result does not preclude SARS-CoV-2 infection  and should not be used as the sole basis for treatment or other  patient management decisions.  A negative result may occur with  improper specimen collection /  handling, submission of specimen other  than nasopharyngeal swab, presence of viral mutation(s) within the  areas targeted by this assay, and inadequate number of viral copies  (<250 copies / mL). A negative result must be combined with clinical  observations, patient history, and epidemiological information. If result is POSITIVE SARS-CoV-2 target nucleic acids are DETECTED. The SARS-CoV-2 RNA is generally detectable in upper and lower  respiratory specimens dur ing the acute phase of infection.  Positive  results are indicative of active infection with SARS-CoV-2.  Clinical  correlation with  patient history and other diagnostic information is  necessary to determine patient infection status.  Positive results do  not rule out bacterial infection or co-infection with other viruses. If result is PRESUMPTIVE POSTIVE SARS-CoV-2 nucleic acids MAY BE PRESENT.   A presumptive positive result was obtained on the submitted specimen  and confirmed on repeat testing.  While 2019 novel coronavirus  (SARS-CoV-2) nucleic acids may be present in the submitted sample  additional confirmatory testing may be necessary for epidemiological  and / or clinical management purposes  to differentiate between  SARS-CoV-2 and other Sarbecovirus currently known to infect humans.  If clinically indicated additional testing with an alternate test  methodology [phone]) is advised. The SARS-CoV-2 RNA is generally  detectable in upper and lower respiratory sp ecimens during the acute  phase of infection. The expected result is Negative. Fact Sheet for Patients:  StrictlyIdeas.no Fact Sheet for Healthcare Providers: BankingDealers.co.za This test is not yet approved or cleared by the [place] FDA and has been authorized for detection and/or diagnosis of SARS-CoV-2 by FDA under an Emergency Use Authorization (EUA).  This EUA will remain in effect (meaning this  test can be used) for the duration of the COVID-19 declaration under Section 564(b)(1) of the Act, 21 U.S.C. section 360bbb-3(b)(1), unless the authorization is terminated or revoked sooner. Performed at [facility], [place] [address], [place], [place] [address]   MRSA PCR Screening     Status: None   Collection Time: 06/24/18  2:11 AM  Result Value Ref Range Status   MRSA by PCR NEGATIVE NEGATIVE Final    Comment:        The GeneXpert MRSA Assay (FDA approved for NASAL specimens only), is one component of a comprehensive MRSA colonization surveillance program. It is not intended to diagnose MRSA infection nor to guide or monitor treatment for MRSA infections. Performed at [facility], [place] [address], [place], [place] [address]       Radiology Studies: Ct Abdomen Pelvis W Contrast  Result Date: 06/23/2018 CLINICAL DATA:  85 year old female with lower abdominal pain EXAM: CT ABDOMEN AND PELVIS WITH CONTRAST TECHNIQUE: Multidetector CT imaging of the abdomen and pelvis was performed using the standard protocol following bolus administration of intravenous contrast. CONTRAST:  134mL OMNIPAQUE IOHEXOL 300 MG/ML  SOLN COMPARISON:  None. FINDINGS: Lower chest: No acute finding of the lower chest. Atelectasis/scarring. Hepatobiliary: Unremarkable liver. Cholelithiasis. No associated inflammatory changes. Pancreas: Punctate calcifications of pancreas with no inflammatory changes. Spleen: Unremarkable Adrenals/Urinary Tract: Unremarkable adrenal glands. No hydronephrosis or nephrolithiasis. Unremarkable course the bilateral ureters. Unremarkable urinary bladder. Stomach/Bowel: Unremarkable stomach. Small bowel decompressed without abnormal distention. No focal wall thickening of small bowel. Inflammatory changes of the appendix which is dilated, fluid-filled, with fecalith at the base. No evidence of perforation or abscess. Extensive diverticular disease without evidence  of acute diverticulitis. Vascular/Lymphatic: Atherosclerosis.  No adenopathy. Reproductive: Fibroid uterus with internal calcifications, otherwise unremarkable. Pessary in place. Other: Reactive fluid within the dependent pelvis.  No hernia. Musculoskeletal: Compression fracture of T12 is unchanged from previous CT. No acute displaced fracture. Multilevel degenerative changes. IMPRESSION: Acute uncomplicated appendicitis. These results were discussed by telephone at the time of interpretation on 06/23/2018 at 8:55 pm with Dr. Gareth Morgan. Reactive free fluid within the dependent pelvis. Diverticular disease without evidence of acute diverticulitis. Cholelithiasis. Aortic Atherosclerosis (ICD10-I70.0). Ancillary findings as above. Electronically Signed   By: Corrie Mckusick D.O.   On: 06/23/2018 20:56   Dg Chest Portable  1 View  Result Date: 06/23/2018 CLINICAL DATA:  Fever. Hypoxia EXAM: PORTABLE CHEST 1 VIEW COMPARISON:  06/12/2016 FINDINGS: Stable mild cardiomegaly. Aortic atherosclerosis. Both lungs are clear. IMPRESSION: Stable mild cardiomegaly. No active lung disease. Electronically Signed   By: Earle Gell M.D.   On: 06/23/2018 18:24     Scheduled Meds: . amLODipine  2.5 mg Oral Daily  . atenolol  50 mg Oral Daily   And  . chlorthalidone  25 mg Oral Daily  . citalopram  20 mg Oral Daily  . lip balm  1 application Topical BID  . rosuvastatin  5 mg Oral Once per day on Mon Wed Fri   Continuous Infusions: . albumin human    . lactated ringers 100 mL/hr at 06/24/18 1132  . methocarbamol (ROBAXIN) IV    . ondansetron (ZOFRAN) IV    . piperacillin-tazobactam 3.375 g (06/25/18 0947)  . potassium chloride 10 mEq (06/25/18 0830)     LOS: 2 days   Time Spent in minutes   30 minutes  Latoya Curry D.O. on 06/25/2018 at 10:11 AM  Between 7am to 7pm - Please see pager noted on [email or web address]  After 7pm go to [email or web address]  And look for the night coverage person covering for me after hours   [facility] Office  [phone]

## 2018-06-25 NOTE — Progress Notes (Signed)
1 Day Post-Op    CC: abdominal pain  Subjective: Patient is still having a fair amount of discomfort.  No flatus no bowel sounds.  Pretty sore and tender.  Drain shows serosanguineous, still on the bloody side.  Port sites look fine.  Objective: Vital signs in last 24 hours: Temp:  [97.5 F (36.4 C)-98.5 F (36.9 C)] 98.2 F (36.8 C) (05/01 0446) Pulse Rate:  [29-105] 105 (05/01 0446) Resp:  [15-22] 16 (05/01 0446) BP: (91-127)/(52-82) 119/82 (05/01 0446) SpO2:  [92 %-100 %] 96 % (05/01 0446) Weight:  [71.5 kg-72.5 kg] 72.5 kg (05/01 0500)   N.p.o. 2400 IV 1800 urine 500 OG/NG 520 drain Afebrile vital signs are stable Potassium 2.8 creatinine 1.2 WBC 12.9 Hemoglobin 12.7/hematocrit 39.5  Intake/Output from previous day: 04/30 0701 - 05/01 0700 In: 2443.1 [I.V.:2212; IV Piggyback:231.1] Out: 6045 [Urine:1800; Emesis/NG output:500; Drains:520; Blood:25] Intake/Output this shift: Total I/O In: -  Out: 420 [Emesis/NG output:400; Drains:20]  General appearance: alert, cooperative and no distress Resp: clear to auscultation bilaterally GI: Soft, very tender, port sites look fine.  No flatus or BM, no bowel sounds.  Lab Results:  Recent Labs    06/24/18 0848 06/25/18 0327  WBC 20.5* 12.9*  HGB 14.5 12.7  HCT 45.6 39.5  PLT 230 193    BMET Recent Labs    06/24/18 0848 06/25/18 0327  NA 136 137  K 3.6 2.8*  CL 103 102  CO2 22 24  GLUCOSE 101* 100*  BUN 32* 30*  CREATININE 1.01* 1.02*  CALCIUM 9.0 8.6*   PT/INR No results for input(s): LABPROT, INR in the last 72 hours.  Recent Labs  Lab 06/23/18 1719  AST 29  ALT 16  ALKPHOS 58  BILITOT 1.7*  PROT 7.6  ALBUMIN 4.0     Lipase  No results found for: LIPASE   Prior to Admission medications   Medication Sig Start Date End Date Taking? Authorizing Provider  amLODipine (NORVASC) 2.5 MG tablet Take 2.5 mg by mouth daily.   Yes [provider]  aspirin 81 MG tablet Take 81 mg by mouth See  admin instructions. Monday, Wednesday and Friday   Yes [provider]  atenolol-chlorthalidone (TENORETIC) 50-25 MG per tablet Take 1 tablet by mouth daily.   Yes [provider]  cetirizine (ZYRTEC) 10 MG tablet Take 10 mg by mouth daily.   Yes [provider]  cholecalciferol (VITAMIN D) 1000 UNITS tablet Take 1,000 Units by mouth daily.   Yes [provider]  citalopram (CELEXA) 20 MG tablet Take 20 mg by mouth daily.   Yes [provider]  omeprazole (PRILOSEC) 20 MG capsule Take 30- 60 min before your first and last meals of the day 09/23/11  Yes Tanda Rockers, MD  potassium chloride (K-DUR) 10 MEQ tablet Take 10 mEq by mouth daily. 03/17/18  Yes [provider]  rosuvastatin (CRESTOR) 10 MG tablet Take 5 mg by mouth See admin instructions. Monday, Wednesday and Friday   Yes [provider]  TOVIAZ 8 MG TB24 tablet Take 8 mg by mouth daily. 02/25/18  Yes [provider]   Medications: . amLODipine  2.5 mg Oral Daily  . atenolol  50 mg Oral Daily   And  . chlorthalidone  25 mg Oral Daily  . citalopram  20 mg Oral Daily  . lip balm  1 application Topical BID  . rosuvastatin  5 mg Oral Once per day on Mon Wed Fri    .  albumin human    . lactated ringers 100 mL/hr at 06/24/18 1132  . methocarbamol (ROBAXIN) IV    . ondansetron (ZOFRAN) IV    . piperacillin-tazobactam 3.375 g (06/25/18 0947)  . potassium chloride 10 mEq (06/25/18 1035)   Assessment/Plan Abdominal thoracic aortic aneurysm Hypothyroidism Anxiety Hyperlipidemia Hypokalemia -potassium being replaced Gangrenous appendicitis, pelvic access, partial small bowel obstruction due to adhesions, small supraumbilical incarcerated incisional hernia. Laparoscopic lysis of adhesions, laparoscopic appendectomy, primary incisional hernia repair, drain placement, 06/24/2018 Dr. Michael Boston  FEN: IV fluids/n.p.o. ID: Zosyn 4/29 >> day 3 DVT: SCDs only Follow-up:  Dr. Johney Maine   Plan: Continue NG, continue n.p.o. and IV fluid rehydration.  Continue IV antibiotics.  Start to mobilize out of bed to the chair.  She lives at an independent living facility will get OT and PT to start helping with mobilization tomorrow.   LOS: 2 days    Brennan Litzinger 06/25/2018 [phone]

## 2018-06-25 NOTE — Evaluation (Signed)
Physical Therapy Evaluation Patient Details Name: Latoya Curry MRN: [id number] DOB: 1930/08/24 Today's Date: 06/25/2018   History of Present Illness  85 yo female admitted to ED on 4/29 with abdominal pain and fever. Covid -. Pt s/p lap appendectomy, incisional hernia repair, and lysis of adhesions 4/30. PMH includes LBP, diverticulitis, DJD, HTN, obesity, osteopenia, recurrent UTIs, transient global amnesia, bilateral TKR, ascending thoracic aortic aneurysm.   Clinical Impression   Pt presents with abdominal soreness, difficulty performing bed mobility post-surgery, increased time and effort to perform mobility tasks, and decreased activity tolerance. Pt to benefit from acute PT to address deficits. Pt ambulated hallway distance with RW with min guard assist, verbal cuing for upright posture and form provided throughout. At baseline, pt walks with no AD or cane, and pt reports she walks for exercise. PT recommending HHPT to return pt to PLOF. Pt plans to d/c home with one of her daughters. PT to progress mobility as tolerated, and will continue to follow acutely.      Follow Up Recommendations Home health PT;Supervision for mobility/OOB    Equipment Recommendations  Rolling walker with 5" wheels    Recommendations for Other Services       Precautions / Restrictions Precautions Precautions: Fall Precaution Comments: abdominal Restrictions Weight Bearing Restrictions: No      Mobility  Bed Mobility Overal bed mobility: Needs Assistance Bed Mobility: Rolling;Sidelying to Sit Rolling: Min assist Sidelying to sit: Mod assist       General bed mobility comments: Log rolling technique utilized due to pt abdominal surgery. Min assist for completion of roll onto side, mod assist for trunk elevation and LE management when moving into sitting. Pt able to scoot to EOB with increased time.   Transfers Overall transfer level: Needs assistance Equipment used: Rolling walker (2  wheeled) Transfers: Sit to/from Stand Sit to Stand: Min assist;From elevated surface         General transfer comment: Min assist for power up and steadying. Sit to stand x2, once from bed and once from [facility].   Ambulation/Gait Ambulation/Gait assistance: Min guard Gait Distance (Feet): 120 Feet Assistive device: Rolling walker (2 wheeled) Gait Pattern/deviations: Step-through pattern;Decreased stride length;Trunk flexed Gait velocity: decr    General Gait Details: Min guard for safety. Frequent verbal cuing for upright posture, pt with forward flexed trunk due to abdominal discomfort.   Stairs            Wheelchair Mobility    Modified Rankin (Stroke Patients Only)       Balance Overall balance assessment: Needs assistance Sitting-balance support: No upper extremity supported;Feet supported Sitting balance-Leahy Scale: Good     Standing balance support: Bilateral upper extremity supported Standing balance-Leahy Scale: Fair Standing balance comment: able to stand without UE support, requires RW for ambulation                              Pertinent Vitals/Pain Pain Assessment: Faces Faces Pain Scale: Hurts even more Pain Location: abdomen, with sit<>stand Pain Descriptors / Indicators: Sore Pain Intervention(s): Limited activity within patient's tolerance;Monitored during session;Repositioned    Home Living Family/patient expects to be discharged to:: Private residence(ILF) Living Arrangements: Alone Available Help at Discharge: Family;Available PRN/intermittently(Pt to d/c with one of her two daughters, below information refers to this) Type of Home: House Home Access: Level entry     Home Layout: Able to live on main level with bedroom/bathroom Home Equipment: Kasandra Knudsen -  single point      Prior Function Level of Independence: Independent with assistive device(s)         Comments: Pt reports occasionally using cane for ambulation, pt was  having meals delivered to room PTA due to COVID precautions.      Hand Dominance   Dominant Hand: Right    Extremity/Trunk Assessment   Upper Extremity Assessment Upper Extremity Assessment: Overall WFL for tasks assessed    Lower Extremity Assessment Lower Extremity Assessment: Generalized weakness    Cervical / Trunk Assessment Cervical / Trunk Assessment: Normal  Communication   Communication: HOH  Cognition Arousal/Alertness: Awake/alert Behavior During Therapy: WFL for tasks assessed/performed Overall Cognitive Status: Within Functional Limits for tasks assessed                                        General Comments      Exercises     Assessment/Plan    PT Assessment Patient needs continued PT services  PT Problem List Decreased strength;Decreased mobility;Decreased knowledge of precautions;Decreased balance;Decreased activity tolerance;Pain       PT Treatment Interventions DME instruction;Functional mobility training;Balance training;Patient/family education;Gait training;Therapeutic activities;Therapeutic exercise    PT Goals (Current goals can be found in the Care Plan section)  Acute Rehab PT Goals Patient Stated Goal: stop pain  PT Goal Formulation: With patient Time For Goal Achievement: 07/09/18 Potential to Achieve Goals: Good    Frequency Min 4X/week   Barriers to discharge        Co-evaluation               AM-PAC PT "6 Clicks" Mobility  Outcome Measure Help needed turning from your back to your side while in a flat bed without using bedrails?: A Little Help needed moving from lying on your back to sitting on the side of a flat bed without using bedrails?: A Little Help needed moving to and from a bed to a chair (including a wheelchair)?: A Little Help needed standing up from a chair using your arms (e.g., wheelchair or bedside chair)?: A Little Help needed to walk in hospital room?: A Little Help needed climbing  3-5 steps with a railing? : A Little 6 Click Score: 18    End of Session Equipment Utilized During Treatment: Gait belt;Other (comment)(RN clamped NG for mobility ) Activity Tolerance: Patient limited by pain Patient left: in chair;with call bell/phone within reach Nurse Communication: Mobility status PT Visit Diagnosis: Other abnormalities of gait and mobility (R26.89);Pain;Muscle weakness (generalized) (M62.81) Pain - Right/Left: Right Pain - part of body: (abdomen)    Time: 0254-2706 PT Time Calculation (min) (ACUTE ONLY): 24 min   Charges:   PT Evaluation $PT Eval Low Complexity: 1 Low PT Treatments $Gait Training: 8-22 mins        Julien Girt, PT Acute Rehabilitation Services Pager [phone]  Office [phone]  Moshe Wenger D Elonda Husky 06/25/2018, 4:51 PM

## 2018-06-25 NOTE — Progress Notes (Signed)
Pharmacy - positive Blood Cx  Assessment: 1/4 BCx bottles with GPR. Likely contaminant and already on Zosyn  Plan: notified Mid-level coverage X [place]; likely no action needed if no signs of worsening infxn  Reuel Boom, PharmD, BCPS [phone] 06/25/2018, 7:43 PM

## 2018-06-26 LAB — CBC
HCT: 37.1 % (ref 36.0–46.0)
Hemoglobin: 11.7 g/dL — ABNORMAL LOW (ref 12.0–15.0)
MCH: 29.4 pg (ref 26.0–34.0)
MCHC: 31.5 g/dL (ref 30.0–36.0)
MCV: 93.2 fL (ref 80.0–100.0)
Platelets: 225 10*3/uL (ref 150–400)
RBC: 3.98 MIL/uL (ref 3.87–5.11)
RDW: 14.4 % (ref 11.5–15.5)
WBC: 12.2 10*3/uL — ABNORMAL HIGH (ref 4.0–10.5)
nRBC: 0 % (ref 0.0–0.2)

## 2018-06-26 LAB — URINE CULTURE: Culture: >=100000 — AB

## 2018-06-26 LAB — BASIC METABOLIC PANEL
Anion gap: 10 (ref 5–15)
BUN: 24 mg/dL — ABNORMAL HIGH (ref 8–23)
CO2: 29 mmol/L (ref 22–32)
Calcium: 8.7 mg/dL — ABNORMAL LOW (ref 8.9–10.3)
Chloride: 98 mmol/L (ref 98–111)
Creatinine, Ser: 0.8 mg/dL (ref 0.44–1.00)
GFR calc Af Amer: >60 mL/min (ref >60)
GFR calc non Af Amer: >60 mL/min (ref >60)
Glucose, Bld: 82 mg/dL (ref 70–99)
Potassium: 3.6 mmol/L (ref 3.5–5.1)
Sodium: 137 mmol/L (ref 135–145)

## 2018-06-26 MED ORDER — HYDRALAZINE HCL 20 MG/ML IJ SOLN: 10.0000 mg | Freq: Four times a day (QID) | INTRAMUSCULAR | Status: DC | PRN

## 2018-06-26 MED ORDER — HYDRALAZINE HCL 20 MG/ML IJ SOLN: 5.0000 mg | INTRAMUSCULAR | Status: DC | PRN

## 2018-06-26 MED ORDER — METOPROLOL TARTRATE 5 MG/5ML IV SOLN
5.0000 mg | Freq: Four times a day (QID) | INTRAVENOUS | Status: DC | PRN
  Administered 2018-06-29: 5 mg via INTRAVENOUS
  Filled 2018-06-26: qty 5

## 2018-06-26 MED ORDER — METRONIDAZOLE IN NACL 5-0.79 MG/ML-% IV SOLN
500.0000 mg | Freq: Three times a day (TID) | INTRAVENOUS | Status: DC
  Administered 2018-06-26 – 2018-06-30 (×12): 500 mg via INTRAVENOUS
  Filled 2018-06-26 (×12): qty 100

## 2018-06-26 MED ORDER — POTASSIUM CHLORIDE 10 MEQ/100ML IV SOLN
10.0000 meq | INTRAVENOUS | Status: AC
  Administered 2018-06-26 (×4): 10 meq via INTRAVENOUS
  Filled 2018-06-26 (×4): qty 100

## 2018-06-26 MED ORDER — SODIUM CHLORIDE 0.9 % IV SOLN
2.0000 g | INTRAVENOUS | Status: DC
  Administered 2018-06-26 – 2018-06-29 (×4): 2 g via INTRAVENOUS
  Filled 2018-06-26: qty 20
  Filled 2018-06-26: qty 2
  Filled 2018-06-26: qty 20
  Filled 2018-06-26 (×2): qty 2

## 2018-06-26 NOTE — Progress Notes (Signed)
     Assessment & Plan: POD#2 - Gangrenous appendicitis, pelvic access, partial small bowel obstruction due to adhesions, small supraumbilical incarcerated incisional hernia. Laparoscopic lysis of adhesions, laparoscopic appendectomy, primary incisional hernia repair, drain placement, 06/24/2018 Dr. Michael Boston   Continue NPO, NG decompression, IV hydration  Continue IV abx  OOB to chair, ambulate with assistance  Monitor drain output  WBC 12K        Armandina Gemma, MD       [facility]       Office: [phone]   Chief Complaint: Gangrenous appendicitis with abscess  Subjective: Patient up to chair, comfortable.  NG with bilious. Denies pain.  Denies flatus or BM.  Objective: Vital signs in last 24 hours: Temp:  [98.6 F (37 C)-99.9 F (37.7 C)] 99.9 F (37.7 C) (05/02 0533) Pulse Rate:  [95-99] 95 (05/02 0533) Resp:  [16-22] 20 (05/02 0533) BP: (133-135)/(73-79) 133/73 (05/02 0533) SpO2:  [90 %] 90 % (05/02 0533) Weight:  [72.3 kg] 72.3 kg (05/02 0533)    Intake/Output from previous day: 05/01 0701 - 05/02 0700 In: 1889.1 [P.O.:240; I.V.:1200; IV Piggyback:449.1] Out: 2880 [Urine:950; Emesis/NG output:1850; Drains:80] Intake/Output this shift: No intake/output data recorded.  Physical Exam: HEENT - sclerae clear, mucous membranes moist Neck - soft Chest - clear bilaterally Cor - RRR Abdomen - soft, wounds dry and intact; drain with serosanguinous Ext - no edema, non-tender Neuro - alert & oriented, no focal deficits  Lab Results:  Recent Labs    06/25/18 0327 06/26/18 0312  WBC 12.9* 12.2*  HGB 12.7 11.7*  HCT 39.5 37.1  PLT 193 225   BMET Recent Labs    06/25/18 0327 06/26/18 0312  NA 137 137  K 2.8* 3.6  CL 102 98  CO2 24 29  GLUCOSE 100* 82  BUN 30* 24*  CREATININE 1.02* 0.80  CALCIUM 8.6* 8.7*   PT/INR No results for input(s): LABPROT, INR in the last 72 hours. Comprehensive Metabolic Panel:    Component Value  Date/Time   NA 137 06/26/2018 0312   NA 137 06/25/2018 0327   NA 144 08/25/2017 0910   K 3.6 06/26/2018 0312   K 2.8 (L) 06/25/2018 0327   CL 98 06/26/2018 0312   CL 102 06/25/2018 0327   CO2 29 06/26/2018 0312   CO2 24 06/25/2018 0327   BUN 24 (H) 06/26/2018 0312   BUN 30 (H) 06/25/2018 0327   BUN 25 08/25/2017 0910   CREATININE 0.80 06/26/2018 0312   CREATININE 1.02 (H) 06/25/2018 0327   GLUCOSE 82 06/26/2018 0312   GLUCOSE 100 (H) 06/25/2018 0327   CALCIUM 8.7 (L) 06/26/2018 0312   CALCIUM 8.6 (L) 06/25/2018 0327   AST 29 06/23/2018 1719   AST 26 08/25/2017 0910   ALT 16 06/23/2018 1719   ALT 10 08/25/2017 0910   ALKPHOS 58 06/23/2018 1719   ALKPHOS 72 08/25/2017 0910   BILITOT 1.7 (H) 06/23/2018 1719   BILITOT 0.6 08/25/2017 0910   BILITOT 1.2 02/01/2015 1235   PROT 7.6 06/23/2018 1719   PROT 7.1 08/25/2017 0910   PROT 7.1 02/01/2015 1235   ALBUMIN 4.0 06/23/2018 1719   ALBUMIN 4.4 08/25/2017 0910   ALBUMIN 3.8 02/01/2015 1235    Studies/Results: No results found.    Armandina Gemma 06/26/2018  Patient ID: Latoya Curry, female   DOB: 08-07-1930, 85 y.o.   MRN: [id number]

## 2018-06-26 NOTE — Progress Notes (Signed)
Patient has history of stool incontinence, but not usually loose stool.  Hospitalist and CCS was  Notified to see if they wanted to removed NG, clamp or advance diet. Both agreed to Keep NG tube overnight and no other orders were given at this time.

## 2018-06-26 NOTE — Evaluation (Signed)
Occupational Therapy Evaluation Patient Details Name: Latoya Curry MRN: [id number] DOB: 04-21-1930 Today's Date: 06/26/2018    History of Present Illness 85 yo female admitted to ED on 4/29 with abdominal pain and fever. Covid -. Pt s/p lap appendectomy, incisional hernia repair, and lysis of adhesions 4/30. PMH includes LBP, diverticulitis, DJD, HTN, obesity, osteopenia, recurrent UTIs, transient global amnesia, bilateral TKR, ascending thoracic aortic aneurysm.    Clinical Impression   Pt admitted with above diagnoses, with pain and decreased activity tolerance limiting ability to engage in BADL at desired level of ind. Upon evaluation, pt shares she lives in [place] and manages ADL ind with IADL assistance from [place]. At d/c she plans to return home with dtr for intermittent supervision. Pt requiring min A for sit > stand from [facility] at time of evaluation. She is able to complete household functional mobility at min guard level with RW. Recommend pt needing 3:1 at d/c for shower chair and over toilet for inc ind t/f. Continued to educate pt on safe bathing and dressing with abdominal restrictions, pt in understanding. Pt will benefit from skilled OT to address activity tolerance and modification in BADL activity. Recommend HHOT at d/c to maximize ind baseline. Will continue to follow acutely.     Follow Up Recommendations  Home health OT;Supervision - Intermittent    Equipment Recommendations  3 in 1 bedside commode    Recommendations for Other Services       Precautions / Restrictions Precautions Precautions: Fall Precaution Comments: abdominal Restrictions Weight Bearing Restrictions: No      Mobility Bed Mobility Overal bed mobility: Needs Assistance Bed Mobility: Sit to Supine       Sit to supine: Mod assist   General bed mobility comments: pt up in chair on arrival; when back to bed pt needing mod A for BLE mgt and cueing/education for log rolling  technique  Transfers Overall transfer level: Needs assistance Equipment used: Rolling walker (2 wheeled) Transfers: Sit to/from Stand Sit to Stand: Min assist         General transfer comment: min A for power up and steadying, heavy reliance on external support to push to standing    Balance Overall balance assessment: Needs assistance Sitting-balance support: No upper extremity supported;Feet supported Sitting balance-Leahy Scale: Good     Standing balance support: Bilateral upper extremity supported;During functional activity Standing balance-Leahy Scale: Fair Standing balance comment: able to stand without UE support, requires RW for functional tasks                           ADL either performed or assessed with clinical judgement   ADL Overall ADL's : Needs assistance/impaired Eating/Feeding: Independent;Sitting   Grooming: Set up;Standing   Upper Body Bathing: Sitting;Set up   Lower Body Bathing: Minimal assistance;Sit to/from stand;Sitting/lateral leans   Upper Body Dressing : Set up;Sitting   Lower Body Dressing: Minimal assistance;Sit to/from stand;Sitting/lateral leans Lower Body Dressing Details (indicate cue type and reason): educated and demonstrated ways given recent sx, pt needing min A to sustain activity Toilet Transfer: Minimal assistance;BSC;RW Toilet Transfer Details (indicate cue type and reason): min A for power up to stand, heavy reliance on hand rails Toileting- Clothing Manipulation and Hygiene: Supervision/safety;Sitting/lateral lean   Tub/ Shower Transfer: Minimal assistance;Shower seat;Ambulation;Rolling walker   Functional mobility during ADLs: Min guard;Rolling walker General ADL Comments: pt ultimately needing min A for power to stand and bed mobility during ADLs. educated pt on  safe bathing, dressing, etc.     Vision Baseline Vision/History: Wears glasses Wears Glasses: At all times Patient Visual Report: No change from  baseline       Perception     Praxis      Pertinent Vitals/Pain Pain Assessment: Faces Faces Pain Scale: Hurts a little bit Pain Location: abdomen, with sit<>stand Pain Descriptors / Indicators: Sore Pain Intervention(s): Limited activity within patient's tolerance;Monitored during session;Repositioned     Hand Dominance Right   Extremity/Trunk Assessment Upper Extremity Assessment Upper Extremity Assessment: Overall WFL for tasks assessed   Lower Extremity Assessment Lower Extremity Assessment: Defer to PT evaluation       Communication Communication Communication: HOH   Cognition Arousal/Alertness: Awake/alert Behavior During Therapy: WFL for tasks assessed/performed Overall Cognitive Status: Within Functional Limits for tasks assessed                                     General Comments  JP drain, NG for suction    Exercises     Shoulder Instructions      Home Living Family/patient expects to be discharged to:: Private residence(ILF) Living Arrangements: Alone Available Help at Discharge: Family;Available PRN/intermittently(pt is to d/c home with dtr, home description reflects dtr home) Type of Home: House Home Access: Level entry     Home Layout: Able to live on main level with bedroom/bathroom     Bathroom Shower/Tub: Walk-in shower;Tub/shower unit   Bathroom Toilet: Standard     Home Equipment: Cane - single point          Prior Functioning/Environment Level of Independence: Independent with assistive device(s)        Comments: having IADL needs met per ILF        OT Problem List: Decreased strength;Decreased activity tolerance;Decreased knowledge of use of DME or AE;Impaired balance (sitting and/or standing);Pain      OT Treatment/Interventions: Self-care/ADL training;DME and/or AE instruction;Therapeutic activities;Balance training;Therapeutic exercise;Patient/family education    OT Goals(Current goals can be found  in the care plan section) Acute Rehab OT Goals Patient Stated Goal: to walk today OT Goal Formulation: With patient Time For Goal Achievement: 07/10/18 Potential to Achieve Goals: Good  OT Frequency: Min 2X/week   Barriers to D/C:            Co-evaluation              AM-PAC OT "6 Clicks" Daily Activity     Outcome Measure Help from another person eating meals?: None Help from another person taking care of personal grooming?: None Help from another person toileting, which includes using toliet, bedpan, or urinal?: A Little Help from another person bathing (including washing, rinsing, drying)?: A Little Help from another person to put on and taking off regular upper body clothing?: None Help from another person to put on and taking off regular lower body clothing?: A Little 6 Click Score: 21   End of Session Equipment Utilized During Treatment: Gait belt;Rolling walker Nurse Communication: Mobility status;Other (comment)(requesting assist getting changed)  Activity Tolerance: Patient tolerated treatment well Patient left: in bed;with call bell/phone within reach  OT Visit Diagnosis: Other abnormalities of gait and mobility (R26.89);Muscle weakness (generalized) (M62.81);Pain Pain - part of body: (abdomen)                Time: 0017-4944 OT Time Calculation (min): 31 min Charges:  OT General Charges $OT Visit: 1 Visit OT Evaluation $  OT Eval Low Complexity: 1 Low OT Treatments $Self Care/Home Management : 8-22 mins  Zenovia Jarred, MSOT, OTR/L Behavioral Health OT/ Acute Relief OT [facility] Office: [phone]  Zenovia Jarred 06/26/2018, 12:16 PM

## 2018-06-26 NOTE — Progress Notes (Signed)
PROGRESS NOTE    Latoya Curry  ZOX:[id number] DOB: [birth year] DOA: 06/23/2018 PCP: Josetta Huddle, MD   Brief Narrative:  HPI on 06/24/2018 Latoya Curry is a 85 y.o. female with medical history significant for hypertension, hyperlipidemia, ascending thoracic aortic aneurysm, hyperthyroidism s/p RAI therapy, and anxiety who presented to [facility] ED with 2 days of lower abdominal pain, nausea without emesis, and intermittent fevers.  Her symptoms persisted and she had a telemetry visit with her PCP who recommended she be evaluated in the ED due to a prior history of diverticulitis.  Patient otherwise denies any diaphoresis, chest pain, palpitations, dyspnea, diarrhea, dysuria or urinary frequency.  Interim history Patient admitted for acute appendicitis.  Status post surgery on 06/24/2018.  Currently has NG tube in place, pending further recommendations from general surgery.  Assessment & Plan   Sepsis secondary to Acute appendicitis vs UTI -Sepsis present on admission, as patient presented with fever, tachypnea, leukocytosis -Urine was cloudy in appearance, many bacteria, positive nitrites, moderate leukocytes,>50WBC (however patient states that she has not had any burning or pain with urination) -CT abdomen pelvis showed acute uncomplicated appendicitis -Urine culture >100k GNR- EColi Resistant to Cipro, Zosyn, ampicillin -Blood culture showed no growth to date -Initially placed on Zosyn, however given urine culture results will place her on ceftriaxone and Flagyl -Surgery consulted and appreciated, status post laparoscopic lysis of adhesions, laparoscopic appendectomy, primary incisional hernia repair.  Found to have a gangrenous appendicitis on partial small bowel obstruction.  NG tube was placed.  General surgery feels that patient is at risk for postoperative ileus and will likely need to be hospitalized for several days, continue antibiotics for 5 days. -Currently  n.p.o., continue NG tube decompression -Leukocytosis improving  Hypokalemia -Potassium 3.6, will continue to supplement -Continue to monitor BMP  Asymptomatic bacteriuria -See discussion as listed above -Urine culture >100k GNR-E. coli -Discontinue Zosyn, placed on ceftriaxone and Flagyl given results of urine culture  Essential hypertension -Blood pressure stable -will hold amlodipine, atenolol given that patient is currently n.p.o. -Placed on IV hydralazine as needed  Ascending thoracic aortic aneurysm -Patient follows with cardiothoracic surgery, Dr. Prescott Gum.  Stable, 5.4 cm. -Continue medical therapy with blood pressure control  Hyperthyroidism -Underwent radioactive iodine therapy in January 2020 and is currently followed by her PCP as an outpatient.  Currently not on any type of replacement therapy.  Anxiety/depression -Continue Celexa  Hyperlipidemia -Continue statin  DVT Prophylaxis  SCDs  Code Status: Full  Family Communication: None at bedside  Disposition Plan: Admitted. Patient at risk for post-op ileus. Will likely remain hospitalized for several days. Dispo TBD.  Consultants General surgery  Procedures  laparoscopic lysis of adhesions, laparoscopic appendectomy, primary incisional hernia repair  Antibiotics   Anti-infectives (From admission, onward)   Start     Dose/Rate Route Frequency Ordered Stop   06/26/18 1000  cefTRIAXone (ROCEPHIN) 2 g in sodium chloride 0.9 % 100 mL IVPB     2 g 200 mL/hr over 30 Minutes Intravenous Every 24 hours 06/26/18 0924     06/26/18 1000  metroNIDAZOLE (FLAGYL) IVPB 500 mg     500 mg 100 mL/hr over 60 Minutes Intravenous Every 8 hours 06/26/18 0924     06/24/18 1800  piperacillin-tazobactam (ZOSYN) IVPB 3.375 g  Status:  Discontinued     3.375 g 12.5 mL/hr over 240 Minutes Intravenous Every 8 hours 06/24/18 1603 06/26/18 0924   06/24/18 1115  cefoTEtan (CEFOTAN) 2 g in sodium chloride  0.9 % 100 mL IVPB     2 g  200 mL/hr over 30 Minutes Intravenous On call to O.R. 06/24/18 1102 06/24/18 1237   06/24/18 0200  piperacillin-tazobactam (ZOSYN) IVPB 3.375 g  Status:  Discontinued     3.375 g 12.5 mL/hr over 240 Minutes Intravenous Every 8 hours 06/24/18 0110 06/24/18 1603   06/23/18 1730  piperacillin-tazobactam (ZOSYN) IVPB 3.375 g     3.375 g 100 mL/hr over 30 Minutes Intravenous  Once 06/23/18 1715 06/24/18 1133      Subjective:   Latoya Curry seen and examined today.  Patient has no complaints today.  She states that she was able to walk around yesterday.  She denies any bowel movement or gas passage.  Currently denies chest pain, shortness of breath, nausea or vomiting, dizziness or headache.  Would like to have the NG tube removed. Objective:   Vitals:   06/25/18 0500 06/25/18 1516 06/25/18 2130 06/26/18 0533  BP:  135/79 134/75 133/73  Pulse:  98 99 95  Resp:  16 (!) 22 20  Temp:  98.6 F (37 C) 99 F (37.2 C) 99.9 F (37.7 C)  TempSrc:  Oral Oral Oral  SpO2:  90% 90% 90%  Weight: 72.5 kg   72.3 kg  Height:        Intake/Output Summary (Last 24 hours) at 06/26/2018 1103 Last data filed at 06/26/2018 0534 Gross per 24 hour  Intake 1373.26 ml  Output 2460 ml  Net -1086.74 ml   Filed Weights   06/24/18 2030 06/25/18 0500 06/26/18 0533  Weight: 71.5 kg 72.5 kg 72.3 kg   Exam  General: Well developed, well nourished, early, NAD  HEENT: NCAT, mucous membranes moist.  NG tube set to suction  Cardiovascular: S1 S2 auscultated, no murmur, RRR  Respiratory: Clear to auscultation bilaterally   Abdomen: Soft, nontender, nondistended, + bowel sounds, drain  Extremities: warm dry without cyanosis clubbing or edema  Neuro: AAOx3, hard of hearing, otherwise nonfocal  Psych: Normal affect and demeanor with intact judgement and insight  Data Reviewed: I have personally reviewed following labs and imaging studies  CBC: Recent Labs  Lab 06/23/18 1719 06/24/18 0848 06/25/18  0327 06/26/18 0312  WBC 24.8* 20.5* 12.9* 12.2*  NEUTROABS 22.8*  --   --   --   HGB 15.1* 14.5 12.7 11.7*  HCT 45.9 45.6 39.5 37.1  MCV 89.8 94.4 93.4 93.2  PLT 239 230 193 280   Basic Metabolic Panel: Recent Labs  Lab 06/23/18 1719 06/24/18 0848 06/25/18 0327 06/26/18 0312  NA 133* 136 137 137  K 2.6* 3.6 2.8* 3.6  CL 95* 103 102 98  CO2 25 22 24 29   GLUCOSE 130* 101* 100* 82  BUN 29* 32* 30* 24*  CREATININE 0.88 1.01* 1.02* 0.80  CALCIUM 9.2 9.0 8.6* 8.7*  MG 1.7 2.3 2.1  --    GFR: Estimated Creatinine Clearance: 51.4 mL/min (by C-G formula based on SCr of 0.8 mg/dL). Liver Function Tests: Recent Labs  Lab 06/23/18 1719  AST 29  ALT 16  ALKPHOS 58  BILITOT 1.7*  PROT 7.6  ALBUMIN 4.0   No results for input(s): LIPASE, AMYLASE in the last 168 hours. No results for input(s): AMMONIA in the last 168 hours. Coagulation Profile: No results for input(s): INR, PROTIME in the last 168 hours. Cardiac Enzymes: No results for input(s): CKTOTAL, CKMB, CKMBINDEX, TROPONINI in the last 168 hours. BNP (last 3 results) No results for input(s): PROBNP in the  last 8760 hours. HbA1C: No results for input(s): HGBA1C in the last 72 hours. CBG: No results for input(s): GLUCAP in the last 168 hours. Lipid Profile: No results for input(s): CHOL, HDL, LDLCALC, TRIG, CHOLHDL, LDLDIRECT in the last 72 hours. Thyroid Function Tests: No results for input(s): TSH, T4TOTAL, FREET4, T3FREE, THYROIDAB in the last 72 hours. Anemia Panel: No results for input(s): VITAMINB12, FOLATE, FERRITIN, TIBC, IRON, RETICCTPCT in the last 72 hours. Urine analysis:    Component Value Date/Time   COLORURINE YELLOW 06/23/2018 1719   APPEARANCEUR CLOUDY (A) 06/23/2018 1719   LABSPEC >1.030 (H) 06/23/2018 1719   PHURINE 5.5 06/23/2018 1719   GLUCOSEU NEGATIVE 06/23/2018 1719   HGBUR SMALL (A) 06/23/2018 1719   BILIRUBINUR NEGATIVE 06/23/2018 1719   KETONESUR NEGATIVE 06/23/2018 1719   PROTEINUR  NEGATIVE 06/23/2018 1719   UROBILINOGEN 0.2 01/08/2007 2322   NITRITE POSITIVE (A) 06/23/2018 1719   LEUKOCYTESUR MODERATE (A) 06/23/2018 1719   Sepsis Labs: @LABRCNTIP (procalcitonin:4,lacticidven:4)  ) Recent Results (from the past 240 hour(s))  Urine culture     Status: Abnormal   Collection Time: 06/23/18  5:19 PM  Result Value Ref Range Status   Specimen Description   Final    URINE, RANDOM Performed at [facility], [address] [place], [place] [address]    Special Requests   Final    NONE Performed at [facility], [address] [place], [place] [address]    Culture >=100,000 COLONIES/mL ESCHERICHIA COLI (A)  Final   Report Status 06/26/2018 FINAL  Final   Organism ID, Bacteria ESCHERICHIA COLI (A)  Final      Susceptibility   Escherichia coli - MIC*    AMPICILLIN >=32 RESISTANT Resistant     CEFAZOLIN 32 INTERMEDIATE Intermediate     CEFTRIAXONE <=1 SENSITIVE Sensitive     CIPROFLOXACIN >=4 RESISTANT Resistant     GENTAMICIN <=1 SENSITIVE Sensitive     IMIPENEM <=0.25 SENSITIVE Sensitive     NITROFURANTOIN <=16 SENSITIVE Sensitive     TRIMETH/SULFA <=20 SENSITIVE Sensitive     AMPICILLIN/SULBACTAM >=32 RESISTANT Resistant     PIP/TAZO >=128 RESISTANT Resistant     Extended ESBL NEGATIVE Sensitive     * >=100,000 COLONIES/mL ESCHERICHIA COLI  Blood culture (routine x 2)     Status: None (Preliminary result)   Collection Time: 06/23/18  5:20 PM  Result Value Ref Range Status   Specimen Description   Final    BLOOD BLOOD LEFT FOREARM Performed at [facility], [address] [place], [place] [address]    Special Requests   Final    BOTTLES DRAWN AEROBIC AND ANAEROBIC Blood Culture adequate volume Performed at [facility], [address] [place], [place] [address]    Culture  Setup Time   Final    GRAM POSITIVE RODS ANAEROBIC BOTTLE ONLY CRITICAL RESULT CALLED TO, READ BACK BY AND VERIFIED WITHKeturah Barre [facility]  PHARMD 1935 06/25/18 A BROWNING    Culture   Final    NO GROWTH 3 DAYS Performed at [facility], [place] [address] [place],  [address]    Report Status PENDING  Incomplete  Blood culture (routine x 2)     Status: None (Preliminary result)   Collection Time: 06/23/18  6:10 PM  Result Value Ref Range Status   Specimen Description   Final    BLOOD BLOOD RIGHT ARM Performed at [facility], [address] [place], [place] [address]    Special Requests   Final    BOTTLES DRAWN AEROBIC AND ANAEROBIC Blood Culture adequate volume Performed at [facility], [address] [place], [place] [address]    Culture  Setup Time   Final    GRAM POSITIVE RODS ANAEROBIC BOTTLE ONLY CRITICAL VALUE NOTED.  VALUE IS CONSISTENT WITH PREVIOUSLY REPORTED AND CALLED VALUE.    Culture   Final    NO GROWTH 3 DAYS Performed at [facility], [place] [address] [place], [place] [address]    Report Status PENDING  Incomplete  SARS Coronavirus 2 [facility] order, Performed in [place] hospital lab)     Status: None   Collection Time: 06/23/18  7:14 PM  Result Value Ref Range Status   SARS Coronavirus 2 NEGATIVE NEGATIVE Final    Comment: (NOTE) If result is NEGATIVE SARS-CoV-2 target nucleic acids are NOT DETECTED. The SARS-CoV-2 RNA is generally detectable in upper and lower  respiratory specimens during the acute phase of infection. The lowest  concentration of SARS-CoV-2 viral copies this assay can detect is 250  copies / mL. A negative result does not preclude SARS-CoV-2 infection  and should not be used as the sole basis for treatment or other  patient management decisions.  A negative result may occur with  improper specimen collection / handling, submission of specimen other  than nasopharyngeal swab, presence of viral mutation(s) within the  areas targeted by this assay, and inadequate number of viral copies  (<250 copies / mL). A negative result must be  combined with clinical  observations, patient history, and epidemiological information. If result is POSITIVE SARS-CoV-2 target nucleic acids are DETECTED. The SARS-CoV-2 RNA is generally detectable in upper and lower  respiratory specimens dur ing the acute phase of infection.  Positive  results are indicative of active infection with SARS-CoV-2.  Clinical  correlation with patient history and other diagnostic information is  necessary to determine patient infection status.  Positive results do  not rule out bacterial infection or co-infection with other viruses. If result is PRESUMPTIVE POSTIVE SARS-CoV-2 nucleic acids MAY BE PRESENT.   A presumptive positive result was obtained on the submitted specimen  and confirmed on repeat testing.  While 2019 novel coronavirus  (SARS-CoV-2) nucleic acids may be present in the submitted sample  additional confirmatory testing may be necessary for epidemiological  and / or clinical management purposes  to differentiate between  SARS-CoV-2 and other Sarbecovirus currently known to infect humans.  If clinically indicated additional testing with an alternate test  methodology [phone]) is advised. The SARS-CoV-2 RNA is generally  detectable in upper and lower respiratory sp ecimens during the acute  phase of infection. The expected result is Negative. Fact Sheet for Patients:  StrictlyIdeas.no Fact Sheet for Healthcare Providers: BankingDealers.co.za This test is not yet approved or cleared by the [place] FDA and has been authorized for detection and/or diagnosis of SARS-CoV-2 by FDA under an Emergency Use Authorization (EUA).  This EUA will remain in effect (meaning this test can be used) for the duration of the COVID-19 declaration under Section 564(b)(1) of the Act, 21 U.S.C. section 360bbb-3(b)(1), unless the authorization is terminated or revoked sooner. Performed at [facility], [place] [address], [place], [place] [address]   MRSA PCR Screening     Status: None   Collection Time: 06/24/18  2:11 AM  Result Value Ref Range Status   MRSA  by PCR NEGATIVE NEGATIVE Final    Comment:        The GeneXpert MRSA Assay (FDA approved for NASAL specimens only), is one component of a comprehensive MRSA colonization surveillance program. It is not intended to diagnose MRSA infection nor to guide or monitor treatment for MRSA infections. Performed at [facility], [place] [address], [place], [place] [address]       Radiology Studies: No results found.   Scheduled Meds: . amLODipine  2.5 mg Oral Daily  . atenolol  50 mg Oral Daily   And  . chlorthalidone  25 mg Oral Daily  . citalopram  20 mg Oral Daily  . lip balm  1 application Topical BID  . rosuvastatin  5 mg Oral Once per day on Mon Wed Fri   Continuous Infusions: . albumin human    . cefTRIAXone (ROCEPHIN)  IV    . lactated ringers 100 mL/hr at 06/26/18 0253  . methocarbamol (ROBAXIN) IV    . metronidazole    . ondansetron (ZOFRAN) IV       LOS: 3 days   Time Spent in minutes   30 minutes  Royale Lennartz D.O. on 06/26/2018 at 11:03 AM  Between 7am to 7pm - Please see pager noted on [email or web address]  After 7pm go to [email or web address]  And look for the night coverage person covering for me after hours  [facility] Office  [phone]

## 2018-06-26 NOTE — Progress Notes (Signed)
Physical Therapy Treatment Patient Details Name: Latoya Curry MRN: [id number] DOB: 1930/11/28 Today's Date: 06/26/2018    History of Present Illness 85 yo female admitted to ED on 4/29 with abdominal pain and fever. Covid -. Pt s/p lap appendectomy, incisional hernia repair, and lysis of adhesions 4/30. PMH includes LBP, diverticulitis, DJD, HTN, obesity, osteopenia, recurrent UTIs, transient global amnesia, bilateral TKR, ascending thoracic aortic aneurysm.     PT Comments    Pt is progressing well with mobility, she ambulated 150' with RW, no loss of balance. Reviewed log roll technique for getting out of bed.   Follow Up Recommendations  Home health PT;Supervision for mobility/OOB     Equipment Recommendations  Rolling walker with 5" wheels    Recommendations for Other Services       Precautions / Restrictions Precautions Precautions: Fall Precaution Comments: abdominal Restrictions Weight Bearing Restrictions: No    Mobility  Bed Mobility Overal bed mobility: Needs Assistance Bed Mobility: Rolling;Sidelying to Sit Rolling: Min assist Sidelying to sit: Min assist   Sit to supine: Mod assist   General bed mobility comments: pt up in chair on arrival; when back to bed pt needing mod A for BLE mgt and cueing/education for log rolling technique  Transfers Overall transfer level: Needs assistance Equipment used: Rolling walker (2 wheeled) Transfers: Sit to/from Stand Sit to Stand: Min guard         General transfer comment: VCs hand placement  Ambulation/Gait Ambulation/Gait assistance: Min guard Gait Distance (Feet): 150 Feet   Gait Pattern/deviations: Step-through pattern;Decreased stride length Gait velocity: decr    General Gait Details: min/guard safety, no loss of balance   Stairs             Wheelchair Mobility    Modified Rankin (Stroke Patients Only)       Balance Overall balance assessment: Needs assistance Sitting-balance  support: No upper extremity supported;Feet supported Sitting balance-Leahy Scale: Good     Standing balance support: Bilateral upper extremity supported;During functional activity Standing balance-Leahy Scale: Fair Standing balance comment: able to stand without UE support, requires RW for functional tasks                            Cognition Arousal/Alertness: Awake/alert Behavior During Therapy: WFL for tasks assessed/performed Overall Cognitive Status: Within Functional Limits for tasks assessed                                        Exercises      General Comments General comments (skin integrity, edema, etc.): JP drain, NG for suction      Pertinent Vitals/Pain Pain Assessment: Faces Faces Pain Scale: Hurts a little bit Pain Location: abdomen, with movement Pain Descriptors / Indicators: Sore Pain Intervention(s): Limited activity within patient's tolerance;Monitored during session;Repositioned    Home Living Family/patient expects to be discharged to:: Private residence(ILF) Living Arrangements: Alone Available Help at Discharge: Family;Available PRN/intermittently(pt is to d/c home with dtr, home description reflects dtr home) Type of Home: House Home Access: Level entry   Home Layout: Able to live on main level with bedroom/bathroom Home Equipment: Cane - single point      Prior Function Level of Independence: Independent with assistive device(s)      Comments: having IADL needs met per ILF   PT Goals (current goals can now be found in the  care plan section) Acute Rehab PT Goals Patient Stated Goal: likes to play bridge, and help daughter sew masks PT Goal Formulation: With patient Time For Goal Achievement: 07/09/18 Potential to Achieve Goals: Good Progress towards PT goals: Progressing toward goals    Frequency    Min 4X/week      PT Plan Current plan remains appropriate    Co-evaluation              AM-PAC  PT "6 Clicks" Mobility   Outcome Measure  Help needed turning from your back to your side while in a flat bed without using bedrails?: A Little Help needed moving from lying on your back to sitting on the side of a flat bed without using bedrails?: A Little Help needed moving to and from a bed to a chair (including a wheelchair)?: A Little Help needed standing up from a chair using your arms (e.g., wheelchair or bedside chair)?: A Little Help needed to walk in hospital room?: A Little Help needed climbing 3-5 steps with a railing? : A Little 6 Click Score: 18    End of Session Equipment Utilized During Treatment: Gait belt;Other (comment)( clamped NG for mobility ) Activity Tolerance: Patient limited by pain Patient left: in chair;with call bell/phone within reach;with nursing/sitter in room Nurse Communication: Mobility status PT Visit Diagnosis: Other abnormalities of gait and mobility (R26.89);Pain;Muscle weakness (generalized) (M62.81) Pain - Right/Left: Right Pain - part of body: (abdomen)     Time: 4069-8614 PT Time Calculation (min) (ACUTE ONLY): 23 min  Charges:  $Gait Training: 8-22 mins $Therapeutic Activity: 8-22 mins                     Blondell Reveal Kistler PT 06/26/2018  Acute Rehabilitation Services Pager [phone] Office [phone]

## 2018-06-27 LAB — BASIC METABOLIC PANEL
Anion gap: 12 (ref 5–15)
BUN: 18 mg/dL (ref 8–23)
CO2: 27 mmol/L (ref 22–32)
Calcium: 8.6 mg/dL — ABNORMAL LOW (ref 8.9–10.3)
Chloride: 98 mmol/L (ref 98–111)
Creatinine, Ser: 0.71 mg/dL (ref 0.44–1.00)
GFR calc Af Amer: >60 mL/min (ref >60)
GFR calc non Af Amer: >60 mL/min (ref >60)
Glucose, Bld: 67 mg/dL — ABNORMAL LOW (ref 70–99)
Potassium: 3 mmol/L — ABNORMAL LOW (ref 3.5–5.1)
Sodium: 137 mmol/L (ref 135–145)

## 2018-06-27 LAB — MAGNESIUM: Magnesium: 1.7 mg/dL (ref 1.7–2.4)

## 2018-06-27 LAB — CBC
HCT: 36.2 % (ref 36.0–46.0)
Hemoglobin: 11.7 g/dL — ABNORMAL LOW (ref 12.0–15.0)
MCH: 30.1 pg (ref 26.0–34.0)
MCHC: 32.3 g/dL (ref 30.0–36.0)
MCV: 93.1 fL (ref 80.0–100.0)
Platelets: 244 10*3/uL (ref 150–400)
RBC: 3.89 MIL/uL (ref 3.87–5.11)
RDW: 14.2 % (ref 11.5–15.5)
WBC: 11.6 10*3/uL — ABNORMAL HIGH (ref 4.0–10.5)
nRBC: 0 % (ref 0.0–0.2)

## 2018-06-27 MED ORDER — MAGNESIUM SULFATE 2 GM/50ML IV SOLN
2.0000 g | Freq: Once | INTRAVENOUS | Status: AC
  Administered 2018-06-27: 2 g via INTRAVENOUS
  Filled 2018-06-27: qty 50

## 2018-06-27 MED ORDER — POTASSIUM CHLORIDE 10 MEQ/100ML IV SOLN
10.0000 meq | INTRAVENOUS | Status: AC
  Administered 2018-06-27 (×3): 10 meq via INTRAVENOUS
  Filled 2018-06-27 (×2): qty 100

## 2018-06-27 NOTE — Plan of Care (Signed)

## 2018-06-27 NOTE — Progress Notes (Signed)
Assessment & Plan: POD#3 - Gangrenous appendicitis, pelvic access, partial small bowel obstruction due to adhesions, small supraumbilical incarcerated incisional hernia. Laparoscopic lysis of adhesions, laparoscopic appendectomy, primary incisional hernia repair,drain placement,06/24/2018 Dr. Michael Boston              Discontinue NG this AM, begin clear liquid diet             Continue IV abx             OOB to chair, ambulate with assistance             Monitor drain output        Armandina Gemma, MD       [facility]       Office: [phone]   Chief Complaint: Acute appendicitis with abscess  Subjective: Patient in bed, comfortable, pleasant.  Nurse in room.  Had BM.  Wants to eat.  Objective: Vital signs in last 24 hours: Temp:  [98.6 F (37 C)-98.9 F (37.2 C)] 98.6 F (37 C) (05/02 2039) Pulse Rate:  [95-104] 95 (05/02 2039) Resp:  [12-14] 12 (05/02 2039) BP: (148-149)/(76-82) 149/82 (05/02 2039) SpO2:  [93 %-94 %] 94 % (05/02 2039) Weight:  [73.3 kg] 73.3 kg (05/03 0719) Last BM Date: 06/26/18  Intake/Output from previous day: 05/02 0701 - 05/03 0700 In: 2233.5 [P.O.:220; I.V.:1367.5; IV Piggyback:646] Out: 2030 [Urine:800; Emesis/NG output:1200; Drains:30] Intake/Output this shift: No intake/output data recorded.  Physical Exam: HEENT - sclerae clear, mucous membranes moist Neck - soft Chest - clear bilaterally Cor - RRR Abdomen - soft, mild diffuse tenderness; BS present; dressings dry and intact Ext - no edema, non-tender Neuro - alert & oriented, no focal deficits  Lab Results:  Recent Labs    06/26/18 0312 06/27/18 0420  WBC 12.2* 11.6*  HGB 11.7* 11.7*  HCT 37.1 36.2  PLT 225 244   BMET Recent Labs    06/26/18 0312 06/27/18 0420  NA 137 137  K 3.6 3.0*  CL 98 98  CO2 29 27  GLUCOSE 82 67*  BUN 24* 18  CREATININE 0.80 0.71  CALCIUM 8.7* 8.6*   PT/INR No results for input(s): LABPROT, INR in the last 72  hours. Comprehensive Metabolic Panel:    Component Value Date/Time   NA 137 06/27/2018 0420   NA 137 06/26/2018 0312   NA 144 08/25/2017 0910   K 3.0 (L) 06/27/2018 0420   K 3.6 06/26/2018 0312   CL 98 06/27/2018 0420   CL 98 06/26/2018 0312   CO2 27 06/27/2018 0420   CO2 29 06/26/2018 0312   BUN 18 06/27/2018 0420   BUN 24 (H) 06/26/2018 0312   BUN 25 08/25/2017 0910   CREATININE 0.71 06/27/2018 0420   CREATININE 0.80 06/26/2018 0312   GLUCOSE 67 (L) 06/27/2018 0420   GLUCOSE 82 06/26/2018 0312   CALCIUM 8.6 (L) 06/27/2018 0420   CALCIUM 8.7 (L) 06/26/2018 0312   AST 29 06/23/2018 1719   AST 26 08/25/2017 0910   ALT 16 06/23/2018 1719   ALT 10 08/25/2017 0910   ALKPHOS 58 06/23/2018 1719   ALKPHOS 72 08/25/2017 0910   BILITOT 1.7 (H) 06/23/2018 1719   BILITOT 0.6 08/25/2017 0910   BILITOT 1.2 02/01/2015 1235   PROT 7.6 06/23/2018 1719   PROT 7.1 08/25/2017 0910   PROT 7.1 02/01/2015 1235   ALBUMIN 4.0 06/23/2018 1719   ALBUMIN 4.4 08/25/2017 0910   ALBUMIN 3.8 02/01/2015 1235  Studies/Results: No results found.    Armandina Gemma 06/27/2018  Patient ID: Latoya Curry, female   DOB: 08-09-1930, 85 y.o.   MRN: [id number]

## 2018-06-27 NOTE — Progress Notes (Signed)
PROGRESS NOTE    [name]  TKP:[id number] DOB: [birth year] DOA: 06/23/2018 PCP: Josetta Huddle, MD   Brief Narrative:  HPI on 06/24/2018 Latoya Curry is a 85 y.o. female with medical history significant for hypertension, hyperlipidemia, ascending thoracic aortic aneurysm, hyperthyroidism s/p RAI therapy, and anxiety who presented to [facility] ED with 2 days of lower abdominal pain, nausea without emesis, and intermittent fevers.  Her symptoms persisted and she had a telemetry visit with her PCP who recommended she be evaluated in the ED due to a prior history of diverticulitis.  Patient otherwise denies any diaphoresis, chest pain, palpitations, dyspnea, diarrhea, dysuria or urinary frequency.  Interim history Patient admitted for acute appendicitis.  Status post surgery on 06/24/2018.  Currently has NG tube in place, pending further recommendations from general surgery.  Assessment & Plan   Sepsis secondary to Acute appendicitis vs UTI -Sepsis present on admission, as patient presented with fever, tachypnea, leukocytosis -Urine was cloudy in appearance, many bacteria, positive nitrites, moderate leukocytes,>50WBC (however patient states that she has not had any burning or pain with urination) -CT abdomen pelvis showed acute uncomplicated appendicitis -Urine culture >100k GNR- EColi Resistant to Cipro, Zosyn, ampicillin -Blood culture showed no growth to date -Initially placed on Zosyn, however given urine culture results placed her on ceftriaxone and Flagyl -Surgery consulted and appreciated, status post laparoscopic lysis of adhesions, laparoscopic appendectomy, primary incisional hernia repair.  Found to have a gangrenous appendicitis on partial small bowel obstruction.  NG tube was placed.  General surgery feels that patient is at risk for postoperative ileus and will likely need to be hospitalized for several days, continue antibiotics for 5 days. -Leukocytosis  improving -Discussed with sugery, Dr. Harlow Asa, NG tube clamped and patient started on clear liquid diet today  Hypokalemia -potassium 3, magnesium 1.7 (goal of 4 and 2 respectively) -will continue to replace and monitor BMP  Asymptomatic bacteriuria -See discussion as listed above -Urine culture >100k GNR-E. coli -Discontinued Zosyn -Continue ceftriaxone and Flagyl given results of urine culture  Essential hypertension -Blood pressure stable -will hold amlodipine, atenolol given that patient is currently n.p.o. -Placed on IV hydralazine as needed  Ascending thoracic aortic aneurysm -Patient follows with cardiothoracic surgery, Dr. Prescott Gum.  Stable, 5.4 cm. -Continue medical therapy with blood pressure control  Hyperthyroidism -Underwent radioactive iodine therapy in January 2020 and is currently followed by her PCP as an outpatient.  Currently not on any type of replacement therapy.  Anxiety/depression -Continue Celexa  Hyperlipidemia -Continue statin  DVT Prophylaxis  SCDs  Code Status: Full  Family Communication: None at bedside  Disposition Plan: Admitted. Patient at risk for post-op ileus. Will likely remain hospitalized for several days. Dispo TBD.  Consultants General surgery  Procedures  laparoscopic lysis of adhesions, laparoscopic appendectomy, primary incisional hernia repair  Antibiotics   Anti-infectives (From admission, onward)   Start     Dose/Rate Route Frequency Ordered Stop   06/26/18 1000  cefTRIAXone (ROCEPHIN) 2 g in sodium chloride 0.9 % 100 mL IVPB     2 g 200 mL/hr over 30 Minutes Intravenous Every 24 hours 06/26/18 0924     06/26/18 1000  metroNIDAZOLE (FLAGYL) IVPB 500 mg     500 mg 100 mL/hr over 60 Minutes Intravenous Every 8 hours 06/26/18 0924     06/24/18 1800  piperacillin-tazobactam (ZOSYN) IVPB 3.375 g  Status:  Discontinued     3.375 g 12.5 mL/hr over 240 Minutes Intravenous Every 8 hours 06/24/18  1603 06/26/18 0924    06/24/18 1115  cefoTEtan (CEFOTAN) 2 g in sodium chloride 0.9 % 100 mL IVPB     2 g 200 mL/hr over 30 Minutes Intravenous On call to O.R. 06/24/18 1102 06/24/18 1237   06/24/18 0200  piperacillin-tazobactam (ZOSYN) IVPB 3.375 g  Status:  Discontinued     3.375 g 12.5 mL/hr over 240 Minutes Intravenous Every 8 hours 06/24/18 0110 06/24/18 1603   06/23/18 1730  piperacillin-tazobactam (ZOSYN) IVPB 3.375 g     3.375 g 100 mL/hr over 30 Minutes Intravenous  Once 06/23/18 1715 06/24/18 1133      Subjective:   [name] seen and examined today.  Patient would like to get the NG tube out. She denies chest pain, shortness of breath, dizziness or headache. States she has been having loose stools.  Objective:   Vitals:   06/26/18 0533 06/26/18 1401 06/26/18 2039 06/27/18 0719  BP: 133/73 (!) 148/76 (!) 149/82   Pulse: 95 (!) 104 95   Resp: 20 14 12    Temp: 99.9 F (37.7 C) 98.9 F (37.2 C) 98.6 F (37 C)   TempSrc: Oral Oral Oral   SpO2: 90% 93% 94%   Weight: 72.3 kg   73.3 kg  Height:        Intake/Output Summary (Last 24 hours) at 06/27/2018 0955 Last data filed at 06/27/2018 0948 Gross per 24 hour  Intake 2233.47 ml  Output 2030 ml  Net 203.47 ml   Filed Weights   06/25/18 0500 06/26/18 0533 06/27/18 0719  Weight: 72.5 kg 72.3 kg 73.3 kg   Exam  General: Well developed, well nourished, NAD, appears stated age  HEENT: NCAT, mucous membranes moist.   Neck: Supple  Cardiovascular: S1 S2 auscultated, no rubs, murmurs or gallops. Regular rate and rhythm.  Respiratory: Clear to auscultation bilaterally with equal chest rise  Abdomen: Soft, mildly TTP, nontender, +BS, dressings in place  Extremities: warm dry without cyanosis clubbing or edema  Neuro: AAOx3, nonfocal  Psych: Normal affect and demeanor, pleasant    Data Reviewed: I have personally reviewed following labs and imaging studies  CBC: Recent Labs  Lab 06/23/18 1719 06/24/18 0848 06/25/18 0327  06/26/18 0312 06/27/18 0420  WBC 24.8* 20.5* 12.9* 12.2* 11.6*  NEUTROABS 22.8*  --   --   --   --   HGB 15.1* 14.5 12.7 11.7* 11.7*  HCT 45.9 45.6 39.5 37.1 36.2  MCV 89.8 94.4 93.4 93.2 93.1  PLT 239 230 193 225 315   Basic Metabolic Panel: Recent Labs  Lab 06/23/18 1719 06/24/18 0848 06/25/18 0327 06/26/18 0312 06/27/18 0420  NA 133* 136 137 137 137  K 2.6* 3.6 2.8* 3.6 3.0*  CL 95* 103 102 98 98  CO2 25 22 24 29 27   GLUCOSE 130* 101* 100* 82 67*  BUN 29* 32* 30* 24* 18  CREATININE 0.88 1.01* 1.02* 0.80 0.71  CALCIUM 9.2 9.0 8.6* 8.7* 8.6*  MG 1.7 2.3 2.1  --  1.7   GFR: Estimated Creatinine Clearance: 51.7 mL/min (by C-G formula based on SCr of 0.71 mg/dL). Liver Function Tests: Recent Labs  Lab 06/23/18 1719  AST 29  ALT 16  ALKPHOS 58  BILITOT 1.7*  PROT 7.6  ALBUMIN 4.0   No results for input(s): LIPASE, AMYLASE in the last 168 hours. No results for input(s): AMMONIA in the last 168 hours. Coagulation Profile: No results for input(s): INR, PROTIME in the last 168 hours. Cardiac Enzymes: No results for  input(s): CKTOTAL, CKMB, CKMBINDEX, TROPONINI in the last 168 hours. BNP (last 3 results) No results for input(s): PROBNP in the last 8760 hours. HbA1C: No results for input(s): HGBA1C in the last 72 hours. CBG: No results for input(s): GLUCAP in the last 168 hours. Lipid Profile: No results for input(s): CHOL, HDL, LDLCALC, TRIG, CHOLHDL, LDLDIRECT in the last 72 hours. Thyroid Function Tests: No results for input(s): TSH, T4TOTAL, FREET4, T3FREE, THYROIDAB in the last 72 hours. Anemia Panel: No results for input(s): VITAMINB12, FOLATE, FERRITIN, TIBC, IRON, RETICCTPCT in the last 72 hours. Urine analysis:    Component Value Date/Time   COLORURINE YELLOW 06/23/2018 1719   APPEARANCEUR CLOUDY (A) 06/23/2018 1719   LABSPEC >1.030 (H) 06/23/2018 1719   PHURINE 5.5 06/23/2018 1719   GLUCOSEU NEGATIVE 06/23/2018 1719   HGBUR SMALL (A) 06/23/2018 1719    BILIRUBINUR NEGATIVE 06/23/2018 1719   KETONESUR NEGATIVE 06/23/2018 1719   PROTEINUR NEGATIVE 06/23/2018 1719   UROBILINOGEN 0.2 01/08/2007 2322   NITRITE POSITIVE (A) 06/23/2018 1719   LEUKOCYTESUR MODERATE (A) 06/23/2018 1719   Sepsis Labs: @LABRCNTIP (procalcitonin:4,lacticidven:4)  ) Recent Results (from the past 240 hour(s))  Urine culture     Status: Abnormal   Collection Time: 06/23/18  5:19 PM  Result Value Ref Range Status   Specimen Description   Final    URINE, RANDOM Performed at [facility], [address] [place], [place] [address]    Special Requests   Final    NONE Performed at [facility], [address] [place], [place] [address]    Culture >=100,000 COLONIES/mL ESCHERICHIA COLI (A)  Final   Report Status 06/26/2018 FINAL  Final   Organism ID, Bacteria ESCHERICHIA COLI (A)  Final      Susceptibility   Escherichia coli - MIC*    AMPICILLIN >=32 RESISTANT Resistant     CEFAZOLIN 32 INTERMEDIATE Intermediate     CEFTRIAXONE <=1 SENSITIVE Sensitive     CIPROFLOXACIN >=4 RESISTANT Resistant     GENTAMICIN <=1 SENSITIVE Sensitive     IMIPENEM <=0.25 SENSITIVE Sensitive     NITROFURANTOIN <=16 SENSITIVE Sensitive     TRIMETH/SULFA <=20 SENSITIVE Sensitive     AMPICILLIN/SULBACTAM >=32 RESISTANT Resistant     PIP/TAZO >=128 RESISTANT Resistant     Extended ESBL NEGATIVE Sensitive     * >=100,000 COLONIES/mL ESCHERICHIA COLI  Blood culture (routine x 2)     Status: None (Preliminary result)   Collection Time: 06/23/18  5:20 PM  Result Value Ref Range Status   Specimen Description   Final    BLOOD BLOOD LEFT FOREARM Performed at [facility], [address] [place], [place] [address]    Special Requests   Final    BOTTLES DRAWN AEROBIC AND ANAEROBIC Blood Culture adequate volume Performed at [facility], [address] [place], [place] [address]    Culture  Setup Time   Final    GRAM POSITIVE RODS  ANAEROBIC BOTTLE ONLY CRITICAL RESULT CALLED TO, READ BACK BY AND VERIFIED WITHKeturah Barre [facility] PHARMD 1935 06/25/18 A BROWNING Performed at [facility], [address] [place], [place] [address]    Culture GRAM POSITIVE RODS  Final   Report Status PENDING  Incomplete  Blood culture (routine x 2)     Status: None (Preliminary result)   Collection Time: 06/23/18  6:10 PM  Result Value Ref Range Status   Specimen Description   Final  BLOOD BLOOD RIGHT ARM Performed at [facility], [address] [place], [place] [address]    Special Requests   Final    BOTTLES DRAWN AEROBIC AND ANAEROBIC Blood Culture adequate volume Performed at [facility], [address] [place], [place] [address]    Culture  Setup Time   Final    GRAM POSITIVE RODS ANAEROBIC BOTTLE ONLY CRITICAL VALUE NOTED.  VALUE IS CONSISTENT WITH PREVIOUSLY REPORTED AND CALLED VALUE. Performed at [facility], [place] [address], [place], [place] [address]    Culture GRAM POSITIVE RODS  Final   Report Status PENDING  Incomplete  SARS Coronavirus 2 [facility] order, Performed in [place] hospital lab)     Status: None   Collection Time: 06/23/18  7:14 PM  Result Value Ref Range Status   SARS Coronavirus 2 NEGATIVE NEGATIVE Final    Comment: (NOTE) If result is NEGATIVE SARS-CoV-2 target nucleic acids are NOT DETECTED. The SARS-CoV-2 RNA is generally detectable in upper and lower  respiratory specimens during the acute phase of infection. The lowest  concentration of SARS-CoV-2 viral copies this assay can detect is 250  copies / mL. A negative result does not preclude SARS-CoV-2 infection  and should not be used as the sole basis for treatment or other  patient management decisions.  A negative result may occur with  improper specimen collection / handling, submission of specimen other  than nasopharyngeal swab, presence of viral mutation(s) within the  areas targeted by this assay, and  inadequate number of viral copies  (<250 copies / mL). A negative result must be combined with clinical  observations, patient history, and epidemiological information. If result is POSITIVE SARS-CoV-2 target nucleic acids are DETECTED. The SARS-CoV-2 RNA is generally detectable in upper and lower  respiratory specimens dur ing the acute phase of infection.  Positive  results are indicative of active infection with SARS-CoV-2.  Clinical  correlation with patient history and other diagnostic information is  necessary to determine patient infection status.  Positive results do  not rule out bacterial infection or co-infection with other viruses. If result is PRESUMPTIVE POSTIVE SARS-CoV-2 nucleic acids MAY BE PRESENT.   A presumptive positive result was obtained on the submitted specimen  and confirmed on repeat testing.  While 2019 novel coronavirus  (SARS-CoV-2) nucleic acids may be present in the submitted sample  additional confirmatory testing may be necessary for epidemiological  and / or clinical management purposes  to differentiate between  SARS-CoV-2 and other Sarbecovirus currently known to infect humans.  If clinically indicated additional testing with an alternate test  methodology [phone]) is advised. The SARS-CoV-2 RNA is generally  detectable in upper and lower respiratory sp ecimens during the acute  phase of infection. The expected result is Negative. Fact Sheet for Patients:  StrictlyIdeas.no Fact Sheet for Healthcare Providers: BankingDealers.co.za This test is not yet approved or cleared by the [place] FDA and has been authorized for detection and/or diagnosis of SARS-CoV-2 by FDA under an Emergency Use Authorization (EUA).  This EUA will remain in effect (meaning this test can be used) for the duration of the COVID-19 declaration under Section 564(b)(1) of the Act, 21 U.S.C. section 360bbb-3(b)(1), unless the  authorization is terminated or revoked sooner. Performed at [facility], [place] [address], [place], [place] [address]   MRSA PCR Screening     Status: None   Collection Time: 06/24/18  2:11 AM  Result Value  Ref Range Status   MRSA by PCR NEGATIVE NEGATIVE Final    Comment:        The GeneXpert MRSA Assay (FDA approved for NASAL specimens only), is one component of a comprehensive MRSA colonization surveillance program. It is not intended to diagnose MRSA infection nor to guide or monitor treatment for MRSA infections. Performed at [facility], [place] [address], [place], [place] [address]       Radiology Studies: No results found.   Scheduled Meds: . lip balm  1 application Topical BID  . rosuvastatin  5 mg Oral Once per day on Mon Wed Fri   Continuous Infusions: . albumin human    . cefTRIAXone (ROCEPHIN)  IV Stopped (06/26/18 1413)  . lactated ringers 75 mL/hr at 06/27/18 0600  . magnesium sulfate 1 - 4 g bolus IVPB    . methocarbamol (ROBAXIN) IV    . metronidazole 500 mg (06/27/18 0850)  . ondansetron (ZOFRAN) IV    . potassium chloride 10 mEq (06/27/18 0853)     LOS: 4 days   Time Spent in minutes   30 minutes  Dimetrius Montfort D.O. on 06/27/2018 at 9:55 AM  Between 7am to 7pm - Please see pager noted on [email or web address]  After 7pm go to [email or web address]  And look for the night coverage person covering for me after hours  [facility] Office  [phone]

## 2018-06-28 LAB — MAGNESIUM: Magnesium: 1.8 mg/dL (ref 1.7–2.4)

## 2018-06-28 LAB — CBC
HCT: 36.8 % (ref 36.0–46.0)
Hemoglobin: 12.1 g/dL (ref 12.0–15.0)
MCH: 30.1 pg (ref 26.0–34.0)
MCHC: 32.9 g/dL (ref 30.0–36.0)
MCV: 91.5 fL (ref 80.0–100.0)
Platelets: 272 10*3/uL (ref 150–400)
RBC: 4.02 MIL/uL (ref 3.87–5.11)
RDW: 14.1 % (ref 11.5–15.5)
WBC: 10.8 10*3/uL — ABNORMAL HIGH (ref 4.0–10.5)
nRBC: 0 % (ref 0.0–0.2)

## 2018-06-28 LAB — BASIC METABOLIC PANEL
Anion gap: 7 (ref 5–15)
BUN: 10 mg/dL (ref 8–23)
CO2: 29 mmol/L (ref 22–32)
Calcium: 8.4 mg/dL — ABNORMAL LOW (ref 8.9–10.3)
Chloride: 100 mmol/L (ref 98–111)
Creatinine, Ser: 0.65 mg/dL (ref 0.44–1.00)
GFR calc Af Amer: >60 mL/min (ref >60)
GFR calc non Af Amer: >60 mL/min (ref >60)
Glucose, Bld: 109 mg/dL — ABNORMAL HIGH (ref 70–99)
Potassium: 3 mmol/L — ABNORMAL LOW (ref 3.5–5.1)
Sodium: 136 mmol/L (ref 135–145)

## 2018-06-28 MED ORDER — POTASSIUM CHLORIDE 10 MEQ/100ML IV SOLN
10.0000 meq | INTRAVENOUS | Status: DC
  Filled 2018-06-28: qty 100

## 2018-06-28 MED ORDER — POTASSIUM CHLORIDE CRYS ER 20 MEQ PO TBCR
40.0000 meq | EXTENDED_RELEASE_TABLET | Freq: Once | ORAL | Status: AC
  Administered 2018-06-28: 40 meq via ORAL
  Filled 2018-06-28: qty 2

## 2018-06-28 MED ORDER — ALPRAZOLAM 0.25 MG PO TABS
0.2500 mg | ORAL_TABLET | Freq: Once | ORAL | Status: AC
  Administered 2018-06-28: 0.25 mg via ORAL
  Filled 2018-06-28: qty 1

## 2018-06-28 MED ORDER — POTASSIUM CHLORIDE 10 MEQ/100ML IV SOLN
10.0000 meq | INTRAVENOUS | Status: DC
  Administered 2018-06-28 (×4): 10 meq via INTRAVENOUS
  Filled 2018-06-28 (×3): qty 100

## 2018-06-28 MED ORDER — ZOLPIDEM TARTRATE 5 MG PO TABS
5.0000 mg | ORAL_TABLET | Freq: Every evening | ORAL | Status: DC | PRN
  Administered 2018-06-29 – 2018-07-01 (×4): 5 mg via ORAL
  Filled 2018-06-28 (×6): qty 1

## 2018-06-28 NOTE — Progress Notes (Signed)
Physical Therapy Treatment Patient Details Name: Latoya Curry MRN: [id number] DOB: 1930-05-01 Today's Date: 06/28/2018    History of Present Illness 85 yo female admitted to ED on 4/29 with abdominal pain and fever. Covid -. Pt s/p lap appendectomy, incisional hernia repair, and lysis of adhesions 4/30. PMH includes LBP, diverticulitis, DJD, HTN, obesity, osteopenia, recurrent UTIs, transient global amnesia, bilateral TKR, ascending thoracic aortic aneurysm.     PT Comments    Pt feeling "tired".  Assisted OOB to amb.  General transfer comment: 25% VC's on safety with turns using walker (pt not use to) also assisted to bathroom with assist to navigate tight spaces.  General Gait Details: distance limited to and from bathroom due to uncontrolled, loose, leaking stools.  Amb to and from bathroom only.    Follow Up Recommendations  Home health PT;Supervision for mobility/OOB     Equipment Recommendations  Rolling walker with 5" wheels    Recommendations for Other Services       Precautions / Restrictions Precautions Precautions: Fall Precaution Comments: recent ABD surgery/drain Restrictions Weight Bearing Restrictions: No    Mobility  Bed Mobility Overal bed mobility: Needs Assistance         Sit to supine: Min assist   General bed mobility comments: assist upper body due recent ABD surgery as well as increased time   Transfers Overall transfer level: Needs assistance Equipment used: Rolling walker (2 wheeled) Transfers: Sit to/from Stand Sit to Stand: Min guard;Min assist         General transfer comment: 25% VC's on safety with turns using walker (pt not use to) also assisted to bathroom with assist to navigate tight spaces.    Ambulation/Gait Ambulation/Gait assistance: Min guard Gait Distance (Feet): 22 Feet(11 feet x2 to and from bathroom) Assistive device: Rolling walker (2 wheeled) Gait Pattern/deviations: Step-through pattern;Decreased stride length      General Gait Details: distance limited to and from bathroom due to uncontrolled, loose, leaking stools.  Amb to and from bathroom only.    Stairs             Wheelchair Mobility    Modified Rankin (Stroke Patients Only)       Balance                                            Cognition Arousal/Alertness: Awake/alert Behavior During Therapy: WFL for tasks assessed/performed Overall Cognitive Status: Within Functional Limits for tasks assessed                                        Exercises      General Comments        Pertinent Vitals/Pain Pain Assessment: Faces Faces Pain Scale: Hurts a little bit Pain Location: abdomen, with movement Pain Descriptors / Indicators: Aching Pain Intervention(s): Monitored during session    Home Living                      Prior Function            PT Goals (current goals can now be found in the care plan section) Progress towards PT goals: Progressing toward goals    Frequency    Min 4X/week      PT Plan Current plan remains  appropriate    Co-evaluation              AM-PAC PT "6 Clicks" Mobility   Outcome Measure  Help needed turning from your back to your side while in a flat bed without using bedrails?: A Little Help needed moving from lying on your back to sitting on the side of a flat bed without using bedrails?: A Little Help needed moving to and from a bed to a chair (including a wheelchair)?: A Little Help needed standing up from a chair using your arms (e.g., wheelchair or bedside chair)?: A Little Help needed to walk in hospital room?: A Little Help needed climbing 3-5 steps with a railing? : A Little 6 Click Score: 18    End of Session Equipment Utilized During Treatment: Gait belt Activity Tolerance: Other (comment)(limited due to loose stools) Patient left: in chair;with call bell/phone within reach;with nursing/sitter in room Nurse  Communication: Mobility status PT Visit Diagnosis: Other abnormalities of gait and mobility (R26.89);Pain;Muscle weakness (generalized) (M62.81)     Time: 8916-9450 PT Time Calculation (min) (ACUTE ONLY): 25 min  Charges:  $Gait Training: 8-22 mins $Therapeutic Activity: 8-22 mins                     Rica Koyanagi  PTA Acute  Rehabilitation Services Pager      [phone] Office      [phone]

## 2018-06-28 NOTE — Progress Notes (Signed)
4 Days Post-Op   Subjective/Chief Complaint: Tolerated clear liquids Having BM's Almost no pain   Objective: Vital signs in last 24 hours: Temp:  [98.6 F (37 C)-99.3 F (37.4 C)] 98.6 F (37 C) (05/04 0656) Pulse Rate:  [85-110] 85 (05/04 0656) Resp:  [14-16] 15 (05/04 0656) BP: (145-156)/(82-85) 152/85 (05/04 0656) SpO2:  [92 %-95 %] 95 % (05/04 0656) Weight:  [73.4 kg] 73.4 kg (05/04 0148) Last BM Date: 06/27/18  Intake/Output from previous day: 05/03 0701 - 05/04 0700 In: 2170.9 [P.O.:240; I.V.:1462.2; IV Piggyback:468.6] Out: 2385 [Urine:1650; Emesis/NG output:700; Drains:35] Intake/Output this shift: No intake/output data recorded.  Exam: Awake and alert Abdomen soft, drain serosang Non-distended  Lab Results:  Recent Labs    06/27/18 0420 06/28/18 0355  WBC 11.6* 10.8*  HGB 11.7* 12.1  HCT 36.2 36.8  PLT 244 272   BMET Recent Labs    06/27/18 0420 06/28/18 0355  NA 137 136  K 3.0* 3.0*  CL 98 100  CO2 27 29  GLUCOSE 67* 109*  BUN 18 10  CREATININE 0.71 0.65  CALCIUM 8.6* 8.4*   PT/INR No results for input(s): LABPROT, INR in the last 72 hours. ABG No results for input(s): PHART, HCO3 in the last 72 hours.  Invalid input(s): PCO2, PO2  Studies/Results: No results found.  Anti-infectives: Anti-infectives (From admission, onward)   Start     Dose/Rate Route Frequency Ordered Stop   06/26/18 1000  cefTRIAXone (ROCEPHIN) 2 g in sodium chloride 0.9 % 100 mL IVPB     2 g 200 mL/hr over 30 Minutes Intravenous Every 24 hours 06/26/18 0924     06/26/18 1000  metroNIDAZOLE (FLAGYL) IVPB 500 mg     500 mg 100 mL/hr over 60 Minutes Intravenous Every 8 hours 06/26/18 0924     06/24/18 1800  piperacillin-tazobactam (ZOSYN) IVPB 3.375 g  Status:  Discontinued     3.375 g 12.5 mL/hr over 240 Minutes Intravenous Every 8 hours 06/24/18 1603 06/26/18 0924   06/24/18 1115  cefoTEtan (CEFOTAN) 2 g in sodium chloride 0.9 % 100 mL IVPB     2 g 200 mL/hr  over 30 Minutes Intravenous On call to O.R. 06/24/18 1102 06/24/18 1237   06/24/18 0200  piperacillin-tazobactam (ZOSYN) IVPB 3.375 g  Status:  Discontinued     3.375 g 12.5 mL/hr over 240 Minutes Intravenous Every 8 hours 06/24/18 0110 06/24/18 1603   06/23/18 1730  piperacillin-tazobactam (ZOSYN) IVPB 3.375 g     3.375 g 100 mL/hr over 30 Minutes Intravenous  Once 06/23/18 1715 06/24/18 1133      Assessment/Plan: s/p Procedure(s): ACUTE GANGRENOUS APPENDICITIS WITH ABSCESS, PARTIAL SMALL BOWEL OBSTRUCTION, INCISIONAL HERNIA REPAIR, LYSIS OF ADHESIONS (N/A)  Ileus resolving Will try full liquids WBC trending down.  Continue IV antibiotics  LOS: 5 days    Coralie Keens 06/28/2018

## 2018-06-28 NOTE — Progress Notes (Signed)
Occupational Therapy Treatment Patient Details Name: Latoya Curry MRN: [id number] DOB: Apr 17, 1930 Today's Date: 06/28/2018    History of present illness 85 yo female admitted to ED on 4/29 with abdominal pain and fever. Covid -. Pt s/p lap appendectomy, incisional hernia repair, and lysis of adhesions 4/30. PMH includes LBP, diverticulitis, DJD, HTN, obesity, osteopenia, recurrent UTIs, transient global amnesia, bilateral TKR, ascending thoracic aortic aneurysm.    OT comments  Pt did well with OT this session  Follow Up Recommendations  Home health OT;Supervision - Intermittent    Equipment Recommendations  3 in 1 bedside commode    Recommendations for Other Services      Precautions / Restrictions Precautions Precautions: Fall Precaution Comments: recent ABD surgery/drain Restrictions Weight Bearing Restrictions: No       Mobility Bed Mobility Overal bed mobility: Needs Assistance Bed Mobility: Rolling;Sidelying to Sit Rolling: Min assist     Sit to supine: Min assist   General bed mobility comments: assist upper body due recent ABD surgery as well as increased time   Transfers Overall transfer level: Needs assistance Equipment used: Rolling walker (2 wheeled) Transfers: Sit to/from [facility] Sit to Stand: Min guard;Min assist Stand pivot transfers: Min assist            Balance Overall balance assessment: Needs assistance Sitting-balance support: No upper extremity supported;Feet supported Sitting balance-Leahy Scale: Good     Standing balance support: Bilateral upper extremity supported;During functional activity Standing balance-Leahy Scale: Fair Standing balance comment: able to stand without UE support, requires RW for functional tasks                           ADL either performed or assessed with clinical judgement   ADL Overall ADL's : Needs assistance/impaired                         Toilet Transfer:  Minimal assistance;BSC;RW;Stand-pivot   Toileting- Clothing Manipulation and Hygiene: Sitting/lateral lean;Sit to/from stand;Min guard                              Pertinent Vitals/ Pain       Pain Assessment: Faces Pain Score: 3  Pain Location: abdomen, with movement Pain Descriptors / Indicators: Dull     Prior Functioning/Environment              Frequency  Min 2X/week        Progress Toward Goals  OT Goals(current goals can now be found in the care plan section)  Progress towards OT goals: Progressing toward goals     Plan Discharge plan remains appropriate    Co-evaluation                 AM-PAC OT "6 Clicks" Daily Activity     Outcome Measure   Help from another person eating meals?: None Help from another person taking care of personal grooming?: None Help from another person toileting, which includes using toliet, bedpan, or urinal?: A Little Help from another person bathing (including washing, rinsing, drying)?: A Little Help from another person to put on and taking off regular upper body clothing?: None Help from another person to put on and taking off regular lower body clothing?: A Little 6 Click Score: 21    End of Session Equipment Utilized During Treatment: Gait belt;Rolling walker  OT Visit Diagnosis: Other  abnormalities of gait and mobility (R26.89);Muscle weakness (generalized) (M62.81);Pain Pain - part of body: (abdomen)   Activity Tolerance Patient tolerated treatment well   Patient Left in bed;with call bell/phone within reach   Nurse Communication Mobility status;Other (comment)(requesting assist getting changed)        Time: 6644-0347 OT Time Calculation (min): 15 min  Charges: OT General Charges $OT Visit: 1 Visit OT Treatments $Self Care/Home Management : 8-22 mins  Kari Baars, [place] [phone] Office- [phone], [place] 06/28/2018, 5:57  PM

## 2018-06-28 NOTE — Progress Notes (Signed)
Patients rectal tube/pouch came out this AM. MD paged. Will leave tube out and continue to monitor.

## 2018-06-28 NOTE — Progress Notes (Signed)
PROGRESS NOTE    Latoya Curry  OEV:[id number] DOB: [birth year] DOA: 06/23/2018 PCP: Josetta Huddle, MD   Brief Narrative:  HPI on 06/24/2018 Latoya Curry is a 85 y.o. female with medical history significant for hypertension, hyperlipidemia, ascending thoracic aortic aneurysm, hyperthyroidism s/p RAI therapy, and anxiety who presented to [facility] ED with 2 days of lower abdominal pain, nausea without emesis, and intermittent fevers.  Her symptoms persisted and she had a telemetry visit with her PCP who recommended she be evaluated in the ED due to a prior history of diverticulitis.  Patient otherwise denies any diaphoresis, chest pain, palpitations, dyspnea, diarrhea, dysuria or urinary frequency.  Interim history Patient admitted for acute appendicitis.  Status post surgery on 06/24/2018.  Currently has NG tube in place, pending further recommendations from general surgery.  Assessment & Plan   Sepsis secondary to Acute appendicitis vs UTI -Sepsis present on admission, as patient presented with fever, tachypnea, leukocytosis -Urine was cloudy in appearance, many bacteria, positive nitrites, moderate leukocytes,>50WBC (however patient states that she has not had any burning or pain with urination) -CT abdomen pelvis showed acute uncomplicated appendicitis -Urine culture >100k GNR- EColi Resistant to Cipro, Zosyn, ampicillin -Blood culture showed no growth to date -Initially placed on Zosyn, however given urine culture results placed her on ceftriaxone and Flagyl -Surgery consulted and appreciated, status post laparoscopic lysis of adhesions, laparoscopic appendectomy, primary incisional hernia repair.  Found to have a gangrenous appendicitis on partial small bowel obstruction.  NG tube was placed.  General surgery feels that patient is at risk for postoperative ileus and will likely need to be hospitalized for several days, continue antibiotics for 5 days. -Leukocytosis  improving -NG tube removed, patient did tolerate clear liquid diet well. -General surgery advancing diet to full liquids today.  Hypokalemia -Despite replacement, potassium still 3, will continue to monitor replace and monitor -Was also given magnesium replacement on 06/27/2018, magnesium currently 1.8  Asymptomatic bacteriuria -See discussion as listed above -Urine culture >100k GNR-E. coli -Discontinued Zosyn -Continue ceftriaxone and Flagyl given results of urine culture  Essential hypertension -Blood pressure stable -Home medications held including amlodipine and atenolol  -Continue IV hydralazine as needed  Ascending thoracic aortic aneurysm -Patient follows with cardiothoracic surgery, Dr. Prescott Gum.  Stable, 5.4 cm. -Continue medical therapy with blood pressure control  Hyperthyroidism -Underwent radioactive iodine therapy in January 2020 and is currently followed by her PCP as an outpatient.  Currently not on any type of replacement therapy.  Anxiety/depression -Continue Celexa  Hyperlipidemia -Continue statin  DVT Prophylaxis  SCDs  Code Status: Full  Family Communication: None at bedside  Disposition Plan: Admitted.  Patient progressing.  Suspect home with home health physical therapy in 1 to 2 days, pending further surgical recommendations.  Consultants General surgery  Procedures  laparoscopic lysis of adhesions, laparoscopic appendectomy, primary incisional hernia repair  Antibiotics   Anti-infectives (From admission, onward)   Start     Dose/Rate Route Frequency Ordered Stop   06/26/18 1000  cefTRIAXone (ROCEPHIN) 2 g in sodium chloride 0.9 % 100 mL IVPB     2 g 200 mL/hr over 30 Minutes Intravenous Every 24 hours 06/26/18 0924     06/26/18 1000  metroNIDAZOLE (FLAGYL) IVPB 500 mg     500 mg 100 mL/hr over 60 Minutes Intravenous Every 8 hours 06/26/18 0924     06/24/18 1800  piperacillin-tazobactam (ZOSYN) IVPB 3.375 g  Status:  Discontinued      3.375 g  12.5 mL/hr over 240 Minutes Intravenous Every 8 hours 06/24/18 1603 06/26/18 0924   06/24/18 1115  cefoTEtan (CEFOTAN) 2 g in sodium chloride 0.9 % 100 mL IVPB     2 g 200 mL/hr over 30 Minutes Intravenous On call to O.R. 06/24/18 1102 06/24/18 1237   06/24/18 0200  piperacillin-tazobactam (ZOSYN) IVPB 3.375 g  Status:  Discontinued     3.375 g 12.5 mL/hr over 240 Minutes Intravenous Every 8 hours 06/24/18 0110 06/24/18 1603   06/23/18 1730  piperacillin-tazobactam (ZOSYN) IVPB 3.375 g     3.375 g 100 mL/hr over 30 Minutes Intravenous  Once 06/23/18 1715 06/24/18 1133      Subjective:   Latoya Curry seen and examined today.  Patient denies current chest pain, shortness of breath, abdominal pain, nausea or vomiting.  She is having gas passage and bowel movements.  She denies any dizziness or headache. Objective:   Vitals:   06/27/18 1338 06/27/18 2131 06/28/18 0148 06/28/18 0656  BP: (!) 145/85 (!) 156/82  (!) 152/85  Pulse: (!) 110 92  85  Resp: 14 16  15   Temp: 99.2 F (37.3 C) 99.3 F (37.4 C)  98.6 F (37 C)  TempSrc: Oral Oral  Oral  SpO2: 92% 92%  95%  Weight:   73.4 kg   Height:        Intake/Output Summary (Last 24 hours) at 06/28/2018 1035 Last data filed at 06/28/2018 4166 Gross per 24 hour  Intake 2085.81 ml  Output 1685 ml  Net 400.81 ml   Filed Weights   06/26/18 0533 06/27/18 0719 06/28/18 0148  Weight: 72.3 kg 73.3 kg 73.4 kg   Exam  General: Well developed, well nourished, NAD, appears stated age  64: NCAT, mucous membranes moist.   Neck: Supple  Cardiovascular: S1 S2 auscultated, RRR, no murmur appreciated  Respiratory: Clear to auscultation bilaterally with equal chest rise  Abdomen: Soft, nontender, nondistended, + bowel sounds  Extremities: warm dry without cyanosis clubbing or edema  Neuro: AAOx3, nonfocal  Psych: Appropriate mood and affect, pleasant  Data Reviewed: I have personally reviewed following labs and imaging  studies  CBC: Recent Labs  Lab 06/23/18 1719 06/24/18 0848 06/25/18 0327 06/26/18 0312 06/27/18 0420 06/28/18 0355  WBC 24.8* 20.5* 12.9* 12.2* 11.6* 10.8*  NEUTROABS 22.8*  --   --   --   --   --   HGB 15.1* 14.5 12.7 11.7* 11.7* 12.1  HCT 45.9 45.6 39.5 37.1 36.2 36.8  MCV 89.8 94.4 93.4 93.2 93.1 91.5  PLT 239 230 193 225 244 063   Basic Metabolic Panel: Recent Labs  Lab 06/23/18 1719 06/24/18 0848 06/25/18 0327 06/26/18 0312 06/27/18 0420 06/28/18 0355  NA 133* 136 137 137 137 136  K 2.6* 3.6 2.8* 3.6 3.0* 3.0*  CL 95* 103 102 98 98 100  CO2 25 22 24 29 27 29   GLUCOSE 130* 101* 100* 82 67* 109*  BUN 29* 32* 30* 24* 18 10  CREATININE 0.88 1.01* 1.02* 0.80 0.71 0.65  CALCIUM 9.2 9.0 8.6* 8.7* 8.6* 8.4*  MG 1.7 2.3 2.1  --  1.7 1.8   GFR: Estimated Creatinine Clearance: 51.7 mL/min (by C-G formula based on SCr of 0.65 mg/dL). Liver Function Tests: Recent Labs  Lab 06/23/18 1719  AST 29  ALT 16  ALKPHOS 58  BILITOT 1.7*  PROT 7.6  ALBUMIN 4.0   No results for input(s): LIPASE, AMYLASE in the last 168 hours. No results for input(s): AMMONIA  in the last 168 hours. Coagulation Profile: No results for input(s): INR, PROTIME in the last 168 hours. Cardiac Enzymes: No results for input(s): CKTOTAL, CKMB, CKMBINDEX, TROPONINI in the last 168 hours. BNP (last 3 results) No results for input(s): PROBNP in the last 8760 hours. HbA1C: No results for input(s): HGBA1C in the last 72 hours. CBG: No results for input(s): GLUCAP in the last 168 hours. Lipid Profile: No results for input(s): CHOL, HDL, LDLCALC, TRIG, CHOLHDL, LDLDIRECT in the last 72 hours. Thyroid Function Tests: No results for input(s): TSH, T4TOTAL, FREET4, T3FREE, THYROIDAB in the last 72 hours. Anemia Panel: No results for input(s): VITAMINB12, FOLATE, FERRITIN, TIBC, IRON, RETICCTPCT in the last 72 hours. Urine analysis:    Component Value Date/Time   COLORURINE YELLOW 06/23/2018 1719    APPEARANCEUR CLOUDY (A) 06/23/2018 1719   LABSPEC >1.030 (H) 06/23/2018 1719   PHURINE 5.5 06/23/2018 1719   GLUCOSEU NEGATIVE 06/23/2018 1719   HGBUR SMALL (A) 06/23/2018 1719   BILIRUBINUR NEGATIVE 06/23/2018 1719   KETONESUR NEGATIVE 06/23/2018 1719   PROTEINUR NEGATIVE 06/23/2018 1719   UROBILINOGEN 0.2 01/08/2007 2322   NITRITE POSITIVE (A) 06/23/2018 1719   LEUKOCYTESUR MODERATE (A) 06/23/2018 1719   Sepsis Labs: @LABRCNTIP (procalcitonin:4,lacticidven:4)  ) Recent Results (from the past 240 hour(s))  Urine culture     Status: Abnormal   Collection Time: 06/23/18  5:19 PM  Result Value Ref Range Status   Specimen Description   Final    URINE, RANDOM Performed at [facility], [address] [place], [place] [address]    Special Requests   Final    NONE Performed at [facility], [address] [place], [place] [address]    Culture >=100,000 COLONIES/mL ESCHERICHIA COLI (A)  Final   Report Status 06/26/2018 FINAL  Final   Organism ID, Bacteria ESCHERICHIA COLI (A)  Final      Susceptibility   Escherichia coli - MIC*    AMPICILLIN >=32 RESISTANT Resistant     CEFAZOLIN 32 INTERMEDIATE Intermediate     CEFTRIAXONE <=1 SENSITIVE Sensitive     CIPROFLOXACIN >=4 RESISTANT Resistant     GENTAMICIN <=1 SENSITIVE Sensitive     IMIPENEM <=0.25 SENSITIVE Sensitive     NITROFURANTOIN <=16 SENSITIVE Sensitive     TRIMETH/SULFA <=20 SENSITIVE Sensitive     AMPICILLIN/SULBACTAM >=32 RESISTANT Resistant     PIP/TAZO >=128 RESISTANT Resistant     Extended ESBL NEGATIVE Sensitive     * >=100,000 COLONIES/mL ESCHERICHIA COLI  Blood culture (routine x 2)     Status: None (Preliminary result)   Collection Time: 06/23/18  5:20 PM  Result Value Ref Range Status   Specimen Description   Final    BLOOD BLOOD LEFT FOREARM Performed at [facility], [address] [place], [place] [address]    Special Requests   Final    BOTTLES DRAWN AEROBIC AND  ANAEROBIC Blood Culture adequate volume Performed at [facility], [address] [place], [place] [address]    Culture  Setup Time   Final    GRAM POSITIVE RODS ANAEROBIC BOTTLE ONLY CRITICAL RESULT CALLED TO, READ BACK BY AND VERIFIED WITHKeturah Barre [facility] PHARMD 1933 06/25/18 A BROWNING Performed at [facility], [address], [place], [place] [address]    Culture GRAM POSITIVE RODS  Final   Report Status PENDING  Incomplete  Blood culture (routine x 2)     Status: None (  Preliminary result)   Collection Time: 06/23/18  6:10 PM  Result Value Ref Range Status   Specimen Description   Final    BLOOD BLOOD RIGHT ARM Performed at [facility], [address] [place], [place] [address]    Special Requests   Final    BOTTLES DRAWN AEROBIC AND ANAEROBIC Blood Culture adequate volume Performed at [facility], [address] [place], [place] [address]    Culture  Setup Time   Final    GRAM POSITIVE RODS ANAEROBIC BOTTLE ONLY CRITICAL VALUE NOTED.  VALUE IS CONSISTENT WITH PREVIOUSLY REPORTED AND CALLED VALUE. Performed at [facility], [place] [address], [place], [place] [address]    Culture GRAM POSITIVE RODS  Final   Report Status PENDING  Incomplete  SARS Coronavirus 2 [facility] order, Performed in [place] hospital lab)     Status: None   Collection Time: 06/23/18  7:14 PM  Result Value Ref Range Status   SARS Coronavirus 2 NEGATIVE NEGATIVE Final    Comment: (NOTE) If result is NEGATIVE SARS-CoV-2 target nucleic acids are NOT DETECTED. The SARS-CoV-2 RNA is generally detectable in upper and lower  respiratory specimens during the acute phase of infection. The lowest  concentration of SARS-CoV-2 viral copies this assay can detect is 250  copies / mL. A negative result does not preclude SARS-CoV-2 infection  and should not be used as the sole basis for treatment or other  patient management decisions.  A negative result may occur with   improper specimen collection / handling, submission of specimen other  than nasopharyngeal swab, presence of viral mutation(s) within the  areas targeted by this assay, and inadequate number of viral copies  (<250 copies / mL). A negative result must be combined with clinical  observations, patient history, and epidemiological information. If result is POSITIVE SARS-CoV-2 target nucleic acids are DETECTED. The SARS-CoV-2 RNA is generally detectable in upper and lower  respiratory specimens dur ing the acute phase of infection.  Positive  results are indicative of active infection with SARS-CoV-2.  Clinical  correlation with patient history and other diagnostic information is  necessary to determine patient infection status.  Positive results do  not rule out bacterial infection or co-infection with other viruses. If result is PRESUMPTIVE POSTIVE SARS-CoV-2 nucleic acids MAY BE PRESENT.   A presumptive positive result was obtained on the submitted specimen  and confirmed on repeat testing.  While 2019 novel coronavirus  (SARS-CoV-2) nucleic acids may be present in the submitted sample  additional confirmatory testing may be necessary for epidemiological  and / or clinical management purposes  to differentiate between  SARS-CoV-2 and other Sarbecovirus currently known to infect humans.  If clinically indicated additional testing with an alternate test  methodology [phone]) is advised. The SARS-CoV-2 RNA is generally  detectable in upper and lower respiratory sp ecimens during the acute  phase of infection. The expected result is Negative. Fact Sheet for Patients:  StrictlyIdeas.no Fact Sheet for Healthcare Providers: BankingDealers.co.za This test is not yet approved or cleared by the [place] FDA and has been authorized for detection and/or diagnosis of SARS-CoV-2 by FDA under an Emergency Use Authorization (EUA).  This EUA will  remain in effect (meaning this test can be used) for the duration of the COVID-19 declaration under Section 564(b)(1) of the Act, 21 U.S.C. section 360bbb-3(b)(1), unless the authorization is terminated or revoked sooner. Performed at [facility], [place]  [address] [place], [place] [address]   MRSA PCR Screening     Status: None   Collection Time: 06/24/18  2:11 AM  Result Value Ref Range Status   MRSA by PCR NEGATIVE NEGATIVE Final    Comment:        The GeneXpert MRSA Assay (FDA approved for NASAL specimens only), is one component of a comprehensive MRSA colonization surveillance program. It is not intended to diagnose MRSA infection nor to guide or monitor treatment for MRSA infections. Performed at [facility], [place] [address], [place], [place] [address]       Radiology Studies: No results found.   Scheduled Meds: . lip balm  1 application Topical BID  . rosuvastatin  5 mg Oral Once per day on Mon Wed Fri   Continuous Infusions: . cefTRIAXone (ROCEPHIN)  IV 2 g (06/27/18 1022)  . lactated ringers 75 mL/hr at 06/28/18 0600  . methocarbamol (ROBAXIN) IV    . metronidazole 500 mg (06/28/18 0906)  . ondansetron (ZOFRAN) IV    . potassium chloride       LOS: 5 days   Time Spent in minutes   30 minutes  Stephens Shreve D.O. on 06/28/2018 at 10:35 AM  Between 7am to 7pm - Please see pager noted on [email or web address]  After 7pm go to [email or web address]  And look for the night coverage person covering for me after hours  [facility] Office  [phone]

## 2018-06-29 LAB — BASIC METABOLIC PANEL
Anion gap: 9 (ref 5–15)
BUN: 6 mg/dL — ABNORMAL LOW (ref 8–23)
CO2: 24 mmol/L (ref 22–32)
Calcium: 8.5 mg/dL — ABNORMAL LOW (ref 8.9–10.3)
Chloride: 105 mmol/L (ref 98–111)
Creatinine, Ser: 0.7 mg/dL (ref 0.44–1.00)
GFR calc Af Amer: >60 mL/min (ref >60)
GFR calc non Af Amer: >60 mL/min (ref >60)
Glucose, Bld: 116 mg/dL — ABNORMAL HIGH (ref 70–99)
Potassium: 3.6 mmol/L (ref 3.5–5.1)
Sodium: 138 mmol/L (ref 135–145)

## 2018-06-29 LAB — CBC
HCT: 37.4 % (ref 36.0–46.0)
Hemoglobin: 11.7 g/dL — ABNORMAL LOW (ref 12.0–15.0)
MCH: 29 pg (ref 26.0–34.0)
MCHC: 31.3 g/dL (ref 30.0–36.0)
MCV: 92.6 fL (ref 80.0–100.0)
Platelets: 301 10*3/uL (ref 150–400)
RBC: 4.04 MIL/uL (ref 3.87–5.11)
RDW: 14.3 % (ref 11.5–15.5)
WBC: 10.4 10*3/uL (ref 4.0–10.5)
nRBC: 0 % (ref 0.0–0.2)

## 2018-06-29 LAB — MAGNESIUM: Magnesium: 1.6 mg/dL — ABNORMAL LOW (ref 1.7–2.4)

## 2018-06-29 MED ORDER — PANTOPRAZOLE SODIUM 40 MG PO TBEC
40.0000 mg | DELAYED_RELEASE_TABLET | Freq: Every day | ORAL | Status: DC
  Administered 2018-06-29 – 2018-07-03 (×5): 40 mg via ORAL
  Filled 2018-06-29 (×5): qty 1

## 2018-06-29 MED ORDER — HYDROCHLOROTHIAZIDE 25 MG PO TABS
25.0000 mg | ORAL_TABLET | Freq: Every day | ORAL | Status: DC
  Administered 2018-06-29 – 2018-07-03 (×5): 25 mg via ORAL
  Filled 2018-06-29 (×5): qty 1

## 2018-06-29 MED ORDER — ATENOLOL-CHLORTHALIDONE 50-25 MG PO TABS: 1.0000 | ORAL_TABLET | Freq: Every day | ORAL | Status: DC

## 2018-06-29 MED ORDER — AMLODIPINE BESYLATE 5 MG PO TABS
2.5000 mg | ORAL_TABLET | Freq: Every day | ORAL | Status: DC
  Administered 2018-06-29 – 2018-07-03 (×5): 2.5 mg via ORAL
  Filled 2018-06-29 (×5): qty 1

## 2018-06-29 MED ORDER — MAGNESIUM SULFATE 2 GM/50ML IV SOLN
2.0000 g | Freq: Once | INTRAVENOUS | Status: AC
  Administered 2018-06-29: 2 g via INTRAVENOUS
  Filled 2018-06-29: qty 50

## 2018-06-29 MED ORDER — ATENOLOL 50 MG PO TABS
50.0000 mg | ORAL_TABLET | Freq: Every day | ORAL | Status: DC
  Administered 2018-06-29 – 2018-07-03 (×5): 50 mg via ORAL
  Filled 2018-06-29 (×5): qty 1

## 2018-06-29 NOTE — Progress Notes (Signed)
Updated daughter, Tauheedah Bok, [phone] about patient's progress and possible PM discharge.   Jackson Latino, [facility] Pager [phone]

## 2018-06-29 NOTE — Progress Notes (Signed)
[facility]/Trauma Progress Note  5 Days Post-Op   Assessment/Plan Principal Problem:   Acute gangrenous appendicitis s/p lap appendectomy 06/24/2018 Active Problems:   Essential hypertension   Hypokalemia   Small bowel obstruction, partial, s/p lap adhesiolysis 06/24/2018   Incarcerated incisional hernia s/p primary repair 06/24/2018  Gangrenous appendicitis - S/P lap appy, LOA, primary incisional hernia repair, 04/30, Dr. Johney Maine Postop Ileus - improving and pt is tolerating diet, having bowel function  FEN: soft diet  VTE: SCD's, okay for lovenox from our standpoint, will defer to medicine ID: Zosyn 04/29-05/02; Rocephin & Flagyl 05/02>> Foley: none Follow up: Dr. Johney Maine  DISPO: If pt tolerates her soft diet today she will be okay for discharge from our standpoint. She will need a total of 10-14 days of antibiotics.     LOS: 6 days    Subjective: CC: no complaints  Pt is having loose stools and flatus. No nausea, vomiting, fever or chills. She is tolerating a FLD. No issues overnight.   Objective: Vital signs in last 24 hours: Temp:  [98.3 F (36.8 C)-98.5 F (36.9 C)] 98.4 F (36.9 C) (05/05 0545) Pulse Rate:  [75-98] 97 (05/05 0545) Resp:  [16-24] 24 (05/05 0545) BP: (146-159)/(77-93) 147/91 (05/05 0545) SpO2:  [95 %-97 %] 95 % (05/05 0545) Weight:  [72.8 kg] 72.8 kg (05/05 0541) Last BM Date: 06/28/18  Intake/Output from previous day: 05/04 0701 - 05/05 0700 In: 2820.7 [P.O.:985; I.V.:1277.8; IV Piggyback:557.9] Out: 61 [Urine:1; Drains:60] Intake/Output this shift: Total I/O In: -  Out: 5 [Drains:5]  PE: Gen:  Alert, NAD, pleasant, cooperative Pulm:  Rate and effort normal Abd: Soft, NT/ND, +BS, incisions C/D/I, drain with minimal serosanguinous drainage,no peritonitis  Skin: no rashes noted, warm and dry   Anti-infectives: Anti-infectives (From admission, onward)   Start     Dose/Rate Route Frequency Ordered Stop   06/26/18 1000   cefTRIAXone (ROCEPHIN) 2 g in sodium chloride 0.9 % 100 mL IVPB     2 g 200 mL/hr over 30 Minutes Intravenous Every 24 hours 06/26/18 0924     06/26/18 1000  metroNIDAZOLE (FLAGYL) IVPB 500 mg     500 mg 100 mL/hr over 60 Minutes Intravenous Every 8 hours 06/26/18 0924     06/24/18 1800  piperacillin-tazobactam (ZOSYN) IVPB 3.375 g  Status:  Discontinued     3.375 g 12.5 mL/hr over 240 Minutes Intravenous Every 8 hours 06/24/18 1603 06/26/18 0924   06/24/18 1115  cefoTEtan (CEFOTAN) 2 g in sodium chloride 0.9 % 100 mL IVPB     2 g 200 mL/hr over 30 Minutes Intravenous On call to O.R. 06/24/18 1102 06/24/18 1237   06/24/18 0200  piperacillin-tazobactam (ZOSYN) IVPB 3.375 g  Status:  Discontinued     3.375 g 12.5 mL/hr over 240 Minutes Intravenous Every 8 hours 06/24/18 0110 06/24/18 1603   06/23/18 1730  piperacillin-tazobactam (ZOSYN) IVPB 3.375 g     3.375 g 100 mL/hr over 30 Minutes Intravenous  Once 06/23/18 1715 06/24/18 1133      Lab Results:  Recent Labs    06/28/18 0355 06/29/18 0307  WBC 10.8* 10.4  HGB 12.1 11.7*  HCT 36.8 37.4  PLT 272 301   BMET Recent Labs    06/28/18 0355 06/29/18 0307  NA 136 138  K 3.0* 3.6  CL 100 105  CO2 29 24  GLUCOSE 109* 116*  BUN 10 6*  CREATININE 0.65 0.70  CALCIUM 8.4* 8.5*   PT/INR No results for input(s):  LABPROT, INR in the last 72 hours. CMP     Component Value Date/Time   NA 138 06/29/2018 0307   NA 144 08/25/2017 0910   K 3.6 06/29/2018 0307   CL 105 06/29/2018 0307   CO2 24 06/29/2018 0307   GLUCOSE 116 (H) 06/29/2018 0307   BUN 6 (L) 06/29/2018 0307   BUN 25 08/25/2017 0910   CREATININE 0.70 06/29/2018 0307   CALCIUM 8.5 (L) 06/29/2018 0307   PROT 7.6 06/23/2018 1719   PROT 7.1 08/25/2017 0910   ALBUMIN 4.0 06/23/2018 1719   ALBUMIN 4.4 08/25/2017 0910   AST 29 06/23/2018 1719   ALT 16 06/23/2018 1719   ALKPHOS 58 06/23/2018 1719   BILITOT 1.7 (H) 06/23/2018 1719   BILITOT 0.6 08/25/2017 0910    GFRNONAA >60 06/29/2018 0307   GFRAA >60 06/29/2018 0307   Lipase  No results found for: LIPASE  Studies/Results: No results found.    Kalman Drape , [facility] 06/29/2018, 8:46 AM  Pager: [phone] Mon-Wed, Friday 7:00am-4:30pm Thurs 7am-11:30am  Consults: [phone]

## 2018-06-29 NOTE — Progress Notes (Signed)
Occupational Therapy Treatment Patient Details Name: Latoya Curry MRN: [id number] DOB: 25-Jun-1930 Today's Date: 06/29/2018    History of present illness 85 yo female admitted to ED on 4/29 with abdominal pain and fever. Covid -. Pt s/p lap appendectomy, incisional hernia repair, and lysis of adhesions 4/30. PMH includes LBP, diverticulitis, DJD, HTN, obesity, osteopenia, recurrent UTIs, transient global amnesia, bilateral TKR, ascending thoracic aortic aneurysm.    OT comments  Performed ADL from bathroom. Pt with loose stool/incontinence when walking to bathroom. RN aware; used mesh panties and 2 pads. Pt needs min guard to min A to stand and min to mod A for adls. Has 2 daughters near her. Will continue to follow.  Follow Up Recommendations  Home health OT;Supervision - Intermittent(for mobility/ adls)    Equipment Recommendations  3 in 1 bedside commode    Recommendations for Other Services      Precautions / Restrictions Precautions Precautions: Fall Precaution Comments: recent ABD surgery/drain Restrictions Weight Bearing Restrictions: No       Mobility Bed Mobility           Sit to supine: Mod assist   General bed mobility comments: for bil LEs via sidelying  Transfers   Equipment used: Rolling walker (2 wheeled)   Sit to Stand: Min assist;Min guard         General transfer comment: min A from recliner; min guard from 3:1 commode    Balance                                           ADL either performed or assessed with clinical judgement   ADL       Grooming: Oral care;Supervision/safety;Sitting   Upper Body Bathing: Sitting;Set up   Lower Body Bathing: Minimal assistance;Moderate assistance;Sit to/from stand   Upper Body Dressing : Minimal assistance;Sitting(lines/drain)   Lower Body Dressing: Moderate assistance;Sit to/from stand Lower Body Dressing Details (indicate cue type and reason): pt does not have AE but can get  it Toilet Transfer: Minimal assistance;Ambulation;BSC   Toileting- Clothing Manipulation and Hygiene: Minimal assistance;Moderate assistance;Sit to/from stand         General ADL Comments: ambulated to bathroom, used toilet and completed ADL from there.  Pt requested to rest in bed after this:  she will get up again for lunch with assistance. Pt had some runny bowel incontinence prior to making it to toilet.  BM in toilet looks formed: RN to check.  Donned mesh underwear and 2 pads     Vision       Perception     Praxis      Cognition Arousal/Alertness: Awake/alert Behavior During Therapy: WFL for tasks assessed/performed                                   General Comments: mostly wfls; asked if she had a new gown after performing adl in bathroom        Exercises     Shoulder Instructions       General Comments JP drain    Pertinent Vitals/ Pain       Pain Assessment: Faces Faces Pain Scale: Hurts a little bit Pain Location: abdomen, with movement Pain Intervention(s): Limited activity within patient's tolerance;Monitored during session;Premedicated before session;Repositioned  Home Living  Prior Functioning/Environment              Frequency  Min 2X/week        Progress Toward Goals  OT Goals(current goals can now be found in the care plan section)  Progress towards OT goals: Progressing toward goals     Plan      Co-evaluation                 AM-PAC OT "6 Clicks" Daily Activity     Outcome Measure   Help from another person eating meals?: None Help from another person taking care of personal grooming?: A Little Help from another person toileting, which includes using toliet, bedpan, or urinal?: A Little Help from another person bathing (including washing, rinsing, drying)?: A Little Help from another person to put on and taking off regular upper body clothing?:  A Little Help from another person to put on and taking off regular lower body clothing?: A Lot 6 Click Score: 18    End of Session    OT Visit Diagnosis: Other abnormalities of gait and mobility (R26.89);Muscle weakness (generalized) (M62.81);Pain   Activity Tolerance Patient tolerated treatment well   Patient Left in bed;with call bell/phone within reach;with bed alarm set   Nurse Communication          Time: 4709-2957 OT Time Calculation (min): 38 min  Charges: OT General Charges $OT Visit: 1 Visit OT Treatments $Self Care/Home Management : 38-52 mins  Lesle Chris, OTR/L Acute Rehabilitation Services [phone] [place] pager [phone] office 06/29/2018   [place] 06/29/2018, 11:27 AM

## 2018-06-29 NOTE — Care Management Important Message (Signed)
Important Message  Patient Details IM Letter given to Velva Harman RN to present to the Patient Name: Latoya Curry MRN: [id number] Date of Birth: [birth year]   Medicare Important Message Given:  Yes    Kerin Salen 06/29/2018, 10:55 AMImportant Message  Patient Details  Name: Latoya Curry MRN: [id number] Date of Birth: 1930-12-13   Medicare Important Message Given:  Yes    Kerin Salen 06/29/2018, 10:55 AM

## 2018-06-29 NOTE — Progress Notes (Signed)
Physical Therapy Treatment Patient Details Name: Latoya Curry MRN: [id number] DOB: Jan 31, 1931 Today's Date: 06/29/2018    History of Present Illness 85 yo female admitted to ED on 4/29 with abdominal pain and fever. Covid -. Pt s/p lap appendectomy, incisional hernia repair, and lysis of adhesions 4/30. PMH includes LBP, diverticulitis, DJD, HTN, obesity, osteopenia, recurrent UTIs, transient global amnesia, bilateral TKR, ascending thoracic aortic aneurysm.     PT Comments    Assisted out of recliner to amb to bathroom with walker.  Assisted with toilet transfer.  Pt able to self doff/don undergarment.  amb a functional distance in hallway.   Pt plans to D/C to home with help from daughters.  Pt does not typically use a walker for amb but agrees she needs to until she gets stronger.  Stated her spouse had one but not sure where it is. Pt will need a RW for D/C.   Follow Up Recommendations  Home health PT;Supervision for mobility/OOB     Equipment Recommendations  Rolling walker with 5" wheels    Recommendations for Other Services       Precautions / Restrictions Precautions Precautions: Fall Precaution Comments: recent ABD surgery/drain Restrictions Weight Bearing Restrictions: No    Mobility  Bed Mobility Overal bed mobility: Needs Assistance Bed Mobility: Sit to Supine       Sit to supine: Min assist   General bed mobility comments: assisted back to bed and positioned to comfort  Transfers Overall transfer level: Needs assistance Equipment used: Rolling walker (2 wheeled) Transfers: Sit to/from [facility] Sit to Stand: Supervision;Min guard Stand pivot transfers: Supervision;Min guard       General transfer comment: <25% VC's on safety with turns.  Also assisted to bathroom with toilet transfer.    Ambulation/Gait Ambulation/Gait assistance: Supervision;Min guard Gait Distance (Feet): 85 Feet Assistive device: Rolling walker (2  wheeled) Gait Pattern/deviations: Step-through pattern;Decreased stride length     General Gait Details: tolerated an increased distance with no loose stools.  Mild lean on walker for safety.     Stairs             Wheelchair Mobility    Modified Rankin (Stroke Patients Only)       Balance                                            Cognition Arousal/Alertness: Awake/alert Behavior During Therapy: WFL for tasks assessed/performed Overall Cognitive Status: Within Functional Limits for tasks assessed                                 General Comments: pleasant and motivated       Exercises      General Comments        Pertinent Vitals/Pain Pain Assessment: Faces Faces Pain Scale: Hurts a little bit Pain Location: abdomen, with movement Pain Descriptors / Indicators: Discomfort Pain Intervention(s): Monitored during session;Repositioned    Home Living                      Prior Function            PT Goals (current goals can now be found in the care plan section) Progress towards PT goals: Progressing toward goals    Frequency    Min 4X/week  PT Plan Current plan remains appropriate    Co-evaluation              AM-PAC PT "6 Clicks" Mobility   Outcome Measure  Help needed turning from your back to your side while in a flat bed without using bedrails?: A Little Help needed moving from lying on your back to sitting on the side of a flat bed without using bedrails?: A Little Help needed moving to and from a bed to a chair (including a wheelchair)?: A Little Help needed standing up from a chair using your arms (e.g., wheelchair or bedside chair)?: A Little Help needed to walk in hospital room?: A Little Help needed climbing 3-5 steps with a railing? : A Little 6 Click Score: 18    End of Session Equipment Utilized During Treatment: Gait belt Activity Tolerance: Patient tolerated treatment  well Patient left: in bed;with bed alarm set;with call bell/phone within reach Nurse Communication: Mobility status PT Visit Diagnosis: Other abnormalities of gait and mobility (R26.89);Pain;Muscle weakness (generalized) (M62.81) Pain - Right/Left: Right     Time: 0383-3383 PT Time Calculation (min) (ACUTE ONLY): 26 min  Charges:  $Gait Training: 8-22 mins $Therapeutic Activity: 8-22 mins                     Rica Koyanagi  PTA Acute  Rehabilitation Services Pager      [phone] Office      [phone]

## 2018-06-29 NOTE — Discharge Instructions (Signed)
[facility] ______[facility] °LAPAROSCOPIC SURGERY: POST OP INSTRUCTIONS °Always review your discharge instruction sheet given to you by the facility where your surgery was performed. °IF YOU HAVE DISABILITY OR FAMILY LEAVE FORMS, YOU MUST BRING THEM TO THE OFFICE FOR PROCESSING.   °DO NOT GIVE THEM TO YOUR DOCTOR. ° °1. A prescription for pain medication may be given to you upon discharge.  Take your pain medication as prescribed, if needed.  If narcotic pain medicine is not needed, then you may take acetaminophen (Tylenol) or ibuprofen (Advil) as needed. °2. Take your usually prescribed medications unless otherwise directed. °3. If you need a refill on your pain medication, please contact your pharmacy.  They will contact our office to request authorization. Prescriptions will not be filled after 5pm or on week-ends. °4. You should follow a light diet the first few days after arrival home, such as soup and crackers, etc.  Be sure to include lots of fluids daily. °5. Most patients will experience some swelling and bruising in the area of the incisions.  Ice packs will help.  Swelling and bruising can take several days to resolve.  °6. It is common to experience some constipation if taking pain medication after surgery.  Increasing fluid intake and taking a stool softener (such as Colace) will usually help or prevent this problem from occurring.  A mild laxative (Milk of Magnesia or Miralax) should be taken according to package instructions if there are no bowel movements after 48 hours. °7. Unless discharge instructions indicate otherwise, you may remove your bandages 24-48 hours after surgery, and you may shower at that time.  You may have steri-strips (small skin tapes) in place directly over the incision.  These strips should be left on the skin for 7-10 days.  If your surgeon used skin glue on the incision, you may shower in 24 hours.  The glue will flake off over the next 2-3 weeks.  Any sutures or  staples will be removed at the office during your follow-up visit. °8. ACTIVITIES:  You may resume regular (light) daily activities beginning the next day--such as daily self-care, walking, climbing stairs--gradually increasing activities as tolerated.  You may have sexual intercourse when it is comfortable.  Refrain from any heavy lifting or straining until approved by your doctor. °a. You may drive when you are no longer taking prescription pain medication, you can comfortably wear a seatbelt, and you can safely maneuver your car and apply brakes. °b. RETURN TO WORK:  __________________________________________________________ °9. You should see your doctor in the office for a follow-up appointment approximately 2-3 weeks after your surgery.  Make sure that you call for this appointment within a day or two after you arrive home to insure a convenient appointment time. °10. OTHER INSTRUCTIONS: __________________________________________________________________________________________________________________________ __________________________________________________________________________________________________________________________ °WHEN TO CALL YOUR DOCTOR: °1. Fever over 101.0 °2. Inability to urinate °3. Continued bleeding from incision. °4. Increased pain, redness, or drainage from the incision. °5. Increasing abdominal pain ° °The clinic staff is available to answer your questions during regular business hours.  Please don’t hesitate to call and ask to speak to one of the nurses for clinical concerns.  If you have a medical emergency, go to the nearest emergency room or call 911.  A surgeon from [facility] is always on call at the hospital. °[address], [address], [place], [place]  [address] ? [address] [address], [place],    [address] °[phone] ? [phone] ? FAX [phone] °Web site:   [email or web address]    Low-Fiber Eating Plan Follow for 3-5 days then eat a  high fiber diet Fiber is found in fruits, vegetables, whole grains, and beans. Eating a diet low in fiber helps to reduce how often you have bowel movements and how much you produce during a bowel movement. A low-fiber eating plan may help your digestive system heal if:  You have certain conditions, such as Crohn's disease or diverticulitis.  You recently had radiation therapy on your pelvis or bowel.  You recently had intestinal surgery.  You have a new surgical opening in your abdomen (colostomy or ileostomy).  Your intestine is narrowed (stricture). Your health care provider will determine how long you need to stay on this diet. Your health care provider may recommend that you work with a diet and nutrition specialist (dietitian). What are tips for following this plan? General guidelines  Follow recommendations from your dietitian about how much fiber you should have each day.  Most people on this eating plan should try to eat less than 10 grams (g) of fiber each day. Your daily fiber goal is _________________ g.  Take vitamin and mineral supplements as told by your health care provider or dietitian. Chewable or liquid forms are best when on this eating plan. Reading food labels  Check food labels for the amount of dietary fiber.  Choose foods that have less than 2 grams of fiber in one serving. Cooking  Use white flour and other allowed grains for baking and cooking.  Cook meat using methods that keep it tender, such as braising or poaching.  Cook eggs until the yolk is completely solid.  Cook with healthy oils, such as olive oil or canola oil. Meal planning   Eat 5-6 small meals throughout the day instead of 3 large meals.  If you are lactose intolerant: ? Choose low-lactose dairy foods. ? Do not eat dairy foods, if told by your dietitian.  Limit fat and oils to less than 8 teaspoons a day.  Eat small portions of desserts. What foods are allowed? The items listed  below may not be a complete list. Talk with your dietitian about what dietary choices are best for you. Grains All bread and crackers made with white flour. Waffles, pancakes, and [place] toast. Bagels. Pretzels. Melba toast, zwieback, and matzoh. Cooked and dried cereals that do not contain whole grains, added fiber, seeds, or dried fruit. CornmealDomenick Gong. Hot and cold cereals made with refined corn, wheat, rice, or oats. Plain pasta and noodles. White rice. Vegetables Well-cooked or canned vegetables without skin, seeds, or stems. Cooked potatoes without skins. Vegetable juice. Fruits Soft-cooked or canned fruits without skin and seeds. Peeled ripe banana. Applesauce. Fruit juice without pulp. Meats and other protein foods Ground meat. Tender cuts of meat or poultry. Eggs. Fish, seafood, and shellfish. Smooth nut butters. Tofu. Dairy All milk products and drinks. Lactose-free milks, including rice, soy, and almond milks. Yogurt without fruit, nuts, chocolate, or granola mix-ins. Sour cream. Cottage cheese. Cheese. Beverages Decaf coffee. Fruit and vegetable juices or smoothies (in small amounts, with no pulp or skins, and with fruits from allowed list). Sports drinks. Herbal tea. Fats and oils Olive oil, canola oil, sunflower oil, flaxseed oil, and grapeseed oil. Mayonnaise. Cream cheese. Margarine. Butter. Sweets and desserts Plain cakes and cookies. Cream pies and pies made with allowed fruits. Pudding. Custard. Fruit gelatin. Sherbet. Popsicles. Ice cream without nuts. Plain hard candy. Honey. Jelly. Molasses. Syrups, including chocolate syrup. Chocolate. Marshmallows. Gumdrops. Seasoning  and other foods Bouillon. Broth. Cream soups made from allowed foods. Strained soup. Casseroles made with allowed foods. Ketchup. Mild mustard. Mild salad dressings. Plain gravies. Vinegar. Spices in moderation. Salt. Sugar. What foods are not allowed? The items listed below may not be a complete list. Talk  with your dietitian about what dietary choices are best for you. Grains Whole wheat and whole grain breads and crackers. Multigrain breads and crackers. Rye bread. Whole grain or multigrain cereals. Cereals with nuts, raisins, or coconut. Bran. Coarse wheat cereals. Granola. High-fiber cereals. Cornmeal or corn bread. Whole grain pasta. Wild or brown rice. Quinoa. Popcorn. Buckwheat. Wheat germ. Vegetables Potato skins. Raw or undercooked vegetables. All beans and bean sprouts. Cooked greens. Corn. Peas. Cabbage. Beets. Broccoli. Brussels sprouts. Cauliflower. Mushrooms. Onions. Peppers. Parsnips. Okra. Sauerkraut. Fruit Raw or dried fruit. Berries. Fruit juice with pulp. Prune juice. Meats and other protein foods Tough, fibrous meats with gristle. Fatty meat. Poultry with skin. Fried meat, Sales executive, or fish. Deli or lunch meats. Sausage, bacon, and hot dogs. Nuts and chunky nut butter. Dried peas, beans, and lentils. Dairy Yogurt with fruit, nuts, chocolate, or granola mix-ins. Beverages Caffeinated coffee and teas. Fats and oils Avocado. Coconut. Sweets and desserts Desserts, cookies, or candies that contain nuts or coconut. Dried fruit. Jams and preserves with seeds. Marmalade. Any dessert made with fruits or grains that are not allowed. Seasoning and other foods Corn tortilla chips. Soups made with vegetables or grains that are not allowed. Relish. Horseradish. Angie Fava. Olives. Summary  Most people on a low-fiber eating plan should eat less than 10 grams of fiber a day. Follow recommendations from your dietitian about how much fiber you should have each day.  Always check food labels to see the dietary fiber content of packaged foods. In general, a low-fiber food will have fewer than 2 grams of fiber per serving.  In general, try to avoid whole grains, raw fruits and vegetables, dried fruit, tough cuts of meat, nuts, and seeds.  Take a vitamin and mineral supplement as told by your  health care provider or dietitian. This information is not intended to replace advice given to you by your health care provider. Make sure you discuss any questions you have with your health care provider. Document Released: 08/02/2001 Document Revised: 04/15/2016 Document Reviewed: 04/15/2016 Elsevier Interactive Patient Education  2019 [employer].

## 2018-06-29 NOTE — Progress Notes (Signed)
PROGRESS NOTE    Latoya Curry  GLO:[id number] DOB: Aug 03, 1930 DOA: 06/23/2018 PCP: Josetta Huddle, MD   Brief Narrative:  HPI on 06/24/2018 Latoya Curry is a 85 y.o. female with medical history significant for hypertension, hyperlipidemia, ascending thoracic aortic aneurysm, hyperthyroidism s/p RAI therapy, and anxiety who presented to [facility] ED with 2 days of lower abdominal pain, nausea without emesis, and intermittent fevers.  Her symptoms persisted and she had a telemetry visit with her PCP who recommended she be evaluated in the ED due to a prior history of diverticulitis.  Patient otherwise denies any diaphoresis, chest pain, palpitations, dyspnea, diarrhea, dysuria or urinary frequency.  Interim history Patient admitted for acute appendicitis.  Status post surgery on 06/24/2018.  Currently has NG tube in place, pending further recommendations from general surgery.  Assessment & Plan   Sepsis secondary to Acute appendicitis vs UTI/GPR bacteremia -Sepsis present on admission, as patient presented with fever, tachypnea, leukocytosis -Urine was cloudy in appearance, many bacteria, positive nitrites, moderate leukocytes,>50WBC (however patient states that she has not had any burning or pain with urination) -CT abdomen pelvis showed acute uncomplicated appendicitis -Urine culture >100k GNR- EColi Resistant to Cipro, Zosyn, ampicillin -Blood culture showed GPR  -Initially placed on Zosyn, however given urine culture results placed her on ceftriaxone and Flagyl -Surgery consulted and appreciated, status post laparoscopic lysis of adhesions, laparoscopic appendectomy, primary incisional hernia repair.  Found to have a gangrenous appendicitis on partial small bowel obstruction.   -General surgery recommending 10-14 days  -given GPR, will repeat blood cultures and wait for senisitivies of cultures from 06/23/2018  Hypokalemia/hypomagnesemia -Potassium improved, currently  3.6, with additional dose -Magnesium 1.6, will give supplementation and continue to monitor   Asymptomatic bacteriuria -See discussion as listed above -Urine culture >100k GNR-E. coli -Discontinued Zosyn -Continue ceftriaxone and Flagyl given results of urine culture  Essential hypertension -Blood pressure stable -Will restart home medications of amlodipine and atenolol -Continue IV hydralazine as needed  Ascending thoracic aortic aneurysm -Patient follows with cardiothoracic surgery, Dr. Prescott Gum.  Stable, 5.4 cm. -Continue medical therapy with blood pressure control  Hyperthyroidism -Underwent radioactive iodine therapy in January 2020 and is currently followed by her PCP as an outpatient.  Currently not on any type of replacement therapy.  Anxiety/depression -Continue Celexa  Hyperlipidemia -Continue statin  DVT Prophylaxis  SCDs --> lovenox  Code Status: Full  Family Communication: None at bedside. Daughter via phone.  Disposition Plan: Admitted.  Pending sensitivities on blood cultures from 06/23/2018 and repeat cultures.  Suspect home within 24 to 48 hours.  Consultants General surgery  Procedures  laparoscopic lysis of adhesions, laparoscopic appendectomy, primary incisional hernia repair  Antibiotics   Anti-infectives (From admission, onward)   Start     Dose/Rate Route Frequency Ordered Stop   06/26/18 1000  cefTRIAXone (ROCEPHIN) 2 g in sodium chloride 0.9 % 100 mL IVPB     2 g 200 mL/hr over 30 Minutes Intravenous Every 24 hours 06/26/18 0924     06/26/18 1000  metroNIDAZOLE (FLAGYL) IVPB 500 mg     500 mg 100 mL/hr over 60 Minutes Intravenous Every 8 hours 06/26/18 0924     06/24/18 1800  piperacillin-tazobactam (ZOSYN) IVPB 3.375 g  Status:  Discontinued     3.375 g 12.5 mL/hr over 240 Minutes Intravenous Every 8 hours 06/24/18 1603 06/26/18 0924   06/24/18 1115  cefoTEtan (CEFOTAN) 2 g in sodium chloride 0.9 % 100 mL IVPB  2 g 200 mL/hr over 30  Minutes Intravenous On call to O.R. 06/24/18 1102 06/24/18 1237   06/24/18 0200  piperacillin-tazobactam (ZOSYN) IVPB 3.375 g  Status:  Discontinued     3.375 g 12.5 mL/hr over 240 Minutes Intravenous Every 8 hours 06/24/18 0110 06/24/18 1603   06/23/18 1730  piperacillin-tazobactam (ZOSYN) IVPB 3.375 g     3.375 g 100 mL/hr over 30 Minutes Intravenous  Once 06/23/18 1715 06/24/18 1133      Subjective:   Latoya Curry seen and examined today.  Patient states she is feeling better today.  Denies chest pain, shortness breath, abdominal pain, nausea or vomiting, diarrhea constipation, dizziness or headache. Objective:   Vitals:   06/28/18 1340 06/28/18 2057 06/29/18 0541 06/29/18 0545  BP: (!) 146/77 (!) 159/93  (!) 147/91  Pulse: 75 98  97  Resp: 17 16  (!) 24  Temp: 98.3 F (36.8 C) 98.5 F (36.9 C)  98.4 F (36.9 C)  TempSrc: Oral Oral  Oral  SpO2: 97% 95%  95%  Weight:   72.8 kg   Height:        Intake/Output Summary (Last 24 hours) at 06/29/2018 1131 Last data filed at 06/29/2018 1000 Gross per 24 hour  Intake 2263.42 ml  Output 56 ml  Net 2207.42 ml   Filed Weights   06/27/18 0719 06/28/18 0148 06/29/18 0541  Weight: 73.3 kg 73.4 kg 72.8 kg   Exam  General: Well developed, well nourished, NAD, appears stated age  47: NCAT, mucous membranes moist.   Neck: Supple  Cardiovascular: S1 S2 auscultated, RRR  Respiratory: Clear to auscultation bilaterally with equal chest rise  Abdomen: Soft, nontender, nondistended, + bowel sounds  Extremities: warm dry without cyanosis clubbing or edema  Neuro: AAOx3, nonfocal  Psych: Appropriate mood and affect, pleasant   Data Reviewed: I have personally reviewed following labs and imaging studies  CBC: Recent Labs  Lab 06/23/18 1719  06/25/18 0327 06/26/18 0312 06/27/18 0420 06/28/18 0355 06/29/18 0307  WBC 24.8*   < > 12.9* 12.2* 11.6* 10.8* 10.4  NEUTROABS 22.8*  --   --   --   --   --   --   HGB 15.1*   <  > 12.7 11.7* 11.7* 12.1 11.7*  HCT 45.9   < > 39.5 37.1 36.2 36.8 37.4  MCV 89.8   < > 93.4 93.2 93.1 91.5 92.6  PLT 239   < > 193 225 244 272 301   < > = values in this interval not displayed.   Basic Metabolic Panel: Recent Labs  Lab 06/24/18 0848 06/25/18 0327 06/26/18 3235 06/27/18 0420 06/28/18 0355 06/29/18 0307  NA 136 137 137 137 136 138  K 3.6 2.8* 3.6 3.0* 3.0* 3.6  CL 103 102 98 98 100 105  CO2 22 24 29 27 29 24   GLUCOSE 101* 100* 82 67* 109* 116*  BUN 32* 30* 24* 18 10 6*  CREATININE 1.01* 1.02* 0.80 0.71 0.65 0.70  CALCIUM 9.0 8.6* 8.7* 8.6* 8.4* 8.5*  MG 2.3 2.1  --  1.7 1.8 1.6*   GFR: Estimated Creatinine Clearance: 51.6 mL/min (by C-G formula based on SCr of 0.7 mg/dL). Liver Function Tests: Recent Labs  Lab 06/23/18 1719  AST 29  ALT 16  ALKPHOS 58  BILITOT 1.7*  PROT 7.6  ALBUMIN 4.0   No results for input(s): LIPASE, AMYLASE in the last 168 hours. No results for input(s): AMMONIA in the last 168 hours. Coagulation  Profile: No results for input(s): INR, PROTIME in the last 168 hours. Cardiac Enzymes: No results for input(s): CKTOTAL, CKMB, CKMBINDEX, TROPONINI in the last 168 hours. BNP (last 3 results) No results for input(s): PROBNP in the last 8760 hours. HbA1C: No results for input(s): HGBA1C in the last 72 hours. CBG: No results for input(s): GLUCAP in the last 168 hours. Lipid Profile: No results for input(s): CHOL, HDL, LDLCALC, TRIG, CHOLHDL, LDLDIRECT in the last 72 hours. Thyroid Function Tests: No results for input(s): TSH, T4TOTAL, FREET4, T3FREE, THYROIDAB in the last 72 hours. Anemia Panel: No results for input(s): VITAMINB12, FOLATE, FERRITIN, TIBC, IRON, RETICCTPCT in the last 72 hours. Urine analysis:    Component Value Date/Time   COLORURINE YELLOW 06/23/2018 1719   APPEARANCEUR CLOUDY (A) 06/23/2018 1719   LABSPEC >1.030 (H) 06/23/2018 1719   PHURINE 5.5 06/23/2018 1719   GLUCOSEU NEGATIVE 06/23/2018 1719   HGBUR  SMALL (A) 06/23/2018 1719   BILIRUBINUR NEGATIVE 06/23/2018 1719   KETONESUR NEGATIVE 06/23/2018 1719   PROTEINUR NEGATIVE 06/23/2018 1719   UROBILINOGEN 0.2 01/08/2007 2322   NITRITE POSITIVE (A) 06/23/2018 1719   LEUKOCYTESUR MODERATE (A) 06/23/2018 1719   Sepsis Labs: @LABRCNTIP (procalcitonin:4,lacticidven:4)  ) Recent Results (from the past 240 hour(s))  Urine culture     Status: Abnormal   Collection Time: 06/23/18  5:19 PM  Result Value Ref Range Status   Specimen Description   Final    URINE, RANDOM Performed at [facility], [address] [place], [place] [address]    Special Requests   Final    NONE Performed at [facility], [address] [place], [place] [address]    Culture >=100,000 COLONIES/mL ESCHERICHIA COLI (A)  Final   Report Status 06/26/2018 FINAL  Final   Organism ID, Bacteria ESCHERICHIA COLI (A)  Final      Susceptibility   Escherichia coli - MIC*    AMPICILLIN >=32 RESISTANT Resistant     CEFAZOLIN 32 INTERMEDIATE Intermediate     CEFTRIAXONE <=1 SENSITIVE Sensitive     CIPROFLOXACIN >=4 RESISTANT Resistant     GENTAMICIN <=1 SENSITIVE Sensitive     IMIPENEM <=0.25 SENSITIVE Sensitive     NITROFURANTOIN <=16 SENSITIVE Sensitive     TRIMETH/SULFA <=20 SENSITIVE Sensitive     AMPICILLIN/SULBACTAM >=32 RESISTANT Resistant     PIP/TAZO >=128 RESISTANT Resistant     Extended ESBL NEGATIVE Sensitive     * >=100,000 COLONIES/mL ESCHERICHIA COLI  Blood culture (routine x 2)     Status: None (Preliminary result)   Collection Time: 06/23/18  5:20 PM  Result Value Ref Range Status   Specimen Description   Final    BLOOD BLOOD LEFT FOREARM Performed at [facility], [address] [place], [place] [address]    Special Requests   Final    BOTTLES DRAWN AEROBIC AND ANAEROBIC Blood Culture adequate volume Performed at [facility], [address] [place], [place] [address]    Culture  Setup Time   Final     GRAM POSITIVE RODS ANAEROBIC BOTTLE ONLY CRITICAL RESULT CALLED TO, READ BACK BY AND VERIFIED WITHKeturah Barre [facility] PHARMD 1935 06/25/18 A BROWNING Performed at [facility], [address] [place], [place] [address]    Culture GRAM POSITIVE RODS  Final   Report Status PENDING  Incomplete  Blood culture (routine x 2)     Status: None (Preliminary result)   Collection Time:  06/23/18  6:10 PM  Result Value Ref Range Status   Specimen Description   Final    BLOOD BLOOD RIGHT ARM Performed at [facility], [address] [place], [place] [address]    Special Requests   Final    BOTTLES DRAWN AEROBIC AND ANAEROBIC Blood Culture adequate volume Performed at [facility], [address] [place], [place] [address]    Culture  Setup Time   Final    GRAM POSITIVE RODS ANAEROBIC BOTTLE ONLY CRITICAL VALUE NOTED.  VALUE IS CONSISTENT WITH PREVIOUSLY REPORTED AND CALLED VALUE. Performed at [facility], [place] [address], [place], [place] [address]    Culture GRAM POSITIVE RODS  Final   Report Status PENDING  Incomplete  SARS Coronavirus 2 [facility] order, Performed in [place] hospital lab)     Status: None   Collection Time: 06/23/18  7:14 PM  Result Value Ref Range Status   SARS Coronavirus 2 NEGATIVE NEGATIVE Final    Comment: (NOTE) If result is NEGATIVE SARS-CoV-2 target nucleic acids are NOT DETECTED. The SARS-CoV-2 RNA is generally detectable in upper and lower  respiratory specimens during the acute phase of infection. The lowest  concentration of SARS-CoV-2 viral copies this assay can detect is 250  copies / mL. A negative result does not preclude SARS-CoV-2 infection  and should not be used as the sole basis for treatment or other  patient management decisions.  A negative result may occur with  improper specimen collection / handling, submission of specimen other  than nasopharyngeal swab, presence of viral mutation(s) within the  areas targeted by  this assay, and inadequate number of viral copies  (<250 copies / mL). A negative result must be combined with clinical  observations, patient history, and epidemiological information. If result is POSITIVE SARS-CoV-2 target nucleic acids are DETECTED. The SARS-CoV-2 RNA is generally detectable in upper and lower  respiratory specimens dur ing the acute phase of infection.  Positive  results are indicative of active infection with SARS-CoV-2.  Clinical  correlation with patient history and other diagnostic information is  necessary to determine patient infection status.  Positive results do  not rule out bacterial infection or co-infection with other viruses. If result is PRESUMPTIVE POSTIVE SARS-CoV-2 nucleic acids MAY BE PRESENT.   A presumptive positive result was obtained on the submitted specimen  and confirmed on repeat testing.  While 2019 novel coronavirus  (SARS-CoV-2) nucleic acids may be present in the submitted sample  additional confirmatory testing may be necessary for epidemiological  and / or clinical management purposes  to differentiate between  SARS-CoV-2 and other Sarbecovirus currently known to infect humans.  If clinically indicated additional testing with an alternate test  methodology [phone]) is advised. The SARS-CoV-2 RNA is generally  detectable in upper and lower respiratory sp ecimens during the acute  phase of infection. The expected result is Negative. Fact Sheet for Patients:  StrictlyIdeas.no Fact Sheet for Healthcare Providers: BankingDealers.co.za This test is not yet approved or cleared by the [place] FDA and has been authorized for detection and/or diagnosis of SARS-CoV-2 by FDA under an Emergency Use Authorization (EUA).  This EUA will remain in effect (meaning this test can be used) for the duration of the COVID-19 declaration under Section 564(b)(1) of the Act, 21 U.S.C. section  360bbb-3(b)(1), unless the authorization is terminated or revoked sooner. Performed at [facility], [place] [address], [place], [place] [address]  MRSA PCR Screening     Status: None   Collection Time: 06/24/18  2:11 AM  Result Value Ref Range Status   MRSA by PCR NEGATIVE NEGATIVE Final    Comment:        The GeneXpert MRSA Assay (FDA approved for NASAL specimens only), is one component of a comprehensive MRSA colonization surveillance program. It is not intended to diagnose MRSA infection nor to guide or monitor treatment for MRSA infections. Performed at [facility], [place] [address] [place], [place] [address]       Radiology Studies: No results found.   Scheduled Meds: . amLODipine  2.5 mg Oral Daily  . atenolol-chlorthalidone  1 tablet Oral Daily  . lip balm  1 application Topical BID  . pantoprazole  40 mg Oral Daily  . rosuvastatin  5 mg Oral Once per day on Mon Wed Fri   Continuous Infusions: . cefTRIAXone (ROCEPHIN)  IV 2 g (06/29/18 0915)  . lactated ringers 75 mL/hr at 06/28/18 1601  . magnesium sulfate bolus IVPB    . methocarbamol (ROBAXIN) IV    . metronidazole 500 mg (06/29/18 0730)  . ondansetron (ZOFRAN) IV       LOS: 6 days   Time Spent in minutes   30 minutes  Loomis Anacker D.O. on 06/29/2018 at 11:31 AM  Between 7am to 7pm - Please see pager noted on [email or web address]  After 7pm go to [email or web address]  And look for the night coverage person covering for me after hours  [facility] Office  [phone]

## 2018-06-30 LAB — CULTURE, BLOOD (ROUTINE X 2)
Special Requests: ADEQUATE
Special Requests: ADEQUATE

## 2018-06-30 LAB — BASIC METABOLIC PANEL
Anion gap: 8 (ref 5–15)
BUN: 6 mg/dL — ABNORMAL LOW (ref 8–23)
CO2: 26 mmol/L (ref 22–32)
Calcium: 8.6 mg/dL — ABNORMAL LOW (ref 8.9–10.3)
Chloride: 104 mmol/L (ref 98–111)
Creatinine, Ser: 0.66 mg/dL (ref 0.44–1.00)
GFR calc Af Amer: >60 mL/min (ref >60)
GFR calc non Af Amer: >60 mL/min (ref >60)
Glucose, Bld: 99 mg/dL (ref 70–99)
Potassium: 3.8 mmol/L (ref 3.5–5.1)
Sodium: 138 mmol/L (ref 135–145)

## 2018-06-30 LAB — CBC
HCT: 39.7 % (ref 36.0–46.0)
Hemoglobin: 12.3 g/dL (ref 12.0–15.0)
MCH: 29 pg (ref 26.0–34.0)
MCHC: 31 g/dL (ref 30.0–36.0)
MCV: 93.6 fL (ref 80.0–100.0)
Platelets: 360 10*3/uL (ref 150–400)
RBC: 4.24 MIL/uL (ref 3.87–5.11)
RDW: 14.5 % (ref 11.5–15.5)
WBC: 12.1 10*3/uL — ABNORMAL HIGH (ref 4.0–10.5)
nRBC: 0 % (ref 0.0–0.2)

## 2018-06-30 MED ORDER — SODIUM CHLORIDE 0.9 % IV SOLN
3.0000 g | Freq: Four times a day (QID) | INTRAVENOUS | Status: DC
  Administered 2018-06-30 – 2018-07-01 (×4): 3 g via INTRAVENOUS
  Filled 2018-06-30 (×6): qty 3

## 2018-06-30 MED ORDER — ENOXAPARIN SODIUM 40 MG/0.4ML ~~LOC~~ SOLN
40.0000 mg | SUBCUTANEOUS | Status: DC
  Administered 2018-06-30: 40 mg via SUBCUTANEOUS
  Filled 2018-06-30: qty 0.4

## 2018-06-30 NOTE — Progress Notes (Signed)
Occupational Therapy Treatment Patient Details Name: Latoya Curry MRN: [id number] DOB: 05-12-1930 Today's Date: 06/30/2018    History of present illness 85 yo female admitted to ED on 4/29 with abdominal pain and fever. Covid -. Pt s/p lap appendectomy, incisional hernia repair, and lysis of adhesions 4/30. PMH includes LBP, diverticulitis, DJD, HTN, obesity, osteopenia, recurrent UTIs, transient global amnesia, bilateral TKR, ascending thoracic aortic aneurysm.    OT comments  Pt doing well and so motivated to get well!  Follow Up Recommendations  Home health OT;Supervision - Intermittent    Equipment Recommendations  3 in 1 bedside commode    Recommendations for Other Services      Precautions / Restrictions Precautions Precautions: Fall Precaution Comments: recent ABD surgery/drain Restrictions Weight Bearing Restrictions: No       Mobility Bed Mobility Overal bed mobility: Needs Assistance Bed Mobility: Sit to Supine;Supine to Sit     Supine to sit: Min assist Sit to supine: Min assist   General bed mobility comments: assisted back to bed and positioned to comfort  Transfers Overall transfer level: Needs assistance Equipment used: Rolling walker (2 wheeled) Transfers: Sit to/from [facility] Sit to Stand: Supervision;Min guard Stand pivot transfers: Supervision;Min guard                ADL either performed or assessed with clinical judgement   ADL Overall ADL's : Needs assistance/impaired Eating/Feeding: Set up;Sitting   Grooming: Set up;Sitting   Upper Body Bathing: Set up;Sitting   Lower Body Bathing: Minimal assistance;Sit to/from stand;Cueing for sequencing;Cueing for safety   Upper Body Dressing : Set up;Sitting   Lower Body Dressing: Minimal assistance;Sit to/from stand;Cueing for sequencing;Cueing for safety   Toilet Transfer: Min guard;RW;Ambulation;Comfort height toilet   Toileting- Clothing Manipulation and Hygiene:  Min guard;Sit to/from stand;Cueing for sequencing;Cueing for safety       Functional mobility during ADLs: Min guard;Rolling walker General ADL Comments: daugther will A as needed     Vision Baseline Vision/History: Wears glasses Wears Glasses: At all times                    Pertinent Vitals/ Pain       Pain Assessment: No/denies pain         Frequency  Min 2X/week        Progress Toward Goals  OT Goals(current goals can now be found in the care plan section)  Progress towards OT goals: Progressing toward goals     Plan Discharge plan remains appropriate       AM-PAC OT "6 Clicks" Daily Activity     Outcome Measure   Help from another person eating meals?: None Help from another person taking care of personal grooming?: None Help from another person toileting, which includes using toliet, bedpan, or urinal?: A Little Help from another person bathing (including washing, rinsing, drying)?: A Little Help from another person to put on and taking off regular upper body clothing?: None Help from another person to put on and taking off regular lower body clothing?: A Little 6 Click Score: 21    End of Session Equipment Utilized During Treatment: Gait belt;Rolling walker  OT Visit Diagnosis: Other abnormalities of gait and mobility (R26.89);Muscle weakness (generalized) (M62.81);Pain Pain - part of body: (abdomen)   Activity Tolerance Patient tolerated treatment well   Patient Left in bed;with call bell/phone within reach   Nurse Communication Mobility status;Other (comment)(requesting assist getting changed)        Time:  1278-7183 OT Time Calculation (min): 21 min  Charges: OT General Charges $OT Visit: 1 Visit OT Treatments $Self Care/Home Management : 8-22 mins  Kari Baars, [place] [phone] Office- [phone], Edwena Felty D 06/30/2018, 3:24 PM

## 2018-06-30 NOTE — Progress Notes (Signed)
[facility]/Trauma Progress Note  6 Days Post-Op   Assessment/Plan Principal Problem:   Acute gangrenous appendicitis s/p lap appendectomy 06/24/2018 Active Problems:   Essential hypertension   Hypokalemia   Small bowel obstruction, partial, s/p lap adhesiolysis 06/24/2018   Incarcerated incisional hernia s/p primary repair 06/24/2018  Gangrenous appendicitis - S/P lap appy, LOA, primary incisional hernia repair, 04/30, Dr. Johney Maine Postop Ileus - resolved, tolerating diet, having bowel function Bacteremia - gram positive rods in anaerobic bottle, culture still pending - WBC up slightly today to 12.1  FEN: soft diet  VTE: SCD's, okay for lovenox from our standpoint, will defer to medicine ID: Zosyn 04/29-05/02; Rocephin & Flagyl 05/02>> Foley: none Follow up: Dr. Johney Maine  DISPO: WBC up slightly, no pain, afebrile, having bowel function. Will recheck WBC in am and if it continues to climb she will need a CT scan. Blood cultures pending for sensitivities    LOS: 7 days    Subjective: CC: no complaints  Pt states no abdominal pain. She feels pressure when she needs to have a BM but then the pressure goes away. She denies nausea, vomiting or fever. She just had a BM.   Objective: Vital signs in last 24 hours: Temp:  [98.4 F (36.9 C)-99.2 F (37.3 C)] 98.4 F (36.9 C) (05/06 0522) Pulse Rate:  [90-118] 90 (05/06 0522) Resp:  [18-20] 20 (05/06 0522) BP: (148-157)/(85-93) 150/85 (05/06 0522) SpO2:  [95 %-97 %] 96 % (05/06 0522) Weight:  [71.8 kg] 71.8 kg (05/06 0614) Last BM Date: 06/29/18  Intake/Output from previous day: 05/05 0701 - 05/06 0700 In: 2659.5 [P.O.:460; I.V.:1725; IV Piggyback:474.5] Out: 320 [Urine:300; Drains:20] Intake/Output this shift: No intake/output data recorded.  PE: Gen:  Alert, NAD, pleasant, cooperative Cardio: RRR Pulm:  Rate and effort normal Abd: Soft, NT/ND, +BS, incisions C/D/I, drain with minimal serosanguinous  drainage,no peritonitis  Skin: no rashes noted, warm and dry  Anti-infectives: Anti-infectives (From admission, onward)   Start     Dose/Rate Route Frequency Ordered Stop   06/26/18 1000  cefTRIAXone (ROCEPHIN) 2 g in sodium chloride 0.9 % 100 mL IVPB     2 g 200 mL/hr over 30 Minutes Intravenous Every 24 hours 06/26/18 0924     06/26/18 1000  metroNIDAZOLE (FLAGYL) IVPB 500 mg     500 mg 100 mL/hr over 60 Minutes Intravenous Every 8 hours 06/26/18 0924     06/24/18 1800  piperacillin-tazobactam (ZOSYN) IVPB 3.375 g  Status:  Discontinued     3.375 g 12.5 mL/hr over 240 Minutes Intravenous Every 8 hours 06/24/18 1603 06/26/18 0924   06/24/18 1115  cefoTEtan (CEFOTAN) 2 g in sodium chloride 0.9 % 100 mL IVPB     2 g 200 mL/hr over 30 Minutes Intravenous On call to O.R. 06/24/18 1102 06/24/18 1237   06/24/18 0200  piperacillin-tazobactam (ZOSYN) IVPB 3.375 g  Status:  Discontinued     3.375 g 12.5 mL/hr over 240 Minutes Intravenous Every 8 hours 06/24/18 0110 06/24/18 1603   06/23/18 1730  piperacillin-tazobactam (ZOSYN) IVPB 3.375 g     3.375 g 100 mL/hr over 30 Minutes Intravenous  Once 06/23/18 1715 06/24/18 1133      Lab Results:  Recent Labs    06/29/18 0307 06/30/18 0349  WBC 10.4 12.1*  HGB 11.7* 12.3  HCT 37.4 39.7  PLT 301 360   BMET Recent Labs    06/29/18 0307 06/30/18 0349  NA 138 138  K 3.6 3.8  CL 105 104  CO2 24 26  GLUCOSE 116* 99  BUN 6* 6*  CREATININE 0.70 0.66  CALCIUM 8.5* 8.6*   PT/INR No results for input(s): LABPROT, INR in the last 72 hours. CMP     Component Value Date/Time   NA 138 06/30/2018 0349   NA 144 08/25/2017 0910   K 3.8 06/30/2018 0349   CL 104 06/30/2018 0349   CO2 26 06/30/2018 0349   GLUCOSE 99 06/30/2018 0349   BUN 6 (L) 06/30/2018 0349   BUN 25 08/25/2017 0910   CREATININE 0.66 06/30/2018 0349   CALCIUM 8.6 (L) 06/30/2018 0349   PROT 7.6 06/23/2018 1719   PROT 7.1 08/25/2017 0910   ALBUMIN 4.0 06/23/2018 1719    ALBUMIN 4.4 08/25/2017 0910   AST 29 06/23/2018 1719   ALT 16 06/23/2018 1719   ALKPHOS 58 06/23/2018 1719   BILITOT 1.7 (H) 06/23/2018 1719   BILITOT 0.6 08/25/2017 0910   GFRNONAA >60 06/30/2018 0349   GFRAA >60 06/30/2018 0349   Lipase  No results found for: LIPASE  Studies/Results: No results found.    Kalman Drape , [facility] 06/30/2018, 8:14 AM  Pager: [phone] Mon-Wed, Friday 7:00am-4:30pm Thurs 7am-11:30am  Consults: [phone]

## 2018-06-30 NOTE — Progress Notes (Signed)
PROGRESS NOTE    Latoya Curry  DXA:[id number] DOB: [birth year] DOA: 06/23/2018 PCP: Josetta Huddle, MD   Brief Narrative: 85 year old female with hypertension, hyperlipidemia, ascending thoracic aortic aneurysm, hypothyroidism status post RAI therapy with anxiety presented to [facility] with 2 days of lower abdominal pain nausea, intermittent fevers on 06/24/18.  Patient had tele  visit by her PCP who recommended evaluation to the ER due to prior history of diverticulitis.  Patient was admitted for acute appendicitis status post surgery 06/24/2018.  Subjective: On bedside chair. tolerating po. No abdominal pain, fever, nausea or vomitting WBC trended up in 12.  Assessment & Plan:   Acute gangrenous appendicitis s/p lap appendectomy 06/24/2018 with primary incisional hernia repair: Clinically stable, tolerating diet.  JP drain in place.  Appreciate surgery input on board. Cont antibiotis as below 10-14 day course. Repeat cbc in am given leucocytosis  Bacteremia with gram-positive rods- Eubacterium Limosum in anaerobic bottle: This is likely secondary to #1, discussed with the pharmacist antibiotic changing to Unasyn IV, upon discharge changed to p.o. Augmentin to complete the course.  Repeat blood culture 06/29/18 is NGTD. Has mild leucocytosis- repeat cbc in am Recent Labs  Lab 06/26/18 0312 06/27/18 0420 06/28/18 0355 06/29/18 0307 06/30/18 0349  WBC 12.2* 11.6* 10.8* 10.4 12.1*    Asymptomatic bacteriuria, urine culture with E. coli.  Patient off antibiotics  Postop ileus: resolved, tolerating diet with return of bowel function  Essential hypertension:Blood pressure controlled on amlodipine, atenolol and HCTZ  Hypokalemia/hypomagnesemia: Continue supplementation.  Ascending thoracic aortic aneurysm followed by cardiothoracic surgery Dr. Darcey Nora stable 5.4 cm continue medical therapy with blood pressure optimization.  Hyperthyroidism with history of RAI therapy in  January 2020 followed by PCP.  HLD- cont her statin  Anxiety/depression mood is stable continue Celexa.  COVID 19 is negative  DVT prophylaxis: started sq lovenox Code Status: full Family Communication:  Pt aware about plan. I called and updated her daughter and all questions answered. She plans to take patient to her home until she is better then have her return to [place]. Disposition Plan: Remains inpatient pending clinical improvement. Anticipate home 24-48 hrs if improves   Consultants:  gen surgery  Procedures:  Antimicrobials: Anti-infectives (From admission, onward)   Start     Dose/Rate Route Frequency Ordered Stop   06/30/18 1200  Ampicillin-Sulbactam (UNASYN) 3 g in sodium chloride 0.9 % 100 mL IVPB     3 g 200 mL/hr over 30 Minutes Intravenous Every 6 hours 06/30/18 1042     06/26/18 1000  cefTRIAXone (ROCEPHIN) 2 g in sodium chloride 0.9 % 100 mL IVPB  Status:  Discontinued     2 g 200 mL/hr over 30 Minutes Intravenous Every 24 hours 06/26/18 0924 06/30/18 1042   06/26/18 1000  metroNIDAZOLE (FLAGYL) IVPB 500 mg  Status:  Discontinued     500 mg 100 mL/hr over 60 Minutes Intravenous Every 8 hours 06/26/18 0924 06/30/18 1042   06/24/18 1800  piperacillin-tazobactam (ZOSYN) IVPB 3.375 g  Status:  Discontinued     3.375 g 12.5 mL/hr over 240 Minutes Intravenous Every 8 hours 06/24/18 1603 06/26/18 0924   06/24/18 1115  cefoTEtan (CEFOTAN) 2 g in sodium chloride 0.9 % 100 mL IVPB     2 g 200 mL/hr over 30 Minutes Intravenous On call to O.R. 06/24/18 1102 06/24/18 1237   06/24/18 0200  piperacillin-tazobactam (ZOSYN) IVPB 3.375 g  Status:  Discontinued     3.375 g 12.5 mL/hr over 240  Minutes Intravenous Every 8 hours 06/24/18 0110 06/24/18 1603   06/23/18 1730  piperacillin-tazobactam (ZOSYN) IVPB 3.375 g     3.375 g 100 mL/hr over 30 Minutes Intravenous  Once 06/23/18 1715 06/24/18 1133       Objective: Vitals:   06/29/18 2113 06/30/18 0522 06/30/18 0614 06/30/18  0930  BP: (!) 155/93 (!) 150/85  134/82  Pulse: 91 90  98  Resp: 18 20    Temp: 99.2 F (37.3 C) 98.4 F (36.9 C)    TempSrc: Oral Oral    SpO2: 95% 96%    Weight:   71.8 kg   Height:        Intake/Output Summary (Last 24 hours) at 06/30/2018 1412 Last data filed at 06/30/2018 1014 Gross per 24 hour  Intake 1939.16 ml  Output 315 ml  Net 1624.16 ml   Filed Weights   06/28/18 0148 06/29/18 0541 06/30/18 0614  Weight: 73.4 kg 72.8 kg 71.8 kg   Weight change: -1 kg  Body mass index is 25.55 kg/m.  Intake/Output from previous day: 05/05 0701 - 05/06 0700 In: 2659.5 [P.O.:460; I.V.:1725; IV Piggyback:474.5] Out: 320 [Urine:300; Drains:20] Intake/Output this shift: Total I/O In: 374.2 [I.V.:225; IV Piggyback:149.2] Out: 0   Examination:  General exam: Appears calm and comfortable, elderly,frail HEENT:PERRL,Oral mucosa moist, Ear/Nose normal on gross exam Respiratory system: Bilateral equal air entry, normal vesicular breath sounds, no wheezes or crackles  Cardiovascular system: S1 & S2 heard,No JVD, murmurs. Gastrointestinal system: Abdomen is  Soft, full, non tender, BS +, JP drain + .  Incision site is clean dry and intact. Nervous System:Alert and oriented. No focal neurological deficits/moving extremities, sensation intact. Extremities: No edema, no clubbing, distal peripheral pulses palpable. Skin: No rashes, lesions, no icterus MSK: Normal muscle bulk,tone ,power  Medications:  Scheduled Meds: . amLODipine  2.5 mg Oral Daily  . atenolol  50 mg Oral Daily   And  . hydrochlorothiazide  25 mg Oral Daily  . enoxaparin (LOVENOX) injection  40 mg Subcutaneous Q24H  . lip balm  1 application Topical BID  . pantoprazole  40 mg Oral Daily  . rosuvastatin  5 mg Oral Once per day on Mon Wed Fri   Continuous Infusions: . ampicillin-sulbactam (UNASYN) IV    . lactated ringers 75 mL/hr at 06/30/18 0700  . methocarbamol (ROBAXIN) IV    . ondansetron (ZOFRAN) IV       Data Reviewed: I have personally reviewed following labs and imaging studies  CBC: Recent Labs  Lab 06/23/18 1719  06/26/18 0312 06/27/18 0420 06/28/18 0355 06/29/18 0307 06/30/18 0349  WBC 24.8*   < > 12.2* 11.6* 10.8* 10.4 12.1*  NEUTROABS 22.8*  --   --   --   --   --   --   HGB 15.1*   < > 11.7* 11.7* 12.1 11.7* 12.3  HCT 45.9   < > 37.1 36.2 36.8 37.4 39.7  MCV 89.8   < > 93.2 93.1 91.5 92.6 93.6  PLT 239   < > 225 244 272 301 360   < > = values in this interval not displayed.   Basic Metabolic Panel: Recent Labs  Lab 06/24/18 0848 06/25/18 0327 06/26/18 6045 06/27/18 0420 06/28/18 0355 06/29/18 0307 06/30/18 0349  NA 136 137 137 137 136 138 138  K 3.6 2.8* 3.6 3.0* 3.0* 3.6 3.8  CL 103 102 98 98 100 105 104  CO2 22 24 29 27 29 24 26   GLUCOSE 101*  100* 82 67* 109* 116* 99  BUN 32* 30* 24* 18 10 6* 6*  CREATININE 1.01* 1.02* 0.80 0.71 0.65 0.70 0.66  CALCIUM 9.0 8.6* 8.7* 8.6* 8.4* 8.5* 8.6*  MG 2.3 2.1  --  1.7 1.8 1.6*  --    GFR: Estimated Creatinine Clearance: 51.2 mL/min (by C-G formula based on SCr of 0.66 mg/dL). Liver Function Tests: Recent Labs  Lab 06/23/18 1719  AST 29  ALT 16  ALKPHOS 58  BILITOT 1.7*  PROT 7.6  ALBUMIN 4.0   No results for input(s): LIPASE, AMYLASE in the last 168 hours. No results for input(s): AMMONIA in the last 168 hours. Coagulation Profile: No results for input(s): INR, PROTIME in the last 168 hours. Cardiac Enzymes: No results for input(s): CKTOTAL, CKMB, CKMBINDEX, TROPONINI in the last 168 hours. BNP (last 3 results) No results for input(s): PROBNP in the last 8760 hours. HbA1C: No results for input(s): HGBA1C in the last 72 hours. CBG: No results for input(s): GLUCAP in the last 168 hours. Lipid Profile: No results for input(s): CHOL, HDL, LDLCALC, TRIG, CHOLHDL, LDLDIRECT in the last 72 hours. Thyroid Function Tests: No results for input(s): TSH, T4TOTAL, FREET4, T3FREE, THYROIDAB in the last 72 hours.  Anemia Panel: No results for input(s): VITAMINB12, FOLATE, FERRITIN, TIBC, IRON, RETICCTPCT in the last 72 hours. Sepsis Labs: Recent Labs  Lab 06/23/18 1719  LATICACIDVEN 1.4    Recent Results (from the past 240 hour(s))  Urine culture     Status: Abnormal   Collection Time: 06/23/18  5:19 PM  Result Value Ref Range Status   Specimen Description   Final    URINE, RANDOM Performed at [facility], [address] [place], [place] [address]    Special Requests   Final    NONE Performed at [facility], [address] [place], [place] [address]    Culture >=100,000 COLONIES/mL ESCHERICHIA COLI (A)  Final   Report Status 06/26/2018 FINAL  Final   Organism ID, Bacteria ESCHERICHIA COLI (A)  Final      Susceptibility   Escherichia coli - MIC*    AMPICILLIN >=32 RESISTANT Resistant     CEFAZOLIN 32 INTERMEDIATE Intermediate     CEFTRIAXONE <=1 SENSITIVE Sensitive     CIPROFLOXACIN >=4 RESISTANT Resistant     GENTAMICIN <=1 SENSITIVE Sensitive     IMIPENEM <=0.25 SENSITIVE Sensitive     NITROFURANTOIN <=16 SENSITIVE Sensitive     TRIMETH/SULFA <=20 SENSITIVE Sensitive     AMPICILLIN/SULBACTAM >=32 RESISTANT Resistant     PIP/TAZO >=128 RESISTANT Resistant     Extended ESBL NEGATIVE Sensitive     * >=100,000 COLONIES/mL ESCHERICHIA COLI  Blood culture (routine x 2)     Status: Abnormal   Collection Time: 06/23/18  5:20 PM  Result Value Ref Range Status   Specimen Description   Final    BLOOD BLOOD LEFT FOREARM Performed at [facility], [address] [place], [place] [address]    Special Requests   Final    BOTTLES DRAWN AEROBIC AND ANAEROBIC Blood Culture adequate volume Performed at [facility], [address] [place], [place] [address]    Culture  Setup Time   Final    GRAM POSITIVE RODS ANAEROBIC BOTTLE ONLY CRITICAL RESULT CALLED TO, READ BACK BY AND VERIFIED WITHKeturah Barre [facility] PHARMD [facility] 06/25/18 A BROWNING Performed at  [facility], [address], [place], [place] [address]  Culture EUBACTERIUM LIMOSUM (A)  Final   Report Status 06/30/2018 FINAL  Final  Blood culture (routine x 2)     Status: Abnormal   Collection Time: 06/23/18  6:10 PM  Result Value Ref Range Status   Specimen Description   Final    BLOOD BLOOD RIGHT ARM Performed at [facility], [address] [place], [place] [address]    Special Requests   Final    BOTTLES DRAWN AEROBIC AND ANAEROBIC Blood Culture adequate volume Performed at [facility], [address] [place], [place] [address]    Culture  Setup Time   Final    GRAM POSITIVE RODS ANAEROBIC BOTTLE ONLY CRITICAL VALUE NOTED.  VALUE IS CONSISTENT WITH PREVIOUSLY REPORTED AND CALLED VALUE. Performed at [facility], [place] [address], [place], [place] [address]    Culture EUBACTERIUM LIMOSUM (A)  Final   Report Status 06/30/2018 FINAL  Final  SARS Coronavirus 2 [facility] order, Performed in [facility] hospital lab)     Status: None   Collection Time: 06/23/18  7:14 PM  Result Value Ref Range Status   SARS Coronavirus 2 NEGATIVE NEGATIVE Final    Comment: (NOTE) If result is NEGATIVE SARS-CoV-2 target nucleic acids are NOT DETECTED. The SARS-CoV-2 RNA is generally detectable in upper and lower  respiratory specimens during the acute phase of infection. The lowest  concentration of SARS-CoV-2 viral copies this assay can detect is 250  copies / mL. A negative result does not preclude SARS-CoV-2 infection  and should not be used as the sole basis for treatment or other  patient management decisions.  A negative result may occur with  improper specimen collection / handling, submission of specimen other  than nasopharyngeal swab, presence of viral mutation(s) within the  areas targeted by this assay, and inadequate number of viral copies  (<250 copies / mL). A negative result must be combined with clinical  observations, patient history,  and epidemiological information. If result is POSITIVE SARS-CoV-2 target nucleic acids are DETECTED. The SARS-CoV-2 RNA is generally detectable in upper and lower  respiratory specimens dur ing the acute phase of infection.  Positive  results are indicative of active infection with SARS-CoV-2.  Clinical  correlation with patient history and other diagnostic information is  necessary to determine patient infection status.  Positive results do  not rule out bacterial infection or co-infection with other viruses. If result is PRESUMPTIVE POSTIVE SARS-CoV-2 nucleic acids MAY BE PRESENT.   A presumptive positive result was obtained on the submitted specimen  and confirmed on repeat testing.  While 2019 novel coronavirus  (SARS-CoV-2) nucleic acids may be present in the submitted sample  additional confirmatory testing may be necessary for epidemiological  and / or clinical management purposes  to differentiate between  SARS-CoV-2 and other Sarbecovirus currently known to infect humans.  If clinically indicated additional testing with an alternate test  methodology [phone]) is advised. The SARS-CoV-2 RNA is generally  detectable in upper and lower respiratory sp ecimens during the acute  phase of infection. The expected result is Negative. Fact Sheet for Patients:  StrictlyIdeas.no Fact Sheet for Healthcare Providers: BankingDealers.co.za This test is not yet approved or cleared by the [place] FDA and has been authorized for detection and/or diagnosis of SARS-CoV-2 by FDA under an Emergency Use Authorization (EUA).  This EUA will remain in effect (meaning this test can be used) for the duration of the COVID-19 declaration under Section 564(b)(1)  of the Act, 21 U.S.C. section 360bbb-3(b)(1), unless the authorization is terminated or revoked sooner. Performed at [facility], [place] [address] [place], [place] [address]   MRSA PCR  Screening     Status: None   Collection Time: 06/24/18  2:11 AM  Result Value Ref Range Status   MRSA by PCR NEGATIVE NEGATIVE Final    Comment:        The GeneXpert MRSA Assay (FDA approved for NASAL specimens only), is one component of a comprehensive MRSA colonization surveillance program. It is not intended to diagnose MRSA infection nor to guide or monitor treatment for MRSA infections. Performed at [facility], [place] [address], [place], [place] [address]   Culture, blood (routine x 2)     Status: None (Preliminary result)   Collection Time: 06/29/18 11:51 AM  Result Value Ref Range Status   Specimen Description   Final    BLOOD RIGHT ARM Performed at [facility] [address], [place], [place] [address]    Special Requests   Final    BOTTLES DRAWN AEROBIC AND ANAEROBIC Blood Culture adequate volume Performed at [facility] [address] [place], [place] [address]    Culture   Final    NO GROWTH < 24 HOURS Performed at [facility] [address], [place], [place] [address]    Report Status PENDING  Incomplete  Culture, blood (routine x 2)     Status: None (Preliminary result)   Collection Time: 06/29/18 11:51 AM  Result Value Ref Range Status   Specimen Description   Final    BLOOD RIGHT HAND Performed at [facility] [address], [place], [place] [address]    Special Requests   Final    BOTTLES DRAWN AEROBIC ONLY Blood Culture results may not be optimal due to an inadequate volume of blood received in culture bottles Performed at [facility] [address] [place], [place] [address]    Culture   Final    NO GROWTH < 24 HOURS Performed at [facility] [address] [place],  [address]    Report Status PENDING  Incomplete      Radiology Studies: No results found.    LOS: 7 days   Time spent: More than 50% of that time was  spent in counseling and/or coordination of care.  Antonieta Pert, MD [facility]  06/30/2018, 2:12 PM

## 2018-07-01 ENCOUNTER — Inpatient Hospital Stay (HOSPITAL_COMMUNITY): Payer: Medicare HMO

## 2018-07-01 LAB — CBC
HCT: 39 % (ref 36.0–46.0)
Hemoglobin: 12.3 g/dL (ref 12.0–15.0)
MCH: 29.4 pg (ref 26.0–34.0)
MCHC: 31.5 g/dL (ref 30.0–36.0)
MCV: 93.3 fL (ref 80.0–100.0)
Platelets: 389 10*3/uL (ref 150–400)
RBC: 4.18 MIL/uL (ref 3.87–5.11)
RDW: 14.5 % (ref 11.5–15.5)
WBC: 15.3 10*3/uL — ABNORMAL HIGH (ref 4.0–10.5)
nRBC: 0 % (ref 0.0–0.2)

## 2018-07-01 MED ORDER — SODIUM CHLORIDE (PF) 0.9 % IJ SOLN
INTRAMUSCULAR | Status: AC
  Filled 2018-07-01: qty 50

## 2018-07-01 MED ORDER — IOHEXOL 300 MG/ML  SOLN
100.0000 mL | Freq: Once | INTRAMUSCULAR | Status: AC | PRN
  Administered 2018-07-01: 100 mL via INTRAVENOUS

## 2018-07-01 MED ORDER — SODIUM CHLORIDE 0.9% FLUSH
10.0000 mL | Freq: Two times a day (BID) | INTRAVENOUS | Status: DC
  Administered 2018-07-03: 10 mL

## 2018-07-01 MED ORDER — SODIUM CHLORIDE 0.9% FLUSH: 10.0000 mL | INTRAVENOUS | Status: DC | PRN

## 2018-07-01 MED ORDER — ENOXAPARIN SODIUM 40 MG/0.4ML ~~LOC~~ SOLN: 40.0000 mg | SUBCUTANEOUS | Status: DC

## 2018-07-01 MED ORDER — IOHEXOL 300 MG/ML  SOLN: 30.0000 mL | Freq: Once | INTRAMUSCULAR | Status: DC | PRN

## 2018-07-01 MED ORDER — SODIUM CHLORIDE 0.9 % IV SOLN
1.0000 g | Freq: Three times a day (TID) | INTRAVENOUS | Status: DC
  Administered 2018-07-01 – 2018-07-03 (×7): 1 g via INTRAVENOUS
  Filled 2018-07-01 (×8): qty 1

## 2018-07-01 NOTE — Progress Notes (Signed)
[facility]/Trauma Progress Note  7 Days Post-Op   Assessment/Plan Principal Problem: Acute gangrenous appendicitis s/p lap appendectomy 06/24/2018 Active Problems: Essential hypertension Hypokalemia Small bowel obstruction, partial, s/p lap adhesiolysis 06/24/2018 Incarcerated incisional hernia s/p primary repair 06/24/2018  Gangrenous appendicitis - S/P lap appy, LOA, primary incisional hernia repair, 04/30, Dr. Johney Maine - incision sites are without signs of infection, drain SS, no abdominal pain Postop Ileus - resolved, tolerating diet, having bowel function Bacteremia - showed Eubacterium limosum, changed abx to Unasyn  - WBC up today to 15.3  WUJ:WJXB diet VTE: SCD's,okay forlovenoxfrom our standpoint, will defer to medicine JY:NWGNF 04/29-05/02; Rocephin & Flagyl 05/02-05/06; Unasyn 05/06>> Foley:none Follow up:Dr. Johney Maine  DISPO:WBC up again today to 15.3, no pain, afebrile, having bowel function. CT abd/pel pending in setting of increasing leukocytosis. Spoke to daughter, Blair Promise, about progress.       LOS: 8 days    Subjective: CC: wants to go home  Pt continues to have bowel function and tolerate diet. She has no abdominal pain. She denies SOB, cough, fever or chills overnight. I discussed CT scan with her.   Objective: Vital signs in last 24 hours: Temp:  [98.8 F (37.1 C)-99.1 F (37.3 C)] 98.9 F (37.2 C) (05/07 0612) Pulse Rate:  [81-98] 81 (05/07 0612) Resp:  [16-20] 19 (05/07 0612) BP: (134-145)/(78-87) 145/78 (05/07 0612) SpO2:  [94 %-100 %] 96 % (05/07 0612) Weight:  [71.7 kg] 71.7 kg (05/07 0612) Last BM Date: 06/30/18  Intake/Output from previous day: 05/06 0701 - 05/07 0700 In: 2474.2 [P.O.:600; I.V.:1425; IV Piggyback:449.2] Out: 452 [Urine:425; Drains:27] Intake/Output this shift: No intake/output data recorded.  PE: Gen: Alert, NAD, pleasant, cooperative Cardio: RRR Pulm:Rate andeffort normal Abd:  Soft, NT/ND, +BS, incisions C/D/I, drain with minimalserosanguinous drainage,no peritonitis Skin: no rashes noted, warm and dry    Anti-infectives: Anti-infectives (From admission, onward)   Start     Dose/Rate Route Frequency Ordered Stop   06/30/18 1200  Ampicillin-Sulbactam (UNASYN) 3 g in sodium chloride 0.9 % 100 mL IVPB     3 g 200 mL/hr over 30 Minutes Intravenous Every 6 hours 06/30/18 1042     06/26/18 1000  cefTRIAXone (ROCEPHIN) 2 g in sodium chloride 0.9 % 100 mL IVPB  Status:  Discontinued     2 g 200 mL/hr over 30 Minutes Intravenous Every 24 hours 06/26/18 0924 06/30/18 1042   06/26/18 1000  metroNIDAZOLE (FLAGYL) IVPB 500 mg  Status:  Discontinued     500 mg 100 mL/hr over 60 Minutes Intravenous Every 8 hours 06/26/18 0924 06/30/18 1042   06/24/18 1800  piperacillin-tazobactam (ZOSYN) IVPB 3.375 g  Status:  Discontinued     3.375 g 12.5 mL/hr over 240 Minutes Intravenous Every 8 hours 06/24/18 1603 06/26/18 0924   06/24/18 1115  cefoTEtan (CEFOTAN) 2 g in sodium chloride 0.9 % 100 mL IVPB     2 g 200 mL/hr over 30 Minutes Intravenous On call to O.R. 06/24/18 1102 06/24/18 1237   06/24/18 0200  piperacillin-tazobactam (ZOSYN) IVPB 3.375 g  Status:  Discontinued     3.375 g 12.5 mL/hr over 240 Minutes Intravenous Every 8 hours 06/24/18 0110 06/24/18 1603   06/23/18 1730  piperacillin-tazobactam (ZOSYN) IVPB 3.375 g     3.375 g 100 mL/hr over 30 Minutes Intravenous  Once 06/23/18 1715 06/24/18 1133      Lab Results:  Recent Labs    06/30/18 0349 07/01/18 0351  WBC 12.1* 15.3*  HGB 12.3 12.3  HCT  39.7 39.0  PLT 360 389   BMET Recent Labs    06/29/18 0307 06/30/18 0349  NA 138 138  K 3.6 3.8  CL 105 104  CO2 24 26  GLUCOSE 116* 99  BUN 6* 6*  CREATININE 0.70 0.66  CALCIUM 8.5* 8.6*   PT/INR No results for input(s): LABPROT, INR in the last 72 hours. CMP     Component Value Date/Time   NA 138 06/30/2018 0349   NA 144 08/25/2017 0910   K 3.8  06/30/2018 0349   CL 104 06/30/2018 0349   CO2 26 06/30/2018 0349   GLUCOSE 99 06/30/2018 0349   BUN 6 (L) 06/30/2018 0349   BUN 25 08/25/2017 0910   CREATININE 0.66 06/30/2018 0349   CALCIUM 8.6 (L) 06/30/2018 0349   PROT 7.6 06/23/2018 1719   PROT 7.1 08/25/2017 0910   ALBUMIN 4.0 06/23/2018 1719   ALBUMIN 4.4 08/25/2017 0910   AST 29 06/23/2018 1719   ALT 16 06/23/2018 1719   ALKPHOS 58 06/23/2018 1719   BILITOT 1.7 (H) 06/23/2018 1719   BILITOT 0.6 08/25/2017 0910   GFRNONAA >60 06/30/2018 0349   GFRAA >60 06/30/2018 0349   Lipase  No results found for: LIPASE  Studies/Results: No results found.    Kalman Drape , [facility] 07/01/2018, 8:39 AM  Pager: [phone] Mon-Wed, Friday 7:00am-4:30pm Thurs 7am-11:30am  Consults: [phone]

## 2018-07-01 NOTE — Progress Notes (Signed)
PT Cancellation Note  Patient Details Name: TARICA HARL MRN: [id number] DOB: April 09, 1930   Cancelled Treatment:     pt out of room for imaging this afternoon.  Pt has been evaluated and plans to D/C back home.  Will attempt to see another day.     Rica Koyanagi  PTA Acute  Rehabilitation Services Pager      [phone] Office      [phone]

## 2018-07-01 NOTE — Progress Notes (Signed)
PROGRESS NOTE    TAMARRA GEISELMAN  OZH:[id number] DOB: 1931/01/31 DOA: 06/23/2018 PCP: Josetta Huddle, MD   Brief Narrative: 85 year old female with hypertension, hyperlipidemia, ascending thoracic aortic aneurysm, hypothyroidism status post RAI therapy with anxiety presented to [place] with 2 days of lower abdominal pain nausea, intermittent fevers on 06/24/18.  Patient had tele  visit by her PCP who recommended evaluation to the ER due to prior history of diverticulitis.  Patient was admitted for acute appendicitis status post surgery 06/24/2018. Pt was waiting for return of bowel function with her staff ileus.  Patient is not tolerating diet.  She has leukocytosis worsening 5/6 and CT abd planned.  Subjective: Seen and examined this morning. Patient was having bowel movement, somewhat regular today, no nausea vomiting or worsening abdominal pain.  Denies any fever or chills. Has uptrending leukocytosis this morning.  Assessment & Plan:   Acute gangrenous appendicitis s/p lap appendectomy 06/24/2018 with primary incisional hernia repair: Clinically stable, tolerating diet.  JP drain in place.  Appreciate surgery input on board.  Checking CT abdomen pelvis due to worsening leukocytosis to rule out abscess.  cont antibiotis as below 10-14 day course.  Bacteremia with gram-positive rods- Eubacterium Limosum in anaerobic bottle: This is likely secondary to #1, discussed with the pharmacist antibiotic changed to Unasyn IV 5/6 and upon discharge changed to p.o. Augmentin to complete the course.  Repeat blood culture 06/29/18 is NGTD. Has mild leucocytosis- repeat cbc in am Recent Labs  Lab 06/27/18 0420 06/28/18 0355 06/29/18 0307 06/30/18 0349 07/01/18 0351  WBC 11.6* 10.8* 10.4 12.1* 15.3*   Asymptomatic bacteriuria, urine culture with E. coli.  Patient off antibiotics  Postop ileus: resolved, on po and tolerating well  Essential hypertension: BP controlled on home amlodipine,  atenolol and HCTZ  Hypokalemia/hypomagnesemia:replaced  Ascending thoracic aortic aneurysm followed by cardiothoracic surgery Dr. Darcey Nora stable 5.4 cm continue medical therapy with blood pressure optimization.  Hyperthyroidism with history of RAI therapy in January 2020 followed by PCP.  HLD- cont her statin  Anxiety/depression mood is stable continue Celexa.  COVID 19 is negative  DVT prophylaxis:  Cont sq lovenox Code Status: Full Family Communication:  Pt aware about plan. I had called and updated her daughter 5/6 and all questions answered. She plans to take patient to her home until she is better then have her return to [place]. Disposition Plan: Remains inpatient pending clinical improvement. Monitor CBC.   Consultants:  gen surgery  Procedures:  Antimicrobials: Anti-infectives (From admission, onward)   Start     Dose/Rate Route Frequency Ordered Stop   06/30/18 1200  Ampicillin-Sulbactam (UNASYN) 3 g in sodium chloride 0.9 % 100 mL IVPB     3 g 200 mL/hr over 30 Minutes Intravenous Every 6 hours 06/30/18 1042     06/26/18 1000  cefTRIAXone (ROCEPHIN) 2 g in sodium chloride 0.9 % 100 mL IVPB  Status:  Discontinued     2 g 200 mL/hr over 30 Minutes Intravenous Every 24 hours 06/26/18 0924 06/30/18 1042   06/26/18 1000  metroNIDAZOLE (FLAGYL) IVPB 500 mg  Status:  Discontinued     500 mg 100 mL/hr over 60 Minutes Intravenous Every 8 hours 06/26/18 0924 06/30/18 1042   06/24/18 1800  piperacillin-tazobactam (ZOSYN) IVPB 3.375 g  Status:  Discontinued     3.375 g 12.5 mL/hr over 240 Minutes Intravenous Every 8 hours 06/24/18 1603 06/26/18 0924   06/24/18 1115  cefoTEtan (CEFOTAN) 2 g in sodium chloride 0.9 %  100 mL IVPB     2 g 200 mL/hr over 30 Minutes Intravenous On call to O.R. 06/24/18 1102 06/24/18 1237   06/24/18 0200  piperacillin-tazobactam (ZOSYN) IVPB 3.375 g  Status:  Discontinued     3.375 g 12.5 mL/hr over 240 Minutes Intravenous Every 8 hours 06/24/18 0110  06/24/18 1603   06/23/18 1730  piperacillin-tazobactam (ZOSYN) IVPB 3.375 g     3.375 g 100 mL/hr over 30 Minutes Intravenous  Once 06/23/18 1715 06/24/18 1133       Objective: Vitals:   06/30/18 0930 06/30/18 1415 06/30/18 2121 07/01/18 0612  BP: 134/82 136/87 (!) 144/84 (!) 145/78  Pulse: 98 90 84 81  Resp:  16 20 19   Temp:  99.1 F (37.3 C) 98.8 F (37.1 C) 98.9 F (37.2 C)  TempSrc:  Oral Oral Oral  SpO2:  100% 94% 96%  Weight:    71.7 kg  Height:        Intake/Output Summary (Last 24 hours) at 07/01/2018 1232 Last data filed at 07/01/2018 1205 Gross per 24 hour  Intake 2580 ml  Output 1162 ml  Net 1418 ml   Filed Weights   06/29/18 0541 06/30/18 0614 07/01/18 0612  Weight: 72.8 kg 71.8 kg 71.7 kg   Weight change: -0.1 kg  Body mass index is 25.51 kg/m.  Intake/Output from previous day: 05/06 0701 - 05/07 0700 In: 2474.2 [P.O.:600; I.V.:1425; IV Piggyback:449.2] Out: 452 [Urine:425; Drains:27] Intake/Output this shift: Total I/O In: 480 [P.O.:480] Out: 710 [Urine:700; Drains:10]  Examination:  General exam: Appears calm and comfortable, elderly,frail HEENT:PERRL,Oral mucosa moist, Ear/Nose normal on gross exam Respiratory system: Bilateral equal air entry, normal vesicular breath sounds, no wheezes or crackles  Cardiovascular system: S1 & S2 heard,No JVD, murmurs. Gastrointestinal system: Abdomen is  Soft, full, non tender, BS +, JP drain + .  Incision site is clean dry and intact. Nervous System:Alert and oriented. No focal neurological deficits/moving extremities, sensation intact. Extremities: No edema, no clubbing, distal peripheral pulses palpable. Skin: No rashes, lesions, no icterus MSK: Normal muscle bulk,tone ,power  Medications:  Scheduled Meds: . amLODipine  2.5 mg Oral Daily  . atenolol  50 mg Oral Daily   And  . hydrochlorothiazide  25 mg Oral Daily  . enoxaparin (LOVENOX) injection  40 mg Subcutaneous Q24H  . lip balm  1 application  Topical BID  . pantoprazole  40 mg Oral Daily  . rosuvastatin  5 mg Oral Once per day on Mon Wed Fri   Continuous Infusions: . ampicillin-sulbactam (UNASYN) IV 3 g (07/01/18 0247)  . lactated ringers 75 mL/hr at 07/01/18 0850  . methocarbamol (ROBAXIN) IV    . ondansetron (ZOFRAN) IV      Data Reviewed: I have personally reviewed following labs and imaging studies  CBC: Recent Labs  Lab 06/27/18 0420 06/28/18 0355 06/29/18 0307 06/30/18 0349 07/01/18 0351  WBC 11.6* 10.8* 10.4 12.1* 15.3*  HGB 11.7* 12.1 11.7* 12.3 12.3  HCT 36.2 36.8 37.4 39.7 39.0  MCV 93.1 91.5 92.6 93.6 93.3  PLT 244 272 301 360 637   Basic Metabolic Panel: Recent Labs  Lab 06/25/18 0327 06/26/18 0312 06/27/18 0420 06/28/18 0355 06/29/18 0307 06/30/18 0349  NA 137 137 137 136 138 138  K 2.8* 3.6 3.0* 3.0* 3.6 3.8  CL 102 98 98 100 105 104  CO2 24 29 27 29 24 26   GLUCOSE 100* 82 67* 109* 116* 99  BUN 30* 24* 18 10 6* 6*  CREATININE  1.02* 0.80 0.71 0.65 0.70 0.66  CALCIUM 8.6* 8.7* 8.6* 8.4* 8.5* 8.6*  MG 2.1  --  1.7 1.8 1.6*  --    GFR: Estimated Creatinine Clearance: 51.2 mL/min (by C-G formula based on SCr of 0.66 mg/dL). Liver Function Tests: No results for input(s): AST, ALT, ALKPHOS, BILITOT, PROT, ALBUMIN in the last 168 hours. No results for input(s): LIPASE, AMYLASE in the last 168 hours. No results for input(s): AMMONIA in the last 168 hours. Coagulation Profile: No results for input(s): INR, PROTIME in the last 168 hours. Cardiac Enzymes: No results for input(s): CKTOTAL, CKMB, CKMBINDEX, TROPONINI in the last 168 hours. BNP (last 3 results) No results for input(s): PROBNP in the last 8760 hours. HbA1C: No results for input(s): HGBA1C in the last 72 hours. CBG: No results for input(s): GLUCAP in the last 168 hours. Lipid Profile: No results for input(s): CHOL, HDL, LDLCALC, TRIG, CHOLHDL, LDLDIRECT in the last 72 hours. Thyroid Function Tests: No results for input(s):  TSH, T4TOTAL, FREET4, T3FREE, THYROIDAB in the last 72 hours. Anemia Panel: No results for input(s): VITAMINB12, FOLATE, FERRITIN, TIBC, IRON, RETICCTPCT in the last 72 hours. Sepsis Labs: No results for input(s): PROCALCITON, LATICACIDVEN in the last 168 hours.  Recent Results (from the past 240 hour(s))  Urine culture     Status: Abnormal   Collection Time: 06/23/18  5:19 PM  Result Value Ref Range Status   Specimen Description   Final    URINE, RANDOM Performed at [facility], [address] [place], [place] [address]    Special Requests   Final    NONE Performed at [facility], [address] [place], [place] [address]    Culture >=100,000 COLONIES/mL ESCHERICHIA COLI (A)  Final   Report Status 06/26/2018 FINAL  Final   Organism ID, Bacteria ESCHERICHIA COLI (A)  Final      Susceptibility   Escherichia coli - MIC*    AMPICILLIN >=32 RESISTANT Resistant     CEFAZOLIN 32 INTERMEDIATE Intermediate     CEFTRIAXONE <=1 SENSITIVE Sensitive     CIPROFLOXACIN >=4 RESISTANT Resistant     GENTAMICIN <=1 SENSITIVE Sensitive     IMIPENEM <=0.25 SENSITIVE Sensitive     NITROFURANTOIN <=16 SENSITIVE Sensitive     TRIMETH/SULFA <=20 SENSITIVE Sensitive     AMPICILLIN/SULBACTAM >=32 RESISTANT Resistant     PIP/TAZO >=128 RESISTANT Resistant     Extended ESBL NEGATIVE Sensitive     * >=100,000 COLONIES/mL ESCHERICHIA COLI  Blood culture (routine x 2)     Status: Abnormal   Collection Time: 06/23/18  5:20 PM  Result Value Ref Range Status   Specimen Description   Final    BLOOD BLOOD LEFT FOREARM Performed at [facility], [address] [place], [place] [address]    Special Requests   Final    BOTTLES DRAWN AEROBIC AND ANAEROBIC Blood Culture adequate volume Performed at [facility], [address] [place], [place] [address]    Culture  Setup Time   Final    GRAM POSITIVE RODS ANAEROBIC BOTTLE ONLY CRITICAL RESULT CALLED TO, READ  BACK BY AND VERIFIED WITHKeturah Barre [facility] PHARMD 1935 06/25/18 A BROWNING Performed at [facility], [address], [place], [place] [address]    Culture EUBACTERIUM LIMOSUM (A)  Final   Report Status 06/30/2018 FINAL  Final  Blood culture (routine x 2)     Status: Abnormal  Collection Time: 06/23/18  6:10 PM  Result Value Ref Range Status   Specimen Description   Final    BLOOD BLOOD RIGHT ARM Performed at [facility], [address] [place], [place] [address]    Special Requests   Final    BOTTLES DRAWN AEROBIC AND ANAEROBIC Blood Culture adequate volume Performed at [facility], [address] [place], [place] [address]    Culture  Setup Time   Final    GRAM POSITIVE RODS ANAEROBIC BOTTLE ONLY CRITICAL VALUE NOTED.  VALUE IS CONSISTENT WITH PREVIOUSLY REPORTED AND CALLED VALUE. Performed at [facility], [place] [address] [place], [place] [address]    Culture EUBACTERIUM LIMOSUM (A)  Final   Report Status 06/30/2018 FINAL  Final  SARS Coronavirus 2 [facility] order, Performed in [facility] hospital lab)     Status: None   Collection Time: 06/23/18  7:14 PM  Result Value Ref Range Status   SARS Coronavirus 2 NEGATIVE NEGATIVE Final    Comment: (NOTE) If result is NEGATIVE SARS-CoV-2 target nucleic acids are NOT DETECTED. The SARS-CoV-2 RNA is generally detectable in upper and lower  respiratory specimens during the acute phase of infection. The lowest  concentration of SARS-CoV-2 viral copies this assay can detect is 250  copies / mL. A negative result does not preclude SARS-CoV-2 infection  and should not be used as the sole basis for treatment or other  patient management decisions.  A negative result may occur with  improper specimen collection / handling, submission of specimen other  than nasopharyngeal swab, presence of viral mutation(s) within the  areas targeted by this assay, and inadequate number of viral copies  (<250 copies / mL). A  negative result must be combined with clinical  observations, patient history, and epidemiological information. If result is POSITIVE SARS-CoV-2 target nucleic acids are DETECTED. The SARS-CoV-2 RNA is generally detectable in upper and lower  respiratory specimens dur ing the acute phase of infection.  Positive  results are indicative of active infection with SARS-CoV-2.  Clinical  correlation with patient history and other diagnostic information is  necessary to determine patient infection status.  Positive results do  not rule out bacterial infection or co-infection with other viruses. If result is PRESUMPTIVE POSTIVE SARS-CoV-2 nucleic acids MAY BE PRESENT.   A presumptive positive result was obtained on the submitted specimen  and confirmed on repeat testing.  While 2019 novel coronavirus  (SARS-CoV-2) nucleic acids may be present in the submitted sample  additional confirmatory testing may be necessary for epidemiological  and / or clinical management purposes  to differentiate between  SARS-CoV-2 and other Sarbecovirus currently known to infect humans.  If clinically indicated additional testing with an alternate test  methodology [phone]) is advised. The SARS-CoV-2 RNA is generally  detectable in upper and lower respiratory sp ecimens during the acute  phase of infection. The expected result is Negative. Fact Sheet for Patients:  StrictlyIdeas.no Fact Sheet for Healthcare Providers: BankingDealers.co.za This test is not yet approved or cleared by the [place] FDA and has been authorized for detection and/or diagnosis of SARS-CoV-2 by FDA under an Emergency Use Authorization (EUA).  This EUA will remain in effect (meaning this test can be used) for the duration of the COVID-19 declaration under Section 564(b)(1) of the Act, 21 U.S.C. section 360bbb-3(b)(1), unless the authorization is terminated or revoked sooner. Performed  at [facility], [place] [address], [place], [place]  [address]   MRSA PCR Screening     Status: None   Collection Time: 06/24/18  2:11 AM  Result Value Ref Range Status   MRSA by PCR NEGATIVE NEGATIVE Final    Comment:        The GeneXpert MRSA Assay (FDA approved for NASAL specimens only), is one component of a comprehensive MRSA colonization surveillance program. It is not intended to diagnose MRSA infection nor to guide or monitor treatment for MRSA infections. Performed at [facility], [place] [address] [place], [place] [address]   Culture, blood (routine x 2)     Status: None (Preliminary result)   Collection Time: 06/29/18 11:51 AM  Result Value Ref Range Status   Specimen Description   Final    BLOOD RIGHT ARM Performed at [place] [address] [place] [address] [place], [place] [address]    Special Requests   Final    BOTTLES DRAWN AEROBIC AND ANAEROBIC Blood Culture adequate volume Performed at [place] [address] [place], [place] [address]    Culture   Final    NO GROWTH 2 DAYS Performed at [place] [address] [place], [place] [address]    Report Status PENDING  Incomplete  Culture, blood (routine x 2)     Status: None (Preliminary result)   Collection Time: 06/29/18 11:51 AM  Result Value Ref Range Status   Specimen Description   Final    BLOOD RIGHT HAND Performed at [place] [address] [place], [place] [address]    Special Requests   Final    BOTTLES DRAWN AEROBIC ONLY Blood Culture results may not be optimal due to an inadequate volume of blood received in culture bottles Performed at [place] [address] [place], [place] [address]    Culture   Final    NO GROWTH 2 DAYS Performed at [facility] [address], [place], [place] [address]    Report Status PENDING  Incomplete      Radiology Studies: No results  found.    LOS: 8 days   Time spent: More than 50% of that time was spent in counseling and/or coordination of care.  Antonieta Pert, MD [facility]  07/01/2018, 12:32 PM

## 2018-07-01 NOTE — Progress Notes (Signed)
Patient ID: Latoya Curry, female   DOB: April 29, 1930, 85 y.o.   MRN: [id number]   Patient has 4 cm pelvic abscess despite the drain. Will have to consult IR I discussed this with her and will call her daughter

## 2018-07-01 NOTE — Progress Notes (Signed)
Occupational Therapy Treatment Patient Details Name: Latoya Curry MRN: [id number] DOB: Jul 15, 1930 Today's Date: 07/01/2018    History of present illness 85 yo female admitted to ED on 4/29 with abdominal pain and fever. Covid -. Pt s/p lap appendectomy, incisional hernia repair, and lysis of adhesions 4/30. PMH includes LBP, diverticulitis, DJD, HTN, obesity, osteopenia, recurrent UTIs, transient global amnesia, bilateral TKR, ascending thoracic aortic aneurysm.    OT comments    Follow Up Recommendations  Home health OT;Supervision - Intermittent    Equipment Recommendations  3 in 1 bedside commode    Recommendations for Other Services      Precautions / Restrictions Precautions Precautions: Fall Precaution Comments: recent ABD surgery/drain Restrictions Weight Bearing Restrictions: No       Mobility Bed Mobility Overal bed mobility: Needs Assistance Bed Mobility: Supine to Sit     Supine to sit: Min assist        Transfers Overall transfer level: Needs assistance Equipment used: Rolling walker (2 wheeled) Transfers: Sit to/from [facility] Sit to Stand: Supervision;Min guard Stand pivot transfers: Supervision;Min guard            Balance Overall balance assessment: Mild deficits observed, not formally tested                                         ADL either performed or assessed with clinical judgement   ADL Overall ADL's : Needs assistance/impaired Eating/Feeding: Set up;Sitting   Grooming: Set up;Standing       Lower Body Bathing: Minimal assistance;Sit to/from stand;Cueing for sequencing;Cueing for safety   Upper Body Dressing : Set up;Sitting       Toilet Transfer: RW;Comfort height toilet;Set up;BSC   Toileting- Clothing Manipulation and Hygiene: Min guard;Sit to/from stand;Cueing for sequencing;Cueing for safety       Functional mobility during ADLs: Min guard;Rolling walker General ADL Comments:  daugther will A as needed.  Pt fatigued quicker this OT visit     Vision Baseline Vision/History: Wears glasses Wears Glasses: At all times            Cognition Arousal/Alertness: Awake/alert Behavior During Therapy: WFL for tasks assessed/performed Overall Cognitive Status: Within Functional Limits for tasks assessed                                          Exercises     Shoulder Instructions       General Comments      Pertinent Vitals/ Pain       Pain Assessment: No/denies pain         Frequency  Min 2X/week        Progress Toward Goals  OT Goals(current goals can now be found in the care plan section)  Progress towards OT goals: Progressing toward goals     Plan Discharge plan remains appropriate       AM-PAC OT "6 Clicks" Daily Activity     Outcome Measure   Help from another person eating meals?: None Help from another person taking care of personal grooming?: None Help from another person toileting, which includes using toliet, bedpan, or urinal?: A Little Help from another person bathing (including washing, rinsing, drying)?: A Little Help from another person to put on and taking off regular upper body clothing?:  None Help from another person to put on and taking off regular lower body clothing?: A Little 6 Click Score: 21    End of Session Equipment Utilized During Treatment: Gait belt;Rolling walker  OT Visit Diagnosis: Other abnormalities of gait and mobility (R26.89);Muscle weakness (generalized) (M62.81);Pain Pain - part of body: (abdomen)   Activity Tolerance Patient tolerated treatment well   Patient Left with call bell/phone within reach;in chair;with chair alarm set   Nurse Communication Mobility status;Other (comment)(requesting assist getting changed)        Time: 7972-8206 OT Time Calculation (min): 24 min  Charges: OT General Charges $OT Visit: 1 Visit OT Treatments $Self Care/Home Management : 23-37  mins  Kari Baars, [place] [phone] Office- [phone], Edwena Felty D 07/01/2018, 2:43 PM

## 2018-07-01 NOTE — Progress Notes (Addendum)
IR consulted for intra-abdominal fluid collection aspiration and drainage.  Imaging reviewed by Dr. Laurence Ferrari who approves patient for procedure.   NPO p MN.  Hold lovenox. INR ordered by surgical team for tomorrow morning. Orders in place.  RN aware, states patient is consentable.   Plan for procedure in IR tomorrow as schedule allows.   Brynda Greathouse, MS RD PA-C

## 2018-07-02 ENCOUNTER — Inpatient Hospital Stay (HOSPITAL_COMMUNITY): Payer: Medicare HMO

## 2018-07-02 LAB — COMPREHENSIVE METABOLIC PANEL
ALT: 16 U/L (ref 0–44)
AST: 20 U/L (ref 15–41)
Albumin: 2.7 g/dL — ABNORMAL LOW (ref 3.5–5.0)
Alkaline Phosphatase: 42 U/L (ref 38–126)
Anion gap: 7 (ref 5–15)
BUN: 7 mg/dL — ABNORMAL LOW (ref 8–23)
CO2: 28 mmol/L (ref 22–32)
Calcium: 8.3 mg/dL — ABNORMAL LOW (ref 8.9–10.3)
Chloride: 102 mmol/L (ref 98–111)
Creatinine, Ser: 0.62 mg/dL (ref 0.44–1.00)
GFR calc Af Amer: >60 mL/min (ref >60)
GFR calc non Af Amer: >60 mL/min (ref >60)
Glucose, Bld: 95 mg/dL (ref 70–99)
Potassium: 3 mmol/L — ABNORMAL LOW (ref 3.5–5.1)
Sodium: 137 mmol/L (ref 135–145)
Total Bilirubin: 0.6 mg/dL (ref 0.3–1.2)
Total Protein: 5.5 g/dL — ABNORMAL LOW (ref 6.5–8.1)

## 2018-07-02 LAB — PROTIME-INR
INR: 1.4 — ABNORMAL HIGH (ref 0.8–1.2)
Prothrombin Time: 17.3 seconds — ABNORMAL HIGH (ref 11.4–15.2)

## 2018-07-02 LAB — CBC
HCT: 34.6 % — ABNORMAL LOW (ref 36.0–46.0)
Hemoglobin: 11.2 g/dL — ABNORMAL LOW (ref 12.0–15.0)
MCH: 30 pg (ref 26.0–34.0)
MCHC: 32.4 g/dL (ref 30.0–36.0)
MCV: 92.8 fL (ref 80.0–100.0)
Platelets: 338 10*3/uL (ref 150–400)
RBC: 3.73 MIL/uL — ABNORMAL LOW (ref 3.87–5.11)
RDW: 14.6 % (ref 11.5–15.5)
WBC: 10.9 10*3/uL — ABNORMAL HIGH (ref 4.0–10.5)
nRBC: 0 % (ref 0.0–0.2)

## 2018-07-02 LAB — PREALBUMIN: Prealbumin: 10.2 mg/dL — ABNORMAL LOW (ref 18–38)

## 2018-07-02 MED ORDER — MIDAZOLAM HCL 2 MG/2ML IJ SOLN
INTRAMUSCULAR | Status: AC
  Filled 2018-07-02: qty 4

## 2018-07-02 MED ORDER — ENOXAPARIN SODIUM 40 MG/0.4ML ~~LOC~~ SOLN
40.0000 mg | SUBCUTANEOUS | Status: DC
  Administered 2018-07-03: 40 mg via SUBCUTANEOUS
  Filled 2018-07-02: qty 0.4

## 2018-07-02 MED ORDER — SODIUM CHLORIDE 0.9% FLUSH
5.0000 mL | Freq: Three times a day (TID) | INTRAVENOUS | Status: DC
  Administered 2018-07-02 – 2018-07-03 (×4): 5 mL

## 2018-07-02 MED ORDER — FENTANYL CITRATE (PF) 100 MCG/2ML IJ SOLN
INTRAMUSCULAR | Status: AC
  Filled 2018-07-02: qty 2

## 2018-07-02 MED ORDER — POTASSIUM CHLORIDE CRYS ER 20 MEQ PO TBCR
20.0000 meq | EXTENDED_RELEASE_TABLET | Freq: Once | ORAL | Status: AC
  Administered 2018-07-02: 20 meq via ORAL
  Filled 2018-07-02: qty 1

## 2018-07-02 MED ORDER — FENTANYL CITRATE (PF) 100 MCG/2ML IJ SOLN
INTRAMUSCULAR | Status: AC | PRN
  Administered 2018-07-02 (×2): 50 ug via INTRAVENOUS

## 2018-07-02 MED ORDER — LIDOCAINE HCL (PF) 1 % IJ SOLN
INTRAMUSCULAR | Status: AC | PRN
  Administered 2018-07-02: 10 mL

## 2018-07-02 MED ORDER — MIDAZOLAM HCL 2 MG/2ML IJ SOLN
INTRAMUSCULAR | Status: AC | PRN
  Administered 2018-07-02: 1 mg via INTRAVENOUS
  Administered 2018-07-02 (×2): 0.5 mg via INTRAVENOUS

## 2018-07-02 MED ORDER — POTASSIUM CHLORIDE CRYS ER 20 MEQ PO TBCR
40.0000 meq | EXTENDED_RELEASE_TABLET | Freq: Once | ORAL | Status: AC
  Administered 2018-07-02: 16:00:00 40 meq via ORAL
  Filled 2018-07-02: qty 2

## 2018-07-02 NOTE — Procedures (Signed)
Interventional Radiology Procedure Note  Procedure: CT guided placement of a 64F drain into pelvic cul de sac fluid collection with aspiration of 15 mL turbid serosanguinous fluid.  No gross purulence. Sample sent for culture.   Complications: None  Estimated Blood Loss: None  Recommendations: - Follow cultures - Expect drain out-put to be very low, can likely remove drain when out put is scant for 48 hours.   Signed,  Criselda Peaches, MD

## 2018-07-02 NOTE — Care Management Important Message (Signed)
Important Message  Patient Details IM Letter given to Velva Harman RN to present to the Patient Name: Latoya Curry MRN: [id number] Date of Birth: May 25, 1930   Medicare Important Message Given:  Yes    Kerin Salen 07/02/2018, 11:29 AM

## 2018-07-02 NOTE — Progress Notes (Signed)
[facility]/Trauma Progress Note  8 Days Post-Op   Assessment/Plan Principal Problem: Acute gangrenous appendicitis s/p lap appendectomy 06/24/2018 Active Problems: Essential hypertension Hypokalemia Small bowel obstruction, partial, s/p lap adhesiolysis 06/24/2018 Incarcerated incisional hernia s/p primary repair 06/24/2018  Gangrenous appendicitis - S/P lap appy, LOA, primary incisional hernia repair, 04/30, Dr. Johney Maine - incision sites are without signs of infection, drain SS, no abdominal pain Intra-abdominal abscess - CT scan on 05/07 showed pelvic abscess - IR to place drain today Postop Ileus -resolved,tolerating diet, having bowel function Bacteremia- showed Eubacterium limosum - WBC down to 10.9  HBZ:JIRC diet VTE: SCD's,lovenox  VE:LFYBO 04/29-05/02; Rocephin & Flagyl 05/02-05/06; Unasyn 05/06-05/07; Merrem 05/07>> Foley:none Follow up:Dr. Johney Maine  DISPO:WBC down to 10.9 from 15.3 yesterday, no pain, afebrile, having bowel function. IR to place drain in pelvic abscess today.    LOS: 9 days    Subjective: CC: no complaints  Pt denies abdominal pain, nausea, vomiting or fevers. She has no complaints.   Objective: Vital signs in last 24 hours: Temp:  [98.4 F (36.9 C)-99.1 F (37.3 C)] 98.8 F (37.1 C) (05/08 0654) Pulse Rate:  [76-93] 88 (05/08 0654) Resp:  [15-18] 15 (05/08 0654) BP: (135-145)/(64-87) 145/87 (05/08 0654) SpO2:  [92 %-99 %] 92 % (05/08 0654) Last BM Date: 07/01/18  Intake/Output from previous day: 05/07 0701 - 05/08 0700 In: 2008.8 [P.O.:600; I.V.:1027; IV Piggyback:381.8] Out: 1810 [Urine:1800; Drains:10] Intake/Output this shift: No intake/output data recorded.  PE: Gen: Alert, NAD, pleasant, cooperative Pulm:Rate andeffort normal Abd: Soft, NT/ND, +BS, incisions C/D/I, drain with serosanguinous drainage,no peritonitis Skin: no rashes noted, warm and dry   Anti-infectives: Anti-infectives  (From admission, onward)   Start     Dose/Rate Route Frequency Ordered Stop   07/01/18 1700  meropenem (MERREM) 1 g in sodium chloride 0.9 % 100 mL IVPB     1 g 200 mL/hr over 30 Minutes Intravenous Every 8 hours 07/01/18 1533     06/30/18 1200  Ampicillin-Sulbactam (UNASYN) 3 g in sodium chloride 0.9 % 100 mL IVPB  Status:  Discontinued     3 g 200 mL/hr over 30 Minutes Intravenous Every 6 hours 06/30/18 1042 07/01/18 1531   06/26/18 1000  cefTRIAXone (ROCEPHIN) 2 g in sodium chloride 0.9 % 100 mL IVPB  Status:  Discontinued     2 g 200 mL/hr over 30 Minutes Intravenous Every 24 hours 06/26/18 0924 06/30/18 1042   06/26/18 1000  metroNIDAZOLE (FLAGYL) IVPB 500 mg  Status:  Discontinued     500 mg 100 mL/hr over 60 Minutes Intravenous Every 8 hours 06/26/18 0924 06/30/18 1042   06/24/18 1800  piperacillin-tazobactam (ZOSYN) IVPB 3.375 g  Status:  Discontinued     3.375 g 12.5 mL/hr over 240 Minutes Intravenous Every 8 hours 06/24/18 1603 06/26/18 0924   06/24/18 1115  cefoTEtan (CEFOTAN) 2 g in sodium chloride 0.9 % 100 mL IVPB     2 g 200 mL/hr over 30 Minutes Intravenous On call to O.R. 06/24/18 1102 06/24/18 1237   06/24/18 0200  piperacillin-tazobactam (ZOSYN) IVPB 3.375 g  Status:  Discontinued     3.375 g 12.5 mL/hr over 240 Minutes Intravenous Every 8 hours 06/24/18 0110 06/24/18 1603   06/23/18 1730  piperacillin-tazobactam (ZOSYN) IVPB 3.375 g     3.375 g 100 mL/hr over 30 Minutes Intravenous  Once 06/23/18 1715 06/24/18 1133      Lab Results:  Recent Labs    07/01/18 0351 07/02/18 0444  WBC 15.3* 10.9*  HGB 12.3 11.2*  HCT 39.0 34.6*  PLT 389 338   BMET Recent Labs    06/30/18 0349 07/02/18 0444  NA 138 137  K 3.8 3.0*  CL 104 102  CO2 26 28  GLUCOSE 99 95  BUN 6* 7*  CREATININE 0.66 0.62  CALCIUM 8.6* 8.3*   PT/INR Recent Labs    07/02/18 0444  LABPROT 17.3*  INR 1.4*   CMP     Component Value Date/Time   NA 137 07/02/2018 0444   NA 144  08/25/2017 0910   K 3.0 (L) 07/02/2018 0444   CL 102 07/02/2018 0444   CO2 28 07/02/2018 0444   GLUCOSE 95 07/02/2018 0444   BUN 7 (L) 07/02/2018 0444   BUN 25 08/25/2017 0910   CREATININE 0.62 07/02/2018 0444   CALCIUM 8.3 (L) 07/02/2018 0444   PROT 5.5 (L) 07/02/2018 0444   PROT 7.1 08/25/2017 0910   ALBUMIN 2.7 (L) 07/02/2018 0444   ALBUMIN 4.4 08/25/2017 0910   AST 20 07/02/2018 0444   ALT 16 07/02/2018 0444   ALKPHOS 42 07/02/2018 0444   BILITOT 0.6 07/02/2018 0444   BILITOT 0.6 08/25/2017 0910   GFRNONAA >60 07/02/2018 0444   GFRAA >60 07/02/2018 0444   Lipase  No results found for: LIPASE  Studies/Results: Ct Abdomen Pelvis W Contrast  Result Date: 07/01/2018 CLINICAL DATA:  Abdominal infection, suspect abdominal abscess, status post appendectomy EXAM: CT ABDOMEN AND PELVIS WITH CONTRAST TECHNIQUE: Multidetector CT imaging of the abdomen and pelvis was performed using the standard protocol following bolus administration of intravenous contrast. CONTRAST:  168mL OMNIPAQUE IOHEXOL 300 MG/ML SOLN, additional oral enteric contrast COMPARISON:  CT abdomen pelvis, 06/23/2018 FINDINGS: Lower chest: No acute abnormality. Bibasilar scarring. Coronary artery calcifications. Hepatobiliary: No focal liver abnormality is seen. Calcified gallstone in the gallbladder fundus. No gallbladder wall thickening, or biliary dilatation. Pancreas: Unremarkable. No pancreatic ductal dilatation or surrounding inflammatory changes. Spleen: Normal in size without focal abnormality. Adrenals/Urinary Tract: Adrenal glands are unremarkable. Kidneys are normal, without renal calculi, focal lesion, or hydronephrosis. Air within the urinary bladder, likely secondary to catheterization. Stomach/Bowel: Stomach is within normal limits. No evidence of bowel wall thickening, distention, or inflammatory changes. Sigmoid diverticulosis. Vascular/Lymphatic: Mixed calcific atherosclerosis. No enlarged abdominal or pelvic  lymph nodes. Reproductive: Calcified fibroids.  Pessary present in vagina. Other: No abdominal wall hernia or abnormality. There is a rim enhancing fluid collection in the low pelvis posterior to the uterus measuring at least 4.6 by 3.7 cm (series 2, image 66). There is additional higher attenuation fluid about the proximal jejunum, adjacent to the course of a surgical drain (series 2, image 35). Musculoskeletal: No acute or significant osseous findings. IMPRESSION: 1. Status post interval appendectomy. There is a rim enhancing fluid collection in the low pelvis posterior to the uterus measuring at least 4.6 by 3.7 cm, this appearance concerning for abscess (series 2, image 66). There is additional higher attenuation fluid about the proximal jejunum, adjacent to the course of a surgical drain, which may represent hematoma or seroma (series 2, image 35). 2.  Other chronic and incidental findings as detailed above. Electronically Signed   By: Eddie Candle M.D.   On: 07/01/2018 15:17      Kalman Drape , [facility] Surgery 07/02/2018, 8:45 AM  Pager: [phone] Mon-Wed, Friday 7:00am-4:30pm Thurs 7am-11:30am  Consults: [phone]

## 2018-07-02 NOTE — Consult Note (Signed)
Chief Complaint: Patient was seen in consultation today for intra-abdominal fluid collection  Referring Physician(s): Dr. Ninfa Linden  Supervising Physician: Jacqulynn Cadet  Patient Status: [facility] - In-pt  History of Present Illness: Latoya Curry is a 85 y.o. female s/p lap appendectomy 06/24/18 for acute gangrenous appendicitis. A JP drain was left in place and remains today.  She continues to receive treatment for her bacteremia.  Despite IV antibiotics, she continued to have elevated WBC prompting additional abdominal imaging.   CT Abdomen/Pelvis 07/02/18 showed: 1. Status post interval appendectomy. There is a rim enhancing fluid collection in the low pelvis posterior to the uterus measuring at least 4.6 by 3.7 cm, this appearance concerning for abscess (series 2, image 66). There is additional higher attenuation fluid about the proximal jejunum, adjacent to the course of a surgical drain, which may represent hematoma or seroma (series 2, image 35).  IR consulted for intra-abdominal fluid collection aspiration and drainage.  Case reviewed by Dr. Laurence Ferrari who approves patient for procedure.   Patient brought to radiology department for procedure today. She denies abdominal pain, nausea, or vomiting.   Past Medical History:  Diagnosis Date   Chronic low back pain    Diverticulitis    DJD (degenerative joint disease) of knee    Hypercholesteremia    Hypertension    Neurodermatitis    Obesity    Osteopenia    Recurrent UTI    Thoracic aortic aneurysm ([place])    Transient global amnesia     Past Surgical History:  Procedure Laterality Date   COLON RESECTION SIGMOID  2004   Dr Dalbert Batman   EXPLORATORY LAPAROTOMY  2004   r/o pancreas mass.  Dr Dalbert Batman   LAPAROSCOPIC APPENDECTOMY N/A 06/24/2018   Procedure: ACUTE GANGRENOUS APPENDICITIS WITH ABSCESS, PARTIAL SMALL BOWEL OBSTRUCTION, INCISIONAL HERNIA REPAIR, LYSIS OF ADHESIONS;  Surgeon: Michael Boston, MD;   Location: [facility] ORS;  Service: General;  Laterality: N/A;   TOTAL KNEE ARTHROPLASTY     bilaterallly    Allergies: Codeine; Bactrim [sulfamethoxazole-trimethoprim]; Zestril [lisinopril]; and Zocor [simvastatin]  Medications: Prior to Admission medications   Medication Sig Start Date End Date Taking? Authorizing Provider  amLODipine (NORVASC) 2.5 MG tablet Take 2.5 mg by mouth daily.   Yes [provider]  aspirin 81 MG tablet Take 81 mg by mouth See admin instructions. Monday, Wednesday and Friday   Yes [provider]  atenolol-chlorthalidone (TENORETIC) 50-25 MG per tablet Take 1 tablet by mouth daily.   Yes [provider]  cetirizine (ZYRTEC) 10 MG tablet Take 10 mg by mouth daily.   Yes [provider]  cholecalciferol (VITAMIN D) 1000 UNITS tablet Take 1,000 Units by mouth daily.   Yes [provider]  citalopram (CELEXA) 20 MG tablet Take 20 mg by mouth daily.   Yes [provider]  omeprazole (PRILOSEC) 20 MG capsule Take 30- 60 min before your first and last meals of the day 09/23/11  Yes Tanda Rockers, MD  potassium chloride (K-DUR) 10 MEQ tablet Take 10 mEq by mouth daily. 03/17/18  Yes [provider]  rosuvastatin (CRESTOR) 10 MG tablet Take 5 mg by mouth See admin instructions. Monday, Wednesday and Friday   Yes [provider]  TOVIAZ 8 MG TB24 tablet Take 8 mg by mouth daily. 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  Social Designer, fashion/clothing strain: Not on file   Food insecurity:    Worry: Not on file    Inability: Not  on file   Transportation needs:    Medical: Not on file    Non-medical: Not on file  Tobacco Use   Smoking status: Never Smoker   Smokeless tobacco: Never Used  Substance and Sexual Activity   Alcohol use: No   Drug use: No   Sexual activity: Not on file  Lifestyle   Physical activity:    Days per week: Not on file    Minutes per session: Not on file   Stress: Not on file  Relationships   Social connections:    Talks on phone: Not on file    Gets together: Not on file    Attends religious service: Not on file    Active member of club or organization: Not on file    Attends meetings of clubs or organizations: Not on file    Relationship status: Not on file  Other Topics Concern   Not on file  Social History Narrative   Not on file     Review of Systems: A 12 point ROS discussed and pertinent positives are indicated in the HPI above.  All other systems are negative.  Review of Systems  Constitutional: Negative for fatigue and fever.  Respiratory: Negative for cough and shortness of breath.   Cardiovascular: Negative for chest pain.  Gastrointestinal: Positive for abdominal pain, nausea and vomiting. Negative for constipation.  Musculoskeletal: Negative for back pain.  Psychiatric/Behavioral: Negative for behavioral problems and confusion.    Vital Signs: BP (!) 156/90 (BP Location: Right Arm)    Pulse (!) 103    Temp 98.7 F (37.1 C) (Oral)    Resp 15    Ht 5\' 6"  (1.676 m)    Wt 158 lb 1.1 oz (71.7 kg)    SpO2 100%    BMI 25.51 kg/m   Physical Exam Vitals signs and nursing note reviewed.  Constitutional:      Appearance: She is well-developed.  Cardiovascular:     Rate and Rhythm: Normal rate and regular rhythm.     Heart sounds: No murmur. No friction rub. No gallop.   Pulmonary:     Effort: Pulmonary effort is normal. No respiratory distress.     Breath sounds: Normal breath sounds.  Abdominal:     General: Abdomen is flat. There is no distension.       Tenderness: There is abdominal tenderness (with palpation).  Neurological:     Mental Status: She is alert.      MD Evaluation Airway: WNL Heart: WNL Abdomen: WNL Chest/ Lungs: WNL ASA  Classification: 3 Mallampati/Airway Score: Two   Imaging: Ct Abdomen Pelvis W Contrast  Result Date: 07/01/2018 CLINICAL DATA:  Abdominal infection, suspect abdominal abscess, status post appendectomy EXAM: CT ABDOMEN AND PELVIS WITH CONTRAST TECHNIQUE: Multidetector CT imaging of the abdomen and pelvis was performed using the standard protocol following bolus administration of intravenous contrast. CONTRAST:  168mL OMNIPAQUE IOHEXOL 300 MG/ML SOLN, additional oral enteric contrast COMPARISON:  CT abdomen pelvis, 06/23/2018 FINDINGS: Lower chest: No acute abnormality.  Bibasilar scarring. Coronary artery calcifications. Hepatobiliary: No focal liver abnormality is seen. Calcified gallstone in the gallbladder fundus. No gallbladder wall thickening, or biliary dilatation. Pancreas: Unremarkable. No pancreatic ductal dilatation or surrounding inflammatory changes. Spleen: Normal in size without focal abnormality. Adrenals/Urinary Tract: Adrenal glands are unremarkable. Kidneys are normal, without renal calculi, focal lesion, or hydronephrosis. Air within the urinary bladder, likely secondary to catheterization. Stomach/Bowel: Stomach is within normal limits. No evidence of bowel wall thickening, distention, or inflammatory changes. Sigmoid diverticulosis. Vascular/Lymphatic: Mixed calcific atherosclerosis. No enlarged abdominal or pelvic lymph nodes. Reproductive: Calcified fibroids.  Pessary present in vagina. Other: No abdominal wall hernia or abnormality. There is a rim enhancing fluid collection in the low pelvis posterior to the uterus measuring at least 4.6 by 3.7 cm (series 2, image 66). There is additional higher attenuation fluid about the proximal jejunum, adjacent to the course of a surgical drain  (series 2, image 35). Musculoskeletal: No acute or significant osseous findings. IMPRESSION: 1. Status post interval appendectomy. There is a rim enhancing fluid collection in the low pelvis posterior to the uterus measuring at least 4.6 by 3.7 cm, this appearance concerning for abscess (series 2, image 66). There is additional higher attenuation fluid about the proximal jejunum, adjacent to the course of a surgical drain, which may represent hematoma or seroma (series 2, image 35). 2.  Other chronic and incidental findings as detailed above. Electronically Signed   By: Eddie Candle M.D.   On: 07/01/2018 15:17   Ct Abdomen Pelvis W Contrast  Result Date: 06/23/2018 CLINICAL DATA:  85 year old female with lower abdominal pain EXAM: CT ABDOMEN AND PELVIS WITH CONTRAST TECHNIQUE: Multidetector CT imaging of the abdomen and pelvis was performed using the standard protocol following bolus administration of intravenous contrast. CONTRAST:  146mL OMNIPAQUE IOHEXOL 300 MG/ML  SOLN COMPARISON:  None. FINDINGS: Lower chest: No acute finding of the lower chest. Atelectasis/scarring. Hepatobiliary: Unremarkable liver. Cholelithiasis. No associated inflammatory changes. Pancreas: Punctate calcifications of pancreas with no inflammatory changes. Spleen: Unremarkable Adrenals/Urinary Tract: Unremarkable adrenal glands. No hydronephrosis or nephrolithiasis. Unremarkable course the bilateral ureters. Unremarkable urinary bladder. Stomach/Bowel: Unremarkable stomach. Small bowel decompressed without abnormal distention. No focal wall thickening of small bowel. Inflammatory changes of the appendix which is dilated, fluid-filled, with fecalith at the base. No evidence of perforation or abscess. Extensive diverticular disease without evidence of acute diverticulitis. Vascular/Lymphatic: Atherosclerosis.  No adenopathy. Reproductive: Fibroid uterus with internal calcifications, otherwise unremarkable. Pessary in place. Other:  Reactive fluid within the dependent pelvis.  No hernia. Musculoskeletal: Compression fracture of T12 is unchanged from previous CT. No acute displaced fracture. Multilevel degenerative changes. IMPRESSION: Acute uncomplicated appendicitis. These results were discussed by telephone at the time of interpretation on 06/23/2018 at 8:55 pm with Dr. Gareth Morgan. Reactive free fluid within the dependent pelvis. Diverticular disease without evidence of acute diverticulitis. Cholelithiasis. Aortic Atherosclerosis (ICD10-I70.0). Ancillary findings as above. Electronically Signed   By: Corrie Mckusick D.O.   On: 06/23/2018 20:56   Dg Chest Portable 1 View  Result Date: 06/23/2018 CLINICAL DATA:  Fever. Hypoxia EXAM: PORTABLE CHEST 1 VIEW COMPARISON:  06/12/2016 FINDINGS: Stable mild cardiomegaly. Aortic atherosclerosis. Both lungs are clear. IMPRESSION: Stable mild cardiomegaly. No active lung disease. Electronically Signed   By: Earle Gell M.D.   On: 06/23/2018 18:24    Labs:  CBC: Recent Labs    06/29/18 0307 06/30/18 0349 07/01/18 0351 07/02/18 0444  WBC 10.4 12.1* 15.3* 10.9*  HGB 11.7* 12.3 12.3 11.2*  HCT  37.4 39.7 39.0 34.6*  PLT 301 360 389 338    COAGS: Recent Labs    07/02/18 0444  INR 1.4*    BMP: Recent Labs    06/28/18 0355 06/29/18 0307 06/30/18 0349 07/02/18 0444  NA 136 138 138 137  K 3.0* 3.6 3.8 3.0*  CL 100 105 104 102  CO2 29 24 26 28   GLUCOSE 109* 116* 99 95  BUN 10 6* 6* 7*  CALCIUM 8.4* 8.5* 8.6* 8.3*  CREATININE 0.65 0.70 0.66 0.62  GFRNONAA >60 >60 >60 >60  GFRAA >60 >60 >60 >60    LIVER FUNCTION TESTS: Recent Labs    08/25/17 0910 06/23/18 1719 07/02/18 0444  BILITOT 0.6 1.7* 0.6  AST 26 29 20   ALT 10 16 16   ALKPHOS 72 58 42  PROT 7.1 7.6 5.5*  ALBUMIN 4.4 4.0 2.7*    TUMOR MARKERS: No results for input(s): AFPTM, CEA, CA199, CHROMGRNA in the last 8760 hours.  Assessment and Plan: Intra-abdominal fluid collection Patient with  suspected abscess s/p lap appendectomy 4/30.  Case reviewed and approved by Dr. Laurence Ferrari.  Plan to proceed with TG drain placement today.  This was discussed in detail with patient. She understands she will likely discharge home with drain in place. She has been NPO.  Lovenox held.   Risks and benefits discussed with the patient including bleeding, infection, damage to adjacent structures, bowel perforation/fistula connection, and sepsis.  All of the patient's questions were answered, patient is agreeable to proceed. Consent signed and in chart.  Thank you for this interesting consult.  I greatly enjoyed meeting Latoya Curry and look forward to participating in their care.  A copy of this report was sent to the requesting provider on this date.  Electronically Signed: Docia Barrier, PA 07/02/2018, 3:00 PM   I spent a total of 40 Minutes    in face to face in clinical consultation, greater than 50% of which was counseling/coordinating care for intra-abdominal fluid collection.

## 2018-07-02 NOTE — Progress Notes (Signed)
PROGRESS NOTE    Latoya Curry  VVO:[id number] DOB: Jan 12, 1931 DOA: 06/23/2018 PCP: Josetta Huddle, MD   Brief Narrative: 85 year old female with hypertension, hyperlipidemia, ascending thoracic aortic aneurysm, hypothyroidism status post RAI therapy with anxiety presented to [facility] with 2 days of lower abdominal pain nausea, intermittent fevers on 06/24/18.  Patient had tele  visit by her PCP who recommended evaluation to the ER due to prior history of diverticulitis.  Patient was admitted for acute appendicitis status post surgery 06/24/2018. Pt was waiting for return of bowel function with her staff ileus.  Patient is not tolerating diet.  She has leukocytosis worsening 5/6 and CT abd planned.  Subjective: Seen and examined this morning. Patient is going for IR guided drain placement for abscess this morning She feels well denies nausea vomiting or abdominal fever or chills.  WBC downtrending.  Assessment & Plan:   Acute gangrenous appendicitis s/p lap appendectomy 06/24/2018 with primary incisional hernia repair: Clinically stable, tolerating diet.  JP drain in place.  Appreciate surgery input on board. Ct showed intra-abdominal abscess but clinically stable. cont antibiotis as below 10-14 day course.  Intra-abdominal abscess.  Going for IR guided drain placement.  Leukocytosis downtrending.  Continue antibiotics that were changed to meropenem yesterday  Bacteremia with gram-positive rods- Eubacterium Limosum in anaerobic bottle: This is likely secondary to #1, discussed with the pharmacist antibiotic changed to Unasyn IV 5/6  But starte on meropenem 5/7 due to abscess, Once drain in place can convert to p.o. Augmentin on discharge to complete the course.  Repeat blood culture 06/29/18 is NGTD. Recent Labs  Lab 06/28/18 0355 06/29/18 0307 06/30/18 0349 07/01/18 0351 07/02/18 0444  WBC 10.8* 10.4 12.1* 15.3* 10.9*   Asymptomatic bacteriuria, urine culture with E. coli.   Patient off antibiotics  Postop ileus: resolved, on po and tolerating well  Essential hypertension: BP controlled on home amlodipine, atenolol and HCTZ  Hypokalemia/hypomagnesemia:replaced  Ascending thoracic aortic aneurysm followed by cardiothoracic surgery Dr. Darcey Nora stable 5.4 cm continue medical therapy with blood pressure optimization.  Hyperthyroidism with history of RAI therapy in January 2020 followed by PCP.  HLD- cont her statin  Anxiety/depression mood is stable continue Celexa.  COVID 19 is negative  DVT prophylaxis:  Cont sq lovenox- on h0ld for ir drain. Code Status: Full Family Communication:  Pt aware about plan. I had called and updated her daughter 5/6 and all questions answered. She plans to take patient to her home until she is better then have her return to [facility].  On Drain in place and okay with IR will probably discharge her next 24 to 48 hours.  Surgery okay with this plan.  Discussed with surgery team. I called daughter to update- but no answer- will try again Disposition Plan: Remains inpatient pending clinical improvement.  Consultants:  gen surgery  Procedures:  Antimicrobials: Anti-infectives (From admission, onward)   Start     Dose/Rate Route Frequency Ordered Stop   07/01/18 1700  meropenem (MERREM) 1 g in sodium chloride 0.9 % 100 mL IVPB     1 g 200 mL/hr over 30 Minutes Intravenous Every 8 hours 07/01/18 1533     06/30/18 1200  Ampicillin-Sulbactam (UNASYN) 3 g in sodium chloride 0.9 % 100 mL IVPB  Status:  Discontinued     3 g 200 mL/hr over 30 Minutes Intravenous Every 6 hours 06/30/18 1042 07/01/18 1531   06/26/18 1000  cefTRIAXone (ROCEPHIN) 2 g in sodium chloride 0.9 % 100 mL IVPB  Status:  Discontinued     2 g 200 mL/hr over 30 Minutes Intravenous Every 24 hours 06/26/18 0924 06/30/18 1042   06/26/18 1000  metroNIDAZOLE (FLAGYL) IVPB 500 mg  Status:  Discontinued     500 mg 100 mL/hr over 60 Minutes Intravenous Every 8 hours  06/26/18 0924 06/30/18 1042   06/24/18 1800  piperacillin-tazobactam (ZOSYN) IVPB 3.375 g  Status:  Discontinued     3.375 g 12.5 mL/hr over 240 Minutes Intravenous Every 8 hours 06/24/18 1603 06/26/18 0924   06/24/18 1115  cefoTEtan (CEFOTAN) 2 g in sodium chloride 0.9 % 100 mL IVPB     2 g 200 mL/hr over 30 Minutes Intravenous On call to O.R. 06/24/18 1102 06/24/18 1237   06/24/18 0200  piperacillin-tazobactam (ZOSYN) IVPB 3.375 g  Status:  Discontinued     3.375 g 12.5 mL/hr over 240 Minutes Intravenous Every 8 hours 06/24/18 0110 06/24/18 1603   06/23/18 1730  piperacillin-tazobactam (ZOSYN) IVPB 3.375 g     3.375 g 100 mL/hr over 30 Minutes Intravenous  Once 06/23/18 1715 06/24/18 1133       Objective: Vitals:   07/01/18 0612 07/01/18 1333 07/01/18 2045 07/02/18 0654  BP: (!) 145/78 138/64 135/80 (!) 145/87  Pulse: 81 76 93 88  Resp: 19 18 18 15   Temp: 98.9 F (37.2 C) 98.4 F (36.9 C) 99.1 F (37.3 C) 98.8 F (37.1 C)  TempSrc: Oral Oral Oral Oral  SpO2: 96% 99% 95% 92%  Weight: 71.7 kg     Height:        Intake/Output Summary (Last 24 hours) at 07/02/2018 1137 Last data filed at 07/02/2018 0600 Gross per 24 hour  Intake 1768.76 ml  Output 1810 ml  Net -41.24 ml   Filed Weights   06/29/18 0541 06/30/18 0614 07/01/18 0612  Weight: 72.8 kg 71.8 kg 71.7 kg   Weight change:   Body mass index is 25.51 kg/m.  Intake/Output from previous day: 05/07 0701 - 05/08 0700 In: 2008.8 [P.O.:600; I.V.:1027; IV Piggyback:381.8] Out: 1810 [Urine:1800; Drains:10] Intake/Output this shift: No intake/output data recorded.  Examination: General exam: Calm, comfortable, on the bedside chair, elderly, frail but not in acute distress.   HEENT:Oral mucosa moist, Ear/Nose WNL grossly, dentition normal. Respiratory system: Bilateral equal air entry, no crackles and wheezing, no use of accessory muscle, non tender on palpation. Cardiovascular system: regular rate and rhythm, S1 & S2  heard, No JVD/murmurs. Gastrointestinal system: Abdomen soft, non-tender, JP drain + non-distended, BS +. Nervous System:Alert, awake and oriented at baseline. Able to move UE and LE, sensation intact. Extremities: No edema, distal peripheral pulses palpable.  Skin: No rashes,no icterus. MSK: Normal muscle bulk,tone, power  Medications:  Scheduled Meds:  amLODipine  2.5 mg Oral Daily   atenolol  50 mg Oral Daily   And   hydrochlorothiazide  25 mg Oral Daily   enoxaparin (LOVENOX) injection  40 mg Subcutaneous Q24H   lip balm  1 application Topical BID   pantoprazole  40 mg Oral Daily   potassium chloride  20 mEq Oral Once   potassium chloride  40 mEq Oral Once   rosuvastatin  5 mg Oral Once per day on Mon Wed Fri   sodium chloride flush  10-40 mL Intracatheter Q12H   Continuous Infusions:  lactated ringers 75 mL/hr at 07/02/18 1112   meropenem (MERREM) IV 1 g (07/02/18 0535)   methocarbamol (ROBAXIN) IV     ondansetron (ZOFRAN) IV  Data Reviewed: I have personally reviewed following labs and imaging studies  CBC: Recent Labs  Lab 06/28/18 0355 06/29/18 0307 06/30/18 0349 07/01/18 0351 07/02/18 0444  WBC 10.8* 10.4 12.1* 15.3* 10.9*  HGB 12.1 11.7* 12.3 12.3 11.2*  HCT 36.8 37.4 39.7 39.0 34.6*  MCV 91.5 92.6 93.6 93.3 92.8  PLT 272 301 360 389 357   Basic Metabolic Panel: Recent Labs  Lab 06/27/18 0420 06/28/18 0355 06/29/18 0307 06/30/18 0349 07/02/18 0444  NA 137 136 138 138 137  K 3.0* 3.0* 3.6 3.8 3.0*  CL 98 100 105 104 102  CO2 27 29 24 26 28   GLUCOSE 67* 109* 116* 99 95  BUN 18 10 6* 6* 7*  CREATININE 0.71 0.65 0.70 0.66 0.62  CALCIUM 8.6* 8.4* 8.5* 8.6* 8.3*  MG 1.7 1.8 1.6*  --   --    GFR: Estimated Creatinine Clearance: 51.2 mL/min (by C-G formula based on SCr of 0.62 mg/dL). Liver Function Tests: Recent Labs  Lab 07/02/18 0444  AST 20  ALT 16  ALKPHOS 42  BILITOT 0.6  PROT 5.5*  ALBUMIN 2.7*   No results for  input(s): LIPASE, AMYLASE in the last 168 hours. No results for input(s): AMMONIA in the last 168 hours. Coagulation Profile: Recent Labs  Lab 07/02/18 0444  INR 1.4*   Cardiac Enzymes: No results for input(s): CKTOTAL, CKMB, CKMBINDEX, TROPONINI in the last 168 hours. BNP (last 3 results) No results for input(s): PROBNP in the last 8760 hours. HbA1C: No results for input(s): HGBA1C in the last 72 hours. CBG: No results for input(s): GLUCAP in the last 168 hours. Lipid Profile: No results for input(s): CHOL, HDL, LDLCALC, TRIG, CHOLHDL, LDLDIRECT in the last 72 hours. Thyroid Function Tests: No results for input(s): TSH, T4TOTAL, FREET4, T3FREE, THYROIDAB in the last 72 hours. Anemia Panel: No results for input(s): VITAMINB12, FOLATE, FERRITIN, TIBC, IRON, RETICCTPCT in the last 72 hours. Sepsis Labs: No results for input(s): PROCALCITON, LATICACIDVEN in the last 168 hours.  Recent Results (from the past 240 hour(s))  Urine culture     Status: Abnormal   Collection Time: 06/23/18  5:19 PM  Result Value Ref Range Status   Specimen Description   Final    URINE, RANDOM Performed at [facility], [address] [place], [place] [address]    Special Requests   Final    NONE Performed at [facility], [address] [place], [place] [address]    Culture >=100,000 COLONIES/mL ESCHERICHIA COLI (A)  Final   Report Status 06/26/2018 FINAL  Final   Organism ID, Bacteria ESCHERICHIA COLI (A)  Final      Susceptibility   Escherichia coli - MIC*    AMPICILLIN >=32 RESISTANT Resistant     CEFAZOLIN 32 INTERMEDIATE Intermediate     CEFTRIAXONE <=1 SENSITIVE Sensitive     CIPROFLOXACIN >=4 RESISTANT Resistant     GENTAMICIN <=1 SENSITIVE Sensitive     IMIPENEM <=0.25 SENSITIVE Sensitive     NITROFURANTOIN <=16 SENSITIVE Sensitive     TRIMETH/SULFA <=20 SENSITIVE Sensitive     AMPICILLIN/SULBACTAM >=32 RESISTANT Resistant     PIP/TAZO >=128 RESISTANT Resistant      Extended ESBL NEGATIVE Sensitive     * >=100,000 COLONIES/mL ESCHERICHIA COLI  Blood culture (routine x 2)     Status: Abnormal   Collection Time: 06/23/18  5:20 PM  Result Value Ref Range Status   Specimen Description   Final  BLOOD BLOOD LEFT FOREARM Performed at [facility], [address] [place], [place] [address]    Special Requests   Final    BOTTLES DRAWN AEROBIC AND ANAEROBIC Blood Culture adequate volume Performed at [facility], [address] [place], [place] [address]    Culture  Setup Time   Final    GRAM POSITIVE RODS ANAEROBIC BOTTLE ONLY CRITICAL RESULT CALLED TO, READ BACK BY AND VERIFIED WITHKeturah Barre [facility] PHARMD 1933 06/25/18 A BROWNING Performed at [facility], [place] [address], [place], [place] [address]    Culture EUBACTERIUM LIMOSUM (A)  Final   Report Status 06/30/2018 FINAL  Final  Blood culture (routine x 2)     Status: Abnormal   Collection Time: 06/23/18  6:10 PM  Result Value Ref Range Status   Specimen Description   Final    BLOOD BLOOD RIGHT ARM Performed at [facility], [address] [place], [place] [address]    Special Requests   Final    BOTTLES DRAWN AEROBIC AND ANAEROBIC Blood Culture adequate volume Performed at [facility], [address] [place], [place] [address]    Culture  Setup Time   Final    GRAM POSITIVE RODS ANAEROBIC BOTTLE ONLY CRITICAL VALUE NOTED.  VALUE IS CONSISTENT WITH PREVIOUSLY REPORTED AND CALLED VALUE. Performed at [facility], [place] [address], [place], [place] [address]    Culture EUBACTERIUM LIMOSUM (A)  Final   Report Status 06/30/2018 FINAL  Final  SARS Coronavirus 2 [facility] order, Performed in [facility] hospital lab)     Status: None   Collection Time: 06/23/18  7:14 PM  Result Value Ref Range Status   SARS Coronavirus 2 NEGATIVE NEGATIVE Final    Comment: (NOTE) If result is NEGATIVE SARS-CoV-2 target nucleic acids are NOT  DETECTED. The SARS-CoV-2 RNA is generally detectable in upper and lower  respiratory specimens during the acute phase of infection. The lowest  concentration of SARS-CoV-2 viral copies this assay can detect is 250  copies / mL. A negative result does not preclude SARS-CoV-2 infection  and should not be used as the sole basis for treatment or other  patient management decisions.  A negative result may occur with  improper specimen collection / handling, submission of specimen other  than nasopharyngeal swab, presence of viral mutation(s) within the  areas targeted by this assay, and inadequate number of viral copies  (<250 copies / mL). A negative result must be combined with clinical  observations, patient history, and epidemiological information. If result is POSITIVE SARS-CoV-2 target nucleic acids are DETECTED. The SARS-CoV-2 RNA is generally detectable in upper and lower  respiratory specimens dur ing the acute phase of infection.  Positive  results are indicative of active infection with SARS-CoV-2.  Clinical  correlation with patient history and other diagnostic information is  necessary to determine patient infection status.  Positive results do  not rule out bacterial infection or co-infection with other viruses. If result is PRESUMPTIVE POSTIVE SARS-CoV-2 nucleic acids MAY BE PRESENT.   A presumptive positive result was obtained on the submitted specimen  and confirmed on repeat testing.  While 2019 novel coronavirus  (SARS-CoV-2) nucleic acids may be present in the submitted sample  additional confirmatory testing may be necessary for epidemiological  and / or clinical management purposes  to differentiate between  SARS-CoV-2 and other Sarbecovirus currently known to infect humans.  If clinically  indicated additional testing with an alternate test  methodology [phone]) is advised. The SARS-CoV-2 RNA is generally  detectable in upper and lower respiratory sp ecimens during  the acute  phase of infection. The expected result is Negative. Fact Sheet for Patients:  StrictlyIdeas.no Fact Sheet for Healthcare Providers: BankingDealers.co.za This test is not yet approved or cleared by the [place] FDA and has been authorized for detection and/or diagnosis of SARS-CoV-2 by FDA under an Emergency Use Authorization (EUA).  This EUA will remain in effect (meaning this test can be used) for the duration of the COVID-19 declaration under Section 564(b)(1) of the Act, 21 U.S.C. section 360bbb-3(b)(1), unless the authorization is terminated or revoked sooner. Performed at [facility], [place] [address], [place], [place] [address]   MRSA PCR Screening     Status: None   Collection Time: 06/24/18  2:11 AM  Result Value Ref Range Status   MRSA by PCR NEGATIVE NEGATIVE Final    Comment:        The GeneXpert MRSA Assay (FDA approved for NASAL specimens only), is one component of a comprehensive MRSA colonization surveillance program. It is not intended to diagnose MRSA infection nor to guide or monitor treatment for MRSA infections. Performed at [facility], [place] [address], [place], [place] [address]   Culture, blood (routine x 2)     Status: None (Preliminary result)   Collection Time: 06/29/18 11:51 AM  Result Value Ref Range Status   Specimen Description   Final    BLOOD RIGHT ARM Performed at [facility] [address] [place], [place] [address]    Special Requests   Final    BOTTLES DRAWN AEROBIC AND ANAEROBIC Blood Culture adequate volume Performed at [place] [address], [place], [place] [address]    Culture   Final    NO GROWTH 3 DAYS Performed at [facility], [place] [address], [place], [place] [address]    Report Status PENDING  Incomplete  Culture, blood (routine x 2)     Status: None (Preliminary result)   Collection  Time: 06/29/18 11:51 AM  Result Value Ref Range Status   Specimen Description   Final    BLOOD RIGHT HAND Performed at [facility] [address] [place], [place] [address]    Special Requests   Final    BOTTLES DRAWN AEROBIC ONLY Blood Culture results may not be optimal due to an inadequate volume of blood received in culture bottles Performed at [facility] [address] [place], [place] [address]    Culture   Final    NO GROWTH 3 DAYS Performed at [facility], [place] [address] [place], [place] [address]    Report Status PENDING  Incomplete      Radiology Studies: Ct Abdomen Pelvis W Contrast  Result Date: 07/01/2018 CLINICAL DATA:  Abdominal infection, suspect abdominal abscess, status post appendectomy EXAM: CT ABDOMEN AND PELVIS WITH CONTRAST TECHNIQUE: Multidetector CT imaging of the abdomen and pelvis was performed using the standard protocol following bolus administration of intravenous contrast. CONTRAST:  185mL OMNIPAQUE IOHEXOL 300 MG/ML SOLN, additional oral enteric contrast COMPARISON:  CT abdomen pelvis, 06/23/2018 FINDINGS: Lower chest: No acute abnormality. Bibasilar scarring. Coronary artery calcifications. Hepatobiliary: No focal liver abnormality is seen. Calcified gallstone in the gallbladder fundus. No gallbladder wall thickening, or biliary dilatation. Pancreas: Unremarkable. No pancreatic ductal dilatation or surrounding inflammatory changes. Spleen: Normal  in size without focal abnormality. Adrenals/Urinary Tract: Adrenal glands are unremarkable. Kidneys are normal, without renal calculi, focal lesion, or hydronephrosis. Air within the urinary bladder, likely secondary to catheterization. Stomach/Bowel: Stomach is within normal limits. No evidence of bowel wall thickening, distention, or inflammatory changes. Sigmoid diverticulosis. Vascular/Lymphatic: Mixed calcific atherosclerosis. No enlarged abdominal or pelvic lymph  nodes. Reproductive: Calcified fibroids.  Pessary present in vagina. Other: No abdominal wall hernia or abnormality. There is a rim enhancing fluid collection in the low pelvis posterior to the uterus measuring at least 4.6 by 3.7 cm (series 2, image 66). There is additional higher attenuation fluid about the proximal jejunum, adjacent to the course of a surgical drain (series 2, image 35). Musculoskeletal: No acute or significant osseous findings. IMPRESSION: 1. Status post interval appendectomy. There is a rim enhancing fluid collection in the low pelvis posterior to the uterus measuring at least 4.6 by 3.7 cm, this appearance concerning for abscess (series 2, image 66). There is additional higher attenuation fluid about the proximal jejunum, adjacent to the course of a surgical drain, which may represent hematoma or seroma (series 2, image 35). 2.  Other chronic and incidental findings as detailed above. Electronically Signed   By: Eddie Candle M.D.   On: 07/01/2018 15:17      LOS: 9 days   Time spent: More than 50% of that time was spent in counseling and/or coordination of care.  Antonieta Pert, MD [facility]  07/02/2018, 11:37 AM

## 2018-07-02 NOTE — Progress Notes (Signed)
Pharmacy Brief Note: Post-IR Procedure Anticoagulation Assessment  Patient underwent CT guided drain placement on 5/8  Bleeding risk: Standard Anticoagulation orders: enoxaparin 40 mg subQ qHS Protocol for LMWH prophylaxis to be resumed Day +1 (next AM)  Will re-time enoxaparin 40 mg subQ daily to resume on 5/9 @ 1000  Pharmacy to sign off.  Lenis Noon, PharmD 07/02/18 4:14 PM

## 2018-07-03 LAB — CBC
HCT: 38.6 % (ref 36.0–46.0)
Hemoglobin: 12.1 g/dL (ref 12.0–15.0)
MCH: 29.3 pg (ref 26.0–34.0)
MCHC: 31.3 g/dL (ref 30.0–36.0)
MCV: 93.5 fL (ref 80.0–100.0)
Platelets: 394 10*3/uL (ref 150–400)
RBC: 4.13 MIL/uL (ref 3.87–5.11)
RDW: 14.6 % (ref 11.5–15.5)
WBC: 12.7 10*3/uL — ABNORMAL HIGH (ref 4.0–10.5)
nRBC: 0 % (ref 0.0–0.2)

## 2018-07-03 MED ORDER — AMOXICILLIN-POT CLAVULANATE 875-125 MG PO TABS: 1.0000 | ORAL_TABLET | Freq: Two times a day (BID) | ORAL | 0 refills | Status: AC

## 2018-07-03 NOTE — Progress Notes (Signed)
Home health agencies that serve [phone]. Your favorite home health agencies  [place] of Patient Care Rating Patient Survey Summary Rating  [place]  [phone] 3  out of 5 stars 4 out of [place]  [phone] 3 out of 5 stars 4 out of [place]  [phone] 4 out of 5 stars 4 out of [place]  [phone] 4 out of 5 stars 4 out of [place]  [phone] 4 out of 5 stars 4 out of [place]  [phone]  out of 5 stars 4 out of [place]  [phone] 4 out of 5 stars 4 out of [place]  [phone] 4 out of 5 stars 4 out of [place]  [phone] 3  out of 5 stars 4 out of [place]  [phone] 3 out of 5 stars 4 out of 5 stars  [facility]  [phone] 4 out of 5 stars Not Available  [place]  [phone] 3 out of 5 stars 4 out of 5 stars  [facility]  [phone] 3  out of 5 stars 3 out of 5 stars  [facility]  [phone] 3  out of 5 stars 4 out of 5 stars  [facility]  [phone] 3  out of 5 stars Not Available  [facility]  [phone] 3  out of 5 stars 4 out of [place]  [phone] 3  out of 5 stars 3 out of [place]  [phone] 3  out of 5 stars Not Available  [place]  [phone] 3 out of 5 stars 3 out of [place]  [phone] 3  out of 5 stars 4 out of [place]  [phone] 4  out of 5 stars 3 out of [place] number Footnote as displayed on [place]  1 This agency provides services under a federal waiver program to non-traditional, chronic long term population.  2 This agency provides services to a special needs population.  3 Not Available.  4 The number of patient episodes for this measure is too small to report.  5 This measure currently does not have data or provider has been certified/recertified for less than 6 months.  6 The national average for this measure is not provided because of state-to-state differences in data collection.  7 Medicare is not displaying rates for this measure for any home health agency, because of an issue with the data.  8 There were problems with the data and they are being corrected.  9 Zero, or very few, patients met the survey's rules for inclusion. The scores shown, if any, reflect a very small number of surveys and may not accurately tell how an agency is doing.  10 Survey results are based on less than  12 months of data.  11 Fewer than 70 patients completed the survey. Use the scores shown, if any, with caution as the number of surveys may be too low to accurately tell how an agency is doing.  12 No survey results are available for this period.  13 Data suppressed by CMS for one or more quarters.

## 2018-07-03 NOTE — Progress Notes (Signed)
9 Days Post-Op   Subjective/Chief Complaint: No complaints. Wants to go home   Objective: Vital signs in last 24 hours: Temp:  [97.7 F (36.5 C)-98.7 F (37.1 C)] 98.7 F (37.1 C) (05/09 0459) Pulse Rate:  [78-103] 78 (05/09 0459) Resp:  [15-20] 20 (05/09 0459) BP: (118-156)/(73-90) 125/75 (05/09 0459) SpO2:  [94 %-100 %] 94 % (05/09 0459) Weight:  [71 kg] 71 kg (05/09 0500) Last BM Date: 07/02/18  Intake/Output from previous day: 05/08 0701 - 05/09 0700 In: 1831.9 [P.O.:360; I.V.:1237.5; IV Piggyback:224.4] Out: 240 [Urine:200; Drains:40] Intake/Output this shift: No intake/output data recorded.  General appearance: alert and cooperative Resp: clear to auscultation bilaterally Cardio: regular rate and rhythm GI: soft, nontender. drain output serosanguinous  Lab Results:  Recent Labs    07/02/18 0444 07/03/18 0448  WBC 10.9* 12.7*  HGB 11.2* 12.1  HCT 34.6* 38.6  PLT 338 394   BMET Recent Labs    07/02/18 0444  NA 137  K 3.0*  CL 102  CO2 28  GLUCOSE 95  BUN 7*  CREATININE 0.62  CALCIUM 8.3*   PT/INR Recent Labs    07/02/18 0444  LABPROT 17.3*  INR 1.4*   ABG No results for input(s): PHART, HCO3 in the last 72 hours.  Invalid input(s): PCO2, PO2  Studies/Results: Ct Abdomen Pelvis W Contrast  Result Date: 07/01/2018 CLINICAL DATA:  Abdominal infection, suspect abdominal abscess, status post appendectomy EXAM: CT ABDOMEN AND PELVIS WITH CONTRAST TECHNIQUE: Multidetector CT imaging of the abdomen and pelvis was performed using the standard protocol following bolus administration of intravenous contrast. CONTRAST:  131mL OMNIPAQUE IOHEXOL 300 MG/ML SOLN, additional oral enteric contrast COMPARISON:  CT abdomen pelvis, 06/23/2018 FINDINGS: Lower chest: No acute abnormality. Bibasilar scarring. Coronary artery calcifications. Hepatobiliary: No focal liver abnormality is seen. Calcified gallstone in the gallbladder fundus. No gallbladder wall thickening,  or biliary dilatation. Pancreas: Unremarkable. No pancreatic ductal dilatation or surrounding inflammatory changes. Spleen: Normal in size without focal abnormality. Adrenals/Urinary Tract: Adrenal glands are unremarkable. Kidneys are normal, without renal calculi, focal lesion, or hydronephrosis. Air within the urinary bladder, likely secondary to catheterization. Stomach/Bowel: Stomach is within normal limits. No evidence of bowel wall thickening, distention, or inflammatory changes. Sigmoid diverticulosis. Vascular/Lymphatic: Mixed calcific atherosclerosis. No enlarged abdominal or pelvic lymph nodes. Reproductive: Calcified fibroids.  Pessary present in vagina. Other: No abdominal wall hernia or abnormality. There is a rim enhancing fluid collection in the low pelvis posterior to the uterus measuring at least 4.6 by 3.7 cm (series 2, image 66). There is additional higher attenuation fluid about the proximal jejunum, adjacent to the course of a surgical drain (series 2, image 35). Musculoskeletal: No acute or significant osseous findings. IMPRESSION: 1. Status post interval appendectomy. There is a rim enhancing fluid collection in the low pelvis posterior to the uterus measuring at least 4.6 by 3.7 cm, this appearance concerning for abscess (series 2, image 66). There is additional higher attenuation fluid about the proximal jejunum, adjacent to the course of a surgical drain, which may represent hematoma or seroma (series 2, image 35). 2.  Other chronic and incidental findings as detailed above. Electronically Signed   By: Eddie Candle M.D.   On: 07/01/2018 15:17   Ct Image Guided Drainage By Percutaneous Catheter  Result Date: 07/02/2018 INDICATION: 85 year old female with peripherally enhancing fluid collection in the pelvic cul-de-sac after laparoscopic appendectomy. She presents for drain placement. EXAM: CT-guided drain placement, pelvis MEDICATIONS: The patient is currently admitted to the hospital  and  receiving intravenous antibiotics. The antibiotics were administered within an appropriate time frame prior to the initiation of the procedure. ANESTHESIA/SEDATION: Fentanyl 100 mcg IV; Versed 2 mg IV Moderate Sedation Time:  13 minutes The patient was continuously monitored during the procedure by the interventional radiology nurse under my direct supervision. COMPLICATIONS: None immediate. PROCEDURE: Informed written consent was obtained from the patient after a thorough discussion of the procedural risks, benefits and alternatives. All questions were addressed. Maximal Sterile Barrier Technique was utilized including caps, mask, sterile gowns, sterile gloves, sterile drape, hand hygiene and skin antiseptic. A timeout was performed prior to the initiation of the procedure. A planning axial CT scan was performed. The fluid collection was successfully identified. A suitable skin entry site was selected and marked. The overlying skin was sterilely prepped and draped in the standard fashion using chlorhexidine skin prep. Local anesthesia was attained by infiltration with 1% lidocaine. A small dermatotomy was made. Under intermittent CT guidance, a an 18 gauge introducer needle was carefully advanced into the fluid collection. A 0.035 wire was then coiled in the fluid collection and the tract dilated to 10 [place]. A Cook 10.2 [place] all-purpose drainage catheter was then advanced over the wire and formed in the fluid collection. Aspiration yields approximately 20 mL of turbid serosanguineous fluid. A sample was sent for culture. The drainage catheter was then secured to the skin with 0 Prolene suture and an adhesive fixation device. Post drain placement CT imaging demonstrates the drainage catheter well positioned in the prior fluid collection. IMPRESSION: Successful placement of a 10 French drainage catheter into the pelvic cul-de-sac fluid collection. Aspiration yields approximately 15 mL of turbid serosanguineous  fluid. Samples were sent for culture. PLAN: Maintain drain to JP bulb suction. Flush once per shift. If drain output is scant for more than 48 hours and patient is clinically improved, the drainage catheter can be removed. No drain injection or additional imaging required. Electronically Signed   By: Jacqulynn Cadet M.D.   On: 07/02/2018 16:40    Anti-infectives: Anti-infectives (From admission, onward)   Start     Dose/Rate Route Frequency Ordered Stop   07/01/18 1700  meropenem (MERREM) 1 g in sodium chloride 0.9 % 100 mL IVPB     1 g 200 mL/hr over 30 Minutes Intravenous Every 8 hours 07/01/18 1533     06/30/18 1200  Ampicillin-Sulbactam (UNASYN) 3 g in sodium chloride 0.9 % 100 mL IVPB  Status:  Discontinued     3 g 200 mL/hr over 30 Minutes Intravenous Every 6 hours 06/30/18 1042 07/01/18 1531   06/26/18 1000  cefTRIAXone (ROCEPHIN) 2 g in sodium chloride 0.9 % 100 mL IVPB  Status:  Discontinued     2 g 200 mL/hr over 30 Minutes Intravenous Every 24 hours 06/26/18 0924 06/30/18 1042   06/26/18 1000  metroNIDAZOLE (FLAGYL) IVPB 500 mg  Status:  Discontinued     500 mg 100 mL/hr over 60 Minutes Intravenous Every 8 hours 06/26/18 0924 06/30/18 1042   06/24/18 1800  piperacillin-tazobactam (ZOSYN) IVPB 3.375 g  Status:  Discontinued     3.375 g 12.5 mL/hr over 240 Minutes Intravenous Every 8 hours 06/24/18 1603 06/26/18 0924   06/24/18 1115  cefoTEtan (CEFOTAN) 2 g in sodium chloride 0.9 % 100 mL IVPB     2 g 200 mL/hr over 30 Minutes Intravenous On call to O.R. 06/24/18 1102 06/24/18 1237   06/24/18 0200  piperacillin-tazobactam (ZOSYN) IVPB 3.375 g  Status:  Discontinued     3.375 g 12.5 mL/hr over 240 Minutes Intravenous Every 8 hours 06/24/18 0110 06/24/18 1603   06/23/18 1730  piperacillin-tazobactam (ZOSYN) IVPB 3.375 g     3.375 g 100 mL/hr over 30 Minutes Intravenous  Once 06/23/18 1715 06/24/18 1133      Assessment/Plan: s/p Procedure(s): ACUTE GANGRENOUS APPENDICITIS  WITH ABSCESS, PARTIAL SMALL BOWEL OBSTRUCTION, INCISIONAL HERNIA REPAIR, LYSIS OF ADHESIONS (N/A) Advance diet  Continue augmentin at d/c If home health is set up then probably ok for d/c today  LOS: 10 days    Latoya Curry 07/03/2018

## 2018-07-03 NOTE — Discharge Summary (Signed)
Physician Discharge Summary  Latoya Curry OIZ:[id number] DOB: May 22, 1930 DOA: 06/23/2018  PCP: Josetta Huddle, MD  Admit date: 06/23/2018 Discharge date: 07/03/2018  Admitted From: home Disposition:   [place]  Recommendations for Outpatient Follow-up:  1. Follow up with PCP in 1-2 weeks 2. Please obtain BMP/CBC in one week 3. Please follow up on the following pending results:  Home Health:[facility]  Equipment/Devices:None.  Discharge Condition:Stable  CODE STATUS:Full  Diet recommendation:  Regular  Brief/Interim Summary: Brief Narrative: 85 year old female with hypertension, hyperlipidemia, ascending thoracic aortic aneurysm, hypothyroidism status post RAI therapy with anxiety presented to [facility] with 2 days of lower abdominal pain nausea, intermittent fevers on 06/24/18.  Patient had tele  visit by her PCP who recommended evaluation to the ER due to prior history of diverticulitis.  Patient was admitted for acute appendicitis status post surgery 06/24/2018. Pt was waiting for return of bowel function with her staff ileus.  Patient is not tolerating diet.  She has leukocytosis worsening 5/6 and CT abd showed pelvic abscess. Drain was placed by IR with 15 mL of turbid serosanguineous fluid sent for culture.  Patient is eating well having bowel movements afebrile.  Seen by general surgery and will be discharged home.  She will follow-up with IR and surgery and home health will be set up regarding drain management.  Issues addressed Asssesment/plan  Acute gangrenous appendicitis s/p lap appendectomy 06/24/2018 with primary incisional hernia repair: Clinically stable, tolerating diet.  JP drain in place.  Appreciate surgery input on board. Ct showed intra-abdominal abscess and drain placed 5/8. she is clinically stable. On d/c change to augmentin For 10-14 day course.  Intra-abdominal abscess: S/P IR guided drain placement W MINIMAL FLUID ASPOIRATED.  Patient is clinically stable,  afebrile tolerating diet with bowel movements.  Seen by general surgery okay to discharge if home if home health is set up and [facility] has been consulted for the same. Will discharge on augmentin. As per IR " Expect drain out-put to be very low, can likely remove drain when out put is scant for 48 hours"   Bacteremia with gram-positive rods- Eubacterium Limosum in anaerobic bottle: This is likely secondary to #1, discussed with the pharmacist antibiotic changed to Unasyn IV 5/6  then meropenem 5/7 due to abscess, now drain in place and will convert to p.o. Augmentin on discharge to complete the course for about 8 more days. Repeat blood culture 06/29/18 is NGTD. F/u culture from drain- by surgery. Recent Labs  Lab 06/29/18 0307 06/30/18 0349 07/01/18 0351 07/02/18 0444 07/03/18 0448  WBC 10.4 12.1* 15.3* 10.9* 12.7*   Asymptomatic bacteriuria, urine culture with E. coli.  Patient off antibiotics  Postop ileus: resolved, on po and tolerating well  Essential hypertension: BP controlled on home amlodipine, atenolol and HCTZ  Hypokalemia/hypomagnesemia:replaced  Ascending thoracic aortic aneurysm followed by cardiothoracic surgery Dr. Darcey Nora stable 5.4 cm continue medical therapy with blood pressure optimization.  Hyperthyroidism with history of RAI therapy in January 2020 followed by PCP.  HLD- cont her statin  Anxiety/depression mood is stable continue Celexa.  COVID 19 is negative  DVT prophylaxis: scd/lovenox. Code Status: Full  Discharge Diagnoses:  Principal Problem:   Acute gangrenous appendicitis s/p lap appendectomy 06/24/2018 Active Problems:   Essential hypertension   Hypokalemia   Small bowel obstruction, partial, s/p lap adhesiolysis 06/24/2018   Incarcerated incisional hernia s/p primary repair 06/24/2018    Discharge Instructions  Discharge Instructions    Call MD for:  extreme fatigue  Complete by:  As directed    Call MD for:  persistant  dizziness or light-headedness   Complete by:  As directed    Call MD for:  persistant nausea and vomiting   Complete by:  As directed    Call MD for:  redness, tenderness, or signs of infection (pain, swelling, redness, odor or green/yellow discharge around incision site)   Complete by:  As directed    Call MD for:  severe uncontrolled pain   Complete by:  As directed    Call MD for:  temperature >100.4   Complete by:  As directed    Diet - low sodium heart healthy   Complete by:  As directed    Increase activity slowly   Complete by:  As directed      Allergies as of 07/03/2018      Reactions   Codeine Nausea And Vomiting   Bactrim [sulfamethoxazole-trimethoprim] Other (See Comments)   unknown   Zestril [lisinopril] Cough   Zocor [simvastatin] Other (See Comments)   unknown      Medication List    TAKE these medications   amLODipine 2.5 MG tablet Commonly known as:  NORVASC Take 2.5 mg by mouth daily.   amoxicillin-clavulanate 875-125 MG tablet Commonly known as:  Augmentin Take 1 tablet by mouth 2 (two) times daily for 8 days.   aspirin 81 MG tablet Take 81 mg by mouth See admin instructions. Monday, Wednesday and Friday   atenolol-chlorthalidone 50-25 MG tablet Commonly known as:  TENORETIC Take 1 tablet by mouth daily.   cetirizine 10 MG tablet Commonly known as:  ZYRTEC Take 10 mg by mouth daily.   cholecalciferol 1000 units tablet Commonly known as:  VITAMIN D Take 1,000 Units by mouth daily.   citalopram 20 MG tablet Commonly known as:  CELEXA Take 20 mg by mouth daily.   omeprazole 20 MG capsule Commonly known as:  PRILOSEC Take 30- 60 min before your first and last meals of the day   potassium chloride 10 MEQ tablet Commonly known as:  K-DUR Take 10 mEq by mouth daily.   rosuvastatin 10 MG tablet Commonly known as:  CRESTOR Take 5 mg by mouth See admin instructions. Monday, Wednesday and Friday   Toviaz 8 MG Tb24 tablet Generic drug:   fesoterodine Take 8 mg by mouth daily.      Follow-up Information    Michael Boston, MD Follow up on 07/22/2018.   Specialty:  General Surgery Why:  at 10:00am. Be a the office 20 minutes early for check in.  Bring photo ID and insurance information with you.   Contact information: [address] [place] [address] [phone]        Josetta Huddle, MD Follow up in 1 week(s).   Specialty:  Internal Medicine Contact information: [address] [place] [address] [phone]        [facility], [facility] Follow up.   Specialty:  Home Health Services Why:  Home Health Physical Therapy, RN, Occupational Therapy, aide and Education officer, museum. agency will call to arrange visit Contact information: [address] [place] [address] [phone]          Allergies  Allergen Reactions  . Codeine Nausea And Vomiting  . Bactrim [Sulfamethoxazole-Trimethoprim] Other (See Comments)    unknown  . Zestril [Lisinopril] Cough  . Zocor [Simvastatin] Other (See Comments)    unknown    Consultations:  surgery  IR  Procedures/Studies: Ct Abdomen Pelvis W Contrast  Result Date: 07/01/2018 CLINICAL DATA:  Abdominal infection, suspect abdominal abscess, status post appendectomy EXAM: CT ABDOMEN AND PELVIS WITH CONTRAST TECHNIQUE: Multidetector CT imaging of the abdomen and pelvis was performed using the standard protocol following bolus administration of intravenous contrast. CONTRAST:  111mL OMNIPAQUE IOHEXOL 300 MG/ML SOLN, additional oral enteric contrast COMPARISON:  CT abdomen pelvis, 06/23/2018 FINDINGS: Lower chest: No acute abnormality. Bibasilar scarring. Coronary artery calcifications. Hepatobiliary: No focal liver abnormality is seen. Calcified gallstone in the gallbladder fundus. No gallbladder wall thickening, or biliary dilatation. Pancreas: Unremarkable. No pancreatic ductal dilatation or surrounding inflammatory changes.  Spleen: Normal in size without focal abnormality. Adrenals/Urinary Tract: Adrenal glands are unremarkable. Kidneys are normal, without renal calculi, focal lesion, or hydronephrosis. Air within the urinary bladder, likely secondary to catheterization. Stomach/Bowel: Stomach is within normal limits. No evidence of bowel wall thickening, distention, or inflammatory changes. Sigmoid diverticulosis. Vascular/Lymphatic: Mixed calcific atherosclerosis. No enlarged abdominal or pelvic lymph nodes. Reproductive: Calcified fibroids.  Pessary present in vagina. Other: No abdominal wall hernia or abnormality. There is a rim enhancing fluid collection in the low pelvis posterior to the uterus measuring at least 4.6 by 3.7 cm (series 2, image 66). There is additional higher attenuation fluid about the proximal jejunum, adjacent to the course of a surgical drain (series 2, image 35). Musculoskeletal: No acute or significant osseous findings. IMPRESSION: 1. Status post interval appendectomy. There is a rim enhancing fluid collection in the low pelvis posterior to the uterus measuring at least 4.6 by 3.7 cm, this appearance concerning for abscess (series 2, image 66). There is additional higher attenuation fluid about the proximal jejunum, adjacent to the course of a surgical drain, which may represent hematoma or seroma (series 2, image 35). 2.  Other chronic and incidental findings as detailed above. Electronically Signed   By: Eddie Candle M.D.   On: 07/01/2018 15:17   Ct Abdomen Pelvis W Contrast  Result Date: 06/23/2018 CLINICAL DATA:  85 year old female with lower abdominal pain EXAM: CT ABDOMEN AND PELVIS WITH CONTRAST TECHNIQUE: Multidetector CT imaging of the abdomen and pelvis was performed using the standard protocol following bolus administration of intravenous contrast. CONTRAST:  162mL OMNIPAQUE IOHEXOL 300 MG/ML  SOLN COMPARISON:  None. FINDINGS: Lower chest: No acute finding of the lower chest.  Atelectasis/scarring. Hepatobiliary: Unremarkable liver. Cholelithiasis. No associated inflammatory changes. Pancreas: Punctate calcifications of pancreas with no inflammatory changes. Spleen: Unremarkable Adrenals/Urinary Tract: Unremarkable adrenal glands. No hydronephrosis or nephrolithiasis. Unremarkable course the bilateral ureters. Unremarkable urinary bladder. Stomach/Bowel: Unremarkable stomach. Small bowel decompressed without abnormal distention. No focal wall thickening of small bowel. Inflammatory changes of the appendix which is dilated, fluid-filled, with fecalith at the base. No evidence of perforation or abscess. Extensive diverticular disease without evidence of acute diverticulitis. Vascular/Lymphatic: Atherosclerosis.  No adenopathy. Reproductive: Fibroid uterus with internal calcifications, otherwise unremarkable. Pessary in place. Other: Reactive fluid within the dependent pelvis.  No hernia. Musculoskeletal: Compression fracture of T12 is unchanged from previous CT. No acute displaced fracture. Multilevel degenerative changes. IMPRESSION: Acute uncomplicated appendicitis. These results were discussed by telephone at the time of interpretation on 06/23/2018 at 8:55 pm with Dr. Gareth Morgan. Reactive free fluid within the dependent pelvis. Diverticular disease without evidence of acute diverticulitis. Cholelithiasis. Aortic Atherosclerosis (ICD10-I70.0). Ancillary findings as above. Electronically Signed   By: Corrie Mckusick D.O.   On: 06/23/2018 20:56   Dg Chest Portable 1 View  Result Date: 06/23/2018 CLINICAL DATA:  Fever. Hypoxia  EXAM: PORTABLE CHEST 1 VIEW COMPARISON:  06/12/2016 FINDINGS: Stable mild cardiomegaly. Aortic atherosclerosis. Both lungs are clear. IMPRESSION: Stable mild cardiomegaly. No active lung disease. Electronically Signed   By: Earle Gell M.D.   On: 06/23/2018 18:24   Ct Image Guided Drainage By Percutaneous Catheter  Result Date: 07/02/2018 INDICATION:  85 year old female with peripherally enhancing fluid collection in the pelvic cul-de-sac after laparoscopic appendectomy. She presents for drain placement. EXAM: CT-guided drain placement, pelvis MEDICATIONS: The patient is currently admitted to the hospital and receiving intravenous antibiotics. The antibiotics were administered within an appropriate time frame prior to the initiation of the procedure. ANESTHESIA/SEDATION: Fentanyl 100 mcg IV; Versed 2 mg IV Moderate Sedation Time:  13 minutes The patient was continuously monitored during the procedure by the interventional radiology nurse under my direct supervision. COMPLICATIONS: None immediate. PROCEDURE: Informed written consent was obtained from the patient after a thorough discussion of the procedural risks, benefits and alternatives. All questions were addressed. Maximal Sterile Barrier Technique was utilized including caps, mask, sterile gowns, sterile gloves, sterile drape, hand hygiene and skin antiseptic. A timeout was performed prior to the initiation of the procedure. A planning axial CT scan was performed. The fluid collection was successfully identified. A suitable skin entry site was selected and marked. The overlying skin was sterilely prepped and draped in the standard fashion using chlorhexidine skin prep. Local anesthesia was attained by infiltration with 1% lidocaine. A small dermatotomy was made. Under intermittent CT guidance, a an 18 gauge introducer needle was carefully advanced into the fluid collection. A 0.035 wire was then coiled in the fluid collection and the tract dilated to 10 [place]. A Cook 10.2 [place] all-purpose drainage catheter was then advanced over the wire and formed in the fluid collection. Aspiration yields approximately 20 mL of turbid serosanguineous fluid. A sample was sent for culture. The drainage catheter was then secured to the skin with 0 Prolene suture and an adhesive fixation device. Post drain placement CT  imaging demonstrates the drainage catheter well positioned in the prior fluid collection. IMPRESSION: Successful placement of a 10 French drainage catheter into the pelvic cul-de-sac fluid collection. Aspiration yields approximately 15 mL of turbid serosanguineous fluid. Samples were sent for culture. PLAN: Maintain drain to JP bulb suction. Flush once per shift. If drain output is scant for more than 48 hours and patient is clinically improved, the drainage catheter can be removed. No drain injection or additional imaging required. Electronically Signed   By: Jacqulynn Cadet M.D.   On: 07/02/2018 16:40    Subjective: resting well. Wants to go home today and is adamant. No fever, chills, abdominal pain. tolerating diet well. Discharge Exam: Vitals:   07/03/18 1030 07/03/18 1313  BP: 130/71 135/83  Pulse: 88 81  Resp:  18  Temp: 99.3 F (37.4 C) 97.7 F (36.5 C)  SpO2: 96% 98%   Vitals:   07/03/18 0459 07/03/18 0500 07/03/18 1030 07/03/18 1313  BP: 125/75  130/71 135/83  Pulse: 78  88 81  Resp: 20   18  Temp: 98.7 F (37.1 C)  99.3 F (37.4 C) 97.7 F (36.5 C)  TempSrc: Oral  Oral Oral  SpO2: 94%  96% 98%  Weight:  71 kg    Height:        General: Pt is alert, awake, not in acute distress. Elderly,frail. Cardiovascular: RRR, S1/S2 +, no rubs, no gallops Respiratory: CTA bilaterally, no wheezing, no rhonchi Abdominal: Soft, NT, ND, bowel sounds +l. Drain  in place in abdomen w minimal drain. Extremities: no edema, no cyanosis   The results of significant diagnostics from this hospitalization (including imaging, microbiology, ancillary and laboratory) are listed below for reference.     Microbiology: Recent Results (from the past 240 hour(s))  Urine culture     Status: Abnormal   Collection Time: 06/23/18  5:19 PM  Result Value Ref Range Status   Specimen Description   Final    URINE, RANDOM Performed at [facility], [address] [place], [place]  [address]    Special Requests   Final    NONE Performed at [facility], [address] [place], [place] [address]    Culture >=100,000 COLONIES/mL ESCHERICHIA COLI (A)  Final   Report Status 06/26/2018 FINAL  Final   Organism ID, Bacteria ESCHERICHIA COLI (A)  Final      Susceptibility   Escherichia coli - MIC*    AMPICILLIN >=32 RESISTANT Resistant     CEFAZOLIN 32 INTERMEDIATE Intermediate     CEFTRIAXONE <=1 SENSITIVE Sensitive     CIPROFLOXACIN >=4 RESISTANT Resistant     GENTAMICIN <=1 SENSITIVE Sensitive     IMIPENEM <=0.25 SENSITIVE Sensitive     NITROFURANTOIN <=16 SENSITIVE Sensitive     TRIMETH/SULFA <=20 SENSITIVE Sensitive     AMPICILLIN/SULBACTAM >=32 RESISTANT Resistant     PIP/TAZO >=128 RESISTANT Resistant     Extended ESBL NEGATIVE Sensitive     * >=100,000 COLONIES/mL ESCHERICHIA COLI  Blood culture (routine x 2)     Status: Abnormal   Collection Time: 06/23/18  5:20 PM  Result Value Ref Range Status   Specimen Description   Final    BLOOD BLOOD LEFT FOREARM Performed at [facility], [address] [place], [place] [address]    Special Requests   Final    BOTTLES DRAWN AEROBIC AND ANAEROBIC Blood Culture adequate volume Performed at [facility], [address] [place], [place] [address]    Culture  Setup Time   Final    GRAM POSITIVE RODS ANAEROBIC BOTTLE ONLY CRITICAL RESULT CALLED TO, READ BACK BY AND VERIFIED WITHKeturah Barre [facility] PHARMD 1935 06/25/18 A BROWNING Performed at [facility], [address] [place], [place] [address]    Culture EUBACTERIUM LIMOSUM (A)  Final   Report Status 06/30/2018 FINAL  Final  Blood culture (routine x 2)     Status: Abnormal   Collection Time: 06/23/18  6:10 PM  Result Value Ref Range Status   Specimen Description   Final    BLOOD BLOOD RIGHT ARM Performed at [facility], [address] [place], [place] [address]    Special Requests   Final    BOTTLES DRAWN AEROBIC  AND ANAEROBIC Blood Culture adequate volume Performed at [facility], [address] [place], [place] [address]    Culture  Setup Time   Final    GRAM POSITIVE RODS ANAEROBIC BOTTLE ONLY CRITICAL VALUE NOTED.  VALUE IS CONSISTENT WITH PREVIOUSLY REPORTED AND CALLED VALUE. Performed at [facility], [place] [address], [place],  [address]    Culture EUBACTERIUM LIMOSUM (A)  Final   Report Status 06/30/2018 FINAL  Final  SARS Coronavirus 2 [facility] order, Performed in [facility] hospital lab)     Status: None   Collection Time: 06/23/18  7:14 PM  Result Value Ref Range Status   SARS Coronavirus 2  NEGATIVE NEGATIVE Final    Comment: (NOTE) If result is NEGATIVE SARS-CoV-2 target nucleic acids are NOT DETECTED. The SARS-CoV-2 RNA is generally detectable in upper and lower  respiratory specimens during the acute phase of infection. The lowest  concentration of SARS-CoV-2 viral copies this assay can detect is 250  copies / mL. A negative result does not preclude SARS-CoV-2 infection  and should not be used as the sole basis for treatment or other  patient management decisions.  A negative result may occur with  improper specimen collection / handling, submission of specimen other  than nasopharyngeal swab, presence of viral mutation(s) within the  areas targeted by this assay, and inadequate number of viral copies  (<250 copies / mL). A negative result must be combined with clinical  observations, patient history, and epidemiological information. If result is POSITIVE SARS-CoV-2 target nucleic acids are DETECTED. The SARS-CoV-2 RNA is generally detectable in upper and lower  respiratory specimens dur ing the acute phase of infection.  Positive  results are indicative of active infection with SARS-CoV-2.  Clinical  correlation with patient history and other diagnostic information is  necessary to determine patient infection status.  Positive results do  not rule  out bacterial infection or co-infection with other viruses. If result is PRESUMPTIVE POSTIVE SARS-CoV-2 nucleic acids MAY BE PRESENT.   A presumptive positive result was obtained on the submitted specimen  and confirmed on repeat testing.  While 2019 novel coronavirus  (SARS-CoV-2) nucleic acids may be present in the submitted sample  additional confirmatory testing may be necessary for epidemiological  and / or clinical management purposes  to differentiate between  SARS-CoV-2 and other Sarbecovirus currently known to infect humans.  If clinically indicated additional testing with an alternate test  methodology [phone]) is advised. The SARS-CoV-2 RNA is generally  detectable in upper and lower respiratory sp ecimens during the acute  phase of infection. The expected result is Negative. Fact Sheet for Patients:  StrictlyIdeas.no Fact Sheet for Healthcare Providers: BankingDealers.co.za This test is not yet approved or cleared by the [place] FDA and has been authorized for detection and/or diagnosis of SARS-CoV-2 by FDA under an Emergency Use Authorization (EUA).  This EUA will remain in effect (meaning this test can be used) for the duration of the COVID-19 declaration under Section 564(b)(1) of the Act, 21 U.S.C. section 360bbb-3(b)(1), unless the authorization is terminated or revoked sooner. Performed at [facility], [place] [address], [place], [place] [address]   MRSA PCR Screening     Status: None   Collection Time: 06/24/18  2:11 AM  Result Value Ref Range Status   MRSA by PCR NEGATIVE NEGATIVE Final    Comment:        The GeneXpert MRSA Assay (FDA approved for NASAL specimens only), is one component of a comprehensive MRSA colonization surveillance program. It is not intended to diagnose MRSA infection nor to guide or monitor treatment for MRSA infections. Performed at [facility], [place]  [address] [place], [place] [address]   Culture, blood (routine x 2)     Status: None (Preliminary result)   Collection Time: 06/29/18 11:51 AM  Result Value Ref Range Status   Specimen Description   Final    BLOOD RIGHT ARM Performed at [place] [address] [place], [place] [address]    Special Requests   Final    BOTTLES DRAWN AEROBIC AND ANAEROBIC Blood Culture adequate volume Performed at [facility], [place] [address] [place] [address], [place], [place] [address]    Culture   Final    NO GROWTH 3 DAYS Performed at [facility], [place] [address] [place], [place] [address]    Report Status PENDING  Incomplete  Culture, blood (routine x 2)     Status: None (Preliminary result)   Collection Time: 06/29/18 11:51 AM  Result Value Ref Range Status   Specimen Description   Final    BLOOD RIGHT HAND Performed at [facility] [address], [place], [place] [address]    Special Requests   Final    BOTTLES DRAWN AEROBIC ONLY Blood Culture results may not be optimal due to an inadequate volume of blood received in culture bottles Performed at [place] [address], [place], [place] [address]    Culture   Final    NO GROWTH 3 DAYS Performed at [facility], [place] [address], [place], [place] [address]    Report Status PENDING  Incomplete  Aerobic/Anaerobic Culture (surgical/deep wound)     Status: None (Preliminary result)   Collection Time: 07/02/18  3:38 PM  Result Value Ref Range Status   Specimen Description   Final    ABSCESS Performed at [place] [address] [place], [place] [address]    Special Requests   Final    Normal Performed at [facility], [place] [address], [place], [place] [address]    Gram Stain   Final    ABUNDANT WBC PRESENT,BOTH PMN AND MONONUCLEAR RARE GRAM VARIABLE ROD    Culture   Final    NO GROWTH < 24 HOURS Performed at  [facility], [place] [address] [place], [place] [address]    Report Status PENDING  Incomplete     Labs: BNP (last 3 results) No results for input(s): BNP in the last 8760 hours. Basic Metabolic Panel: Recent Labs  Lab 06/27/18 0420 06/28/18 0355 06/29/18 0307 06/30/18 0349 07/02/18 0444  NA 137 136 138 138 137  K 3.0* 3.0* 3.6 3.8 3.0*  CL 98 100 105 104 102  CO2 27 29 24 26 28   GLUCOSE 67* 109* 116* 99 95  BUN 18 10 6* 6* 7*  CREATININE 0.71 0.65 0.70 0.66 0.62  CALCIUM 8.6* 8.4* 8.5* 8.6* 8.3*  MG 1.7 1.8 1.6*  --   --    Liver Function Tests: Recent Labs  Lab 07/02/18 0444  AST 20  ALT 16  ALKPHOS 42  BILITOT 0.6  PROT 5.5*  ALBUMIN 2.7*   No results for input(s): LIPASE, AMYLASE in the last 168 hours. No results for input(s): AMMONIA in the last 168 hours. CBC: Recent Labs  Lab 06/29/18 0307 06/30/18 0349 07/01/18 0351 07/02/18 0444 07/03/18 0448  WBC 10.4 12.1* 15.3* 10.9* 12.7*  HGB 11.7* 12.3 12.3 11.2* 12.1  HCT 37.4 39.7 39.0 34.6* 38.6  MCV 92.6 93.6 93.3 92.8 93.5  PLT 301 360 389 338 394   Cardiac Enzymes: No results for input(s): CKTOTAL, CKMB, CKMBINDEX, TROPONINI in the last 168 hours. BNP: Invalid input(s): POCBNP CBG: No results for input(s): GLUCAP in the last 168 hours. D-Dimer No results for input(s): DDIMER in the last 72 hours. Hgb A1c No results for input(s): HGBA1C in the last 72 hours. Lipid Profile No results for input(s): CHOL, HDL, LDLCALC, TRIG, CHOLHDL, LDLDIRECT in the last 72 hours. Thyroid function studies No results for input(s): TSH, T4TOTAL, T3FREE, THYROIDAB  in the last 72 hours.  Invalid input(s): FREET3 Anemia work up No results for input(s): VITAMINB12, FOLATE, FERRITIN, TIBC, IRON, RETICCTPCT in the last 72 hours. Urinalysis    Component Value Date/Time   COLORURINE YELLOW 06/23/2018 1719   APPEARANCEUR CLOUDY (A) 06/23/2018 1719   LABSPEC >1.030 (H) 06/23/2018 1719   PHURINE 5.5 06/23/2018  1719   GLUCOSEU NEGATIVE 06/23/2018 1719   HGBUR SMALL (A) 06/23/2018 1719   BILIRUBINUR NEGATIVE 06/23/2018 1719   KETONESUR NEGATIVE 06/23/2018 1719   PROTEINUR NEGATIVE 06/23/2018 1719   UROBILINOGEN 0.2 01/08/2007 2322   NITRITE POSITIVE (A) 06/23/2018 1719   LEUKOCYTESUR MODERATE (A) 06/23/2018 1719   Sepsis Labs Invalid input(s): PROCALCITONIN,  WBC,  LACTICIDVEN Microbiology Recent Results (from the past 240 hour(s))  Urine culture     Status: Abnormal   Collection Time: 06/23/18  5:19 PM  Result Value Ref Range Status   Specimen Description   Final    URINE, RANDOM Performed at [facility], [address] [place], [place] [address]    Special Requests   Final    NONE Performed at [facility], [address] [place], [place] [address]    Culture >=100,000 COLONIES/mL ESCHERICHIA COLI (A)  Final   Report Status 06/26/2018 FINAL  Final   Organism ID, Bacteria ESCHERICHIA COLI (A)  Final      Susceptibility   Escherichia coli - MIC*    AMPICILLIN >=32 RESISTANT Resistant     CEFAZOLIN 32 INTERMEDIATE Intermediate     CEFTRIAXONE <=1 SENSITIVE Sensitive     CIPROFLOXACIN >=4 RESISTANT Resistant     GENTAMICIN <=1 SENSITIVE Sensitive     IMIPENEM <=0.25 SENSITIVE Sensitive     NITROFURANTOIN <=16 SENSITIVE Sensitive     TRIMETH/SULFA <=20 SENSITIVE Sensitive     AMPICILLIN/SULBACTAM >=32 RESISTANT Resistant     PIP/TAZO >=128 RESISTANT Resistant     Extended ESBL NEGATIVE Sensitive     * >=100,000 COLONIES/mL ESCHERICHIA COLI  Blood culture (routine x 2)     Status: Abnormal   Collection Time: 06/23/18  5:20 PM  Result Value Ref Range Status   Specimen Description   Final    BLOOD BLOOD LEFT FOREARM Performed at [facility], [address] [place], [place] [address]    Special Requests   Final    BOTTLES DRAWN AEROBIC AND ANAEROBIC Blood Culture adequate volume Performed at [facility], [address] [place], [place] [address]    Culture  Setup Time   Final    GRAM POSITIVE RODS ANAEROBIC BOTTLE ONLY CRITICAL RESULT CALLED TO, READ BACK BY AND VERIFIED WITHKeturah Barre [facility] PHARMD 1935 06/25/18 A BROWNING Performed at [facility], [address] [place], [place] [address]    Culture EUBACTERIUM LIMOSUM (A)  Final   Report Status 06/30/2018 FINAL  Final  Blood culture (routine x 2)     Status: Abnormal   Collection Time: 06/23/18  6:10 PM  Result Value Ref Range Status   Specimen Description   Final    BLOOD BLOOD RIGHT ARM Performed at [facility], [address] [place], [place] [address]    Special Requests   Final    BOTTLES DRAWN AEROBIC AND ANAEROBIC Blood Culture adequate volume Performed at [facility], [address] [place],  [address]    Culture  Setup Time   Final    Lonell Grandchild  POSITIVE RODS ANAEROBIC BOTTLE ONLY CRITICAL VALUE NOTED.  VALUE IS CONSISTENT WITH PREVIOUSLY REPORTED AND CALLED VALUE. Performed at [facility], [place] [address], [place], [place] [address]    Culture EUBACTERIUM LIMOSUM (A)  Final   Report Status 06/30/2018 FINAL  Final  SARS Coronavirus 2 [facility] order, Performed in [facility] hospital lab)     Status: None   Collection Time: 06/23/18  7:14 PM  Result Value Ref Range Status   SARS Coronavirus 2 NEGATIVE NEGATIVE Final    Comment: (NOTE) If result is NEGATIVE SARS-CoV-2 target nucleic acids are NOT DETECTED. The SARS-CoV-2 RNA is generally detectable in upper and lower  respiratory specimens during the acute phase of infection. The lowest  concentration of SARS-CoV-2 viral copies this assay can detect is 250  copies / mL. A negative result does not preclude SARS-CoV-2 infection  and should not be used as the sole basis for treatment or other  patient management decisions.  A negative result may occur with  improper specimen collection / handling, submission of specimen other  than nasopharyngeal swab,  presence of viral mutation(s) within the  areas targeted by this assay, and inadequate number of viral copies  (<250 copies / mL). A negative result must be combined with clinical  observations, patient history, and epidemiological information. If result is POSITIVE SARS-CoV-2 target nucleic acids are DETECTED. The SARS-CoV-2 RNA is generally detectable in upper and lower  respiratory specimens dur ing the acute phase of infection.  Positive  results are indicative of active infection with SARS-CoV-2.  Clinical  correlation with patient history and other diagnostic information is  necessary to determine patient infection status.  Positive results do  not rule out bacterial infection or co-infection with other viruses. If result is PRESUMPTIVE POSTIVE SARS-CoV-2 nucleic acids MAY BE PRESENT.   A presumptive positive result was obtained on the submitted specimen  and confirmed on repeat testing.  While 2019 novel coronavirus  (SARS-CoV-2) nucleic acids may be present in the submitted sample  additional confirmatory testing may be necessary for epidemiological  and / or clinical management purposes  to differentiate between  SARS-CoV-2 and other Sarbecovirus currently known to infect humans.  If clinically indicated additional testing with an alternate test  methodology [phone]) is advised. The SARS-CoV-2 RNA is generally  detectable in upper and lower respiratory sp ecimens during the acute  phase of infection. The expected result is Negative. Fact Sheet for Patients:  StrictlyIdeas.no Fact Sheet for Healthcare Providers: BankingDealers.co.za This test is not yet approved or cleared by the [place] FDA and has been authorized for detection and/or diagnosis of SARS-CoV-2 by FDA under an Emergency Use Authorization (EUA).  This EUA will remain in effect (meaning this test can be used) for the duration of the COVID-19 declaration under  Section 564(b)(1) of the Act, 21 U.S.C. section 360bbb-3(b)(1), unless the authorization is terminated or revoked sooner. Performed at [facility], [place] [address], [place], [place] [address]   MRSA PCR Screening     Status: None   Collection Time: 06/24/18  2:11 AM  Result Value Ref Range Status   MRSA by PCR NEGATIVE NEGATIVE Final    Comment:        The GeneXpert MRSA Assay (FDA approved for NASAL specimens only), is one component of a comprehensive MRSA colonization surveillance program. It is not intended to diagnose MRSA infection nor to guide or monitor treatment for MRSA infections. Performed at [facility],  [place] [address], [place], [place] [address]   Culture, blood (routine x 2)     Status: None (Preliminary result)   Collection Time: 06/29/18 11:51 AM  Result Value Ref Range Status   Specimen Description   Final    BLOOD RIGHT ARM Performed at [place] [address], [place], [place] [address]    Special Requests   Final    BOTTLES DRAWN AEROBIC AND ANAEROBIC Blood Culture adequate volume Performed at [place] [address], [place], [place] [address]    Culture   Final    NO GROWTH 3 DAYS Performed at [facility], [place] [address], [place], [place] [address]    Report Status PENDING  Incomplete  Culture, blood (routine x 2)     Status: None (Preliminary result)   Collection Time: 06/29/18 11:51 AM  Result Value Ref Range Status   Specimen Description   Final    BLOOD RIGHT HAND Performed at [place] [address], [place], [place] [address]    Special Requests   Final    BOTTLES DRAWN AEROBIC ONLY Blood Culture results may not be optimal due to an inadequate volume of blood received in culture bottles Performed at [place] [address] [place], [place] [address]    Culture   Final    NO GROWTH 3 DAYS Performed at [facility], [place] [address] [place], [place] [address]    Report Status PENDING  Incomplete  Aerobic/Anaerobic Culture (surgical/deep wound)     Status: None (Preliminary result)   Collection Time: 07/02/18  3:38 PM  Result Value Ref Range Status   Specimen Description   Final    ABSCESS Performed at [place] [address], [place], [place] [address]    Special Requests   Final    Normal Performed at [facility], [place] [address] [place], [place] [address]    Gram Stain   Final    ABUNDANT WBC PRESENT,BOTH PMN AND MONONUCLEAR RARE GRAM VARIABLE ROD    Culture   Final    NO GROWTH < 24 HOURS Performed at [facility], [place] [address], [place], [place] [address]    Report Status PENDING  Incomplete     Time coordinating discharge: 35 minutes  SIGNED:   Antonieta Pert, MD  [facility] 07/03/2018, 1:35 PM  If 7PM-7AM, please contact night-coverage [email or web address]

## 2018-07-03 NOTE — TOC Initial Note (Signed)
Transition of Care [facility]) - Initial/Assessment Note    Patient Details  Name: Latoya Curry MRN: [id number] Date of Birth: 12-Mar-1930  [facility] CM/SW Contact:    Erenest Rasher, RN Phone Number: 07/03/2018, 1:25 PM  Clinical Narrative:                 Spoke to pt and offered choice for [facility]. Pt states she used [facility] in the past. [employer] with new referral. Pt has a cane at home. She will dc to her dtr's home, Dtrs address [address] [place], [place] [address]. [place] apt, but dtr does not want her to be alone at this time.     Expected Discharge Plan: [place] Barriers to Discharge: No Barriers Identified   Patient Goals and CMS Choice Patient states their goals for this hospitalization and ongoing recovery are:: want to get stronger CMS Medicare.gov Compare Post Acute Care list provided to:: Patient Choice offered to / list presented to : Patient  Expected Discharge Plan and Services Expected Discharge Plan: [place] In-house Referral: NA Discharge Planning Services: CM Consult Post Acute Care Choice: [place] arrangements for the past 2 months: [place], [place] Expected Discharge Date: 07/03/18               DME Arranged: N/A         HH Arranged: RN, PT, OT, Nurse's Aide, Social Work [employer] Agency: [facility] Date [facility] Agency Contacted: 07/03/18 Time [place]: [place] Representative spoke with at [place]: Adela Lank  Prior Living Arrangements/Services Living arrangements for the past 2 months: [place], [place] Lives with:: Self Patient language and need for interpreter reviewed:: Yes Do you feel safe going back to the place where you live?: Yes      Need for Family Participation in Patient Care: Yes (Comment) Care giver support system in place?: Yes (comment) Current home services: DME(cane) Criminal  Activity/Legal Involvement Pertinent to Current Situation/Hospitalization: No - Comment as needed  Activities of Daily Living Home Assistive Devices/Equipment: Cane (specify quad or straight)(straight (no with pt )) ADL Screening (condition at time of admission) Patient's cognitive ability adequate to safely complete daily activities?: Yes Is the patient deaf or have difficulty hearing?: Yes(Lt and Rt hearing aids) Does the patient have difficulty seeing, even when wearing glasses/contacts?: No Does the patient have difficulty concentrating, remembering, or making decisions?: No Patient able to express need for assistance with ADLs?: Yes Does the patient have difficulty dressing or bathing?: No Independently performs ADLs?: Yes (appropriate for developmental age) Does the patient have difficulty walking or climbing stairs?: No Weakness of Legs: None Weakness of Arms/Hands: None  Permission Sought/Granted Permission sought to share information with : Case Manager, Family Supports, PCP, Other (comment) Permission granted to share information with : Yes, Verbal Permission Granted  Share Information with NAME: Sheryl Thorton  Permission granted to share info w AGENCY: [facility] granted to share info w Relationship: daughter  Permission granted to share info w Contact Information: [phone]  Emotional Assessment   Attitude/Demeanor/Rapport: Engaged Affect (typically observed): Accepting Orientation: : Oriented to Self, Oriented to Place, Oriented to  Time, Oriented to Situation   Psych Involvement: No (comment)  Admission diagnosis:  Hypokalemia [E87.6] Hypoxia [R09.02] Acute appendicitis, unspecified acute appendicitis type [K35.80] Urinary tract infection without hematuria, site unspecified [N39.0] Sepsis without acute organ  dysfunction, due to unspecified organism [facility]) [A41.9] Acute appendicitis [K35.80] Patient Active Problem List   Diagnosis Date Noted  .  Hypokalemia 06/24/2018  . Small bowel obstruction, partial, s/p lap adhesiolysis 06/24/2018 06/24/2018  . Incarcerated incisional hernia s/p primary repair 06/24/2018 06/24/2018  . Acute gangrenous appendicitis s/p lap appendectomy 06/24/2018 06/23/2018  . Aortic root enlargement ([place]) 08/19/2017  . Ascending aorta dilation ([facility]) 08/19/2017  . Essential hypertension 08/05/2017  . Mixed dyslipidemia 08/05/2017  . PVC (premature ventricular contraction) 08/05/2017  . Chest discomfort 08/05/2017  . Cough 09/23/2011   PCP:  Josetta Huddle, MD Pharmacy:   [id number] - [place], [place] - [address] AT [place] [address] [place] [address] Phone: [phone] Fax: [phone]     Social Determinants of Health (SDOH) Interventions    Readmission Risk Interventions No flowsheet data found.

## 2018-07-04 DIAGNOSIS — I2510 Atherosclerotic heart disease of native coronary artery without angina pectoris: Secondary | ICD-10-CM | POA: Diagnosis not present

## 2018-07-04 DIAGNOSIS — B9689 Other specified bacterial agents as the cause of diseases classified elsewhere: Secondary | ICD-10-CM | POA: Diagnosis not present

## 2018-07-04 DIAGNOSIS — I119 Hypertensive heart disease without heart failure: Secondary | ICD-10-CM | POA: Diagnosis not present

## 2018-07-04 DIAGNOSIS — I712 Thoracic aortic aneurysm, without rupture: Secondary | ICD-10-CM | POA: Diagnosis not present

## 2018-07-04 DIAGNOSIS — E890 Postprocedural hypothyroidism: Secondary | ICD-10-CM | POA: Diagnosis not present

## 2018-07-04 DIAGNOSIS — R8271 Bacteriuria: Secondary | ICD-10-CM | POA: Diagnosis not present

## 2018-07-04 DIAGNOSIS — T8143XD Infection following a procedure, organ and space surgical site, subsequent encounter: Secondary | ICD-10-CM | POA: Diagnosis not present

## 2018-07-04 DIAGNOSIS — K8020 Calculus of gallbladder without cholecystitis without obstruction: Secondary | ICD-10-CM | POA: Diagnosis not present

## 2018-07-04 DIAGNOSIS — B9620 Unspecified Escherichia coli [E. coli] as the cause of diseases classified elsewhere: Secondary | ICD-10-CM | POA: Diagnosis not present

## 2018-07-04 LAB — CULTURE, BLOOD (ROUTINE X 2)
Culture: NO GROWTH
Culture: NO GROWTH
Special Requests: ADEQUATE

## 2018-07-05 ENCOUNTER — Other Ambulatory Visit: Payer: Self-pay | Admitting: Surgery

## 2018-07-05 DIAGNOSIS — R8271 Bacteriuria: Secondary | ICD-10-CM | POA: Diagnosis not present

## 2018-07-05 DIAGNOSIS — E890 Postprocedural hypothyroidism: Secondary | ICD-10-CM | POA: Diagnosis not present

## 2018-07-05 DIAGNOSIS — B9620 Unspecified Escherichia coli [E. coli] as the cause of diseases classified elsewhere: Secondary | ICD-10-CM | POA: Diagnosis not present

## 2018-07-05 DIAGNOSIS — K8020 Calculus of gallbladder without cholecystitis without obstruction: Secondary | ICD-10-CM | POA: Diagnosis not present

## 2018-07-05 DIAGNOSIS — I119 Hypertensive heart disease without heart failure: Secondary | ICD-10-CM | POA: Diagnosis not present

## 2018-07-05 DIAGNOSIS — I712 Thoracic aortic aneurysm, without rupture: Secondary | ICD-10-CM | POA: Diagnosis not present

## 2018-07-05 DIAGNOSIS — T8143XD Infection following a procedure, organ and space surgical site, subsequent encounter: Secondary | ICD-10-CM | POA: Diagnosis not present

## 2018-07-05 DIAGNOSIS — T8143XA Infection following a procedure, organ and space surgical site, initial encounter: Secondary | ICD-10-CM

## 2018-07-05 DIAGNOSIS — B9689 Other specified bacterial agents as the cause of diseases classified elsewhere: Secondary | ICD-10-CM | POA: Diagnosis not present

## 2018-07-05 DIAGNOSIS — I2510 Atherosclerotic heart disease of native coronary artery without angina pectoris: Secondary | ICD-10-CM | POA: Diagnosis not present

## 2018-07-06 DIAGNOSIS — I2510 Atherosclerotic heart disease of native coronary artery without angina pectoris: Secondary | ICD-10-CM | POA: Diagnosis not present

## 2018-07-06 DIAGNOSIS — R8271 Bacteriuria: Secondary | ICD-10-CM | POA: Diagnosis not present

## 2018-07-06 DIAGNOSIS — B9620 Unspecified Escherichia coli [E. coli] as the cause of diseases classified elsewhere: Secondary | ICD-10-CM | POA: Diagnosis not present

## 2018-07-06 DIAGNOSIS — E890 Postprocedural hypothyroidism: Secondary | ICD-10-CM | POA: Diagnosis not present

## 2018-07-06 DIAGNOSIS — T8143XD Infection following a procedure, organ and space surgical site, subsequent encounter: Secondary | ICD-10-CM | POA: Diagnosis not present

## 2018-07-06 DIAGNOSIS — I119 Hypertensive heart disease without heart failure: Secondary | ICD-10-CM | POA: Diagnosis not present

## 2018-07-06 DIAGNOSIS — B9689 Other specified bacterial agents as the cause of diseases classified elsewhere: Secondary | ICD-10-CM | POA: Diagnosis not present

## 2018-07-06 DIAGNOSIS — K8020 Calculus of gallbladder without cholecystitis without obstruction: Secondary | ICD-10-CM | POA: Diagnosis not present

## 2018-07-06 DIAGNOSIS — I712 Thoracic aortic aneurysm, without rupture: Secondary | ICD-10-CM | POA: Diagnosis not present

## 2018-07-07 DIAGNOSIS — I2510 Atherosclerotic heart disease of native coronary artery without angina pectoris: Secondary | ICD-10-CM | POA: Diagnosis not present

## 2018-07-07 DIAGNOSIS — B9620 Unspecified Escherichia coli [E. coli] as the cause of diseases classified elsewhere: Secondary | ICD-10-CM | POA: Diagnosis not present

## 2018-07-07 DIAGNOSIS — E890 Postprocedural hypothyroidism: Secondary | ICD-10-CM | POA: Diagnosis not present

## 2018-07-07 DIAGNOSIS — T8143XD Infection following a procedure, organ and space surgical site, subsequent encounter: Secondary | ICD-10-CM | POA: Diagnosis not present

## 2018-07-07 DIAGNOSIS — K8020 Calculus of gallbladder without cholecystitis without obstruction: Secondary | ICD-10-CM | POA: Diagnosis not present

## 2018-07-07 DIAGNOSIS — B9689 Other specified bacterial agents as the cause of diseases classified elsewhere: Secondary | ICD-10-CM | POA: Diagnosis not present

## 2018-07-07 DIAGNOSIS — I119 Hypertensive heart disease without heart failure: Secondary | ICD-10-CM | POA: Diagnosis not present

## 2018-07-07 DIAGNOSIS — R8271 Bacteriuria: Secondary | ICD-10-CM | POA: Diagnosis not present

## 2018-07-07 DIAGNOSIS — I712 Thoracic aortic aneurysm, without rupture: Secondary | ICD-10-CM | POA: Diagnosis not present

## 2018-07-07 LAB — AEROBIC/ANAEROBIC CULTURE W GRAM STAIN (SURGICAL/DEEP WOUND)
Culture: NO GROWTH
Special Requests: NORMAL

## 2018-07-08 DIAGNOSIS — T8143XD Infection following a procedure, organ and space surgical site, subsequent encounter: Secondary | ICD-10-CM | POA: Diagnosis not present

## 2018-07-08 DIAGNOSIS — B9689 Other specified bacterial agents as the cause of diseases classified elsewhere: Secondary | ICD-10-CM | POA: Diagnosis not present

## 2018-07-08 DIAGNOSIS — I712 Thoracic aortic aneurysm, without rupture: Secondary | ICD-10-CM | POA: Diagnosis not present

## 2018-07-08 DIAGNOSIS — I2510 Atherosclerotic heart disease of native coronary artery without angina pectoris: Secondary | ICD-10-CM | POA: Diagnosis not present

## 2018-07-08 DIAGNOSIS — K8020 Calculus of gallbladder without cholecystitis without obstruction: Secondary | ICD-10-CM | POA: Diagnosis not present

## 2018-07-08 DIAGNOSIS — R8271 Bacteriuria: Secondary | ICD-10-CM | POA: Diagnosis not present

## 2018-07-08 DIAGNOSIS — I119 Hypertensive heart disease without heart failure: Secondary | ICD-10-CM | POA: Diagnosis not present

## 2018-07-08 DIAGNOSIS — B9620 Unspecified Escherichia coli [E. coli] as the cause of diseases classified elsewhere: Secondary | ICD-10-CM | POA: Diagnosis not present

## 2018-07-08 DIAGNOSIS — E890 Postprocedural hypothyroidism: Secondary | ICD-10-CM | POA: Diagnosis not present

## 2018-07-09 DIAGNOSIS — K37 Unspecified appendicitis: Secondary | ICD-10-CM | POA: Diagnosis not present

## 2018-07-12 DIAGNOSIS — B9689 Other specified bacterial agents as the cause of diseases classified elsewhere: Secondary | ICD-10-CM | POA: Diagnosis not present

## 2018-07-12 DIAGNOSIS — B9620 Unspecified Escherichia coli [E. coli] as the cause of diseases classified elsewhere: Secondary | ICD-10-CM | POA: Diagnosis not present

## 2018-07-12 DIAGNOSIS — R8271 Bacteriuria: Secondary | ICD-10-CM | POA: Diagnosis not present

## 2018-07-12 DIAGNOSIS — K8020 Calculus of gallbladder without cholecystitis without obstruction: Secondary | ICD-10-CM | POA: Diagnosis not present

## 2018-07-12 DIAGNOSIS — I119 Hypertensive heart disease without heart failure: Secondary | ICD-10-CM | POA: Diagnosis not present

## 2018-07-12 DIAGNOSIS — I2510 Atherosclerotic heart disease of native coronary artery without angina pectoris: Secondary | ICD-10-CM | POA: Diagnosis not present

## 2018-07-12 DIAGNOSIS — E890 Postprocedural hypothyroidism: Secondary | ICD-10-CM | POA: Diagnosis not present

## 2018-07-12 DIAGNOSIS — I712 Thoracic aortic aneurysm, without rupture: Secondary | ICD-10-CM | POA: Diagnosis not present

## 2018-07-12 DIAGNOSIS — T8143XD Infection following a procedure, organ and space surgical site, subsequent encounter: Secondary | ICD-10-CM | POA: Diagnosis not present

## 2018-07-13 DIAGNOSIS — I2510 Atherosclerotic heart disease of native coronary artery without angina pectoris: Secondary | ICD-10-CM | POA: Diagnosis not present

## 2018-07-13 DIAGNOSIS — B9620 Unspecified Escherichia coli [E. coli] as the cause of diseases classified elsewhere: Secondary | ICD-10-CM | POA: Diagnosis not present

## 2018-07-13 DIAGNOSIS — I119 Hypertensive heart disease without heart failure: Secondary | ICD-10-CM | POA: Diagnosis not present

## 2018-07-13 DIAGNOSIS — B9689 Other specified bacterial agents as the cause of diseases classified elsewhere: Secondary | ICD-10-CM | POA: Diagnosis not present

## 2018-07-13 DIAGNOSIS — K8020 Calculus of gallbladder without cholecystitis without obstruction: Secondary | ICD-10-CM | POA: Diagnosis not present

## 2018-07-13 DIAGNOSIS — I712 Thoracic aortic aneurysm, without rupture: Secondary | ICD-10-CM | POA: Diagnosis not present

## 2018-07-13 DIAGNOSIS — R8271 Bacteriuria: Secondary | ICD-10-CM | POA: Diagnosis not present

## 2018-07-13 DIAGNOSIS — T8143XD Infection following a procedure, organ and space surgical site, subsequent encounter: Secondary | ICD-10-CM | POA: Diagnosis not present

## 2018-07-13 DIAGNOSIS — E890 Postprocedural hypothyroidism: Secondary | ICD-10-CM | POA: Diagnosis not present

## 2018-07-14 ENCOUNTER — Ambulatory Visit
Admission: RE | Admit: 2018-07-14 | Discharge: 2018-07-14 | Disposition: A | Discharge status: Home / Self Care | Payer: Medicare HMO | Source: Ambulatory Visit | Attending: Radiology | Admitting: Radiology

## 2018-07-14 ENCOUNTER — Ambulatory Visit
Admission: RE | Admit: 2018-07-14 | Discharge: 2018-07-14 | Disposition: A | Discharge status: Home / Self Care | Payer: Medicare HMO | Source: Ambulatory Visit | Attending: Surgery | Admitting: Surgery

## 2018-07-14 ENCOUNTER — Encounter: Payer: Self-pay | Admitting: Radiology

## 2018-07-14 DIAGNOSIS — T8149XA Infection following a procedure, other surgical site, initial encounter: Secondary | ICD-10-CM | POA: Diagnosis not present

## 2018-07-14 DIAGNOSIS — K6811 Postprocedural retroperitoneal abscess: Secondary | ICD-10-CM | POA: Diagnosis not present

## 2018-07-14 DIAGNOSIS — T8143XA Infection following a procedure, organ and space surgical site, initial encounter: Secondary | ICD-10-CM

## 2018-07-14 DIAGNOSIS — Z434 Encounter for attention to other artificial openings of digestive tract: Secondary | ICD-10-CM | POA: Diagnosis not present

## 2018-07-14 HISTORY — PX: IR RADIOLOGIST EVAL & MGMT: IMG5224

## 2018-07-14 MED ORDER — IOPAMIDOL (ISOVUE-300) INJECTION 61%
100.0000 mL | Freq: Once | INTRAVENOUS | Status: AC | PRN
  Administered 2018-07-14: 100 mL via INTRAVENOUS

## 2018-07-14 NOTE — Progress Notes (Signed)
Chief Complaint: Post surgical abscess  Referring Physician(s): Dr. Johney Maine  Supervising Physician: Corrie Mckusick  History of Present Illness: Latoya Curry is a 85 y.o. female who is s/p lap appendectomy 06/24/18 for acute gangrenous appendicitis.  A JP drain was left in place  Despite IV antibiotics, she continued to have elevated WBC prompting additional abdominal imaging.   CT Abdomen/Pelvis 07/02/18 showed: 1. Status post interval appendectomy. There is a rim enhancing fluid collection in the low pelvis posterior to the uterus measuring at least 4.6 by 3.7 cm, this appearance concerning for abscess (series 2, image 66). There is additional higher attenuation fluid about the proximal jejunum, adjacent to the course of a surgical drain, which may represent hematoma or seroma (series 2, image 35).  IR was consulted for intra-abdominal fluid collection aspiration and drainage which was done by Dr. Laurence Ferrari 07/02/18.  She is here today for CT scan and injection.  She has kept excellent records of her output.  Output from transgluteal drain is less than 2 mL per day.  Output from the anterior drain is about 3-7 mL per day.  The drainage from both is pinkish clear.  She is not currently on antibiotics. No nausea/vomiting. No Fever/chills. ROS negative.   Past Medical History:  Diagnosis Date   Chronic low back pain    Diverticulitis    DJD (degenerative joint disease) of knee    Hypercholesteremia    Hypertension    Neurodermatitis    Obesity    Osteopenia    Recurrent UTI    Thoracic aortic aneurysm ([place])    Transient global amnesia     Past Surgical History:  Procedure Laterality Date   COLON RESECTION SIGMOID  2004   Dr Dalbert Batman   EXPLORATORY LAPAROTOMY  2004   r/o pancreas mass.  Dr Dalbert Batman   LAPAROSCOPIC APPENDECTOMY N/A 06/24/2018   Procedure: ACUTE GANGRENOUS APPENDICITIS WITH ABSCESS, PARTIAL SMALL BOWEL OBSTRUCTION, INCISIONAL  HERNIA REPAIR, LYSIS OF ADHESIONS;  Surgeon: Michael Boston, MD;  Location: [facility];  Service: General;  Laterality: N/A;   TOTAL KNEE ARTHROPLASTY     bilaterallly    Allergies: Codeine; Bactrim [sulfamethoxazole-trimethoprim]; Zestril [lisinopril]; and Zocor [simvastatin]  Medications: Prior to Admission medications   Medication Sig Start Date End Date Taking? Authorizing Provider  amLODipine (NORVASC) 2.5 MG tablet Take 2.5 mg by mouth daily.    [provider]  aspirin 81 MG tablet Take 81 mg by mouth See admin instructions. Monday, Wednesday and Friday    [provider]  atenolol-chlorthalidone (TENORETIC) 50-25 MG per tablet Take 1 tablet by mouth daily.    [provider]  cetirizine (ZYRTEC) 10 MG tablet Take 10 mg by mouth daily.    [provider]  cholecalciferol (VITAMIN D) 1000 UNITS tablet Take 1,000 Units by mouth daily.    [provider]  citalopram (CELEXA) 20 MG tablet Take 20 mg by mouth daily.    [provider]  omeprazole (PRILOSEC) 20 MG capsule Take 30- 60 min before your first and last meals of the day 09/23/11   Tanda Rockers, MD  potassium chloride (K-DUR) 10 MEQ tablet Take 10 mEq by mouth daily. 03/17/18   [provider]  rosuvastatin (CRESTOR) 10 MG tablet Take 5 mg by mouth See admin instructions. Monday, Wednesday and Friday    [provider]  TOVIAZ 8 MG TB24 tablet Take 8 mg by mouth daily. 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  Social Designer, fashion/clothing strain: Not on file   Food insecurity:     Worry: Not on file    Inability: Not on file   Transportation needs:    Medical: Not on file    Non-medical: Not on file  Tobacco Use   Smoking status: Never Smoker   Smokeless tobacco: Never Used  Substance and Sexual Activity   Alcohol use: No   Drug use: No   Sexual activity: Not on file  Lifestyle   Physical activity:    Days per week: Not on file    Minutes per session: Not on file   Stress: Not on file  Relationships   Social connections:    Talks on phone: Not on file    Gets together: Not on file    Attends religious service: Not on file    Active member of club or organization: Not on file    Attends meetings of clubs or organizations: Not on file    Relationship status: Not on file  Other Topics Concern   Not on file  Social History Narrative   Not on file     Review of Systems: A 12 point ROS discussed and pertinent positives are indicated in the HPI above.  All other systems are negative.  Review of Systems  Vital Signs: There were no vitals taken for this visit.  Physical Exam Awake and alert NAD Abdomen soft, NTND Both drains in place. Clear pink fluid in both bulbs Drain injection shows NO fistula connection to the bowel Will remove the TG drain    Imaging: Ct Abdomen Pelvis W Contrast  Result Date: 07/01/2018 CLINICAL DATA:  Abdominal infection, suspect abdominal abscess, status post appendectomy EXAM: CT ABDOMEN AND PELVIS WITH CONTRAST TECHNIQUE: Multidetector CT imaging of the abdomen and pelvis was performed using the standard protocol following bolus administration of intravenous contrast. CONTRAST:  152mL OMNIPAQUE IOHEXOL 300 MG/ML SOLN, additional oral enteric contrast COMPARISON:  CT abdomen pelvis, 06/23/2018 FINDINGS: Lower chest: No acute abnormality. Bibasilar scarring. Coronary artery calcifications. Hepatobiliary: No focal liver abnormality is seen. Calcified gallstone in the gallbladder fundus. No gallbladder wall  thickening, or biliary dilatation. Pancreas: Unremarkable. No pancreatic ductal dilatation or surrounding inflammatory changes. Spleen: Normal in size without focal abnormality. Adrenals/Urinary Tract: Adrenal glands are unremarkable. Kidneys are normal, without renal calculi, focal lesion, or hydronephrosis. Air within the urinary bladder, likely secondary to catheterization. Stomach/Bowel: Stomach is within normal limits. No evidence of bowel wall thickening, distention, or inflammatory changes. Sigmoid diverticulosis. Vascular/Lymphatic: Mixed calcific atherosclerosis. No enlarged abdominal or pelvic lymph nodes. Reproductive: Calcified fibroids.  Pessary present in vagina. Other: No abdominal wall hernia or abnormality. There is a rim enhancing fluid collection in the low pelvis posterior to the uterus measuring at least 4.6 by 3.7 cm (series 2, image 66). There is additional higher attenuation fluid about the proximal jejunum, adjacent to the course of a surgical drain (series 2, image 35). Musculoskeletal: No acute or significant osseous findings. IMPRESSION: 1. Status post interval appendectomy. There  is a rim enhancing fluid collection in the low pelvis posterior to the uterus measuring at least 4.6 by 3.7 cm, this appearance concerning for abscess (series 2, image 66). There is additional higher attenuation fluid about the proximal jejunum, adjacent to the course of a surgical drain, which may represent hematoma or seroma (series 2, image 35). 2.  Other chronic and incidental findings as detailed above. Electronically Signed   By: Eddie Candle M.D.   On: 07/01/2018 15:17   Ct Abdomen Pelvis W Contrast  Result Date: 06/23/2018 CLINICAL DATA:  85 year old female with lower abdominal pain EXAM: CT ABDOMEN AND PELVIS WITH CONTRAST TECHNIQUE: Multidetector CT imaging of the abdomen and pelvis was performed using the standard protocol following bolus administration of intravenous contrast. CONTRAST:  178mL  OMNIPAQUE IOHEXOL 300 MG/ML  SOLN COMPARISON:  None. FINDINGS: Lower chest: No acute finding of the lower chest. Atelectasis/scarring. Hepatobiliary: Unremarkable liver. Cholelithiasis. No associated inflammatory changes. Pancreas: Punctate calcifications of pancreas with no inflammatory changes. Spleen: Unremarkable Adrenals/Urinary Tract: Unremarkable adrenal glands. No hydronephrosis or nephrolithiasis. Unremarkable course the bilateral ureters. Unremarkable urinary bladder. Stomach/Bowel: Unremarkable stomach. Small bowel decompressed without abnormal distention. No focal wall thickening of small bowel. Inflammatory changes of the appendix which is dilated, fluid-filled, with fecalith at the base. No evidence of perforation or abscess. Extensive diverticular disease without evidence of acute diverticulitis. Vascular/Lymphatic: Atherosclerosis.  No adenopathy. Reproductive: Fibroid uterus with internal calcifications, otherwise unremarkable. Pessary in place. Other: Reactive fluid within the dependent pelvis.  No hernia. Musculoskeletal: Compression fracture of T12 is unchanged from previous CT. No acute displaced fracture. Multilevel degenerative changes. IMPRESSION: Acute uncomplicated appendicitis. These results were discussed by telephone at the time of interpretation on 06/23/2018 at 8:55 pm with Dr. Gareth Morgan. Reactive free fluid within the dependent pelvis. Diverticular disease without evidence of acute diverticulitis. Cholelithiasis. Aortic Atherosclerosis (ICD10-I70.0). Ancillary findings as above. Electronically Signed   By: Corrie Mckusick D.O.   On: 06/23/2018 20:56   Dg Chest Portable 1 View  Result Date: 06/23/2018 CLINICAL DATA:  Fever. Hypoxia EXAM: PORTABLE CHEST 1 VIEW COMPARISON:  06/12/2016 FINDINGS: Stable mild cardiomegaly. Aortic atherosclerosis. Both lungs are clear. IMPRESSION: Stable mild cardiomegaly. No active lung disease. Electronically Signed   By: Earle Gell M.D.   On:  06/23/2018 18:24   Ct Image Guided Drainage By Percutaneous Catheter  Result Date: 07/02/2018 INDICATION: 85 year old female with peripherally enhancing fluid collection in the pelvic cul-de-sac after laparoscopic appendectomy. She presents for drain placement. EXAM: CT-guided drain placement, pelvis MEDICATIONS: The patient is currently admitted to the hospital and receiving intravenous antibiotics. The antibiotics were administered within an appropriate time frame prior to the initiation of the procedure. ANESTHESIA/SEDATION: Fentanyl 100 mcg IV; Versed 2 mg IV Moderate Sedation Time:  13 minutes The patient was continuously monitored during the procedure by the interventional radiology nurse under my direct supervision. COMPLICATIONS: None immediate. PROCEDURE: Informed written consent was obtained from the patient after a thorough discussion of the procedural risks, benefits and alternatives. All questions were addressed. Maximal Sterile Barrier Technique was utilized including caps, mask, sterile gowns, sterile gloves, sterile drape, hand hygiene and skin antiseptic. A timeout was performed prior to the initiation of the procedure. A planning axial CT scan was performed. The fluid collection was successfully identified. A suitable skin entry site was selected and marked. The overlying skin was sterilely prepped and draped in the standard fashion using chlorhexidine skin prep. Local anesthesia was attained by infiltration with 1% lidocaine. A small  dermatotomy was made. Under intermittent CT guidance, a an 18 gauge introducer needle was carefully advanced into the fluid collection. A 0.035 wire was then coiled in the fluid collection and the tract dilated to 10 [place]. A Cook 10.2 [place] all-purpose drainage catheter was then advanced over the wire and formed in the fluid collection. Aspiration yields approximately 20 mL of turbid serosanguineous fluid. A sample was sent for culture. The drainage catheter was  then secured to the skin with 0 Prolene suture and an adhesive fixation device. Post drain placement CT imaging demonstrates the drainage catheter well positioned in the prior fluid collection. IMPRESSION: Successful placement of a 10 French drainage catheter into the pelvic cul-de-sac fluid collection. Aspiration yields approximately 15 mL of turbid serosanguineous fluid. Samples were sent for culture. PLAN: Maintain drain to JP bulb suction. Flush once per shift. If drain output is scant for more than 48 hours and patient is clinically improved, the drainage catheter can be removed. No drain injection or additional imaging required. Electronically Signed   By: Jacqulynn Cadet M.D.   On: 07/02/2018 16:40    Labs:  CBC: Recent Labs    06/30/18 0349 07/01/18 0351 07/02/18 0444 07/03/18 0448  WBC 12.1* 15.3* 10.9* 12.7*  HGB 12.3 12.3 11.2* 12.1  HCT 39.7 39.0 34.6* 38.6  PLT 360 389 338 394    COAGS: Recent Labs    07/02/18 0444  INR 1.4*    BMP: Recent Labs    06/28/18 0355 06/29/18 0307 06/30/18 0349 07/02/18 0444  NA 136 138 138 137  K 3.0* 3.6 3.8 3.0*  CL 100 105 104 102  CO2 29 24 26 28   GLUCOSE 109* 116* 99 95  BUN 10 6* 6* 7*  CALCIUM 8.4* 8.5* 8.6* 8.3*  CREATININE 0.65 0.70 0.66 0.62  GFRNONAA >60 >60 >60 >60  GFRAA >60 >60 >60 >60    LIVER FUNCTION TESTS: Recent Labs    08/25/17 0910 06/23/18 1719 07/02/18 0444  BILITOT 0.6 1.7* 0.6  AST 26 29 20   ALT 10 16 16   ALKPHOS 72 58 42  PROT 7.1 7.6 5.5*  ALBUMIN 4.4 4.0 2.7*    TUMOR MARKERS: No results for input(s): AFPTM, CEA, CA199, CHROMGRNA in the last 8760 hours.  Assessment/Plan:  S/P lap appendectomy with post surgical pelvic fluid collection.  CT images reviewed by Dr. Earleen Newport. There is resolution of the fluid collection and the cultures showed now growth.  TG drain removed without difficulty.  The Surgical drain remains in place and will be removed per the surgery team per Dr.  Earleen Newport.   Electronically Signed: Murrell Redden PA-C 07/14/2018, 1:08 PM    Please refer to Dr. Pasty Arch attestation of this note for management and plan.

## 2018-07-15 DIAGNOSIS — R8271 Bacteriuria: Secondary | ICD-10-CM | POA: Diagnosis not present

## 2018-07-15 DIAGNOSIS — T8143XD Infection following a procedure, organ and space surgical site, subsequent encounter: Secondary | ICD-10-CM | POA: Diagnosis not present

## 2018-07-15 DIAGNOSIS — B9689 Other specified bacterial agents as the cause of diseases classified elsewhere: Secondary | ICD-10-CM | POA: Diagnosis not present

## 2018-07-15 DIAGNOSIS — I119 Hypertensive heart disease without heart failure: Secondary | ICD-10-CM | POA: Diagnosis not present

## 2018-07-15 DIAGNOSIS — B9620 Unspecified Escherichia coli [E. coli] as the cause of diseases classified elsewhere: Secondary | ICD-10-CM | POA: Diagnosis not present

## 2018-07-15 DIAGNOSIS — I2510 Atherosclerotic heart disease of native coronary artery without angina pectoris: Secondary | ICD-10-CM | POA: Diagnosis not present

## 2018-07-15 DIAGNOSIS — I712 Thoracic aortic aneurysm, without rupture: Secondary | ICD-10-CM | POA: Diagnosis not present

## 2018-07-15 DIAGNOSIS — E890 Postprocedural hypothyroidism: Secondary | ICD-10-CM | POA: Diagnosis not present

## 2018-07-15 DIAGNOSIS — K8020 Calculus of gallbladder without cholecystitis without obstruction: Secondary | ICD-10-CM | POA: Diagnosis not present

## 2018-07-20 DIAGNOSIS — B9689 Other specified bacterial agents as the cause of diseases classified elsewhere: Secondary | ICD-10-CM | POA: Diagnosis not present

## 2018-07-20 DIAGNOSIS — I712 Thoracic aortic aneurysm, without rupture: Secondary | ICD-10-CM | POA: Diagnosis not present

## 2018-07-20 DIAGNOSIS — R8271 Bacteriuria: Secondary | ICD-10-CM | POA: Diagnosis not present

## 2018-07-20 DIAGNOSIS — E890 Postprocedural hypothyroidism: Secondary | ICD-10-CM | POA: Diagnosis not present

## 2018-07-20 DIAGNOSIS — B9620 Unspecified Escherichia coli [E. coli] as the cause of diseases classified elsewhere: Secondary | ICD-10-CM | POA: Diagnosis not present

## 2018-07-20 DIAGNOSIS — H109 Unspecified conjunctivitis: Secondary | ICD-10-CM | POA: Diagnosis not present

## 2018-07-20 DIAGNOSIS — T8143XD Infection following a procedure, organ and space surgical site, subsequent encounter: Secondary | ICD-10-CM | POA: Diagnosis not present

## 2018-07-20 DIAGNOSIS — I2510 Atherosclerotic heart disease of native coronary artery without angina pectoris: Secondary | ICD-10-CM | POA: Diagnosis not present

## 2018-07-20 DIAGNOSIS — K8020 Calculus of gallbladder without cholecystitis without obstruction: Secondary | ICD-10-CM | POA: Diagnosis not present

## 2018-07-20 DIAGNOSIS — I119 Hypertensive heart disease without heart failure: Secondary | ICD-10-CM | POA: Diagnosis not present

## 2018-07-23 DIAGNOSIS — T8143XD Infection following a procedure, organ and space surgical site, subsequent encounter: Secondary | ICD-10-CM | POA: Diagnosis not present

## 2018-07-23 DIAGNOSIS — B9620 Unspecified Escherichia coli [E. coli] as the cause of diseases classified elsewhere: Secondary | ICD-10-CM | POA: Diagnosis not present

## 2018-07-23 DIAGNOSIS — I119 Hypertensive heart disease without heart failure: Secondary | ICD-10-CM | POA: Diagnosis not present

## 2018-07-23 DIAGNOSIS — K8020 Calculus of gallbladder without cholecystitis without obstruction: Secondary | ICD-10-CM | POA: Diagnosis not present

## 2018-07-23 DIAGNOSIS — R8271 Bacteriuria: Secondary | ICD-10-CM | POA: Diagnosis not present

## 2018-07-23 DIAGNOSIS — I712 Thoracic aortic aneurysm, without rupture: Secondary | ICD-10-CM | POA: Diagnosis not present

## 2018-07-23 DIAGNOSIS — E890 Postprocedural hypothyroidism: Secondary | ICD-10-CM | POA: Diagnosis not present

## 2018-07-23 DIAGNOSIS — B9689 Other specified bacterial agents as the cause of diseases classified elsewhere: Secondary | ICD-10-CM | POA: Diagnosis not present

## 2018-07-23 DIAGNOSIS — I2510 Atherosclerotic heart disease of native coronary artery without angina pectoris: Secondary | ICD-10-CM | POA: Diagnosis not present

## 2018-07-26 DIAGNOSIS — B9689 Other specified bacterial agents as the cause of diseases classified elsewhere: Secondary | ICD-10-CM | POA: Diagnosis not present

## 2018-07-26 DIAGNOSIS — E890 Postprocedural hypothyroidism: Secondary | ICD-10-CM | POA: Diagnosis not present

## 2018-07-26 DIAGNOSIS — I2510 Atherosclerotic heart disease of native coronary artery without angina pectoris: Secondary | ICD-10-CM | POA: Diagnosis not present

## 2018-07-26 DIAGNOSIS — B9620 Unspecified Escherichia coli [E. coli] as the cause of diseases classified elsewhere: Secondary | ICD-10-CM | POA: Diagnosis not present

## 2018-07-26 DIAGNOSIS — R8271 Bacteriuria: Secondary | ICD-10-CM | POA: Diagnosis not present

## 2018-07-26 DIAGNOSIS — K8020 Calculus of gallbladder without cholecystitis without obstruction: Secondary | ICD-10-CM | POA: Diagnosis not present

## 2018-07-26 DIAGNOSIS — I712 Thoracic aortic aneurysm, without rupture: Secondary | ICD-10-CM | POA: Diagnosis not present

## 2018-07-26 DIAGNOSIS — I119 Hypertensive heart disease without heart failure: Secondary | ICD-10-CM | POA: Diagnosis not present

## 2018-07-26 DIAGNOSIS — T8143XD Infection following a procedure, organ and space surgical site, subsequent encounter: Secondary | ICD-10-CM | POA: Diagnosis not present

## 2018-07-27 DIAGNOSIS — I2510 Atherosclerotic heart disease of native coronary artery without angina pectoris: Secondary | ICD-10-CM | POA: Diagnosis not present

## 2018-07-27 DIAGNOSIS — B9689 Other specified bacterial agents as the cause of diseases classified elsewhere: Secondary | ICD-10-CM | POA: Diagnosis not present

## 2018-07-27 DIAGNOSIS — I119 Hypertensive heart disease without heart failure: Secondary | ICD-10-CM | POA: Diagnosis not present

## 2018-07-27 DIAGNOSIS — T8143XD Infection following a procedure, organ and space surgical site, subsequent encounter: Secondary | ICD-10-CM | POA: Diagnosis not present

## 2018-07-27 DIAGNOSIS — R8271 Bacteriuria: Secondary | ICD-10-CM | POA: Diagnosis not present

## 2018-07-27 DIAGNOSIS — E890 Postprocedural hypothyroidism: Secondary | ICD-10-CM | POA: Diagnosis not present

## 2018-07-27 DIAGNOSIS — B9620 Unspecified Escherichia coli [E. coli] as the cause of diseases classified elsewhere: Secondary | ICD-10-CM | POA: Diagnosis not present

## 2018-07-27 DIAGNOSIS — K8020 Calculus of gallbladder without cholecystitis without obstruction: Secondary | ICD-10-CM | POA: Diagnosis not present

## 2018-07-27 DIAGNOSIS — I712 Thoracic aortic aneurysm, without rupture: Secondary | ICD-10-CM | POA: Diagnosis not present

## 2018-07-28 DIAGNOSIS — I10 Essential (primary) hypertension: Secondary | ICD-10-CM | POA: Diagnosis not present

## 2018-07-28 DIAGNOSIS — H109 Unspecified conjunctivitis: Secondary | ICD-10-CM | POA: Diagnosis not present

## 2018-07-28 DIAGNOSIS — R1030 Lower abdominal pain, unspecified: Secondary | ICD-10-CM | POA: Diagnosis not present

## 2018-07-28 DIAGNOSIS — E559 Vitamin D deficiency, unspecified: Secondary | ICD-10-CM | POA: Diagnosis not present

## 2018-07-28 DIAGNOSIS — E0590 Thyrotoxicosis, unspecified without thyrotoxic crisis or storm: Secondary | ICD-10-CM | POA: Diagnosis not present

## 2018-07-28 DIAGNOSIS — I712 Thoracic aortic aneurysm, without rupture: Secondary | ICD-10-CM | POA: Diagnosis not present

## 2018-08-02 DIAGNOSIS — B9689 Other specified bacterial agents as the cause of diseases classified elsewhere: Secondary | ICD-10-CM | POA: Diagnosis not present

## 2018-08-02 DIAGNOSIS — E890 Postprocedural hypothyroidism: Secondary | ICD-10-CM | POA: Diagnosis not present

## 2018-08-02 DIAGNOSIS — K8020 Calculus of gallbladder without cholecystitis without obstruction: Secondary | ICD-10-CM | POA: Diagnosis not present

## 2018-08-02 DIAGNOSIS — R8271 Bacteriuria: Secondary | ICD-10-CM | POA: Diagnosis not present

## 2018-08-02 DIAGNOSIS — I2510 Atherosclerotic heart disease of native coronary artery without angina pectoris: Secondary | ICD-10-CM | POA: Diagnosis not present

## 2018-08-02 DIAGNOSIS — I712 Thoracic aortic aneurysm, without rupture: Secondary | ICD-10-CM | POA: Diagnosis not present

## 2018-08-02 DIAGNOSIS — I119 Hypertensive heart disease without heart failure: Secondary | ICD-10-CM | POA: Diagnosis not present

## 2018-08-02 DIAGNOSIS — T8143XD Infection following a procedure, organ and space surgical site, subsequent encounter: Secondary | ICD-10-CM | POA: Diagnosis not present

## 2018-08-02 DIAGNOSIS — B9620 Unspecified Escherichia coli [E. coli] as the cause of diseases classified elsewhere: Secondary | ICD-10-CM | POA: Diagnosis not present

## 2018-08-03 DIAGNOSIS — B9689 Other specified bacterial agents as the cause of diseases classified elsewhere: Secondary | ICD-10-CM | POA: Diagnosis not present

## 2018-08-03 DIAGNOSIS — T8143XD Infection following a procedure, organ and space surgical site, subsequent encounter: Secondary | ICD-10-CM | POA: Diagnosis not present

## 2018-08-03 DIAGNOSIS — K8020 Calculus of gallbladder without cholecystitis without obstruction: Secondary | ICD-10-CM | POA: Diagnosis not present

## 2018-08-03 DIAGNOSIS — I119 Hypertensive heart disease without heart failure: Secondary | ICD-10-CM | POA: Diagnosis not present

## 2018-08-03 DIAGNOSIS — B9620 Unspecified Escherichia coli [E. coli] as the cause of diseases classified elsewhere: Secondary | ICD-10-CM | POA: Diagnosis not present

## 2018-08-03 DIAGNOSIS — I712 Thoracic aortic aneurysm, without rupture: Secondary | ICD-10-CM | POA: Diagnosis not present

## 2018-08-03 DIAGNOSIS — R8271 Bacteriuria: Secondary | ICD-10-CM | POA: Diagnosis not present

## 2018-08-03 DIAGNOSIS — E890 Postprocedural hypothyroidism: Secondary | ICD-10-CM | POA: Diagnosis not present

## 2018-08-03 DIAGNOSIS — I2510 Atherosclerotic heart disease of native coronary artery without angina pectoris: Secondary | ICD-10-CM | POA: Diagnosis not present

## 2018-08-04 DIAGNOSIS — H02005 Unspecified entropion of left lower eyelid: Secondary | ICD-10-CM | POA: Diagnosis not present

## 2018-08-09 DIAGNOSIS — B9689 Other specified bacterial agents as the cause of diseases classified elsewhere: Secondary | ICD-10-CM | POA: Diagnosis not present

## 2018-08-09 DIAGNOSIS — T8143XD Infection following a procedure, organ and space surgical site, subsequent encounter: Secondary | ICD-10-CM | POA: Diagnosis not present

## 2018-08-09 DIAGNOSIS — R8271 Bacteriuria: Secondary | ICD-10-CM | POA: Diagnosis not present

## 2018-08-09 DIAGNOSIS — I712 Thoracic aortic aneurysm, without rupture: Secondary | ICD-10-CM | POA: Diagnosis not present

## 2018-08-09 DIAGNOSIS — K8020 Calculus of gallbladder without cholecystitis without obstruction: Secondary | ICD-10-CM | POA: Diagnosis not present

## 2018-08-09 DIAGNOSIS — I119 Hypertensive heart disease without heart failure: Secondary | ICD-10-CM | POA: Diagnosis not present

## 2018-08-09 DIAGNOSIS — E890 Postprocedural hypothyroidism: Secondary | ICD-10-CM | POA: Diagnosis not present

## 2018-08-09 DIAGNOSIS — B9620 Unspecified Escherichia coli [E. coli] as the cause of diseases classified elsewhere: Secondary | ICD-10-CM | POA: Diagnosis not present

## 2018-08-09 DIAGNOSIS — I2510 Atherosclerotic heart disease of native coronary artery without angina pectoris: Secondary | ICD-10-CM | POA: Diagnosis not present

## 2018-08-11 DIAGNOSIS — I712 Thoracic aortic aneurysm, without rupture: Secondary | ICD-10-CM | POA: Diagnosis not present

## 2018-08-11 DIAGNOSIS — T8143XD Infection following a procedure, organ and space surgical site, subsequent encounter: Secondary | ICD-10-CM | POA: Diagnosis not present

## 2018-08-11 DIAGNOSIS — B9689 Other specified bacterial agents as the cause of diseases classified elsewhere: Secondary | ICD-10-CM | POA: Diagnosis not present

## 2018-08-11 DIAGNOSIS — K8020 Calculus of gallbladder without cholecystitis without obstruction: Secondary | ICD-10-CM | POA: Diagnosis not present

## 2018-08-11 DIAGNOSIS — R8271 Bacteriuria: Secondary | ICD-10-CM | POA: Diagnosis not present

## 2018-08-11 DIAGNOSIS — E0590 Thyrotoxicosis, unspecified without thyrotoxic crisis or storm: Secondary | ICD-10-CM | POA: Diagnosis not present

## 2018-08-11 DIAGNOSIS — I119 Hypertensive heart disease without heart failure: Secondary | ICD-10-CM | POA: Diagnosis not present

## 2018-08-11 DIAGNOSIS — I2510 Atherosclerotic heart disease of native coronary artery without angina pectoris: Secondary | ICD-10-CM | POA: Diagnosis not present

## 2018-08-11 DIAGNOSIS — E890 Postprocedural hypothyroidism: Secondary | ICD-10-CM | POA: Diagnosis not present

## 2018-08-11 DIAGNOSIS — B9620 Unspecified Escherichia coli [E. coli] as the cause of diseases classified elsewhere: Secondary | ICD-10-CM | POA: Diagnosis not present

## 2018-08-12 DIAGNOSIS — I712 Thoracic aortic aneurysm, without rupture: Secondary | ICD-10-CM | POA: Diagnosis not present

## 2018-08-12 DIAGNOSIS — I119 Hypertensive heart disease without heart failure: Secondary | ICD-10-CM | POA: Diagnosis not present

## 2018-08-12 DIAGNOSIS — B9620 Unspecified Escherichia coli [E. coli] as the cause of diseases classified elsewhere: Secondary | ICD-10-CM | POA: Diagnosis not present

## 2018-08-12 DIAGNOSIS — K8020 Calculus of gallbladder without cholecystitis without obstruction: Secondary | ICD-10-CM | POA: Diagnosis not present

## 2018-08-12 DIAGNOSIS — B9689 Other specified bacterial agents as the cause of diseases classified elsewhere: Secondary | ICD-10-CM | POA: Diagnosis not present

## 2018-08-12 DIAGNOSIS — T8143XD Infection following a procedure, organ and space surgical site, subsequent encounter: Secondary | ICD-10-CM | POA: Diagnosis not present

## 2018-08-12 DIAGNOSIS — R8271 Bacteriuria: Secondary | ICD-10-CM | POA: Diagnosis not present

## 2018-08-12 DIAGNOSIS — I2510 Atherosclerotic heart disease of native coronary artery without angina pectoris: Secondary | ICD-10-CM | POA: Diagnosis not present

## 2018-08-12 DIAGNOSIS — E890 Postprocedural hypothyroidism: Secondary | ICD-10-CM | POA: Diagnosis not present

## 2018-08-16 DIAGNOSIS — T8143XD Infection following a procedure, organ and space surgical site, subsequent encounter: Secondary | ICD-10-CM | POA: Diagnosis not present

## 2018-08-16 DIAGNOSIS — I712 Thoracic aortic aneurysm, without rupture: Secondary | ICD-10-CM | POA: Diagnosis not present

## 2018-08-16 DIAGNOSIS — E890 Postprocedural hypothyroidism: Secondary | ICD-10-CM | POA: Diagnosis not present

## 2018-08-16 DIAGNOSIS — I119 Hypertensive heart disease without heart failure: Secondary | ICD-10-CM | POA: Diagnosis not present

## 2018-08-16 DIAGNOSIS — I2510 Atherosclerotic heart disease of native coronary artery without angina pectoris: Secondary | ICD-10-CM | POA: Diagnosis not present

## 2018-08-16 DIAGNOSIS — B9689 Other specified bacterial agents as the cause of diseases classified elsewhere: Secondary | ICD-10-CM | POA: Diagnosis not present

## 2018-08-16 DIAGNOSIS — K8020 Calculus of gallbladder without cholecystitis without obstruction: Secondary | ICD-10-CM | POA: Diagnosis not present

## 2018-08-16 DIAGNOSIS — R8271 Bacteriuria: Secondary | ICD-10-CM | POA: Diagnosis not present

## 2018-08-16 DIAGNOSIS — B9620 Unspecified Escherichia coli [E. coli] as the cause of diseases classified elsewhere: Secondary | ICD-10-CM | POA: Diagnosis not present

## 2018-08-18 DIAGNOSIS — I2510 Atherosclerotic heart disease of native coronary artery without angina pectoris: Secondary | ICD-10-CM | POA: Diagnosis not present

## 2018-08-18 DIAGNOSIS — I712 Thoracic aortic aneurysm, without rupture: Secondary | ICD-10-CM | POA: Diagnosis not present

## 2018-08-18 DIAGNOSIS — T8143XD Infection following a procedure, organ and space surgical site, subsequent encounter: Secondary | ICD-10-CM | POA: Diagnosis not present

## 2018-08-18 DIAGNOSIS — B9689 Other specified bacterial agents as the cause of diseases classified elsewhere: Secondary | ICD-10-CM | POA: Diagnosis not present

## 2018-08-18 DIAGNOSIS — R8271 Bacteriuria: Secondary | ICD-10-CM | POA: Diagnosis not present

## 2018-08-18 DIAGNOSIS — B9620 Unspecified Escherichia coli [E. coli] as the cause of diseases classified elsewhere: Secondary | ICD-10-CM | POA: Diagnosis not present

## 2018-08-18 DIAGNOSIS — I119 Hypertensive heart disease without heart failure: Secondary | ICD-10-CM | POA: Diagnosis not present

## 2018-08-18 DIAGNOSIS — E890 Postprocedural hypothyroidism: Secondary | ICD-10-CM | POA: Diagnosis not present

## 2018-08-18 DIAGNOSIS — K8020 Calculus of gallbladder without cholecystitis without obstruction: Secondary | ICD-10-CM | POA: Diagnosis not present

## 2018-08-19 DIAGNOSIS — K8020 Calculus of gallbladder without cholecystitis without obstruction: Secondary | ICD-10-CM | POA: Diagnosis not present

## 2018-08-19 DIAGNOSIS — B9620 Unspecified Escherichia coli [E. coli] as the cause of diseases classified elsewhere: Secondary | ICD-10-CM | POA: Diagnosis not present

## 2018-08-19 DIAGNOSIS — I2510 Atherosclerotic heart disease of native coronary artery without angina pectoris: Secondary | ICD-10-CM | POA: Diagnosis not present

## 2018-08-19 DIAGNOSIS — B9689 Other specified bacterial agents as the cause of diseases classified elsewhere: Secondary | ICD-10-CM | POA: Diagnosis not present

## 2018-08-19 DIAGNOSIS — I119 Hypertensive heart disease without heart failure: Secondary | ICD-10-CM | POA: Diagnosis not present

## 2018-08-19 DIAGNOSIS — I712 Thoracic aortic aneurysm, without rupture: Secondary | ICD-10-CM | POA: Diagnosis not present

## 2018-08-19 DIAGNOSIS — R8271 Bacteriuria: Secondary | ICD-10-CM | POA: Diagnosis not present

## 2018-08-19 DIAGNOSIS — T8143XD Infection following a procedure, organ and space surgical site, subsequent encounter: Secondary | ICD-10-CM | POA: Diagnosis not present

## 2018-08-19 DIAGNOSIS — E890 Postprocedural hypothyroidism: Secondary | ICD-10-CM | POA: Diagnosis not present

## 2018-08-24 DIAGNOSIS — I119 Hypertensive heart disease without heart failure: Secondary | ICD-10-CM | POA: Diagnosis not present

## 2018-08-24 DIAGNOSIS — I712 Thoracic aortic aneurysm, without rupture: Secondary | ICD-10-CM | POA: Diagnosis not present

## 2018-08-24 DIAGNOSIS — R8271 Bacteriuria: Secondary | ICD-10-CM | POA: Diagnosis not present

## 2018-08-24 DIAGNOSIS — B9620 Unspecified Escherichia coli [E. coli] as the cause of diseases classified elsewhere: Secondary | ICD-10-CM | POA: Diagnosis not present

## 2018-08-24 DIAGNOSIS — T8143XD Infection following a procedure, organ and space surgical site, subsequent encounter: Secondary | ICD-10-CM | POA: Diagnosis not present

## 2018-08-24 DIAGNOSIS — B9689 Other specified bacterial agents as the cause of diseases classified elsewhere: Secondary | ICD-10-CM | POA: Diagnosis not present

## 2018-08-24 DIAGNOSIS — I2510 Atherosclerotic heart disease of native coronary artery without angina pectoris: Secondary | ICD-10-CM | POA: Diagnosis not present

## 2018-08-24 DIAGNOSIS — E890 Postprocedural hypothyroidism: Secondary | ICD-10-CM | POA: Diagnosis not present

## 2018-08-24 DIAGNOSIS — K8020 Calculus of gallbladder without cholecystitis without obstruction: Secondary | ICD-10-CM | POA: Diagnosis not present

## 2018-08-25 DIAGNOSIS — Z961 Presence of intraocular lens: Secondary | ICD-10-CM | POA: Diagnosis not present

## 2018-08-25 DIAGNOSIS — Z9889 Other specified postprocedural states: Secondary | ICD-10-CM | POA: Diagnosis not present

## 2018-08-25 DIAGNOSIS — H04123 Dry eye syndrome of bilateral lacrimal glands: Secondary | ICD-10-CM | POA: Diagnosis not present

## 2018-08-31 DIAGNOSIS — E890 Postprocedural hypothyroidism: Secondary | ICD-10-CM | POA: Diagnosis not present

## 2018-08-31 DIAGNOSIS — I712 Thoracic aortic aneurysm, without rupture: Secondary | ICD-10-CM | POA: Diagnosis not present

## 2018-08-31 DIAGNOSIS — B9620 Unspecified Escherichia coli [E. coli] as the cause of diseases classified elsewhere: Secondary | ICD-10-CM | POA: Diagnosis not present

## 2018-08-31 DIAGNOSIS — T8143XD Infection following a procedure, organ and space surgical site, subsequent encounter: Secondary | ICD-10-CM | POA: Diagnosis not present

## 2018-08-31 DIAGNOSIS — K8020 Calculus of gallbladder without cholecystitis without obstruction: Secondary | ICD-10-CM | POA: Diagnosis not present

## 2018-08-31 DIAGNOSIS — B9689 Other specified bacterial agents as the cause of diseases classified elsewhere: Secondary | ICD-10-CM | POA: Diagnosis not present

## 2018-08-31 DIAGNOSIS — R8271 Bacteriuria: Secondary | ICD-10-CM | POA: Diagnosis not present

## 2018-08-31 DIAGNOSIS — I119 Hypertensive heart disease without heart failure: Secondary | ICD-10-CM | POA: Diagnosis not present

## 2018-08-31 DIAGNOSIS — I2510 Atherosclerotic heart disease of native coronary artery without angina pectoris: Secondary | ICD-10-CM | POA: Diagnosis not present

## 2018-09-27 DIAGNOSIS — E0590 Thyrotoxicosis, unspecified without thyrotoxic crisis or storm: Secondary | ICD-10-CM | POA: Diagnosis not present

## 2018-10-20 DIAGNOSIS — N8111 Cystocele, midline: Secondary | ICD-10-CM | POA: Diagnosis not present

## 2018-11-04 DIAGNOSIS — Z85828 Personal history of other malignant neoplasm of skin: Secondary | ICD-10-CM | POA: Diagnosis not present

## 2018-11-04 DIAGNOSIS — L820 Inflamed seborrheic keratosis: Secondary | ICD-10-CM | POA: Diagnosis not present

## 2018-11-04 DIAGNOSIS — L821 Other seborrheic keratosis: Secondary | ICD-10-CM | POA: Diagnosis not present

## 2018-11-04 DIAGNOSIS — Z08 Encounter for follow-up examination after completed treatment for malignant neoplasm: Secondary | ICD-10-CM | POA: Diagnosis not present

## 2018-11-04 DIAGNOSIS — L570 Actinic keratosis: Secondary | ICD-10-CM | POA: Diagnosis not present

## 2018-12-06 ENCOUNTER — Other Ambulatory Visit: Payer: Self-pay | Admitting: Cardiothoracic Surgery

## 2018-12-06 DIAGNOSIS — I7789 Other specified disorders of arteries and arterioles: Secondary | ICD-10-CM

## 2018-12-06 DIAGNOSIS — I712 Thoracic aortic aneurysm, without rupture, unspecified: Secondary | ICD-10-CM

## 2019-01-12 ENCOUNTER — Ambulatory Visit: Payer: Medicare HMO | Admitting: Cardiothoracic Surgery

## 2019-01-12 ENCOUNTER — Encounter: Payer: Self-pay | Admitting: Cardiothoracic Surgery

## 2019-01-12 ENCOUNTER — Other Ambulatory Visit: Payer: Self-pay

## 2019-01-12 ENCOUNTER — Ambulatory Visit
Admission: RE | Admit: 2019-01-12 | Discharge: 2019-01-12 | Disposition: A | Discharge status: Home / Self Care | Payer: Medicare HMO | Source: Ambulatory Visit | Attending: Cardiothoracic Surgery | Admitting: Cardiothoracic Surgery

## 2019-01-12 VITALS — BP 140/70 | HR 54 | Temp 97.3°F | Resp 20 | Ht 66.0 in | Wt 155.0 lb

## 2019-01-12 DIAGNOSIS — I712 Thoracic aortic aneurysm, without rupture, unspecified: Secondary | ICD-10-CM

## 2019-01-12 MED ORDER — IOPAMIDOL (ISOVUE-300) INJECTION 61%
75.0000 mL | Freq: Once | INTRAVENOUS | Status: AC | PRN
  Administered 2019-01-12: 13:00:00 75 mL via INTRAVENOUS

## 2019-01-12 NOTE — Progress Notes (Signed)
PCP is Josetta Huddle, MD Referring Provider is Josetta Huddle, MD  Chief Complaint  Patient presents with  . Thoracic Aortic Aneurysm    9 month f/u with CTA Chest    HPI: 85 year old hypertensive female returns with CTA.  She was found to have a 5.5 cm ascending fusiform aneurysm after she had a presyncopal event during a dental procedure.  Echocardiogram at that time showed fairly normal EF and no aortic valve stenosis with 2+ moderate AI.  I have followed her for 18 months with serial scans and today's scan is personally reviewed and discussed with patient and daughter.  Her blood pressure is now better controlled 140/80.  She is taking amlodipine and metoprolol.  She denies chest pain or other episodes of presyncope.  CTA performed today shows the ascending aorta to measure 5.2 cm by the sagittal orientation of the aorta.  The aortic wall is smooth without mural thickening or ulceration.  Lungs are clear.  Patient does not monitor blood pressure at her assisted living facility but will start making a record with a goal of keeping systolic blood pressure less than 140.   Past Medical History:  Diagnosis Date  . Chronic low back pain   . Diverticulitis   . DJD (degenerative joint disease) of knee   . Hypercholesteremia   . Hypertension   . Neurodermatitis   . Obesity   . Osteopenia   . Recurrent UTI   . Thoracic aortic aneurysm ([place])   . Transient global amnesia     Past Surgical History:  Procedure Laterality Date  . COLON RESECTION SIGMOID  2004   Dr Dalbert Batman  . EXPLORATORY LAPAROTOMY  2004   r/o pancreas mass.  Dr Dalbert Batman  . IR RADIOLOGIST EVAL & MGMT  07/14/2018  . LAPAROSCOPIC APPENDECTOMY N/A 06/24/2018   Procedure: ACUTE GANGRENOUS APPENDICITIS WITH ABSCESS, PARTIAL SMALL BOWEL OBSTRUCTION, INCISIONAL HERNIA REPAIR, LYSIS OF ADHESIONS;  Surgeon: Michael Boston, MD;  Location: [facility];  Service: General;  Laterality: N/A;  . TOTAL KNEE ARTHROPLASTY     bilaterallly    Family  History  Problem Relation Age of Onset  . Heart disease Father   . Leukemia Mother     Social History Social History   Tobacco Use  . Smoking status: Never Smoker  . Smokeless tobacco: Never Used  Substance Use Topics  . Alcohol use: No  . Drug use: No    Current Outpatient Medications  Medication Sig Dispense Refill  . amLODipine (NORVASC) 2.5 MG tablet Take 2.5 mg by mouth daily.    Marland Kitchen aspirin 81 MG tablet Take 81 mg by mouth See admin instructions. Monday, Wednesday and Friday    . atenolol-chlorthalidone (TENORETIC) 50-25 MG per tablet Take 1 tablet by mouth daily.    . cetirizine (ZYRTEC) 10 MG tablet Take 10 mg by mouth daily.    . cholecalciferol (VITAMIN D) 1000 UNITS tablet Take 1,000 Units by mouth daily.    . citalopram (CELEXA) 20 MG tablet Take 20 mg by mouth daily.    Marland Kitchen omeprazole (PRILOSEC) 20 MG capsule Take 30- 60 min before your first and last meals of the day    . potassium chloride (K-DUR) 10 MEQ tablet Take 10 mEq by mouth daily.    . rosuvastatin (CRESTOR) 10 MG tablet Take 5 mg by mouth See admin instructions. Monday, Wednesday and Friday    . TOVIAZ 8 MG TB24 tablet Take 8 mg by mouth daily.     No current facility-administered  medications for this visit.     Allergies  Allergen Reactions  . Codeine Nausea And Vomiting  . Bactrim [Sulfamethoxazole-Trimethoprim] Other (See Comments)    unknown  . Zestril [Lisinopril] Cough  . Zocor [Simvastatin] Other (See Comments)    unknown    Review of Systems  No falls Received a flu influenza vaccine No edema No shortness of breath Some rare wheezing No difficulty swallowing No abdominal pain No chest pain  BP 140/64   Pulse (!) 54   Temp (!) 97.3 F (36.3 C) (Skin)   Resp 20   Ht 5\' 6"  (1.676 m)   Wt 155 lb (70.3 kg)   SpO2 92% Comment: RA  BMI 25.02 kg/m  Physical Exam      Exam    General- alert and comfortable    Neck- no JVD, no cervical adenopathy palpable, no carotid bruit    Lungs- clear without rales, wheezes   Cor- regular rate and rhythm, no murmur , gallop   Abdomen- soft, non-tender   Extremities - warm, non-tender, minimal edema   Neuro- oriented, appropriate, no focal weakness   Diagnostic Tests: CTA image shows the ascending aorta to measure 5.2 cm. Some variation in the diameter is due to technique, timing of the cardiac cycle for the image and orientation. Impression: Stable ascending aneurysm, 5.2 cm in diameter Risk of tear/dissection is 4-5% however the risk for surgical repair would be 25%. Plan: Continue medical therapy with blood pressure control being the key.  She will monitor her blood pressure more carefully.  She will avoid Levaquin/quinolones which have been shown to fragment the connective tissue matrix of the aortic wall and are associated with higher risk for aortic dissection. Continue with annual CTA surveillance scans.  Len Childs, MD [facility] [phone]

## 2019-02-09 DIAGNOSIS — M545 Low back pain: Secondary | ICD-10-CM | POA: Diagnosis not present

## 2019-02-09 DIAGNOSIS — E7800 Pure hypercholesterolemia, unspecified: Secondary | ICD-10-CM | POA: Diagnosis not present

## 2019-02-09 DIAGNOSIS — L309 Dermatitis, unspecified: Secondary | ICD-10-CM | POA: Diagnosis not present

## 2019-02-09 DIAGNOSIS — Z1389 Encounter for screening for other disorder: Secondary | ICD-10-CM | POA: Diagnosis not present

## 2019-02-09 DIAGNOSIS — E559 Vitamin D deficiency, unspecified: Secondary | ICD-10-CM | POA: Diagnosis not present

## 2019-02-09 DIAGNOSIS — M5432 Sciatica, left side: Secondary | ICD-10-CM | POA: Diagnosis not present

## 2019-02-09 DIAGNOSIS — I10 Essential (primary) hypertension: Secondary | ICD-10-CM | POA: Diagnosis not present

## 2019-02-09 DIAGNOSIS — E0500 Thyrotoxicosis with diffuse goiter without thyrotoxic crisis or storm: Secondary | ICD-10-CM | POA: Diagnosis not present

## 2019-02-09 DIAGNOSIS — H9193 Unspecified hearing loss, bilateral: Secondary | ICD-10-CM | POA: Diagnosis not present

## 2019-02-09 DIAGNOSIS — Z0000 Encounter for general adult medical examination without abnormal findings: Secondary | ICD-10-CM | POA: Diagnosis not present

## 2019-02-11 DIAGNOSIS — N8111 Cystocele, midline: Secondary | ICD-10-CM | POA: Diagnosis not present

## 2019-02-17 DIAGNOSIS — M545 Low back pain: Secondary | ICD-10-CM | POA: Diagnosis not present

## 2019-02-17 DIAGNOSIS — M8580 Other specified disorders of bone density and structure, unspecified site: Secondary | ICD-10-CM | POA: Diagnosis not present

## 2019-02-17 DIAGNOSIS — M5432 Sciatica, left side: Secondary | ICD-10-CM | POA: Diagnosis not present

## 2019-02-17 DIAGNOSIS — I10 Essential (primary) hypertension: Secondary | ICD-10-CM | POA: Diagnosis not present

## 2019-02-17 DIAGNOSIS — M778 Other enthesopathies, not elsewhere classified: Secondary | ICD-10-CM | POA: Diagnosis not present

## 2019-02-17 DIAGNOSIS — H9193 Unspecified hearing loss, bilateral: Secondary | ICD-10-CM | POA: Diagnosis not present

## 2019-02-17 DIAGNOSIS — M1990 Unspecified osteoarthritis, unspecified site: Secondary | ICD-10-CM | POA: Diagnosis not present

## 2019-02-17 DIAGNOSIS — E0500 Thyrotoxicosis with diffuse goiter without thyrotoxic crisis or storm: Secondary | ICD-10-CM | POA: Diagnosis not present

## 2019-02-17 DIAGNOSIS — E7800 Pure hypercholesterolemia, unspecified: Secondary | ICD-10-CM | POA: Diagnosis not present

## 2019-02-23 DIAGNOSIS — M545 Low back pain: Secondary | ICD-10-CM | POA: Diagnosis not present

## 2019-02-23 DIAGNOSIS — H9193 Unspecified hearing loss, bilateral: Secondary | ICD-10-CM | POA: Diagnosis not present

## 2019-02-23 DIAGNOSIS — M5432 Sciatica, left side: Secondary | ICD-10-CM | POA: Diagnosis not present

## 2019-02-23 DIAGNOSIS — E0500 Thyrotoxicosis with diffuse goiter without thyrotoxic crisis or storm: Secondary | ICD-10-CM | POA: Diagnosis not present

## 2019-02-23 DIAGNOSIS — M778 Other enthesopathies, not elsewhere classified: Secondary | ICD-10-CM | POA: Diagnosis not present

## 2019-02-23 DIAGNOSIS — M1990 Unspecified osteoarthritis, unspecified site: Secondary | ICD-10-CM | POA: Diagnosis not present

## 2019-02-23 DIAGNOSIS — I10 Essential (primary) hypertension: Secondary | ICD-10-CM | POA: Diagnosis not present

## 2019-02-23 DIAGNOSIS — E7800 Pure hypercholesterolemia, unspecified: Secondary | ICD-10-CM | POA: Diagnosis not present

## 2019-02-23 DIAGNOSIS — M8580 Other specified disorders of bone density and structure, unspecified site: Secondary | ICD-10-CM | POA: Diagnosis not present

## 2019-02-28 DIAGNOSIS — I10 Essential (primary) hypertension: Secondary | ICD-10-CM | POA: Diagnosis not present

## 2019-02-28 DIAGNOSIS — M5432 Sciatica, left side: Secondary | ICD-10-CM | POA: Diagnosis not present

## 2019-02-28 DIAGNOSIS — M8580 Other specified disorders of bone density and structure, unspecified site: Secondary | ICD-10-CM | POA: Diagnosis not present

## 2019-02-28 DIAGNOSIS — E7800 Pure hypercholesterolemia, unspecified: Secondary | ICD-10-CM | POA: Diagnosis not present

## 2019-02-28 DIAGNOSIS — M1990 Unspecified osteoarthritis, unspecified site: Secondary | ICD-10-CM | POA: Diagnosis not present

## 2019-02-28 DIAGNOSIS — M778 Other enthesopathies, not elsewhere classified: Secondary | ICD-10-CM | POA: Diagnosis not present

## 2019-02-28 DIAGNOSIS — H9193 Unspecified hearing loss, bilateral: Secondary | ICD-10-CM | POA: Diagnosis not present

## 2019-02-28 DIAGNOSIS — E0500 Thyrotoxicosis with diffuse goiter without thyrotoxic crisis or storm: Secondary | ICD-10-CM | POA: Diagnosis not present

## 2019-02-28 DIAGNOSIS — M545 Low back pain: Secondary | ICD-10-CM | POA: Diagnosis not present

## 2019-03-02 DIAGNOSIS — M5432 Sciatica, left side: Secondary | ICD-10-CM | POA: Diagnosis not present

## 2019-03-02 DIAGNOSIS — M545 Low back pain: Secondary | ICD-10-CM | POA: Diagnosis not present

## 2019-03-02 DIAGNOSIS — E0500 Thyrotoxicosis with diffuse goiter without thyrotoxic crisis or storm: Secondary | ICD-10-CM | POA: Diagnosis not present

## 2019-03-02 DIAGNOSIS — I10 Essential (primary) hypertension: Secondary | ICD-10-CM | POA: Diagnosis not present

## 2019-03-02 DIAGNOSIS — E7800 Pure hypercholesterolemia, unspecified: Secondary | ICD-10-CM | POA: Diagnosis not present

## 2019-03-02 DIAGNOSIS — M8580 Other specified disorders of bone density and structure, unspecified site: Secondary | ICD-10-CM | POA: Diagnosis not present

## 2019-03-02 DIAGNOSIS — H9193 Unspecified hearing loss, bilateral: Secondary | ICD-10-CM | POA: Diagnosis not present

## 2019-03-02 DIAGNOSIS — M1990 Unspecified osteoarthritis, unspecified site: Secondary | ICD-10-CM | POA: Diagnosis not present

## 2019-03-02 DIAGNOSIS — M778 Other enthesopathies, not elsewhere classified: Secondary | ICD-10-CM | POA: Diagnosis not present

## 2019-03-10 DIAGNOSIS — E0500 Thyrotoxicosis with diffuse goiter without thyrotoxic crisis or storm: Secondary | ICD-10-CM | POA: Diagnosis not present

## 2019-03-10 DIAGNOSIS — E7800 Pure hypercholesterolemia, unspecified: Secondary | ICD-10-CM | POA: Diagnosis not present

## 2019-03-10 DIAGNOSIS — H9193 Unspecified hearing loss, bilateral: Secondary | ICD-10-CM | POA: Diagnosis not present

## 2019-03-10 DIAGNOSIS — M778 Other enthesopathies, not elsewhere classified: Secondary | ICD-10-CM | POA: Diagnosis not present

## 2019-03-10 DIAGNOSIS — M8580 Other specified disorders of bone density and structure, unspecified site: Secondary | ICD-10-CM | POA: Diagnosis not present

## 2019-03-10 DIAGNOSIS — I10 Essential (primary) hypertension: Secondary | ICD-10-CM | POA: Diagnosis not present

## 2019-03-10 DIAGNOSIS — M1990 Unspecified osteoarthritis, unspecified site: Secondary | ICD-10-CM | POA: Diagnosis not present

## 2019-03-10 DIAGNOSIS — M5432 Sciatica, left side: Secondary | ICD-10-CM | POA: Diagnosis not present

## 2019-03-10 DIAGNOSIS — M545 Low back pain: Secondary | ICD-10-CM | POA: Diagnosis not present

## 2019-03-16 ENCOUNTER — Encounter: Payer: Self-pay | Admitting: Orthopaedic Surgery

## 2019-03-16 ENCOUNTER — Ambulatory Visit: Payer: Medicare HMO | Admitting: Orthopaedic Surgery

## 2019-03-16 ENCOUNTER — Other Ambulatory Visit: Payer: Self-pay

## 2019-03-16 ENCOUNTER — Ambulatory Visit (INDEPENDENT_AMBULATORY_CARE_PROVIDER_SITE_OTHER): Payer: Medicare HMO

## 2019-03-16 VITALS — Ht 66.0 in | Wt 155.0 lb

## 2019-03-16 DIAGNOSIS — M25552 Pain in left hip: Secondary | ICD-10-CM

## 2019-03-16 HISTORY — DX: Pain in left hip: M25.552

## 2019-03-16 MED ORDER — BUPIVACAINE HCL 0.5 % IJ SOLN
2.0000 mL | INTRAMUSCULAR | Status: AC | PRN
  Administered 2019-03-16: 17:00:00 2 mL via INTRA_ARTICULAR

## 2019-03-16 MED ORDER — METHYLPREDNISOLONE ACETATE 40 MG/ML IJ SUSP
80.0000 mg | INTRAMUSCULAR | Status: AC | PRN
  Administered 2019-03-16: 17:00:00 80 mg via INTRA_ARTICULAR

## 2019-03-16 MED ORDER — LIDOCAINE HCL 1 % IJ SOLN
2.0000 mL | INTRAMUSCULAR | Status: AC | PRN
  Administered 2019-03-16: 17:00:00 2 mL

## 2019-03-16 NOTE — Progress Notes (Signed)
Office Visit Note   Patient: Latoya Curry           Date of Birth: 1930/12/16           MRN: [id number] Visit Date: 03/16/2019              Requested by: Josetta Huddle, MD [address] [place],  [place] [address] PCP: Josetta Huddle, MD   Assessment & Plan: Visit Diagnoses:  1. Pain in left hip     Plan: Lateral left hip pain appears to be related to a bursa over the tips of the 2 previously inserted cannulated screws.  No other obvious abnormality by x-ray.  Will inject this area with cortisone and monitor response.  Initially much better after the injection  Follow-Up Instructions: Return if symptoms worsen or fail to improve.   Orders:  Orders Placed This Encounter  Procedures  . Large Joint Inj: L greater trochanter  . XR HIP UNILAT W OR W/O PELVIS 2-3 VIEWS LEFT   No orders of the defined types were placed in this encounter.     Procedures: Large Joint Inj: L greater trochanter on 03/16/2019 5:09 PM Indications: pain and diagnostic evaluation Details: 25 G 1.5 in needle, lateral approach  Arthrogram: No  Medications: 2 mL lidocaine 1 %; 2 mL bupivacaine 0.5 %; 80 mg methylPREDNISolone acetate 40 MG/ML Procedure, treatment alternatives, risks and benefits explained, specific risks discussed. Consent was given by the patient. Immediately prior to procedure a time out was called to verify the correct patient, procedure, equipment, support staff and site/side marked as required. Patient was prepped and draped in the usual sterile fashion.       Clinical Data: No additional findings.   Subjective: Chief Complaint  Patient presents with  . Left Hip - Pain  Patient presents today for left hip pain. She said that it started hurting more a month. Her pain is located only laterally. No groin pain or buttock pain. She said that the pain does not radiate. The pain is worse if she tries to raise her leg. No numbness or tingling in her lower extremities.  She has been taking tramadol or aleve if needed. She has had previous left hip surgery many years ago after falling in the shower and fracturing her hip. She also states that she has aneurysm on her aorta.   HPI  Review of Systems   Objective: Vital Signs: Ht 5\' 6"  (1.676 m)   Wt 155 lb (70.3 kg)   BMI 25.02 kg/m   Physical Exam Constitutional:      Appearance: She is well-developed.  Eyes:     Pupils: Pupils are equal, round, and reactive to light.  Pulmonary:     Effort: Pulmonary effort is normal.  Skin:    General: Skin is warm and dry.  Neurological:     Mental Status: She is alert and oriented to person, place, and time.  Psychiatric:        Behavior: Behavior normal.     Ortho Exam tip with painless range of motion.  Has lateral hip incision that is well-healed from surgery many years ago.  I can feel the tips of the pins were and still was tender directly over them.  No back pain.  No buttock discomfort.  No thigh pain  Specialty Comments:  No specialty comments available.  Imaging: XR HIP UNILAT W OR W/O PELVIS 2-3 VIEWS LEFT  Result Date: 03/16/2019 AP the pelvis demonstrates 2  cannulated screws in the left hip for prior fracture.  No acute changes.  Pins are well-positioned without evidence of extrusion.  No evidence of arthritis.  The screw heads are prominent by less than a centimeter off the surface of the greater trochanter which certainly could cause a bursitis    PMFS History: Patient Active Problem List   Diagnosis Date Noted  . Pain in left hip 03/16/2019  . Hypokalemia 06/24/2018  . Small bowel obstruction, partial, s/p lap adhesiolysis 06/24/2018 06/24/2018  . Incarcerated incisional hernia s/p primary repair 06/24/2018 06/24/2018  . Acute gangrenous appendicitis s/p lap appendectomy 06/24/2018 06/23/2018  . Aortic root enlargement ([place]) 08/19/2017  . Ascending aorta dilation ([facility]) 08/19/2017  . Essential hypertension 08/05/2017  . Mixed  dyslipidemia 08/05/2017  . PVC (premature ventricular contraction) 08/05/2017  . Chest discomfort 08/05/2017  . Cough 09/23/2011   Past Medical History:  Diagnosis Date  . Chronic low back pain   . Diverticulitis   . DJD (degenerative joint disease) of knee   . Hypercholesteremia   . Hypertension   . Neurodermatitis   . Obesity   . Osteopenia   . Recurrent UTI   . Thoracic aortic aneurysm ([place])   . Transient global amnesia     Family History  Problem Relation Age of Onset  . Heart disease Father   . Leukemia Mother     Past Surgical History:  Procedure Laterality Date  . COLON RESECTION SIGMOID  2004   Dr Dalbert Batman  . EXPLORATORY LAPAROTOMY  2004   r/o pancreas mass.  Dr Dalbert Batman  . IR RADIOLOGIST EVAL & MGMT  07/14/2018  . LAPAROSCOPIC APPENDECTOMY N/A 06/24/2018   Procedure: ACUTE GANGRENOUS APPENDICITIS WITH ABSCESS, PARTIAL SMALL BOWEL OBSTRUCTION, INCISIONAL HERNIA REPAIR, LYSIS OF ADHESIONS;  Surgeon: Michael Boston, MD;  Location: [facility];  Service: General;  Laterality: N/A;  . TOTAL KNEE ARTHROPLASTY     bilaterallly   Social History   Occupational History  . 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 Smoking status: Never Smoker  . Smokeless tobacco: Never Used  Substance and Sexual Activity  . Alcohol use: No  . Drug use: No  . Sexual activity: Not on file

## 2019-03-19 DIAGNOSIS — E0500 Thyrotoxicosis with diffuse goiter without thyrotoxic crisis or storm: Secondary | ICD-10-CM | POA: Diagnosis not present

## 2019-03-19 DIAGNOSIS — E7800 Pure hypercholesterolemia, unspecified: Secondary | ICD-10-CM | POA: Diagnosis not present

## 2019-03-19 DIAGNOSIS — M778 Other enthesopathies, not elsewhere classified: Secondary | ICD-10-CM | POA: Diagnosis not present

## 2019-03-19 DIAGNOSIS — H9193 Unspecified hearing loss, bilateral: Secondary | ICD-10-CM | POA: Diagnosis not present

## 2019-03-19 DIAGNOSIS — M1990 Unspecified osteoarthritis, unspecified site: Secondary | ICD-10-CM | POA: Diagnosis not present

## 2019-03-19 DIAGNOSIS — M5432 Sciatica, left side: Secondary | ICD-10-CM | POA: Diagnosis not present

## 2019-03-19 DIAGNOSIS — I10 Essential (primary) hypertension: Secondary | ICD-10-CM | POA: Diagnosis not present

## 2019-03-19 DIAGNOSIS — M8580 Other specified disorders of bone density and structure, unspecified site: Secondary | ICD-10-CM | POA: Diagnosis not present

## 2019-03-19 DIAGNOSIS — M545 Low back pain: Secondary | ICD-10-CM | POA: Diagnosis not present

## 2019-03-24 DIAGNOSIS — R3915 Urgency of urination: Secondary | ICD-10-CM | POA: Diagnosis not present

## 2019-03-24 DIAGNOSIS — E0590 Thyrotoxicosis, unspecified without thyrotoxic crisis or storm: Secondary | ICD-10-CM | POA: Diagnosis not present

## 2019-03-25 DIAGNOSIS — H9193 Unspecified hearing loss, bilateral: Secondary | ICD-10-CM | POA: Diagnosis not present

## 2019-03-25 DIAGNOSIS — M8580 Other specified disorders of bone density and structure, unspecified site: Secondary | ICD-10-CM | POA: Diagnosis not present

## 2019-03-25 DIAGNOSIS — M545 Low back pain: Secondary | ICD-10-CM | POA: Diagnosis not present

## 2019-03-25 DIAGNOSIS — M5432 Sciatica, left side: Secondary | ICD-10-CM | POA: Diagnosis not present

## 2019-03-25 DIAGNOSIS — E7800 Pure hypercholesterolemia, unspecified: Secondary | ICD-10-CM | POA: Diagnosis not present

## 2019-03-25 DIAGNOSIS — M778 Other enthesopathies, not elsewhere classified: Secondary | ICD-10-CM | POA: Diagnosis not present

## 2019-03-25 DIAGNOSIS — I10 Essential (primary) hypertension: Secondary | ICD-10-CM | POA: Diagnosis not present

## 2019-03-25 DIAGNOSIS — E0500 Thyrotoxicosis with diffuse goiter without thyrotoxic crisis or storm: Secondary | ICD-10-CM | POA: Diagnosis not present

## 2019-03-25 DIAGNOSIS — M1990 Unspecified osteoarthritis, unspecified site: Secondary | ICD-10-CM | POA: Diagnosis not present

## 2019-03-31 DIAGNOSIS — E0500 Thyrotoxicosis with diffuse goiter without thyrotoxic crisis or storm: Secondary | ICD-10-CM | POA: Diagnosis not present

## 2019-03-31 DIAGNOSIS — M778 Other enthesopathies, not elsewhere classified: Secondary | ICD-10-CM | POA: Diagnosis not present

## 2019-03-31 DIAGNOSIS — M5432 Sciatica, left side: Secondary | ICD-10-CM | POA: Diagnosis not present

## 2019-03-31 DIAGNOSIS — E7800 Pure hypercholesterolemia, unspecified: Secondary | ICD-10-CM | POA: Diagnosis not present

## 2019-03-31 DIAGNOSIS — H9193 Unspecified hearing loss, bilateral: Secondary | ICD-10-CM | POA: Diagnosis not present

## 2019-03-31 DIAGNOSIS — I10 Essential (primary) hypertension: Secondary | ICD-10-CM | POA: Diagnosis not present

## 2019-03-31 DIAGNOSIS — M8580 Other specified disorders of bone density and structure, unspecified site: Secondary | ICD-10-CM | POA: Diagnosis not present

## 2019-03-31 DIAGNOSIS — M1990 Unspecified osteoarthritis, unspecified site: Secondary | ICD-10-CM | POA: Diagnosis not present

## 2019-03-31 DIAGNOSIS — M545 Low back pain: Secondary | ICD-10-CM | POA: Diagnosis not present

## 2019-04-08 DIAGNOSIS — E7800 Pure hypercholesterolemia, unspecified: Secondary | ICD-10-CM | POA: Diagnosis not present

## 2019-04-08 DIAGNOSIS — M545 Low back pain: Secondary | ICD-10-CM | POA: Diagnosis not present

## 2019-04-08 DIAGNOSIS — E0500 Thyrotoxicosis with diffuse goiter without thyrotoxic crisis or storm: Secondary | ICD-10-CM | POA: Diagnosis not present

## 2019-04-08 DIAGNOSIS — M8580 Other specified disorders of bone density and structure, unspecified site: Secondary | ICD-10-CM | POA: Diagnosis not present

## 2019-04-08 DIAGNOSIS — M1990 Unspecified osteoarthritis, unspecified site: Secondary | ICD-10-CM | POA: Diagnosis not present

## 2019-04-08 DIAGNOSIS — M5432 Sciatica, left side: Secondary | ICD-10-CM | POA: Diagnosis not present

## 2019-04-08 DIAGNOSIS — H9193 Unspecified hearing loss, bilateral: Secondary | ICD-10-CM | POA: Diagnosis not present

## 2019-04-08 DIAGNOSIS — I10 Essential (primary) hypertension: Secondary | ICD-10-CM | POA: Diagnosis not present

## 2019-04-08 DIAGNOSIS — M778 Other enthesopathies, not elsewhere classified: Secondary | ICD-10-CM | POA: Diagnosis not present

## 2019-04-15 DIAGNOSIS — H9193 Unspecified hearing loss, bilateral: Secondary | ICD-10-CM | POA: Diagnosis not present

## 2019-04-15 DIAGNOSIS — M1990 Unspecified osteoarthritis, unspecified site: Secondary | ICD-10-CM | POA: Diagnosis not present

## 2019-04-15 DIAGNOSIS — I10 Essential (primary) hypertension: Secondary | ICD-10-CM | POA: Diagnosis not present

## 2019-04-15 DIAGNOSIS — E0500 Thyrotoxicosis with diffuse goiter without thyrotoxic crisis or storm: Secondary | ICD-10-CM | POA: Diagnosis not present

## 2019-04-15 DIAGNOSIS — M545 Low back pain: Secondary | ICD-10-CM | POA: Diagnosis not present

## 2019-04-15 DIAGNOSIS — M778 Other enthesopathies, not elsewhere classified: Secondary | ICD-10-CM | POA: Diagnosis not present

## 2019-04-15 DIAGNOSIS — M5432 Sciatica, left side: Secondary | ICD-10-CM | POA: Diagnosis not present

## 2019-04-15 DIAGNOSIS — M8580 Other specified disorders of bone density and structure, unspecified site: Secondary | ICD-10-CM | POA: Diagnosis not present

## 2019-04-15 DIAGNOSIS — E7800 Pure hypercholesterolemia, unspecified: Secondary | ICD-10-CM | POA: Diagnosis not present

## 2019-05-10 ENCOUNTER — Ambulatory Visit: Payer: Medicare HMO | Admitting: Cardiology

## 2019-05-10 ENCOUNTER — Other Ambulatory Visit: Payer: Self-pay

## 2019-05-10 ENCOUNTER — Encounter: Payer: Self-pay | Admitting: Cardiology

## 2019-05-10 VITALS — BP 120/74 | HR 75 | Ht 66.0 in | Wt 166.0 lb

## 2019-05-10 DIAGNOSIS — I10 Essential (primary) hypertension: Secondary | ICD-10-CM | POA: Diagnosis not present

## 2019-05-10 DIAGNOSIS — I351 Nonrheumatic aortic (valve) insufficiency: Secondary | ICD-10-CM

## 2019-05-10 DIAGNOSIS — I77810 Thoracic aortic ectasia: Secondary | ICD-10-CM | POA: Diagnosis not present

## 2019-05-10 DIAGNOSIS — E782 Mixed hyperlipidemia: Secondary | ICD-10-CM | POA: Diagnosis not present

## 2019-05-10 HISTORY — DX: Nonrheumatic aortic (valve) insufficiency: I35.1

## 2019-05-10 NOTE — Progress Notes (Signed)
Cardiology Office Note:    Date:  05/10/2019   ID:  Latoya Curry, DOB Jan 29, 1931, MRN [id number]  PCP:  Josetta Huddle, MD  Cardiologist:  Jenean Lindau, MD   Referring MD: Josetta Huddle, MD    ASSESSMENT:    1. Ascending aorta dilation (HCC)   2. Essential hypertension   3. Mixed dyslipidemia   4. Aortic valve insufficiency, etiology of cardiac valve disease unspecified    PLAN:    In order of problems listed above:  1. Secondary prevention stressed to the patient.  Importance of compliance with diet and medication stressed and she vocalized understanding.  She tells me that her primary care physician has done all her blood work recently and she was told it is fine.  Her daughter agrees. 2. Ascending aortic dilatation/aneurysm: Stable at this time and followed by our surgical Colleagues.  CT scan report is detailed below. 3. Essential hypertension: Blood pressure is stable and diet was discussed 4. Mixed dyslipidemia: Diet was discussed and patient is doing well with her diet and walking.  Lipids followed by primary care physician. 5. Moderate aortic regurgitation: Stable at this time and we will follow up with an echocardiogram in the next visit. 6. Patient will be seen in follow-up appointment in 6 months or earlier if the patient has any concerns    Medication Adjustments/Labs and Tests Ordered: Current medicines are reviewed at length with the patient today.  Concerns regarding medicines are outlined above.  Orders Placed This Encounter  Procedures  . EKG 12-Lead   No orders of the defined types were placed in this encounter.    Chief Complaint  Patient presents with  . Follow-up    1 Year     History of Present Illness:    Latoya Curry is a 86 y.o. female.  Patient has history of essential hypertension, moderate aortic regurgitation, dilated aneurysmal ascending aorta followed by cardiovascular and thoracic surgery.  She denies any problems at this  time and takes care of activities of daily living.  No chest pain orthopnea or PND.  She ambulates age appropriately.  She has had an acute abdomen for which she was treated earlier last year.  She is completely recovered from it.  At the time of my evaluation, the patient is alert awake oriented and in no distress.  Past Medical History:  Diagnosis Date  . Acute gangrenous appendicitis s/p lap appendectomy 06/24/2018 06/23/2018  . Aortic root enlargement ([place]) 08/19/2017  . Ascending aorta dilation ([place]) 08/19/2017  . Chest discomfort 08/05/2017  . Chronic low back pain   . Cough 09/23/2011   Followed in Pulmonary clinic/ [facility]/ [place]   - Sinus CT 09/25/2011 > neg   . Diverticulitis   . DJD (degenerative joint disease) of knee   . Essential hypertension 08/05/2017  . Hypercholesteremia   . Hypertension   . Hypokalemia 06/24/2018  . Incarcerated incisional hernia s/p primary repair 06/24/2018 06/24/2018  . Mixed dyslipidemia 08/05/2017  . Neurodermatitis   . Obesity   . Osteopenia   . Pain in left hip 03/16/2019  . PVC (premature ventricular contraction) 08/05/2017  . Recurrent UTI   . Small bowel obstruction, partial, s/p lap adhesiolysis 06/24/2018 06/24/2018  . Thoracic aortic aneurysm ([place])   . Transient global amnesia     Past Surgical History:  Procedure Laterality Date  . COLON RESECTION SIGMOID  2004   Dr Dalbert Batman  . EXPLORATORY LAPAROTOMY  2004   r/o pancreas mass.  Dr Dalbert Batman  . IR RADIOLOGIST EVAL & MGMT  07/14/2018  . LAPAROSCOPIC APPENDECTOMY N/A 06/24/2018   Procedure: ACUTE GANGRENOUS APPENDICITIS WITH ABSCESS, PARTIAL SMALL BOWEL OBSTRUCTION, INCISIONAL HERNIA REPAIR, LYSIS OF ADHESIONS;  Surgeon: Michael Boston, MD;  Location: [facility];  Service: General;  Laterality: N/A;  . TOTAL KNEE ARTHROPLASTY     bilaterallly    Current Medications: Current Meds  Medication Sig  . amLODipine (NORVASC) 2.5 MG tablet Take 2.5 mg by mouth daily.  Marland Kitchen aspirin 81 MG tablet Take 81  mg by mouth See admin instructions. Monday, Wednesday and Friday  . atenolol-chlorthalidone (TENORETIC) 50-25 MG per tablet Take 1 tablet by mouth daily.  . cetirizine (ZYRTEC) 10 MG tablet Take 10 mg by mouth daily.  . cholecalciferol (VITAMIN D) 1000 UNITS tablet Take 1,000 Units by mouth daily.  . citalopram (CELEXA) 20 MG tablet Take 20 mg by mouth daily.  Marland Kitchen levothyroxine (SYNTHROID) 50 MCG tablet   . omeprazole (PRILOSEC) 20 MG capsule Take 30- 60 min before your first and last meals of the day  . potassium chloride (K-DUR) 10 MEQ tablet Take 10 mEq by mouth daily.  . rosuvastatin (CRESTOR) 10 MG tablet Take 5 mg by mouth See admin instructions. Monday, Wednesday and Friday  . TOVIAZ 8 MG TB24 tablet Take 8 mg by mouth daily.  . traMADol (ULTRAM) 50 MG tablet Take by mouth every 12 (twelve) hours as needed.     Allergies:   Codeine, Bactrim [sulfamethoxazole-trimethoprim], Zestril [lisinopril], and Zocor [simvastatin]   Social History   Socioeconomic History  . Marital status: Widowed    Spouse name: Not on file  . Number of children: 3  . Years of education: Not on file  . Highest education level: Not on file  Occupational History  . 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 Smoking status: Never Smoker  . Smokeless tobacco: Never Used  Substance and Sexual Activity  . Alcohol use: No  . Drug use: No  . Sexual activity: Not on file  Other Topics Concern  . Not on file  Social History Narrative  . Not on file   Social Determinants of Health   Financial Resource Strain:   . Difficulty of Paying Living Expenses:   Food Insecurity:   . Worried About Charity fundraiser in the Last Year:   . Arboriculturist in the Last Year:   Transportation Needs:   . Film/video editor (Medical):   Marland Kitchen Lack of Transportation (Non-Medical):   Physical Activity:   . Days of Exercise per Week:   . Minutes of Exercise per Session:   Stress:   . Feeling  of Stress :   Social Connections:   . Frequency of Communication with Friends and Family:   . Frequency of Social Gatherings with Friends and Family:   . Attends Religious Services:   . Active Member of Clubs or Organizations:   . Attends Archivist Meetings:   Marland Kitchen Marital Status:      Family History: The patient's family history includes Heart disease in her father; Leukemia in her mother.  ROS:   Please see the history of present illness.    All other systems reviewed and are negative.  EKGs/Labs/Other Studies Reviewed:    The following studies were reviewed today: CT ANGIOGRAPHY CHEST WITH CONTRAST  TECHNIQUE: Multidetector CT imaging of the chest was performed using the standard protocol during bolus administration of  intravenous contrast. Multiplanar CT image reconstructions and MIPs were obtained to evaluate the vascular anatomy.  CONTRAST:  63mL ISOVUE-300 IOPAMIDOL (ISOVUE-300) INJECTION 61%  COMPARISON:  03/24/2018  FINDINGS: Cardiovascular: Aortic root at the sinuses of Valsalva measures 4.2 cm and stable. Fusiform aneurysm of the ascending thoracic aorta measures up to 5.2 cm and stable. Distal ascending thoracic aorta measures 4.2 cm and stable. Great vessels are patent but tortuous. Left vertebral artery is dominant. Proximal descending thoracic aorta measures 2.9 cm and stable. Mid descending thoracic aorta is 2.8 cm and stable. Distal descending thoracic aorta measures 2.4 cm and stable. Normal caliber of the proximal abdominal aorta. Celiac trunk and proximal SMA are patent. No significant pericardial effusion. Atherosclerotic calcifications involving the aortic arch and descending thoracic aorta.  Mediastinum/Nodes: Thyroid tissue is heterogeneous without a significant nodule. No significant lymph node enlargement in the abdomen or pelvis. Mediastinal calcifications compatible with old granulomatous disease. No axillary lymph node  enlargement.  Lungs/Pleura: Trachea and mainstem bronchi are patent. No large pleural effusions. Small peripheral nodular structures in the posterior right middle lobe on sequence 7, image 74 are similar to the previous examination. Stable volume loss and scarring at the base of the right middle lobe. Calcified granuloma in the left lower lobe on sequence 7, image 106. No significant airspace disease or consolidation in the lungs.  Upper Abdomen: Again noted is a low-density structure/area along medial aspect of the right kidney which is only partially imaged on this examination. No acute abnormality in the visualized abdomen.  Musculoskeletal: Chronic compression fracture at T12. No acute bone abnormality.  Review of the MIP images confirms the above findings.  IMPRESSION: 1. Stable fusiform aneurysm of the ascending thoracic aorta measuring up to 5.2 cm. No evidence for an aortic dissection. Recommend semi-annual imaging followup by CTA or MRA and referral to cardiothoracic surgery if not already obtained. This recommendation follows 2010 ACCF/AHA/AATS/ACR/ASA/SCA/SCAI/SIR/STS/SVM Guidelines for the Diagnosis and Management of Patients With Thoracic Aortic Disease. Circulation. 2010; 121: [id number]. Aortic aneurysm NOS (ICD10-I71.9) 2. No acute chest abnormality. 3.  Aortic Atherosclerosis (ICD10-I70.0).   Electronically Signed   By: Markus Daft M.D.   On: 01/12/2019 14:08   Recent Labs: 06/29/2018: Magnesium 1.6 07/02/2018: ALT 16; BUN 7; Creatinine, Ser 0.62; Potassium 3.0; Sodium 137 07/03/2018: Hemoglobin 12.1; Platelets 394  Recent Lipid Panel    Component Value Date/Time   CHOL 151 08/25/2017 0910   TRIG 148 08/25/2017 0910   HDL 40 08/25/2017 0910   CHOLHDL 3.8 08/25/2017 0910   LDLCALC 81 08/25/2017 0910    Physical Exam:    VS:  BP 120/74   Pulse 75   Ht 5\' 6"  (1.676 m)   Wt 166 lb (75.3 kg)   SpO2 94%   BMI 26.79 kg/m     Wt Readings from  Last 3 Encounters:  05/10/19 166 lb (75.3 kg)  03/16/19 155 lb (70.3 kg)  01/12/19 155 lb (70.3 kg)     GEN: Patient is in no acute distress HEENT: Normal NECK: No JVD; No carotid bruits LYMPHATICS: No lymphadenopathy CARDIAC: Hear sounds regular, 2/6 systolic murmur at the apex. RESPIRATORY:  Clear to auscultation without rales, wheezing or rhonchi  ABDOMEN: Soft, non-tender, non-distended MUSCULOSKELETAL:  No edema; No deformity  SKIN: Warm and dry NEUROLOGIC:  Alert and oriented x 3 PSYCHIATRIC:  Normal affect   Signed, Jenean Lindau, MD  05/10/2019 3:53 PM    [facility]

## 2019-05-10 NOTE — Patient Instructions (Signed)

## 2019-06-15 DIAGNOSIS — N8111 Cystocele, midline: Secondary | ICD-10-CM | POA: Diagnosis not present

## 2019-07-11 DIAGNOSIS — J40 Bronchitis, not specified as acute or chronic: Secondary | ICD-10-CM | POA: Diagnosis not present

## 2019-07-11 DIAGNOSIS — I10 Essential (primary) hypertension: Secondary | ICD-10-CM | POA: Diagnosis not present

## 2019-07-11 DIAGNOSIS — E7800 Pure hypercholesterolemia, unspecified: Secondary | ICD-10-CM | POA: Diagnosis not present

## 2019-07-11 DIAGNOSIS — G4700 Insomnia, unspecified: Secondary | ICD-10-CM | POA: Diagnosis not present

## 2019-07-11 DIAGNOSIS — J45909 Unspecified asthma, uncomplicated: Secondary | ICD-10-CM | POA: Diagnosis not present

## 2019-08-04 ENCOUNTER — Other Ambulatory Visit: Payer: Self-pay | Admitting: Physician Assistant

## 2019-08-04 ENCOUNTER — Ambulatory Visit
Admission: RE | Admit: 2019-08-04 | Discharge: 2019-08-04 | Disposition: A | Discharge status: Home / Self Care | Payer: Medicare HMO | Source: Ambulatory Visit | Attending: Physician Assistant | Admitting: Physician Assistant

## 2019-08-04 ENCOUNTER — Other Ambulatory Visit: Payer: Self-pay

## 2019-08-04 DIAGNOSIS — R0781 Pleurodynia: Secondary | ICD-10-CM | POA: Diagnosis not present

## 2019-08-04 DIAGNOSIS — S72002A Fracture of unspecified part of neck of left femur, initial encounter for closed fracture: Secondary | ICD-10-CM | POA: Diagnosis not present

## 2019-08-04 DIAGNOSIS — S79922A Unspecified injury of left thigh, initial encounter: Secondary | ICD-10-CM | POA: Diagnosis not present

## 2019-08-04 DIAGNOSIS — M25552 Pain in left hip: Secondary | ICD-10-CM

## 2019-08-04 DIAGNOSIS — W19XXXA Unspecified fall, initial encounter: Secondary | ICD-10-CM | POA: Diagnosis not present

## 2019-08-04 DIAGNOSIS — M25559 Pain in unspecified hip: Secondary | ICD-10-CM

## 2019-08-10 DIAGNOSIS — R351 Nocturia: Secondary | ICD-10-CM | POA: Diagnosis not present

## 2019-08-10 DIAGNOSIS — I10 Essential (primary) hypertension: Secondary | ICD-10-CM | POA: Diagnosis not present

## 2019-08-10 DIAGNOSIS — W19XXXA Unspecified fall, initial encounter: Secondary | ICD-10-CM | POA: Diagnosis not present

## 2019-08-10 DIAGNOSIS — R32 Unspecified urinary incontinence: Secondary | ICD-10-CM | POA: Diagnosis not present

## 2019-08-10 DIAGNOSIS — R0781 Pleurodynia: Secondary | ICD-10-CM | POA: Diagnosis not present

## 2019-08-10 DIAGNOSIS — G4700 Insomnia, unspecified: Secondary | ICD-10-CM | POA: Diagnosis not present

## 2019-08-10 DIAGNOSIS — J40 Bronchitis, not specified as acute or chronic: Secondary | ICD-10-CM | POA: Diagnosis not present

## 2019-08-19 DIAGNOSIS — I10 Essential (primary) hypertension: Secondary | ICD-10-CM | POA: Diagnosis not present

## 2019-08-19 DIAGNOSIS — J309 Allergic rhinitis, unspecified: Secondary | ICD-10-CM | POA: Diagnosis not present

## 2019-08-19 DIAGNOSIS — E669 Obesity, unspecified: Secondary | ICD-10-CM | POA: Diagnosis not present

## 2019-08-19 DIAGNOSIS — R0781 Pleurodynia: Secondary | ICD-10-CM | POA: Diagnosis not present

## 2019-08-19 DIAGNOSIS — M47816 Spondylosis without myelopathy or radiculopathy, lumbar region: Secondary | ICD-10-CM | POA: Diagnosis not present

## 2019-08-19 DIAGNOSIS — E7800 Pure hypercholesterolemia, unspecified: Secondary | ICD-10-CM | POA: Diagnosis not present

## 2019-08-19 DIAGNOSIS — M1990 Unspecified osteoarthritis, unspecified site: Secondary | ICD-10-CM | POA: Diagnosis not present

## 2019-08-19 DIAGNOSIS — G8929 Other chronic pain: Secondary | ICD-10-CM | POA: Diagnosis not present

## 2019-08-19 DIAGNOSIS — J40 Bronchitis, not specified as acute or chronic: Secondary | ICD-10-CM | POA: Diagnosis not present

## 2019-08-23 DIAGNOSIS — E669 Obesity, unspecified: Secondary | ICD-10-CM | POA: Diagnosis not present

## 2019-08-23 DIAGNOSIS — I10 Essential (primary) hypertension: Secondary | ICD-10-CM | POA: Diagnosis not present

## 2019-08-23 DIAGNOSIS — J309 Allergic rhinitis, unspecified: Secondary | ICD-10-CM | POA: Diagnosis not present

## 2019-08-23 DIAGNOSIS — M47816 Spondylosis without myelopathy or radiculopathy, lumbar region: Secondary | ICD-10-CM | POA: Diagnosis not present

## 2019-08-23 DIAGNOSIS — E7800 Pure hypercholesterolemia, unspecified: Secondary | ICD-10-CM | POA: Diagnosis not present

## 2019-08-23 DIAGNOSIS — G8929 Other chronic pain: Secondary | ICD-10-CM | POA: Diagnosis not present

## 2019-08-23 DIAGNOSIS — M1990 Unspecified osteoarthritis, unspecified site: Secondary | ICD-10-CM | POA: Diagnosis not present

## 2019-08-23 DIAGNOSIS — R0781 Pleurodynia: Secondary | ICD-10-CM | POA: Diagnosis not present

## 2019-08-23 DIAGNOSIS — J40 Bronchitis, not specified as acute or chronic: Secondary | ICD-10-CM | POA: Diagnosis not present

## 2019-08-25 DIAGNOSIS — G8929 Other chronic pain: Secondary | ICD-10-CM | POA: Diagnosis not present

## 2019-08-25 DIAGNOSIS — M47816 Spondylosis without myelopathy or radiculopathy, lumbar region: Secondary | ICD-10-CM | POA: Diagnosis not present

## 2019-08-25 DIAGNOSIS — R0781 Pleurodynia: Secondary | ICD-10-CM | POA: Diagnosis not present

## 2019-08-25 DIAGNOSIS — M1990 Unspecified osteoarthritis, unspecified site: Secondary | ICD-10-CM | POA: Diagnosis not present

## 2019-08-25 DIAGNOSIS — I10 Essential (primary) hypertension: Secondary | ICD-10-CM | POA: Diagnosis not present

## 2019-08-25 DIAGNOSIS — E7800 Pure hypercholesterolemia, unspecified: Secondary | ICD-10-CM | POA: Diagnosis not present

## 2019-08-25 DIAGNOSIS — J40 Bronchitis, not specified as acute or chronic: Secondary | ICD-10-CM | POA: Diagnosis not present

## 2019-08-25 DIAGNOSIS — E669 Obesity, unspecified: Secondary | ICD-10-CM | POA: Diagnosis not present

## 2019-08-25 DIAGNOSIS — J309 Allergic rhinitis, unspecified: Secondary | ICD-10-CM | POA: Diagnosis not present

## 2019-08-26 DIAGNOSIS — J40 Bronchitis, not specified as acute or chronic: Secondary | ICD-10-CM | POA: Diagnosis not present

## 2019-08-26 DIAGNOSIS — R0781 Pleurodynia: Secondary | ICD-10-CM | POA: Diagnosis not present

## 2019-08-26 DIAGNOSIS — I10 Essential (primary) hypertension: Secondary | ICD-10-CM | POA: Diagnosis not present

## 2019-08-26 DIAGNOSIS — J309 Allergic rhinitis, unspecified: Secondary | ICD-10-CM | POA: Diagnosis not present

## 2019-08-26 DIAGNOSIS — E669 Obesity, unspecified: Secondary | ICD-10-CM | POA: Diagnosis not present

## 2019-08-26 DIAGNOSIS — M47816 Spondylosis without myelopathy or radiculopathy, lumbar region: Secondary | ICD-10-CM | POA: Diagnosis not present

## 2019-08-26 DIAGNOSIS — G8929 Other chronic pain: Secondary | ICD-10-CM | POA: Diagnosis not present

## 2019-08-26 DIAGNOSIS — M1990 Unspecified osteoarthritis, unspecified site: Secondary | ICD-10-CM | POA: Diagnosis not present

## 2019-08-26 DIAGNOSIS — E7800 Pure hypercholesterolemia, unspecified: Secondary | ICD-10-CM | POA: Diagnosis not present

## 2019-08-30 DIAGNOSIS — G8929 Other chronic pain: Secondary | ICD-10-CM | POA: Diagnosis not present

## 2019-08-30 DIAGNOSIS — M1990 Unspecified osteoarthritis, unspecified site: Secondary | ICD-10-CM | POA: Diagnosis not present

## 2019-08-30 DIAGNOSIS — R0781 Pleurodynia: Secondary | ICD-10-CM | POA: Diagnosis not present

## 2019-08-30 DIAGNOSIS — M47816 Spondylosis without myelopathy or radiculopathy, lumbar region: Secondary | ICD-10-CM | POA: Diagnosis not present

## 2019-08-30 DIAGNOSIS — E669 Obesity, unspecified: Secondary | ICD-10-CM | POA: Diagnosis not present

## 2019-08-30 DIAGNOSIS — E7800 Pure hypercholesterolemia, unspecified: Secondary | ICD-10-CM | POA: Diagnosis not present

## 2019-08-30 DIAGNOSIS — J309 Allergic rhinitis, unspecified: Secondary | ICD-10-CM | POA: Diagnosis not present

## 2019-08-30 DIAGNOSIS — I10 Essential (primary) hypertension: Secondary | ICD-10-CM | POA: Diagnosis not present

## 2019-08-30 DIAGNOSIS — J40 Bronchitis, not specified as acute or chronic: Secondary | ICD-10-CM | POA: Diagnosis not present

## 2019-09-01 DIAGNOSIS — J309 Allergic rhinitis, unspecified: Secondary | ICD-10-CM | POA: Diagnosis not present

## 2019-09-01 DIAGNOSIS — E669 Obesity, unspecified: Secondary | ICD-10-CM | POA: Diagnosis not present

## 2019-09-01 DIAGNOSIS — J40 Bronchitis, not specified as acute or chronic: Secondary | ICD-10-CM | POA: Diagnosis not present

## 2019-09-01 DIAGNOSIS — E7800 Pure hypercholesterolemia, unspecified: Secondary | ICD-10-CM | POA: Diagnosis not present

## 2019-09-01 DIAGNOSIS — I10 Essential (primary) hypertension: Secondary | ICD-10-CM | POA: Diagnosis not present

## 2019-09-01 DIAGNOSIS — R0781 Pleurodynia: Secondary | ICD-10-CM | POA: Diagnosis not present

## 2019-09-01 DIAGNOSIS — M1990 Unspecified osteoarthritis, unspecified site: Secondary | ICD-10-CM | POA: Diagnosis not present

## 2019-09-01 DIAGNOSIS — M47816 Spondylosis without myelopathy or radiculopathy, lumbar region: Secondary | ICD-10-CM | POA: Diagnosis not present

## 2019-09-01 DIAGNOSIS — G8929 Other chronic pain: Secondary | ICD-10-CM | POA: Diagnosis not present

## 2019-09-06 DIAGNOSIS — G8929 Other chronic pain: Secondary | ICD-10-CM | POA: Diagnosis not present

## 2019-09-06 DIAGNOSIS — R0781 Pleurodynia: Secondary | ICD-10-CM | POA: Diagnosis not present

## 2019-09-06 DIAGNOSIS — J309 Allergic rhinitis, unspecified: Secondary | ICD-10-CM | POA: Diagnosis not present

## 2019-09-06 DIAGNOSIS — E7800 Pure hypercholesterolemia, unspecified: Secondary | ICD-10-CM | POA: Diagnosis not present

## 2019-09-06 DIAGNOSIS — M47816 Spondylosis without myelopathy or radiculopathy, lumbar region: Secondary | ICD-10-CM | POA: Diagnosis not present

## 2019-09-06 DIAGNOSIS — E669 Obesity, unspecified: Secondary | ICD-10-CM | POA: Diagnosis not present

## 2019-09-06 DIAGNOSIS — J40 Bronchitis, not specified as acute or chronic: Secondary | ICD-10-CM | POA: Diagnosis not present

## 2019-09-06 DIAGNOSIS — M1990 Unspecified osteoarthritis, unspecified site: Secondary | ICD-10-CM | POA: Diagnosis not present

## 2019-09-06 DIAGNOSIS — I10 Essential (primary) hypertension: Secondary | ICD-10-CM | POA: Diagnosis not present

## 2019-09-08 DIAGNOSIS — E7800 Pure hypercholesterolemia, unspecified: Secondary | ICD-10-CM | POA: Diagnosis not present

## 2019-09-08 DIAGNOSIS — J309 Allergic rhinitis, unspecified: Secondary | ICD-10-CM | POA: Diagnosis not present

## 2019-09-08 DIAGNOSIS — G8929 Other chronic pain: Secondary | ICD-10-CM | POA: Diagnosis not present

## 2019-09-08 DIAGNOSIS — M1990 Unspecified osteoarthritis, unspecified site: Secondary | ICD-10-CM | POA: Diagnosis not present

## 2019-09-08 DIAGNOSIS — J40 Bronchitis, not specified as acute or chronic: Secondary | ICD-10-CM | POA: Diagnosis not present

## 2019-09-08 DIAGNOSIS — I10 Essential (primary) hypertension: Secondary | ICD-10-CM | POA: Diagnosis not present

## 2019-09-08 DIAGNOSIS — R0781 Pleurodynia: Secondary | ICD-10-CM | POA: Diagnosis not present

## 2019-09-08 DIAGNOSIS — E669 Obesity, unspecified: Secondary | ICD-10-CM | POA: Diagnosis not present

## 2019-09-08 DIAGNOSIS — M47816 Spondylosis without myelopathy or radiculopathy, lumbar region: Secondary | ICD-10-CM | POA: Diagnosis not present

## 2019-09-13 DIAGNOSIS — M1990 Unspecified osteoarthritis, unspecified site: Secondary | ICD-10-CM | POA: Diagnosis not present

## 2019-09-13 DIAGNOSIS — M47816 Spondylosis without myelopathy or radiculopathy, lumbar region: Secondary | ICD-10-CM | POA: Diagnosis not present

## 2019-09-13 DIAGNOSIS — E7800 Pure hypercholesterolemia, unspecified: Secondary | ICD-10-CM | POA: Diagnosis not present

## 2019-09-13 DIAGNOSIS — J40 Bronchitis, not specified as acute or chronic: Secondary | ICD-10-CM | POA: Diagnosis not present

## 2019-09-13 DIAGNOSIS — J309 Allergic rhinitis, unspecified: Secondary | ICD-10-CM | POA: Diagnosis not present

## 2019-09-13 DIAGNOSIS — I10 Essential (primary) hypertension: Secondary | ICD-10-CM | POA: Diagnosis not present

## 2019-09-13 DIAGNOSIS — E669 Obesity, unspecified: Secondary | ICD-10-CM | POA: Diagnosis not present

## 2019-09-13 DIAGNOSIS — G8929 Other chronic pain: Secondary | ICD-10-CM | POA: Diagnosis not present

## 2019-09-13 DIAGNOSIS — R0781 Pleurodynia: Secondary | ICD-10-CM | POA: Diagnosis not present

## 2019-09-18 DIAGNOSIS — E669 Obesity, unspecified: Secondary | ICD-10-CM | POA: Diagnosis not present

## 2019-09-18 DIAGNOSIS — M1990 Unspecified osteoarthritis, unspecified site: Secondary | ICD-10-CM | POA: Diagnosis not present

## 2019-09-18 DIAGNOSIS — R0781 Pleurodynia: Secondary | ICD-10-CM | POA: Diagnosis not present

## 2019-09-18 DIAGNOSIS — J309 Allergic rhinitis, unspecified: Secondary | ICD-10-CM | POA: Diagnosis not present

## 2019-09-18 DIAGNOSIS — J40 Bronchitis, not specified as acute or chronic: Secondary | ICD-10-CM | POA: Diagnosis not present

## 2019-09-18 DIAGNOSIS — E7800 Pure hypercholesterolemia, unspecified: Secondary | ICD-10-CM | POA: Diagnosis not present

## 2019-09-18 DIAGNOSIS — G8929 Other chronic pain: Secondary | ICD-10-CM | POA: Diagnosis not present

## 2019-09-18 DIAGNOSIS — I10 Essential (primary) hypertension: Secondary | ICD-10-CM | POA: Diagnosis not present

## 2019-09-18 DIAGNOSIS — M47816 Spondylosis without myelopathy or radiculopathy, lumbar region: Secondary | ICD-10-CM | POA: Diagnosis not present

## 2019-09-20 DIAGNOSIS — E7800 Pure hypercholesterolemia, unspecified: Secondary | ICD-10-CM | POA: Diagnosis not present

## 2019-09-20 DIAGNOSIS — J40 Bronchitis, not specified as acute or chronic: Secondary | ICD-10-CM | POA: Diagnosis not present

## 2019-09-20 DIAGNOSIS — J309 Allergic rhinitis, unspecified: Secondary | ICD-10-CM | POA: Diagnosis not present

## 2019-09-20 DIAGNOSIS — M1990 Unspecified osteoarthritis, unspecified site: Secondary | ICD-10-CM | POA: Diagnosis not present

## 2019-09-20 DIAGNOSIS — E669 Obesity, unspecified: Secondary | ICD-10-CM | POA: Diagnosis not present

## 2019-09-20 DIAGNOSIS — I10 Essential (primary) hypertension: Secondary | ICD-10-CM | POA: Diagnosis not present

## 2019-09-20 DIAGNOSIS — M47816 Spondylosis without myelopathy or radiculopathy, lumbar region: Secondary | ICD-10-CM | POA: Diagnosis not present

## 2019-09-20 DIAGNOSIS — R0781 Pleurodynia: Secondary | ICD-10-CM | POA: Diagnosis not present

## 2019-09-20 DIAGNOSIS — G8929 Other chronic pain: Secondary | ICD-10-CM | POA: Diagnosis not present

## 2019-09-23 DIAGNOSIS — K5900 Constipation, unspecified: Secondary | ICD-10-CM | POA: Diagnosis not present

## 2019-09-23 DIAGNOSIS — R197 Diarrhea, unspecified: Secondary | ICD-10-CM | POA: Diagnosis not present

## 2019-09-23 DIAGNOSIS — K219 Gastro-esophageal reflux disease without esophagitis: Secondary | ICD-10-CM | POA: Diagnosis not present

## 2019-09-27 DIAGNOSIS — J309 Allergic rhinitis, unspecified: Secondary | ICD-10-CM | POA: Diagnosis not present

## 2019-09-27 DIAGNOSIS — R0781 Pleurodynia: Secondary | ICD-10-CM | POA: Diagnosis not present

## 2019-09-27 DIAGNOSIS — M47816 Spondylosis without myelopathy or radiculopathy, lumbar region: Secondary | ICD-10-CM | POA: Diagnosis not present

## 2019-09-27 DIAGNOSIS — J40 Bronchitis, not specified as acute or chronic: Secondary | ICD-10-CM | POA: Diagnosis not present

## 2019-09-27 DIAGNOSIS — I10 Essential (primary) hypertension: Secondary | ICD-10-CM | POA: Diagnosis not present

## 2019-09-27 DIAGNOSIS — M1990 Unspecified osteoarthritis, unspecified site: Secondary | ICD-10-CM | POA: Diagnosis not present

## 2019-09-27 DIAGNOSIS — E7800 Pure hypercholesterolemia, unspecified: Secondary | ICD-10-CM | POA: Diagnosis not present

## 2019-09-27 DIAGNOSIS — G8929 Other chronic pain: Secondary | ICD-10-CM | POA: Diagnosis not present

## 2019-09-27 DIAGNOSIS — E669 Obesity, unspecified: Secondary | ICD-10-CM | POA: Diagnosis not present

## 2019-10-12 DIAGNOSIS — N8111 Cystocele, midline: Secondary | ICD-10-CM | POA: Diagnosis not present

## 2019-10-20 DIAGNOSIS — I10 Essential (primary) hypertension: Secondary | ICD-10-CM | POA: Diagnosis not present

## 2019-10-20 DIAGNOSIS — J45909 Unspecified asthma, uncomplicated: Secondary | ICD-10-CM | POA: Diagnosis not present

## 2019-10-20 DIAGNOSIS — G4700 Insomnia, unspecified: Secondary | ICD-10-CM | POA: Diagnosis not present

## 2019-10-20 DIAGNOSIS — E7800 Pure hypercholesterolemia, unspecified: Secondary | ICD-10-CM | POA: Diagnosis not present

## 2019-10-20 DIAGNOSIS — J40 Bronchitis, not specified as acute or chronic: Secondary | ICD-10-CM | POA: Diagnosis not present

## 2019-11-03 DIAGNOSIS — X32XXXS Exposure to sunlight, sequela: Secondary | ICD-10-CM | POA: Diagnosis not present

## 2019-11-03 DIAGNOSIS — L570 Actinic keratosis: Secondary | ICD-10-CM | POA: Diagnosis not present

## 2019-11-03 DIAGNOSIS — L820 Inflamed seborrheic keratosis: Secondary | ICD-10-CM | POA: Diagnosis not present

## 2019-11-03 DIAGNOSIS — Z85828 Personal history of other malignant neoplasm of skin: Secondary | ICD-10-CM | POA: Diagnosis not present

## 2019-11-03 DIAGNOSIS — L814 Other melanin hyperpigmentation: Secondary | ICD-10-CM | POA: Diagnosis not present

## 2019-11-16 ENCOUNTER — Other Ambulatory Visit: Payer: Self-pay

## 2019-11-16 ENCOUNTER — Ambulatory Visit: Payer: Medicare HMO | Admitting: Cardiology

## 2019-11-16 ENCOUNTER — Encounter: Payer: Self-pay | Admitting: Cardiology

## 2019-11-16 VITALS — BP 124/80 | HR 80 | Ht 66.0 in | Wt 159.0 lb

## 2019-11-16 DIAGNOSIS — I10 Essential (primary) hypertension: Secondary | ICD-10-CM

## 2019-11-16 DIAGNOSIS — I77810 Thoracic aortic ectasia: Secondary | ICD-10-CM | POA: Diagnosis not present

## 2019-11-16 DIAGNOSIS — E782 Mixed hyperlipidemia: Secondary | ICD-10-CM | POA: Diagnosis not present

## 2019-11-16 DIAGNOSIS — I351 Nonrheumatic aortic (valve) insufficiency: Secondary | ICD-10-CM | POA: Diagnosis not present

## 2019-11-16 NOTE — Patient Instructions (Signed)
Medication Instructions:  No medication changes. *If you need a refill on your cardiac medications before your next appointment, please call your pharmacy*   Lab Work: None ordered If you have labs (blood work) drawn today and your tests are completely normal, you will receive your results only by: Marland Kitchen MyChart Message (if you have MyChart) OR . A paper copy in the mail If you have any lab test that is abnormal or we need to change your treatment, we will call you to review the results.   Testing/Procedures: None ordered   Follow-Up: At [facility], you and your health needs are our priority.  As part of our continuing mission to provide you with exceptional heart care, we have created designated Provider Care Teams.  These Care Teams include your primary Cardiologist (physician) and Advanced Practice Providers (APPs -  Physician Assistants and Nurse Practitioners) who all work together to provide you with the care you need, when you need it.  We recommend signing up for the patient portal called "MyChart".  Sign up information is provided on this After Visit Summary.  MyChart is used to connect with patients for Virtual Visits (Telemedicine).  Patients are able to view lab/test results, encounter notes, upcoming appointments, etc.  Non-urgent messages can be sent to your provider as well.   To learn more about what you can do with MyChart, go to [email or web address].    Your next appointment:   6 month(s)  The format for your next appointment:   In Person  Provider:   Jyl Heinz, MD   Other Instructions  CT Scan  A CT scan is a kind of X-ray. A CT scan makes pictures of the inside of your body. In this procedure, the pictures will be taken in a large machine that has an opening (CT scanner). What happens before the procedure? Staying hydrated Follow instructions from your doctor about hydration, which may include:  Up to 2 hours before the procedure - you may  continue to drink clear liquids. These include water, clear fruit juice, black coffee, and plain tea. Eating and drinking restrictions Follow instructions from your doctor about eating and drinking, which may include:  24 hours before the procedure - stop drinking caffeinated drinks. These include energy drinks, tea, soda, coffee, and hot chocolate.  8 hours before the procedure - stop eating heavy meals or foods. These include meat, fried foods, or fatty foods.  6 hours before the procedure - stop eating light meals or foods. These include toast or cereal.  6 hours before the procedure - stop drinking milk or drinks that have milk in them.  2 hours before the procedure - stop drinking clear liquids. General instructions  Take off any jewelry.  Ask your doctor about changing or stopping your normal medicines. This is important if you take diabetes medicines or blood thinners. What happens during the procedure?  You will lie on a table with your arms above your head.  An IV tube may be put into one of your veins.  Dye may be put into the IV tube. You may feel warm or have a metal taste in your mouth.  The table you will be lying on will move into the CT scanner.  You will be able to see, hear, and talk to the person who is running the machine while you are in it. Follow that person's directions.  The machine will move around you to take pictures. Do not move.  When the machine is  done taking pictures, it will be turned off.  The table will be moved out of the machine.  Your IV tube will be taken out. The procedure may vary among doctors and hospitals. What happens after the procedure?  It is up to you to get your results. Ask when your results will be ready. Summary  A CT scan is a kind of X-ray.  A CT scan makes pictures of the inside of your body.  Follow instructions from your doctor about eating and drinking before the procedure.  You will be able to see, hear, and  talk to the person who is running the machine while you are in it. Follow that person's directions. This information is not intended to replace advice given to you by your health care provider. Make sure you discuss any questions you have with your health care provider. Document Revised: 02/28/2016 Document Reviewed: 02/28/2016 Elsevier Patient Education  [place].

## 2019-11-16 NOTE — Progress Notes (Signed)
Cardiology Office Note:    Date:  11/16/2019   ID:  LEOCADIA IDLEMAN, DOB [birth year], MRN [id number]  PCP:  Josetta Huddle, MD  Cardiologist:  Jenean Lindau, MD   Referring MD: Josetta Huddle, MD    ASSESSMENT:    1. Essential hypertension   2. Ascending aorta dilation (HCC)   3. Aortic valve insufficiency, etiology of cardiac valve disease unspecified   4. Mixed dyslipidemia    PLAN:    In order of problems listed above:  1. Primary prevention stressed with the patient.  Importance of compliance with diet medication stressed and she vocalized understanding. 2. Ascending aortic aneurysm: Stable at this time.  I reviewed records by cardiothoracic surgery and discussed with the patient and daughter at length.  They vocalized understanding and questions were answered to her satisfaction.  We will do a follow-up CT scan to assess this and progress of it.  Patient and family are keen on this information. 3. Aortic regurgitation: Stable at this time.  Echocardiogram reviewed. 4. Mixed dyslipidemia: On statin therapy.  Lipids followed by primary care physician. 5. Patient will be seen in follow-up appointment in 6 months or earlier if the patient has any concerns    Medication Adjustments/Labs and Tests Ordered: Current medicines are reviewed at length with the patient today.  Concerns regarding medicines are outlined above.  No orders of the defined types were placed in this encounter.  No orders of the defined types were placed in this encounter.    No chief complaint on file.    History of Present Illness:    Latoya Curry is a 86 y.o. female.  Patient has past medical history of ascending aortic aneurysm, aortic regurgitation and mixed dyslipidemia.  She denies any problems at this time and takes care of activities of daily living.  No chest pain orthopnea or PND.  At the time of my evaluation, the patient is alert awake oriented and in no distress.  Patient's daughter  accompanies her for this visit.  Past Medical History:  Diagnosis Date  . Acute gangrenous appendicitis s/p lap appendectomy 06/24/2018 06/23/2018  . Aortic root enlargement ([place]) 08/19/2017  . Ascending aorta dilation ([place]) 08/19/2017  . Chest discomfort 08/05/2017  . Chronic low back pain   . Cough 09/23/2011   Followed in Pulmonary clinic/ [facility]/ [place]   - Sinus CT 09/25/2011 > neg   . Diverticulitis   . DJD (degenerative joint disease) of knee   . Essential hypertension 08/05/2017  . Hypercholesteremia   . Hypertension   . Hypokalemia 06/24/2018  . Incarcerated incisional hernia s/p primary repair 06/24/2018 06/24/2018  . Mixed dyslipidemia 08/05/2017  . Neurodermatitis   . Obesity   . Osteopenia   . Pain in left hip 03/16/2019  . PVC (premature ventricular contraction) 08/05/2017  . Recurrent UTI   . Small bowel obstruction, partial, s/p lap adhesiolysis 06/24/2018 06/24/2018  . Thoracic aortic aneurysm ([place])   . Transient global amnesia     Past Surgical History:  Procedure Laterality Date  . COLON RESECTION SIGMOID  2004   Dr Dalbert Batman  . EXPLORATORY LAPAROTOMY  2004   r/o pancreas mass.  Dr Dalbert Batman  . IR RADIOLOGIST EVAL & MGMT  07/14/2018  . LAPAROSCOPIC APPENDECTOMY N/A 06/24/2018   Procedure: ACUTE GANGRENOUS APPENDICITIS WITH ABSCESS, PARTIAL SMALL BOWEL OBSTRUCTION, INCISIONAL HERNIA REPAIR, LYSIS OF ADHESIONS;  Surgeon: Michael Boston, MD;  Location: [facility];  Service: General;  Laterality: N/A;  . TOTAL KNEE ARTHROPLASTY  bilaterallly    Current Medications: Current Meds  Medication Sig  . amLODipine (NORVASC) 2.5 MG tablet Take 2.5 mg by mouth daily.  Marland Kitchen aspirin 81 MG tablet Take 81 mg by mouth See admin instructions. Monday, Wednesday and Friday  . atenolol-chlorthalidone (TENORETIC) 50-25 MG per tablet Take 1 tablet by mouth daily.  . cetirizine (ZYRTEC) 10 MG tablet Take 10 mg by mouth daily.  . cholecalciferol (VITAMIN D) 1000 UNITS tablet Take 1,000 Units  by mouth daily.  . citalopram (CELEXA) 20 MG tablet Take 20 mg by mouth daily.  Marland Kitchen levothyroxine (SYNTHROID) 50 MCG tablet Take 50 mcg by mouth daily before breakfast.   . omeprazole (PRILOSEC) 20 MG capsule Take 30- 60 min before your first and last meals of the day  . potassium chloride (K-DUR) 10 MEQ tablet Take 10 mEq by mouth daily.  . rosuvastatin (CRESTOR) 5 MG tablet Take 5 mg by mouth daily.  . TOVIAZ 8 MG TB24 tablet Take 8 mg by mouth daily.  . traMADol (ULTRAM) 50 MG tablet Take by mouth every 12 (twelve) hours as needed.     Allergies:   Codeine, Bactrim [sulfamethoxazole-trimethoprim], Zestril [lisinopril], and Zocor [simvastatin]   Social History   Socioeconomic History  . Marital status: Widowed    Spouse name: Not on file  . Number of children: 3  . Years of education: Not on file  . Highest education level: Not on file  Occupational History  . 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 Smoking status: Never Smoker  . Smokeless tobacco: Never Used  Vaping Use  . Vaping Use: Never used  Substance and Sexual Activity  . Alcohol use: No  . Drug use: No  . Sexual activity: Not on file  Other Topics Concern  . Not on file  Social History Narrative  . Not on file   Social Determinants of Health   Financial Resource Strain:   . Difficulty of Paying Living Expenses: Not on file  Food Insecurity:   . Worried About Charity fundraiser in the Last Year: Not on file  . Ran Out of Food in the Last Year: Not on file  Transportation Needs:   . Lack of Transportation (Medical): Not on file  . Lack of Transportation (Non-Medical): Not on file  Physical Activity:   . Days of Exercise per Week: Not on file  . Minutes of Exercise per Session: Not on file  Stress:   . Feeling of Stress : Not on file  Social Connections:   . Frequency of Communication with Friends and Family: Not on file  . Frequency of Social Gatherings with Friends and  Family: Not on file  . Attends Religious Services: Not on file  . Active Member of Clubs or Organizations: Not on file  . Attends Archivist Meetings: Not on file  . Marital Status: Not on file     Family History: The patient's family history includes Heart disease in her father; Leukemia in her mother.  ROS:   Please see the history of present illness.    All other systems reviewed and are negative.  EKGs/Labs/Other Studies Reviewed:    The following studies were reviewed today: CT scan report reveals ascending aorta to be to be 5.2 cm   Recent Labs: No results found for requested labs within last 8760 hours.  Recent Lipid Panel    Component Value Date/Time   CHOL 151 08/25/2017 0910  TRIG 148 08/25/2017 0910   HDL 40 08/25/2017 0910   CHOLHDL 3.8 08/25/2017 0910   LDLCALC 81 08/25/2017 0910    Physical Exam:    VS:  BP 124/80   Pulse 80   Ht 5\' 6"  (1.676 m)   Wt 159 lb (72.1 kg)   SpO2 95%   BMI 25.66 kg/m     Wt Readings from Last 3 Encounters:  11/16/19 159 lb (72.1 kg)  05/10/19 166 lb (75.3 kg)  03/16/19 155 lb (70.3 kg)     GEN: Patient is in no acute distress HEENT: Normal NECK: No JVD; No carotid bruits LYMPHATICS: No lymphadenopathy CARDIAC: Hear sounds regular, 2/6 systolic murmur at the apex. RESPIRATORY:  Clear to auscultation without rales, wheezing or rhonchi  ABDOMEN: Soft, non-tender, non-distended MUSCULOSKELETAL:  No edema; No deformity  SKIN: Warm and dry NEUROLOGIC:  Alert and oriented x 3 PSYCHIATRIC:  Normal affect   Signed, Jenean Lindau, MD  11/16/2019 2:35 PM    [facility]

## 2019-11-22 ENCOUNTER — Other Ambulatory Visit (HOSPITAL_BASED_OUTPATIENT_CLINIC_OR_DEPARTMENT_OTHER): Payer: Medicare HMO

## 2019-12-23 ENCOUNTER — Other Ambulatory Visit: Payer: Self-pay | Admitting: Cardiothoracic Surgery

## 2019-12-23 DIAGNOSIS — I712 Thoracic aortic aneurysm, without rupture, unspecified: Secondary | ICD-10-CM

## 2020-01-02 ENCOUNTER — Other Ambulatory Visit: Payer: Medicare HMO

## 2020-01-05 ENCOUNTER — Ambulatory Visit
Admission: RE | Admit: 2020-01-05 | Discharge: 2020-01-05 | Disposition: A | Discharge status: Home / Self Care | Payer: Medicare HMO | Source: Ambulatory Visit | Attending: Cardiothoracic Surgery | Admitting: Cardiothoracic Surgery

## 2020-01-05 DIAGNOSIS — I712 Thoracic aortic aneurysm, without rupture, unspecified: Secondary | ICD-10-CM

## 2020-01-05 MED ORDER — IOPAMIDOL (ISOVUE-370) INJECTION 76%
75.0000 mL | Freq: Once | INTRAVENOUS | Status: AC | PRN
  Administered 2020-01-05: 75 mL via INTRAVENOUS

## 2020-01-09 DIAGNOSIS — G4700 Insomnia, unspecified: Secondary | ICD-10-CM | POA: Diagnosis not present

## 2020-01-09 DIAGNOSIS — J45909 Unspecified asthma, uncomplicated: Secondary | ICD-10-CM | POA: Diagnosis not present

## 2020-01-09 DIAGNOSIS — K219 Gastro-esophageal reflux disease without esophagitis: Secondary | ICD-10-CM | POA: Diagnosis not present

## 2020-01-09 DIAGNOSIS — J40 Bronchitis, not specified as acute or chronic: Secondary | ICD-10-CM | POA: Diagnosis not present

## 2020-01-09 DIAGNOSIS — E7800 Pure hypercholesterolemia, unspecified: Secondary | ICD-10-CM | POA: Diagnosis not present

## 2020-01-09 DIAGNOSIS — I10 Essential (primary) hypertension: Secondary | ICD-10-CM | POA: Diagnosis not present

## 2020-01-11 ENCOUNTER — Other Ambulatory Visit: Payer: Self-pay

## 2020-01-11 ENCOUNTER — Ambulatory Visit: Payer: Medicare HMO | Admitting: Cardiothoracic Surgery

## 2020-01-11 ENCOUNTER — Encounter: Payer: Self-pay | Admitting: Cardiothoracic Surgery

## 2020-01-11 VITALS — BP 139/85 | HR 72 | Resp 20 | Ht 66.0 in | Wt 159.2 lb

## 2020-01-11 DIAGNOSIS — I77810 Thoracic aortic ectasia: Secondary | ICD-10-CM | POA: Diagnosis not present

## 2020-01-11 DIAGNOSIS — I700 Atherosclerosis of aorta: Secondary | ICD-10-CM

## 2020-01-11 HISTORY — DX: Atherosclerosis of aorta: I70.0

## 2020-01-11 NOTE — Progress Notes (Signed)
PCP is Josetta Huddle, MD Referring Provider is Josetta Huddle, MD  Chief Complaint  Patient presents with  . Thoracic Aortic Aneurysm    f/u with CTA chest     HPI: Patient turns for annual exam with CTA for a stable 5.3 cm asymptomatic fusiform ascending aneurysm first noted in 2019 when she had an event during a dental procedure which led to the CT scan.  He remains asymptomatic.  I reviewed the images of today's scan which show minimal change from her study a year ago.  The AP diameter on the sagittal orientation is 5.3 cm.  There is some mild mural thickening but no ulceration.  The patient has mild hypertension and is on Norvasc 2.5 mg daily.  Patient understands that at age 58 living in a assisted living facility [facility]) with a significant comorbid medical problems she is not a candidate for resection and grafting of the ascending aorta and aortic valve replacement.  She understands that the best treatment is blood pressure control.  The risk for tear or dissection of the aorta remains at approximately  6-8% /year.  She understands that the annual scans are performed to help prognosticate the risk of tear which hopefully will not occur near future.   Past Medical History:  Diagnosis Date  . Acute gangrenous appendicitis s/p lap appendectomy 06/24/2018 06/23/2018  . Aortic root enlargement ([place]) 08/19/2017  . Ascending aorta dilation ([place]) 08/19/2017  . Chest discomfort 08/05/2017  . Chronic low back pain   . Cough 09/23/2011   Followed in Pulmonary clinic/ [facility]/ [facility]   - Sinus CT 09/25/2011 > neg   . Diverticulitis   . DJD (degenerative joint disease) of knee   . Essential hypertension 08/05/2017  . Hypercholesteremia   . Hypertension   . Hypokalemia 06/24/2018  . Incarcerated incisional hernia s/p primary repair 06/24/2018 06/24/2018  . Mixed dyslipidemia 08/05/2017  . Neurodermatitis   . Obesity   . Osteopenia   . Pain in left hip 03/16/2019  . PVC (premature  ventricular contraction) 08/05/2017  . Recurrent UTI   . Small bowel obstruction, partial, s/p lap adhesiolysis 06/24/2018 06/24/2018  . Thoracic aortic aneurysm ([place])   . Transient global amnesia     Past Surgical History:  Procedure Laterality Date  . COLON RESECTION SIGMOID  2004   Dr Dalbert Batman  . EXPLORATORY LAPAROTOMY  2004   r/o pancreas mass.  Dr Dalbert Batman  . IR RADIOLOGIST EVAL & MGMT  07/14/2018  . LAPAROSCOPIC APPENDECTOMY N/A 06/24/2018   Procedure: ACUTE GANGRENOUS APPENDICITIS WITH ABSCESS, PARTIAL SMALL BOWEL OBSTRUCTION, INCISIONAL HERNIA REPAIR, LYSIS OF ADHESIONS;  Surgeon: Michael Boston, MD;  Location: [facility];  Service: General;  Laterality: N/A;  . TOTAL KNEE ARTHROPLASTY     bilaterallly    Family History  Problem Relation Age of Onset  . Heart disease Father   . Leukemia Mother     Social History Social History   Tobacco Use  . Smoking status: Never Smoker  . Smokeless tobacco: Never Used  Vaping Use  . Vaping Use: Never used  Substance Use Topics  . Alcohol use: No  . Drug use: No    Current Outpatient Medications  Medication Sig Dispense Refill  . amLODipine (NORVASC) 2.5 MG tablet Take 2.5 mg by mouth daily.    Marland Kitchen aspirin 81 MG tablet Take 81 mg by mouth See admin instructions. Monday, Wednesday and Friday    . atenolol-chlorthalidone (TENORETIC) 50-25 MG per tablet Take 1 tablet by mouth  daily.    . cetirizine (ZYRTEC) 10 MG tablet Take 10 mg by mouth daily.    . cholecalciferol (VITAMIN D) 1000 UNITS tablet Take 1,000 Units by mouth daily.    . citalopram (CELEXA) 20 MG tablet Take 20 mg by mouth daily.    Marland Kitchen levothyroxine (SYNTHROID) 50 MCG tablet Take 50 mcg by mouth daily before breakfast.     . omeprazole (PRILOSEC) 20 MG capsule Take 30- 60 min before your first and last meals of the day    . potassium chloride (K-DUR) 10 MEQ tablet Take 10 mEq by mouth daily.    . rosuvastatin (CRESTOR) 5 MG tablet Take 5 mg by mouth daily.    . TOVIAZ 8 MG TB24  tablet Take 8 mg by mouth daily.    . traMADol (ULTRAM) 50 MG tablet Take by mouth every 12 (twelve) hours as needed.     No current facility-administered medications for this visit.    Allergies  Allergen Reactions  . Codeine Nausea And Vomiting  . Bactrim [Sulfamethoxazole-Trimethoprim] Other (See Comments)    unknown  . Zestril [Lisinopril] Cough  . Zocor [Simvastatin] Other (See Comments)    unknown    Review of Systems  Weight stable No pulmonary problems of cough congestion shortness of breath No chest pain Stable Has received influenza and COVID-19 vaccinations  BP 139/85 (BP Location: Left Arm, Patient Position: Sitting, Cuff Size: Normal)   Pulse 72   Resp 20   Ht 5\' 6"  (1.676 m)   Wt 159 lb 3.2 oz (72.2 kg)   SpO2 93% Comment: RA  BMI 25.70 kg/m  Physical Exam      Exam    General- alert and comfortable, elderly kyphotic frail-appearing female no distress    Neck- no JVD, no cervical adenopathy palpable, no carotid bruit   Lungs- clear without rales, wheezes   Cor- regular rate and rhythm, no murmur , gallop   Abdomen- soft, non-tender   Extremities - warm, non-tender, minimal edema   Neuro- oriented, appropriate, no focal weakness   Diagnostic Tests: CT images personally viewed showing no change in the ascending aorta at 5.3-5.4 cm diameter  Impression: Asymptomatic fusiform aneurysm in a 86 year old with controlled hypertension who does not meet criteria for surgical therapy.  Plan: Continue to control blood pressure and monitor blood pressure is the best preventive therapy for her situation.  We will continue annual CT scans to assess the aorta and to help her and her family prognosticate the risk of tear/dissection.   Len Childs, MD [facility] [phone]

## 2020-02-08 DIAGNOSIS — N8111 Cystocele, midline: Secondary | ICD-10-CM | POA: Diagnosis not present

## 2020-03-19 DIAGNOSIS — M5432 Sciatica, left side: Secondary | ICD-10-CM | POA: Diagnosis not present

## 2020-03-19 DIAGNOSIS — R32 Unspecified urinary incontinence: Secondary | ICD-10-CM | POA: Diagnosis not present

## 2020-03-19 DIAGNOSIS — Z1389 Encounter for screening for other disorder: Secondary | ICD-10-CM | POA: Diagnosis not present

## 2020-03-19 DIAGNOSIS — M5451 Vertebrogenic low back pain: Secondary | ICD-10-CM | POA: Diagnosis not present

## 2020-03-19 DIAGNOSIS — E559 Vitamin D deficiency, unspecified: Secondary | ICD-10-CM | POA: Diagnosis not present

## 2020-03-19 DIAGNOSIS — E0500 Thyrotoxicosis with diffuse goiter without thyrotoxic crisis or storm: Secondary | ICD-10-CM | POA: Diagnosis not present

## 2020-03-19 DIAGNOSIS — H9193 Unspecified hearing loss, bilateral: Secondary | ICD-10-CM | POA: Diagnosis not present

## 2020-03-19 DIAGNOSIS — E7800 Pure hypercholesterolemia, unspecified: Secondary | ICD-10-CM | POA: Diagnosis not present

## 2020-03-19 DIAGNOSIS — L309 Dermatitis, unspecified: Secondary | ICD-10-CM | POA: Diagnosis not present

## 2020-03-19 DIAGNOSIS — Z0000 Encounter for general adult medical examination without abnormal findings: Secondary | ICD-10-CM | POA: Diagnosis not present

## 2020-03-19 DIAGNOSIS — I10 Essential (primary) hypertension: Secondary | ICD-10-CM | POA: Diagnosis not present

## 2020-03-22 DIAGNOSIS — K219 Gastro-esophageal reflux disease without esophagitis: Secondary | ICD-10-CM | POA: Diagnosis not present

## 2020-03-22 DIAGNOSIS — J40 Bronchitis, not specified as acute or chronic: Secondary | ICD-10-CM | POA: Diagnosis not present

## 2020-03-22 DIAGNOSIS — I10 Essential (primary) hypertension: Secondary | ICD-10-CM | POA: Diagnosis not present

## 2020-03-22 DIAGNOSIS — E7800 Pure hypercholesterolemia, unspecified: Secondary | ICD-10-CM | POA: Diagnosis not present

## 2020-03-22 DIAGNOSIS — J45909 Unspecified asthma, uncomplicated: Secondary | ICD-10-CM | POA: Diagnosis not present

## 2020-03-22 DIAGNOSIS — G4700 Insomnia, unspecified: Secondary | ICD-10-CM | POA: Diagnosis not present

## 2020-03-29 DIAGNOSIS — E0500 Thyrotoxicosis with diffuse goiter without thyrotoxic crisis or storm: Secondary | ICD-10-CM | POA: Diagnosis not present

## 2020-03-29 DIAGNOSIS — E559 Vitamin D deficiency, unspecified: Secondary | ICD-10-CM | POA: Diagnosis not present

## 2020-03-29 DIAGNOSIS — I10 Essential (primary) hypertension: Secondary | ICD-10-CM | POA: Diagnosis not present

## 2020-03-29 DIAGNOSIS — R32 Unspecified urinary incontinence: Secondary | ICD-10-CM | POA: Diagnosis not present

## 2020-04-13 DIAGNOSIS — K219 Gastro-esophageal reflux disease without esophagitis: Secondary | ICD-10-CM | POA: Diagnosis not present

## 2020-04-13 DIAGNOSIS — J45909 Unspecified asthma, uncomplicated: Secondary | ICD-10-CM | POA: Diagnosis not present

## 2020-04-13 DIAGNOSIS — I10 Essential (primary) hypertension: Secondary | ICD-10-CM | POA: Diagnosis not present

## 2020-04-13 DIAGNOSIS — E7800 Pure hypercholesterolemia, unspecified: Secondary | ICD-10-CM | POA: Diagnosis not present

## 2020-04-13 DIAGNOSIS — G4700 Insomnia, unspecified: Secondary | ICD-10-CM | POA: Diagnosis not present

## 2020-04-13 DIAGNOSIS — J40 Bronchitis, not specified as acute or chronic: Secondary | ICD-10-CM | POA: Diagnosis not present

## 2020-04-18 DIAGNOSIS — Z8709 Personal history of other diseases of the respiratory system: Secondary | ICD-10-CM | POA: Diagnosis not present

## 2020-04-18 DIAGNOSIS — R053 Chronic cough: Secondary | ICD-10-CM | POA: Diagnosis not present

## 2020-04-28 ENCOUNTER — Other Ambulatory Visit: Payer: Self-pay

## 2020-04-28 ENCOUNTER — Encounter: Payer: Self-pay | Admitting: Emergency Medicine

## 2020-04-28 ENCOUNTER — Emergency Department
Admission: EM | Admit: 2020-04-28 | Discharge: 2020-04-28 | Disposition: A | Discharge status: Home / Self Care | Payer: Medicare HMO | Source: Home / Self Care

## 2020-04-28 DIAGNOSIS — N390 Urinary tract infection, site not specified: Secondary | ICD-10-CM | POA: Diagnosis not present

## 2020-04-28 DIAGNOSIS — R300 Dysuria: Secondary | ICD-10-CM | POA: Diagnosis not present

## 2020-04-28 LAB — POCT URINALYSIS DIP (MANUAL ENTRY)
Bilirubin, UA: NEGATIVE
Glucose, UA: NEGATIVE mg/dL
Ketones, POC UA: NEGATIVE mg/dL
Nitrite, UA: NEGATIVE
Protein Ur, POC: NEGATIVE mg/dL
Spec Grav, UA: 1.015 (ref 1.010–1.025)
Urobilinogen, UA: 0.2 E.U./dL
pH, UA: 7 (ref 5.0–8.0)

## 2020-04-28 MED ORDER — CEPHALEXIN 500 MG PO CAPS: 500.0000 mg | ORAL_CAPSULE | Freq: Two times a day (BID) | ORAL | 0 refills | Status: AC

## 2020-04-28 NOTE — ED Triage Notes (Signed)
Increased frequency last 2 days w/ cloudiness No OTC meds

## 2020-04-28 NOTE — ED Provider Notes (Signed)
[facility]   CC: UTI  SUBJECTIVE:  Latoya Curry is a 87 y.o. female who complains of urinary frequency, urgency and dysuria for the past 3-4 days. Reports that her urine is darker than usual and that she is having to void more frequently than usual. Patient denies a precipitating event, recent sexual encounter, excessive caffeine intake. Localizes the pain to the lower abdomen. Pain is intermittent and describes it as sharp. Has not attempted OTC treatment. Symptoms are made worse with urination. Admits to similar symptoms in the past. Denies fever, chills, nausea, vomiting, flank pain, abnormal vaginal discharge or bleeding, hematuria.    LMP: No LMP recorded. Patient is postmenopausal.  ROS: As in HPI.  All other pertinent ROS negative.     Past Medical History:  Diagnosis Date  . Acute gangrenous appendicitis s/p lap appendectomy 06/24/2018 06/23/2018  . Aortic root enlargement ([place]) 08/19/2017  . Ascending aorta dilation ([place]) 08/19/2017  . Chest discomfort 08/05/2017  . Chronic low back pain   . Cough 09/23/2011   Followed in Pulmonary clinic/ [facility]/ [facility]   - Sinus CT 09/25/2011 > neg   . Diverticulitis   . DJD (degenerative joint disease) of knee   . Essential hypertension 08/05/2017  . Hypercholesteremia   . Hypertension   . Hypokalemia 06/24/2018  . Incarcerated incisional hernia s/p primary repair 06/24/2018 06/24/2018  . Mixed dyslipidemia 08/05/2017  . Neurodermatitis   . Obesity   . Osteopenia   . Pain in left hip 03/16/2019  . PVC (premature ventricular contraction) 08/05/2017  . Recurrent UTI   . Small bowel obstruction, partial, s/p lap adhesiolysis 06/24/2018 06/24/2018  . Thoracic aortic aneurysm ([place])   . Transient global amnesia    Past Surgical History:  Procedure Laterality Date  . COLON RESECTION SIGMOID  2004   Dr Dalbert Batman  . EXPLORATORY LAPAROTOMY  2004   r/o pancreas mass.  Dr Dalbert Batman  . IR RADIOLOGIST EVAL & MGMT  07/14/2018  .  LAPAROSCOPIC APPENDECTOMY N/A 06/24/2018   Procedure: ACUTE GANGRENOUS APPENDICITIS WITH ABSCESS, PARTIAL SMALL BOWEL OBSTRUCTION, INCISIONAL HERNIA REPAIR, LYSIS OF ADHESIONS;  Surgeon: Michael Boston, MD;  Location: [facility];  Service: General;  Laterality: N/A;  . TOTAL KNEE ARTHROPLASTY     bilaterallly   Allergies  Allergen Reactions  . Codeine Nausea And Vomiting  . Bactrim [Sulfamethoxazole-Trimethoprim] Other (See Comments)    unknown  . Zestril [Lisinopril] Cough  . Zocor [Simvastatin] Other (See Comments)    unknown   No current facility-administered medications on file prior to encounter.   Current Outpatient Medications on File Prior to Encounter  Medication Sig Dispense Refill  . amLODipine (NORVASC) 2.5 MG tablet Take 2.5 mg by mouth daily.    Marland Kitchen atenolol-chlorthalidone (TENORETIC) 50-25 MG per tablet Take 1 tablet by mouth daily.    . Budeson-Glycopyrrol-Formoterol (BREZTRI AEROSPHERE) 160-9-4.8 MCG/ACT AERO 1 puff    . cetirizine (ZYRTEC) 10 MG tablet Take 10 mg by mouth daily.    . cholecalciferol (VITAMIN D) 1000 UNITS tablet Take 1,000 Units by mouth daily.    . citalopram (CELEXA) 20 MG tablet Take 20 mg by mouth daily.    Marland Kitchen levothyroxine (SYNTHROID) 50 MCG tablet Take 50 mcg by mouth daily before breakfast.     . omeprazole (PRILOSEC) 20 MG capsule Take 30- 60 min before your first and last meals of the day    . potassium chloride (K-DUR) 10 MEQ tablet Take 10 mEq by mouth daily.    Marland Kitchen  rosuvastatin (CRESTOR) 5 MG tablet Take 5 mg by mouth daily.    . TOVIAZ 8 MG TB24 tablet Take 8 mg by mouth daily.    . traMADol (ULTRAM) 50 MG tablet Take by mouth every 12 (twelve) hours as needed.    Marland Kitchen aspirin 81 MG tablet Take 81 mg by mouth See admin instructions. Monday, Wednesday and Friday    . augmented betamethasone dipropionate (DIPROLENE-AF) 3.70 % cream 1 application to affected area    . Multiple Vitamin (MULTI VITAMIN) TABS 2 tablets    . naproxen sodium (ALEVE) 220 MG  tablet 1 tablet with food or milk as needed     Social History   Socioeconomic History  . Marital status: Widowed    Spouse name: Not on file  . Number of children: 3  . Years of education: Not on file  . Highest education level: Not on file  Occupational History  . 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 Smoking status: Never Smoker  . Smokeless tobacco: Never Used  Vaping Use  . Vaping Use: Never used  Substance and Sexual Activity  . Alcohol use: No  . Drug use: No  . Sexual activity: Not on file  Other Topics Concern  . Not on file  Social History Narrative  . Not on file   Social Determinants of Health   Financial Resource Strain: Not on file  Food Insecurity: Not on file  Transportation Needs: Not on file  Physical Activity: Not on file  Stress: Not on file  Social Connections: Not on file  Intimate Partner Violence: Not on file   Family History  Problem Relation Age of Onset  . Heart disease Father   . Leukemia Mother     OBJECTIVE:  Vitals:   04/28/20 1137  BP: 125/80  Pulse: 77  Resp: 17  Temp: 98.5 F (36.9 C)  TempSrc: Oral  SpO2: 96%   General appearance: AOx3 in no acute distress HEENT: NCAT. Oropharynx clear.  Lungs: clear to auscultation bilaterally without adventitious breath sounds Heart: regular rate and rhythm. Radial pulses 2+ symmetrical bilaterally Abdomen: soft; non-distended; suprapubic tenderness; bowel sounds present; no guarding or rebound tenderness Back: no CVA tenderness Extremities: no edema; symmetrical with no gross deformities Skin: warm and dry Neurologic: Ambulates from chair to exam table without difficulty Psychological: alert and cooperative; normal mood and affect  Labs Reviewed  POCT URINALYSIS DIP (MANUAL ENTRY) - Abnormal; Notable for the following components:      Result Value   Clarity, UA cloudy (*)    Blood, UA moderate (*)    Leukocytes, UA Large (3+) (*)    All other  components within normal limits  URINE CULTURE    ASSESSMENT & PLAN:  1. Lower urinary tract infectious disease   2. Dysuria     Meds ordered this encounter  Medications  . cephALEXin (KEFLEX) 500 MG capsule    Sig: Take 1 capsule (500 mg total) by mouth 2 (two) times daily for 7 days.    Dispense:  14 capsule    Refill:  0    Order Specific Question:   Supervising Provider    Answer:   Chase Picket [id number]    Urine culture sent  We will call you with abnormal results that need further treatment Push fluids and get plenty of rest Prescribed Keflex BID x 7 days Take antibiotic as directed and to completion Take pyridium as prescribed and  as needed for symptomatic relief Follow up with PCP if symptoms persists Return here or go to ER if you have any new or worsening symptoms such as fever, worsening abdominal pain, nausea/vomiting, flank pain  Outlined signs and symptoms indicating need for more acute intervention Patient verbalized understanding After Visit Summary given     Faustino Congress, NP 04/28/20 1158

## 2020-04-28 NOTE — Discharge Instructions (Signed)
You may have a urinary tract infection.   I have sent in Keflex for you to take twice a day for 7 days  We are going to culture your urine and will call you as soon as we have the results.   Drink plenty of water, 8-10 glasses per day.   You may take AZO over the counter for painful urination.  Follow up with your primary care provider as needed.   Go to the Emergency Department if you experience severe pain, shortness of breath, high fever, or other concerns.   

## 2020-04-30 LAB — URINE CULTURE
MICRO NUMBER:: 11613580
SPECIMEN QUALITY:: ADEQUATE

## 2020-05-01 ENCOUNTER — Telehealth (HOSPITAL_COMMUNITY): Payer: Self-pay | Admitting: Emergency Medicine

## 2020-05-01 MED ORDER — CIPROFLOXACIN HCL 500 MG PO TABS
500.0000 mg | ORAL_TABLET | Freq: Two times a day (BID) | ORAL | 0 refills | Status: DC
Start: 2020-05-01 — End: 2020-05-08

## 2020-05-07 DIAGNOSIS — Z8719 Personal history of other diseases of the digestive system: Secondary | ICD-10-CM

## 2020-05-07 DIAGNOSIS — M25551 Pain in right hip: Secondary | ICD-10-CM

## 2020-05-07 DIAGNOSIS — R079 Chest pain, unspecified: Secondary | ICD-10-CM

## 2020-05-07 DIAGNOSIS — Z0000 Encounter for general adult medical examination without abnormal findings: Secondary | ICD-10-CM

## 2020-05-07 DIAGNOSIS — E7800 Pure hypercholesterolemia, unspecified: Secondary | ICD-10-CM

## 2020-05-07 DIAGNOSIS — R209 Unspecified disturbances of skin sensation: Secondary | ICD-10-CM

## 2020-05-07 DIAGNOSIS — B349 Viral infection, unspecified: Secondary | ICD-10-CM

## 2020-05-07 DIAGNOSIS — G8929 Other chronic pain: Secondary | ICD-10-CM | POA: Insufficient documentation

## 2020-05-07 DIAGNOSIS — R040 Epistaxis: Secondary | ICD-10-CM

## 2020-05-07 DIAGNOSIS — R197 Diarrhea, unspecified: Secondary | ICD-10-CM | POA: Insufficient documentation

## 2020-05-07 DIAGNOSIS — H8110 Benign paroxysmal vertigo, unspecified ear: Secondary | ICD-10-CM

## 2020-05-07 DIAGNOSIS — M542 Cervicalgia: Secondary | ICD-10-CM

## 2020-05-07 DIAGNOSIS — J209 Acute bronchitis, unspecified: Secondary | ICD-10-CM

## 2020-05-07 DIAGNOSIS — I499 Cardiac arrhythmia, unspecified: Secondary | ICD-10-CM

## 2020-05-07 DIAGNOSIS — R630 Anorexia: Secondary | ICD-10-CM

## 2020-05-07 DIAGNOSIS — J309 Allergic rhinitis, unspecified: Secondary | ICD-10-CM

## 2020-05-07 DIAGNOSIS — G454 Transient global amnesia: Secondary | ICD-10-CM | POA: Insufficient documentation

## 2020-05-07 DIAGNOSIS — H9190 Unspecified hearing loss, unspecified ear: Secondary | ICD-10-CM

## 2020-05-07 DIAGNOSIS — F4320 Adjustment disorder, unspecified: Secondary | ICD-10-CM | POA: Insufficient documentation

## 2020-05-07 DIAGNOSIS — J3489 Other specified disorders of nose and nasal sinuses: Secondary | ICD-10-CM

## 2020-05-07 DIAGNOSIS — R1032 Left lower quadrant pain: Secondary | ICD-10-CM

## 2020-05-07 DIAGNOSIS — E0500 Thyrotoxicosis with diffuse goiter without thyrotoxic crisis or storm: Secondary | ICD-10-CM

## 2020-05-07 DIAGNOSIS — N899 Noninflammatory disorder of vagina, unspecified: Secondary | ICD-10-CM | POA: Insufficient documentation

## 2020-05-07 DIAGNOSIS — M545 Low back pain, unspecified: Secondary | ICD-10-CM

## 2020-05-07 DIAGNOSIS — I10 Essential (primary) hypertension: Secondary | ICD-10-CM | POA: Insufficient documentation

## 2020-05-07 DIAGNOSIS — N3942 Incontinence without sensory awareness: Secondary | ICD-10-CM | POA: Insufficient documentation

## 2020-05-07 DIAGNOSIS — J45909 Unspecified asthma, uncomplicated: Secondary | ICD-10-CM | POA: Insufficient documentation

## 2020-05-07 DIAGNOSIS — I712 Thoracic aortic aneurysm, without rupture, unspecified: Secondary | ICD-10-CM | POA: Insufficient documentation

## 2020-05-07 DIAGNOSIS — R0982 Postnasal drip: Secondary | ICD-10-CM

## 2020-05-07 DIAGNOSIS — G4700 Insomnia, unspecified: Secondary | ICD-10-CM

## 2020-05-07 DIAGNOSIS — K5792 Diverticulitis of intestine, part unspecified, without perforation or abscess without bleeding: Secondary | ICD-10-CM | POA: Insufficient documentation

## 2020-05-07 DIAGNOSIS — K37 Unspecified appendicitis: Secondary | ICD-10-CM

## 2020-05-07 DIAGNOSIS — G479 Sleep disorder, unspecified: Secondary | ICD-10-CM | POA: Insufficient documentation

## 2020-05-07 DIAGNOSIS — R509 Fever, unspecified: Secondary | ICD-10-CM

## 2020-05-07 DIAGNOSIS — K219 Gastro-esophageal reflux disease without esophagitis: Secondary | ICD-10-CM

## 2020-05-07 DIAGNOSIS — L309 Dermatitis, unspecified: Secondary | ICD-10-CM | POA: Insufficient documentation

## 2020-05-07 DIAGNOSIS — M1710 Unilateral primary osteoarthritis, unspecified knee: Secondary | ICD-10-CM | POA: Insufficient documentation

## 2020-05-07 DIAGNOSIS — N390 Urinary tract infection, site not specified: Secondary | ICD-10-CM

## 2020-05-07 DIAGNOSIS — R634 Abnormal weight loss: Secondary | ICD-10-CM | POA: Insufficient documentation

## 2020-05-07 DIAGNOSIS — K5900 Constipation, unspecified: Secondary | ICD-10-CM

## 2020-05-07 DIAGNOSIS — R21 Rash and other nonspecific skin eruption: Secondary | ICD-10-CM | POA: Insufficient documentation

## 2020-05-07 DIAGNOSIS — L280 Lichen simplex chronicus: Secondary | ICD-10-CM | POA: Insufficient documentation

## 2020-05-07 DIAGNOSIS — M179 Osteoarthritis of knee, unspecified: Secondary | ICD-10-CM | POA: Insufficient documentation

## 2020-05-07 DIAGNOSIS — H109 Unspecified conjunctivitis: Secondary | ICD-10-CM | POA: Insufficient documentation

## 2020-05-07 DIAGNOSIS — E559 Vitamin D deficiency, unspecified: Secondary | ICD-10-CM

## 2020-05-07 DIAGNOSIS — Z79899 Other long term (current) drug therapy: Secondary | ICD-10-CM | POA: Insufficient documentation

## 2020-05-07 DIAGNOSIS — M8580 Other specified disorders of bone density and structure, unspecified site: Secondary | ICD-10-CM | POA: Insufficient documentation

## 2020-05-07 DIAGNOSIS — K5732 Diverticulitis of large intestine without perforation or abscess without bleeding: Secondary | ICD-10-CM

## 2020-05-07 DIAGNOSIS — E0590 Thyrotoxicosis, unspecified without thyrotoxic crisis or storm: Secondary | ICD-10-CM | POA: Insufficient documentation

## 2020-05-07 DIAGNOSIS — R252 Cramp and spasm: Secondary | ICD-10-CM | POA: Insufficient documentation

## 2020-05-07 DIAGNOSIS — K5730 Diverticulosis of large intestine without perforation or abscess without bleeding: Secondary | ICD-10-CM

## 2020-05-07 DIAGNOSIS — E669 Obesity, unspecified: Secondary | ICD-10-CM | POA: Insufficient documentation

## 2020-05-07 DIAGNOSIS — R61 Generalized hyperhidrosis: Secondary | ICD-10-CM | POA: Insufficient documentation

## 2020-05-07 DIAGNOSIS — J40 Bronchitis, not specified as acute or chronic: Secondary | ICD-10-CM

## 2020-05-07 DIAGNOSIS — M5430 Sciatica, unspecified side: Secondary | ICD-10-CM

## 2020-05-07 DIAGNOSIS — Z609 Problem related to social environment, unspecified: Secondary | ICD-10-CM

## 2020-05-07 DIAGNOSIS — M79641 Pain in right hand: Secondary | ICD-10-CM | POA: Insufficient documentation

## 2020-05-07 HISTORY — DX: Benign paroxysmal vertigo, unspecified ear: H81.10

## 2020-05-07 HISTORY — DX: Other long term (current) drug therapy: Z79.899

## 2020-05-07 HISTORY — DX: Personal history of other diseases of the digestive system: Z87.19

## 2020-05-07 HISTORY — DX: Urinary tract infection, site not specified: N39.0

## 2020-05-07 HISTORY — DX: Dermatitis, unspecified: L30.9

## 2020-05-07 HISTORY — DX: Insomnia, unspecified: G47.00

## 2020-05-07 HISTORY — DX: Pain in right hip: M25.551

## 2020-05-07 HISTORY — DX: Abnormal weight loss: R63.4

## 2020-05-07 HISTORY — DX: Allergic rhinitis, unspecified: J30.9

## 2020-05-07 HISTORY — DX: Cervicalgia: M54.2

## 2020-05-07 HISTORY — DX: Encounter for general adult medical examination without abnormal findings: Z00.00

## 2020-05-07 HISTORY — DX: Chest pain, unspecified: R07.9

## 2020-05-07 HISTORY — DX: Diverticulosis of large intestine without perforation or abscess without bleeding: K57.30

## 2020-05-07 HISTORY — DX: Acute bronchitis, unspecified: J20.9

## 2020-05-07 HISTORY — DX: Generalized hyperhidrosis: R61

## 2020-05-07 HISTORY — DX: Unspecified appendicitis: K37

## 2020-05-07 HISTORY — DX: Unspecified hearing loss, unspecified ear: H91.90

## 2020-05-07 HISTORY — DX: Vitamin D deficiency, unspecified: E55.9

## 2020-05-07 HISTORY — DX: Cardiac arrhythmia, unspecified: I49.9

## 2020-05-07 HISTORY — DX: Sleep disorder, unspecified: G47.9

## 2020-05-07 HISTORY — DX: Cramp and spasm: R25.2

## 2020-05-07 HISTORY — DX: Viral infection, unspecified: B34.9

## 2020-05-07 HISTORY — DX: Postnasal drip: R09.82

## 2020-05-07 HISTORY — DX: Anorexia: R63.0

## 2020-05-07 HISTORY — DX: Diarrhea, unspecified: R19.7

## 2020-05-07 HISTORY — DX: Thoracic aortic aneurysm, without rupture: I71.2

## 2020-05-07 HISTORY — DX: Pure hypercholesterolemia, unspecified: E78.00

## 2020-05-07 HISTORY — DX: Unspecified disturbances of skin sensation: R20.9

## 2020-05-07 HISTORY — DX: Gastro-esophageal reflux disease without esophagitis: K21.9

## 2020-05-07 HISTORY — DX: Thyrotoxicosis, unspecified without thyrotoxic crisis or storm: E05.90

## 2020-05-07 HISTORY — DX: Epistaxis: R04.0

## 2020-05-07 HISTORY — DX: Other specified disorders of nose and nasal sinuses: J34.89

## 2020-05-07 HISTORY — DX: Problem related to social environment, unspecified: Z60.9

## 2020-05-07 HISTORY — DX: Fever, unspecified: R50.9

## 2020-05-07 HISTORY — DX: Noninflammatory disorder of vagina, unspecified: N89.9

## 2020-05-07 HISTORY — DX: Thyrotoxicosis with diffuse goiter without thyrotoxic crisis or storm: E05.00

## 2020-05-07 HISTORY — DX: Incontinence without sensory awareness: N39.42

## 2020-05-07 HISTORY — DX: Bronchitis, not specified as acute or chronic: J40

## 2020-05-07 HISTORY — DX: Unspecified asthma, uncomplicated: J45.909

## 2020-05-07 HISTORY — DX: Unspecified conjunctivitis: H10.9

## 2020-05-07 HISTORY — DX: Adjustment disorder, unspecified: F43.20

## 2020-05-07 HISTORY — DX: Constipation, unspecified: K59.00

## 2020-05-07 HISTORY — DX: Left lower quadrant pain: R10.32

## 2020-05-07 HISTORY — DX: Rash and other nonspecific skin eruption: R21

## 2020-05-07 HISTORY — DX: Low back pain, unspecified: M54.50

## 2020-05-07 HISTORY — DX: Diverticulitis of large intestine without perforation or abscess without bleeding: K57.32

## 2020-05-07 HISTORY — DX: Pain in right hand: M79.641

## 2020-05-07 HISTORY — DX: Thoracic aortic aneurysm, without rupture, unspecified: I71.20

## 2020-05-07 HISTORY — DX: Sciatica, unspecified side: M54.30

## 2020-05-08 ENCOUNTER — Other Ambulatory Visit: Payer: Self-pay

## 2020-05-08 ENCOUNTER — Ambulatory Visit: Payer: Medicare HMO | Admitting: Cardiology

## 2020-05-08 ENCOUNTER — Encounter: Payer: Self-pay | Admitting: Cardiology

## 2020-05-08 VITALS — BP 116/68 | HR 80 | Ht 66.0 in | Wt 159.1 lb

## 2020-05-08 DIAGNOSIS — I700 Atherosclerosis of aorta: Secondary | ICD-10-CM | POA: Diagnosis not present

## 2020-05-08 DIAGNOSIS — E7800 Pure hypercholesterolemia, unspecified: Secondary | ICD-10-CM

## 2020-05-08 DIAGNOSIS — I712 Thoracic aortic aneurysm, without rupture, unspecified: Secondary | ICD-10-CM

## 2020-05-08 DIAGNOSIS — I10 Essential (primary) hypertension: Secondary | ICD-10-CM

## 2020-05-08 DIAGNOSIS — I351 Nonrheumatic aortic (valve) insufficiency: Secondary | ICD-10-CM | POA: Diagnosis not present

## 2020-05-08 NOTE — Patient Instructions (Signed)

## 2020-05-08 NOTE — Progress Notes (Signed)
Cardiology Office Note:    Date:  05/08/2020   ID:  [name], DOB 09/10/1930, MRN [id number]  PCP:  Josetta Huddle, MD  Cardiologist:  Jenean Lindau, MD   Referring MD: Josetta Huddle, MD    ASSESSMENT:    1. Essential hypertension   2. Thoracic aortic atherosclerosis ([place])   3. Thoracic aortic aneurysm without rupture ([place])   4. Hypercholesteremia   5. Aortic valve insufficiency, etiology of cardiac valve disease unspecified    PLAN:    In order of problems listed above:  1. Secondary prevention stressed with the patient.  Importance of compliance with diet medication stressed and she vocalized understanding. 2. Aneurysm of the ascending aorta: Stable at this time.  Continue current medications.  Medical management in view of comorbidities patient had daughter understand and they have had this discussion with the surgeon. 3. Essential hypertension: Blood pressure stable and diet was emphasized. 4. Mixed dyslipidemia: Lipids reviewed.  These are managed by primary care physician.  Lipids are fine. 5. Patient and daughter had multiple questions which were answered to their satisfaction. 6. Patient will be seen in follow-up appointment in 6 months or earlier if the patient has any concerns    Medication Adjustments/Labs and Tests Ordered: Current medicines are reviewed at length with the patient today.  Concerns regarding medicines are outlined above.  Orders Placed This Encounter  Procedures   EKG 12-Lead   No orders of the defined types were placed in this encounter.    No chief complaint on file.    History of Present Illness:    Latoya Curry is a 87 y.o. female.  Patient has past medical history of ascending aortic aneurysm, aortic regurgitation, aortic atherosclerosis and hypertension and mixed dyslipidemia.  She is here accompanied by her daughter.  She denies any chest pain orthopnea or PND.  She leads a sedentary lifestyle.  At the time of my  evaluation, the patient is alert awake oriented and in no distress.  Past Medical History:  Diagnosis Date   Acute bronchitis 05/07/2020   Acute gangrenous appendicitis s/p lap appendectomy 06/24/2018 06/23/2018   Adjustment disorder 05/07/2020   Allergic bronchitis 05/07/2020   Allergic rhinitis 05/07/2020   Aortic regurgitation 05/10/2019   Aortic root enlargement ([place]) 08/19/2017   Appendicitis 05/07/2020   Ascending aorta dilation ([facility]) 08/19/2017   Benign paroxysmal positional vertigo 05/07/2020   Bronchitis 05/07/2020   Cardiac arrhythmia 05/07/2020   Chest discomfort 08/05/2017   Chest pain 05/07/2020   Chronic low back pain    Conjunctivitis of left eye 05/07/2020   Constipation 05/07/2020   Cough 09/23/2011   Followed in Pulmonary clinic/ [facility]/ [facility]   - Sinus CT 09/25/2011 > neg    Diaphoresis 05/07/2020   Diarrhea 05/07/2020   Diverticular disease of colon 05/07/2020   Diverticulitis    Diverticulitis of colon 05/07/2020   DJD (degenerative joint disease) of knee    Eczema 05/07/2020   Encounter for general adult medical examination without abnormal findings 05/07/2020   Epistaxis 05/07/2020   Essential hypertension 08/05/2017   Fever 05/07/2020   Gastro-esophageal reflux disease without esophagitis 05/07/2020   Graves' disease 05/07/2020   Hearing loss 05/07/2020   History of diverticulitis 05/07/2020   Hypercholesteremia    Hypertension    Hyperthyroidism 05/07/2020   Hypokalemia 06/24/2018   Incarcerated incisional hernia s/p primary repair 06/24/2018 06/24/2018   Incontinence without sensory awareness 05/07/2020   Insomnia 05/07/2020   Left lower quadrant pain 05/07/2020  Loss of appetite 05/07/2020   Low back pain 05/07/2020   Mixed dyslipidemia 08/05/2017   Nasal crusting 05/07/2020   Neck pain 05/07/2020   Neurodermatitis    Noninflammatory disorder of vagina 05/07/2020   Obesity    Osteopenia    Other long term (current)  drug therapy 05/07/2020   Pain in left hip 03/16/2019   Pain in right hand 05/07/2020   Postnasal drip 05/07/2020   Problem related to social environment 05/07/2020   Pure hypercholesterolemia 05/07/2020   PVC (premature ventricular contraction) 08/05/2017   Rash 05/07/2020   Recurrent UTI    Right hip pain 05/07/2020   Sciatica 05/07/2020   Skin sensation disturbance 05/07/2020   Sleep disorder 05/07/2020   Small bowel obstruction, partial, s/p lap adhesiolysis 06/24/2018 06/24/2018   Spasm 05/07/2020   Thoracic aortic aneurysm [facility])    Thoracic aortic aneurysm without rupture ([place]) 05/07/2020   Thoracic aortic atherosclerosis ([place]) 01/11/2020   Thyrotoxicosis 05/07/2020   Transient global amnesia    Urinary tract infectious disease 05/07/2020   Viral syndrome 05/07/2020   Vitamin D deficiency 05/07/2020   Weight loss 05/07/2020    Past Surgical History:  Procedure Laterality Date   COLON RESECTION SIGMOID  2004   Dr Dalbert Batman   EXPLORATORY LAPAROTOMY  2004   r/o pancreas mass.  Dr Dalbert Batman   IR RADIOLOGIST EVAL & MGMT  07/14/2018   LAPAROSCOPIC APPENDECTOMY N/A 06/24/2018   Procedure: ACUTE GANGRENOUS APPENDICITIS WITH ABSCESS, PARTIAL SMALL BOWEL OBSTRUCTION, INCISIONAL HERNIA REPAIR, LYSIS OF ADHESIONS;  Surgeon: Michael Boston, MD;  Location: [facility];  Service: General;  Laterality: N/A;   TOTAL KNEE ARTHROPLASTY     bilaterallly    Current Medications: Current Meds  Medication Sig   amLODipine (NORVASC) 2.5 MG tablet Take 2.5 mg by mouth daily.   aspirin 81 MG tablet Take 81 mg by mouth every Monday, Wednesday, and Friday.   atenolol-chlorthalidone (TENORETIC) 50-25 MG per tablet Take 1 tablet by mouth daily.   augmented betamethasone dipropionate (DIPROLENE-AF) 0.05 % cream Apply 1 application topically as needed (rash).   Budeson-Glycopyrrol-Formoterol (BREZTRI AEROSPHERE) 160-9-4.8 MCG/ACT AERO Inhale 1 puff into the lungs as needed (wheezing and shortness of  breath).   CANNABIDIOL PO Take 250 mg by mouth 2 (two) times daily.   cetirizine (ZYRTEC) 10 MG tablet Take 10 mg by mouth daily.   cholecalciferol (VITAMIN D) 1000 UNITS tablet Take 1,000 Units by mouth daily.   citalopram (CELEXA) 20 MG tablet Take 20 mg by mouth daily.   levothyroxine (SYNTHROID) 50 MCG tablet Take 50 mcg by mouth daily before breakfast.    Multiple Vitamin (MULTI VITAMIN) TABS Take 2 tablets by mouth daily.   naproxen sodium (ALEVE) 220 MG tablet Take 220 mg by mouth daily as needed (pain).   omeprazole (PRILOSEC) 20 MG capsule Take 20 mg by mouth daily.   potassium chloride (K-DUR) 10 MEQ tablet Take 10 mEq by mouth daily.   rosuvastatin (CRESTOR) 5 MG tablet Take 5 mg by mouth every Monday, Wednesday, and Friday.   TOVIAZ 8 MG TB24 tablet Take 8 mg by mouth daily.   traMADol (ULTRAM) 50 MG tablet Take 50 mg by mouth every 12 (twelve) hours as needed for moderate pain.     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  Tobacco Use   Smoking status: Never Smoker   Smokeless tobacco: Never Used  Scientific laboratory technician Use: Never used  Substance and Sexual Activity   Alcohol use: No   Drug use: No   Sexual activity: Not on file  Other Topics Concern   Not on file  Social History Narrative   Not on file   Social Determinants of Health   Financial Resource Strain: Not on file  Food Insecurity: Not on file  Transportation Needs: Not on file  Physical Activity: Not on file  Stress: Not on file  Social Connections: Not on file     Family History: The patient's family history includes Heart disease in her  father; Leukemia in her mother.  ROS:   Please see the history of present illness.    All other systems reviewed and are negative.  EKGs/Labs/Other Studies Reviewed:    The following studies were reviewed today: EKG reveals sinus rhythm and nonspecific ST-T changes   Recent Labs: No results found for requested labs within last 8760 hours.  Recent Lipid Panel    Component Value Date/Time   CHOL 151 08/25/2017 0910   TRIG 148 08/25/2017 0910   HDL 40 08/25/2017 0910   CHOLHDL 3.8 08/25/2017 0910   LDLCALC 81 08/25/2017 0910    Physical Exam:    VS:  BP 116/68    Pulse 80    Ht 5\' 6"  (1.676 m)    Wt 159 lb 1.3 oz (72.2 kg)    SpO2 94%    BMI 25.68 kg/m     Wt Readings from Last 3 Encounters:  05/08/20 159 lb 1.3 oz (72.2 kg)  01/11/20 159 lb 3.2 oz (72.2 kg)  11/16/19 159 lb (72.1 kg)     GEN: Patient is in no acute distress HEENT: Normal NECK: No JVD; No carotid bruits LYMPHATICS: No lymphadenopathy CARDIAC: Hear sounds regular, 2/6 systolic murmur at the apex. RESPIRATORY:  Clear to auscultation without rales, wheezing or rhonchi  ABDOMEN: Soft, non-tender, non-distended MUSCULOSKELETAL:  No edema; No deformity  SKIN: Warm and dry NEUROLOGIC:  Alert and oriented x 3 PSYCHIATRIC:  Normal affect   Signed, Jenean Lindau, MD  05/08/2020 11:11 AM    [place]

## 2020-05-21 DIAGNOSIS — N3000 Acute cystitis without hematuria: Secondary | ICD-10-CM | POA: Diagnosis not present

## 2020-05-21 DIAGNOSIS — N8111 Cystocele, midline: Secondary | ICD-10-CM | POA: Diagnosis not present

## 2020-06-05 DIAGNOSIS — J40 Bronchitis, not specified as acute or chronic: Secondary | ICD-10-CM | POA: Diagnosis not present

## 2020-06-05 DIAGNOSIS — E7800 Pure hypercholesterolemia, unspecified: Secondary | ICD-10-CM | POA: Diagnosis not present

## 2020-06-05 DIAGNOSIS — I10 Essential (primary) hypertension: Secondary | ICD-10-CM | POA: Diagnosis not present

## 2020-06-05 DIAGNOSIS — J45909 Unspecified asthma, uncomplicated: Secondary | ICD-10-CM | POA: Diagnosis not present

## 2020-06-05 DIAGNOSIS — G4700 Insomnia, unspecified: Secondary | ICD-10-CM | POA: Diagnosis not present

## 2020-06-05 DIAGNOSIS — K219 Gastro-esophageal reflux disease without esophagitis: Secondary | ICD-10-CM | POA: Diagnosis not present

## 2020-06-19 ENCOUNTER — Ambulatory Visit: Payer: Medicare HMO | Attending: Internal Medicine

## 2020-06-19 DIAGNOSIS — Z23 Encounter for immunization: Secondary | ICD-10-CM

## 2020-06-19 NOTE — Progress Notes (Signed)
   Covid-19 Vaccination Clinic  Name:  Latoya Curry    MRN: [id number] DOB: March 20, 1930  06/19/2020  [name] was observed post Covid-19 immunization for 15 minutes without incident. She was provided with Vaccine Information Sheet and instruction to access the V-Safe system.   [name] was instructed to call 911 with any severe reactions post vaccine: Marland Kitchen Difficulty breathing  . Swelling of face and throat  . A fast heartbeat  . A bad rash all over body  . Dizziness and weakness   Immunizations Administered    Name Date Dose VIS Date Route   PFIZER Comrnaty(Gray TOP) Covid-19 Vaccine 06/19/2020  1:42 PM 0.3 mL 02/02/2020 Intramuscular   Manufacturer: [employer]   Lot: [id number]   NDC: [phone]

## 2020-06-21 ENCOUNTER — Other Ambulatory Visit (HOSPITAL_BASED_OUTPATIENT_CLINIC_OR_DEPARTMENT_OTHER): Payer: Self-pay

## 2020-06-21 MED ORDER — PFIZER-BIONT COVID-19 VAC-TRIS 30 MCG/0.3ML IM SUSP
INTRAMUSCULAR | 0 refills | Status: DC
  Filled 2020-06-21: qty 0.3, 1d supply, fill #0

## 2020-06-22 ENCOUNTER — Other Ambulatory Visit (HOSPITAL_BASED_OUTPATIENT_CLINIC_OR_DEPARTMENT_OTHER): Payer: Self-pay

## 2020-07-06 DIAGNOSIS — N8111 Cystocele, midline: Secondary | ICD-10-CM | POA: Diagnosis not present

## 2020-07-09 DIAGNOSIS — N8111 Cystocele, midline: Secondary | ICD-10-CM | POA: Diagnosis not present

## 2020-07-10 DIAGNOSIS — G4700 Insomnia, unspecified: Secondary | ICD-10-CM | POA: Diagnosis not present

## 2020-07-10 DIAGNOSIS — E7800 Pure hypercholesterolemia, unspecified: Secondary | ICD-10-CM | POA: Diagnosis not present

## 2020-07-10 DIAGNOSIS — I10 Essential (primary) hypertension: Secondary | ICD-10-CM | POA: Diagnosis not present

## 2020-07-10 DIAGNOSIS — J45909 Unspecified asthma, uncomplicated: Secondary | ICD-10-CM | POA: Diagnosis not present

## 2020-07-10 DIAGNOSIS — J40 Bronchitis, not specified as acute or chronic: Secondary | ICD-10-CM | POA: Diagnosis not present

## 2020-07-10 DIAGNOSIS — K219 Gastro-esophageal reflux disease without esophagitis: Secondary | ICD-10-CM | POA: Diagnosis not present

## 2020-07-26 DIAGNOSIS — R42 Dizziness and giddiness: Secondary | ICD-10-CM | POA: Diagnosis not present

## 2020-07-26 DIAGNOSIS — R0902 Hypoxemia: Secondary | ICD-10-CM | POA: Diagnosis not present

## 2020-07-26 DIAGNOSIS — I10 Essential (primary) hypertension: Secondary | ICD-10-CM | POA: Diagnosis not present

## 2020-08-16 ENCOUNTER — Other Ambulatory Visit: Payer: Self-pay | Admitting: Urology

## 2020-08-23 DIAGNOSIS — G4700 Insomnia, unspecified: Secondary | ICD-10-CM | POA: Diagnosis not present

## 2020-08-23 DIAGNOSIS — J45909 Unspecified asthma, uncomplicated: Secondary | ICD-10-CM | POA: Diagnosis not present

## 2020-08-23 DIAGNOSIS — J40 Bronchitis, not specified as acute or chronic: Secondary | ICD-10-CM | POA: Diagnosis not present

## 2020-08-23 DIAGNOSIS — E7800 Pure hypercholesterolemia, unspecified: Secondary | ICD-10-CM | POA: Diagnosis not present

## 2020-08-23 DIAGNOSIS — I10 Essential (primary) hypertension: Secondary | ICD-10-CM | POA: Diagnosis not present

## 2020-08-23 DIAGNOSIS — K219 Gastro-esophageal reflux disease without esophagitis: Secondary | ICD-10-CM | POA: Diagnosis not present

## 2020-09-12 DIAGNOSIS — E7800 Pure hypercholesterolemia, unspecified: Secondary | ICD-10-CM | POA: Diagnosis not present

## 2020-09-12 DIAGNOSIS — H9193 Unspecified hearing loss, bilateral: Secondary | ICD-10-CM | POA: Diagnosis not present

## 2020-09-12 DIAGNOSIS — I10 Essential (primary) hypertension: Secondary | ICD-10-CM | POA: Diagnosis not present

## 2020-09-12 DIAGNOSIS — L309 Dermatitis, unspecified: Secondary | ICD-10-CM | POA: Diagnosis not present

## 2020-09-12 DIAGNOSIS — Z8709 Personal history of other diseases of the respiratory system: Secondary | ICD-10-CM | POA: Diagnosis not present

## 2020-09-12 DIAGNOSIS — E0500 Thyrotoxicosis with diffuse goiter without thyrotoxic crisis or storm: Secondary | ICD-10-CM | POA: Diagnosis not present

## 2020-09-12 DIAGNOSIS — M5432 Sciatica, left side: Secondary | ICD-10-CM | POA: Diagnosis not present

## 2020-09-12 DIAGNOSIS — M5451 Vertebrogenic low back pain: Secondary | ICD-10-CM | POA: Diagnosis not present

## 2020-09-12 DIAGNOSIS — E559 Vitamin D deficiency, unspecified: Secondary | ICD-10-CM | POA: Diagnosis not present

## 2020-10-08 DIAGNOSIS — G4700 Insomnia, unspecified: Secondary | ICD-10-CM | POA: Diagnosis not present

## 2020-10-08 DIAGNOSIS — I10 Essential (primary) hypertension: Secondary | ICD-10-CM | POA: Diagnosis not present

## 2020-10-08 DIAGNOSIS — J45909 Unspecified asthma, uncomplicated: Secondary | ICD-10-CM | POA: Diagnosis not present

## 2020-10-08 DIAGNOSIS — K219 Gastro-esophageal reflux disease without esophagitis: Secondary | ICD-10-CM | POA: Diagnosis not present

## 2020-10-08 DIAGNOSIS — E7800 Pure hypercholesterolemia, unspecified: Secondary | ICD-10-CM | POA: Diagnosis not present

## 2020-10-08 DIAGNOSIS — J40 Bronchitis, not specified as acute or chronic: Secondary | ICD-10-CM | POA: Diagnosis not present

## 2020-11-01 DIAGNOSIS — I10 Essential (primary) hypertension: Secondary | ICD-10-CM | POA: Diagnosis not present

## 2020-11-01 DIAGNOSIS — J45909 Unspecified asthma, uncomplicated: Secondary | ICD-10-CM | POA: Diagnosis not present

## 2020-11-01 DIAGNOSIS — J40 Bronchitis, not specified as acute or chronic: Secondary | ICD-10-CM | POA: Diagnosis not present

## 2020-11-01 DIAGNOSIS — K219 Gastro-esophageal reflux disease without esophagitis: Secondary | ICD-10-CM | POA: Diagnosis not present

## 2020-11-01 DIAGNOSIS — E7800 Pure hypercholesterolemia, unspecified: Secondary | ICD-10-CM | POA: Diagnosis not present

## 2020-11-01 DIAGNOSIS — G4700 Insomnia, unspecified: Secondary | ICD-10-CM | POA: Diagnosis not present

## 2020-11-07 DIAGNOSIS — N8111 Cystocele, midline: Secondary | ICD-10-CM | POA: Diagnosis not present

## 2020-11-14 ENCOUNTER — Ambulatory Visit: Payer: Medicare HMO | Admitting: Cardiology

## 2020-11-27 DIAGNOSIS — L814 Other melanin hyperpigmentation: Secondary | ICD-10-CM | POA: Diagnosis not present

## 2020-11-27 DIAGNOSIS — Z85828 Personal history of other malignant neoplasm of skin: Secondary | ICD-10-CM | POA: Diagnosis not present

## 2020-11-27 DIAGNOSIS — L821 Other seborrheic keratosis: Secondary | ICD-10-CM | POA: Diagnosis not present

## 2020-11-27 DIAGNOSIS — D692 Other nonthrombocytopenic purpura: Secondary | ICD-10-CM | POA: Diagnosis not present

## 2020-11-27 DIAGNOSIS — X32XXXS Exposure to sunlight, sequela: Secondary | ICD-10-CM | POA: Diagnosis not present

## 2020-11-29 DIAGNOSIS — G4700 Insomnia, unspecified: Secondary | ICD-10-CM | POA: Diagnosis not present

## 2020-11-29 DIAGNOSIS — K219 Gastro-esophageal reflux disease without esophagitis: Secondary | ICD-10-CM | POA: Diagnosis not present

## 2020-11-29 DIAGNOSIS — J40 Bronchitis, not specified as acute or chronic: Secondary | ICD-10-CM | POA: Diagnosis not present

## 2020-11-29 DIAGNOSIS — I10 Essential (primary) hypertension: Secondary | ICD-10-CM | POA: Diagnosis not present

## 2020-11-29 DIAGNOSIS — J45909 Unspecified asthma, uncomplicated: Secondary | ICD-10-CM | POA: Diagnosis not present

## 2020-11-29 DIAGNOSIS — E7800 Pure hypercholesterolemia, unspecified: Secondary | ICD-10-CM | POA: Diagnosis not present

## 2020-11-30 ENCOUNTER — Other Ambulatory Visit: Payer: Self-pay | Admitting: Surgery

## 2020-11-30 DIAGNOSIS — I7121 Aneurysm of the ascending aorta, without rupture: Secondary | ICD-10-CM

## 2020-12-11 ENCOUNTER — Other Ambulatory Visit: Payer: Self-pay

## 2020-12-25 ENCOUNTER — Ambulatory Visit: Payer: Medicare HMO | Admitting: Cardiology

## 2020-12-25 ENCOUNTER — Encounter: Payer: Self-pay | Admitting: Cardiology

## 2020-12-25 ENCOUNTER — Other Ambulatory Visit: Payer: Self-pay

## 2020-12-25 VITALS — BP 118/68 | HR 72 | Ht 66.0 in | Wt 159.1 lb

## 2020-12-25 DIAGNOSIS — I351 Nonrheumatic aortic (valve) insufficiency: Secondary | ICD-10-CM | POA: Diagnosis not present

## 2020-12-25 DIAGNOSIS — E0590 Thyrotoxicosis, unspecified without thyrotoxic crisis or storm: Secondary | ICD-10-CM

## 2020-12-25 DIAGNOSIS — E782 Mixed hyperlipidemia: Secondary | ICD-10-CM

## 2020-12-25 DIAGNOSIS — E7800 Pure hypercholesterolemia, unspecified: Secondary | ICD-10-CM

## 2020-12-25 DIAGNOSIS — I10 Essential (primary) hypertension: Secondary | ICD-10-CM

## 2020-12-25 DIAGNOSIS — I712 Thoracic aortic aneurysm, without rupture, unspecified: Secondary | ICD-10-CM | POA: Diagnosis not present

## 2020-12-25 NOTE — Progress Notes (Signed)
Cardiology Office Note:    Date:  12/25/2020   ID:  Latoya Curry, DOB [birth year], MRN [id number]  PCP:  Josetta Huddle, MD  Cardiologist:  Jenean Lindau, MD   Referring MD: Josetta Huddle, MD    ASSESSMENT:    1. Aortic valve insufficiency, etiology of cardiac valve disease unspecified   2. Essential hypertension   3. Hypercholesteremia   4. Thoracic aortic aneurysm without rupture, unspecified part    PLAN:    In order of problems listed above:  Primary prevention stressed with patient.  Importance of compliance with diet medication stressed and she vocalized understanding. Ascending thoracic aortic aneurysm: I discussed last CT scan report with her at length and educated her about symptoms such as tearing chest pain or pain going to the back etc. and she understands.  She has an appointment coming up with our surgical colleagues this month and a follow-up CT scan will be ordered by them. Essential hypertension: Blood pressure stable and diet was emphasized.  Lifestyle modification urged. Mixed dyslipidemia: On statin therapy and we will check lipids today. Patient will be seen in follow-up appointment in 6 months or earlier if the patient has any concerns    Medication Adjustments/Labs and Tests Ordered: Current medicines are reviewed at length with the patient today.  Concerns regarding medicines are outlined above.  No orders of the defined types were placed in this encounter.  No orders of the defined types were placed in this encounter.    No chief complaint on file.    History of Present Illness:    Latoya Curry is a 87 y.o. female.  Patient has past medical history of essential hypertension, dyslipidemia and ascending thoracic aortic aneurysm.  This is followed by our surgical colleagues.  Patient denies any problems at this time and takes care of activities of daily living.  She ambulates with a cane.  Her family members are very supportive.  Her daughter  accompanies her for this visit.  At the time of my evaluation, the patient is alert awake oriented and in no distress.  She denies any chest pain or any such issues.  Past Medical History:  Diagnosis Date   Acute bronchitis 05/07/2020   Acute gangrenous appendicitis s/p lap appendectomy 06/24/2018 06/23/2018   Adjustment disorder 05/07/2020   Allergic bronchitis 05/07/2020   Allergic rhinitis 05/07/2020   Aortic regurgitation 05/10/2019   Aortic root enlargement ([place]) 08/19/2017   Appendicitis 05/07/2020   Ascending aorta dilation ([facility]) 08/19/2017   Benign paroxysmal positional vertigo 05/07/2020   Bronchitis 05/07/2020   Cardiac arrhythmia 05/07/2020   Chest discomfort 08/05/2017   Chest pain 05/07/2020   Chronic low back pain    Conjunctivitis of left eye 05/07/2020   Constipation 05/07/2020   Cough 09/23/2011   Followed in Pulmonary clinic/ [facility]/ [place]   - Sinus CT 09/25/2011 > neg    Diaphoresis 05/07/2020   Diarrhea 05/07/2020   Diverticular disease of colon 05/07/2020   Diverticulitis    Diverticulitis of colon 05/07/2020   DJD (degenerative joint disease) of knee    Eczema 05/07/2020   Encounter for general adult medical examination without abnormal findings 05/07/2020   Epistaxis 05/07/2020   Essential hypertension 08/05/2017   Fever 05/07/2020   Gastro-esophageal reflux disease without esophagitis 05/07/2020   Graves' disease 05/07/2020   Hearing loss 05/07/2020   History of diverticulitis 05/07/2020   Hypercholesteremia    Hypertension    Hyperthyroidism 05/07/2020   Hypokalemia 06/24/2018  Incarcerated incisional hernia s/p primary repair 06/24/2018 06/24/2018   Incontinence without sensory awareness 05/07/2020   Insomnia 05/07/2020   Left lower quadrant pain 05/07/2020   Loss of appetite 05/07/2020   Low back pain 05/07/2020   Mixed dyslipidemia 08/05/2017   Nasal crusting 05/07/2020   Neck pain 05/07/2020   Neurodermatitis    Noninflammatory disorder of vagina 05/07/2020   Obesity     Osteopenia    Other long term (current) drug therapy 05/07/2020   Pain in left hip 03/16/2019   Pain in right hand 05/07/2020   Postnasal drip 05/07/2020   Problem related to social environment 05/07/2020   Pure hypercholesterolemia 05/07/2020   PVC (premature ventricular contraction) 08/05/2017   Rash 05/07/2020   Recurrent UTI    Right hip pain 05/07/2020   Sciatica 05/07/2020   Skin sensation disturbance 05/07/2020   Sleep disorder 05/07/2020   Small bowel obstruction, partial, s/p lap adhesiolysis 06/24/2018 06/24/2018   Spasm 05/07/2020   Thoracic aortic aneurysm    Thoracic aortic aneurysm without rupture 05/07/2020   Thoracic aortic atherosclerosis ([place]) 01/11/2020   Thyrotoxicosis 05/07/2020   Transient global amnesia    Urinary tract infectious disease 05/07/2020   Viral syndrome 05/07/2020   Vitamin D deficiency 05/07/2020   Weight loss 05/07/2020    Past Surgical History:  Procedure Laterality Date   COLON RESECTION SIGMOID  2004   Dr Dalbert Batman   EXPLORATORY LAPAROTOMY  2004   r/o pancreas mass.  Dr Dalbert Batman   IR RADIOLOGIST EVAL & MGMT  07/14/2018   LAPAROSCOPIC APPENDECTOMY N/A 06/24/2018   Procedure: ACUTE GANGRENOUS APPENDICITIS WITH ABSCESS, PARTIAL SMALL BOWEL OBSTRUCTION, INCISIONAL HERNIA REPAIR, LYSIS OF ADHESIONS;  Surgeon: Michael Boston, MD;  Location: [facility];  Service: General;  Laterality: N/A;   TOTAL KNEE ARTHROPLASTY     bilaterallly    Current Medications: Current Meds  Medication Sig   amLODipine (NORVASC) 2.5 MG tablet Take 2.5 mg by mouth daily.   aspirin 81 MG tablet Take 81 mg by mouth every Monday, Wednesday, and Friday.   atenolol-chlorthalidone (TENORETIC) 50-25 MG per tablet Take 1 tablet by mouth daily.   Budeson-Glycopyrrol-Formoterol 160-9-4.8 MCG/ACT AERO Inhale 1 puff into the lungs as needed for wheezing or shortness of breath.   CANNABIDIOL PO Take 250 mg by mouth 2 (two) times daily.   cetirizine (ZYRTEC) 10 MG tablet Take 10 mg by mouth daily.    cholecalciferol (VITAMIN D) 1000 UNITS tablet Take 1,000 Units by mouth daily.   citalopram (CELEXA) 20 MG tablet Take 20 mg by mouth daily.   levothyroxine (SYNTHROID) 50 MCG tablet Take 50 mcg by mouth daily before breakfast.    Multiple Vitamin (MULTI VITAMIN) TABS Take 2 tablets by mouth daily.   omeprazole (PRILOSEC) 20 MG capsule Take 20 mg by mouth daily.   potassium chloride (K-DUR) 10 MEQ tablet Take 10 mEq by mouth daily.   rosuvastatin (CRESTOR) 5 MG tablet Take 5 mg by mouth every Monday, Wednesday, and Friday.   TOVIAZ 8 MG TB24 tablet Take 8 mg by mouth daily.   traMADol (ULTRAM) 50 MG tablet Take 50 mg by mouth every 12 (twelve) hours as needed for moderate pain.     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  Tobacco Use   Smoking status: Never   Smokeless tobacco: Never  Vaping Use   Vaping Use: Never used  Substance and Sexual Activity   Alcohol use: No   Drug use: No   Sexual activity: Not on file  Other Topics Concern   Not on file  Social History Narrative   Not on file   Social Determinants of Health   Financial Resource Strain: Not on file  Food Insecurity: Not on file  Transportation Needs: Not on file  Physical Activity: Not on file  Stress: Not on file  Social Connections: Not on file     Family History: The patient's family history includes Heart disease in her father; Leukemia in her mother.  ROS:   Please see the history of present illness.    All other systems reviewed and are negative.  EKGs/Labs/Other Studies Reviewed:    The following studies were reviewed today: I discussed my findings with the  patient including CT scan report from last time.   Recent Labs: No results found for requested labs within last 8760 hours.  Recent Lipid Panel    Component Value Date/Time   CHOL 151 08/25/2017 0910   TRIG 148 08/25/2017 0910   HDL 40 08/25/2017 0910   CHOLHDL 3.8 08/25/2017 0910   LDLCALC 81 08/25/2017 0910    Physical Exam:    VS:  BP 118/68   Pulse 72   Ht 5\' 6"  (1.676 m)   Wt 159 lb 1.9 oz (72.2 kg)   BMI 25.68 kg/m     Wt Readings from Last 3 Encounters:  12/25/20 159 lb 1.9 oz (72.2 kg)  05/08/20 159 lb 1.3 oz (72.2 kg)  01/11/20 159 lb 3.2 oz (72.2 kg)     GEN: Patient is in no acute distress HEENT: Normal NECK: No JVD; No carotid bruits LYMPHATICS: No lymphadenopathy CARDIAC: Hear sounds regular, 2/6 systolic murmur at the apex. RESPIRATORY:  Clear to auscultation without rales, wheezing or rhonchi  ABDOMEN: Soft, non-tender, non-distended MUSCULOSKELETAL:  No edema; No deformity  SKIN: Warm and dry NEUROLOGIC:  Alert and oriented x 3 PSYCHIATRIC:  Normal affect   Signed, Jenean Lindau, MD  12/25/2020 9:34 AM    [place]

## 2020-12-25 NOTE — Patient Instructions (Signed)
Medication Instructions:  Your physician recommends that you continue on your current medications as directed. Please refer to the Current Medication list given to you today.  *If you need a refill on your cardiac medications before your next appointment, please call your pharmacy*   Lab Work: Your physician recommends that you have labs done in the office today. Your test included  basic metabolic panel, complete blood count, TSH, liver function and lipids.  If you have labs (blood work) drawn today and your tests are completely normal, you will receive your results only by: MyChart Message (if you have MyChart) OR A paper copy in the mail If you have any lab test that is abnormal or we need to change your treatment, we will call you to review the results.   Testing/Procedures: None ordered   Follow-Up: At [facility], you and your health needs are our priority.  As part of our continuing mission to provide you with exceptional heart care, we have created designated Provider Care Teams.  These Care Teams include your primary Cardiologist (physician) and Advanced Practice Providers (APPs -  Physician Assistants and Nurse Practitioners) who all work together to provide you with the care you need, when you need it.  We recommend signing up for the patient portal called "MyChart".  Sign up information is provided on this After Visit Summary.  MyChart is used to connect with patients for Virtual Visits (Telemedicine).  Patients are able to view lab/test results, encounter notes, upcoming appointments, etc.  Non-urgent messages can be sent to your provider as well.   To learn more about what you can do with MyChart, go to [email or web address].    Your next appointment:   6 month(s)  The format for your next appointment:   In Person  Provider:   Rajan Revankar, MD   Other Instructions NA   

## 2020-12-26 ENCOUNTER — Telehealth: Payer: Self-pay

## 2020-12-26 LAB — CBC WITH DIFFERENTIAL/PLATELET
Basophils Absolute: 0.1 10*3/uL (ref 0.0–0.2)
Basos: 1 %
EOS (ABSOLUTE): 0.2 10*3/uL (ref 0.0–0.4)
Eos: 3 %
Hematocrit: 45.2 % (ref 34.0–46.6)
Hemoglobin: 15.6 g/dL (ref 11.1–15.9)
Immature Grans (Abs): 0 10*3/uL (ref 0.0–0.1)
Immature Granulocytes: 0 %
Lymphocytes Absolute: 1.9 10*3/uL (ref 0.7–3.1)
Lymphs: 26 %
MCH: 31.5 pg (ref 26.6–33.0)
MCHC: 34.5 g/dL (ref 31.5–35.7)
MCV: 91 fL (ref 79–97)
Monocytes Absolute: 0.6 10*3/uL (ref 0.1–0.9)
Monocytes: 8 %
Neutrophils Absolute: 4.7 10*3/uL (ref 1.4–7.0)
Neutrophils: 62 %
Platelets: 254 10*3/uL (ref 150–450)
RBC: 4.95 x10E6/uL (ref 3.77–5.28)
RDW: 12.3 % (ref 11.7–15.4)
WBC: 7.6 10*3/uL (ref 3.4–10.8)

## 2020-12-26 LAB — LIPID PANEL
Chol/HDL Ratio: 3.5 ratio (ref 0.0–4.4)
Cholesterol, Total: 177 mg/dL (ref 100–199)
HDL: 50 mg/dL (ref >39)
LDL Chol Calc (NIH): 103 mg/dL — ABNORMAL HIGH (ref 0–99)
Triglycerides: 135 mg/dL (ref 0–149)
VLDL Cholesterol Cal: 24 mg/dL (ref 5–40)

## 2020-12-26 LAB — HEPATIC FUNCTION PANEL
ALT: 12 IU/L (ref 0–32)
AST: 25 IU/L (ref 0–40)
Albumin: 4.6 g/dL (ref 3.6–4.6)
Alkaline Phosphatase: 59 IU/L (ref 44–121)
Bilirubin Total: 0.7 mg/dL (ref 0.0–1.2)
Bilirubin, Direct: 0.2 mg/dL (ref 0.00–0.40)
Total Protein: 7.1 g/dL (ref 6.0–8.5)

## 2020-12-26 LAB — BASIC METABOLIC PANEL
BUN/Creatinine Ratio: 16 (ref 12–28)
BUN: 15 mg/dL (ref 8–27)
CO2: 27 mmol/L (ref 20–29)
Calcium: 9.9 mg/dL (ref 8.7–10.3)
Chloride: 97 mmol/L (ref 96–106)
Creatinine, Ser: 0.93 mg/dL (ref 0.57–1.00)
Glucose: 97 mg/dL (ref 70–99)
Potassium: 3.3 mmol/L — ABNORMAL LOW (ref 3.5–5.2)
Sodium: 140 mmol/L (ref 134–144)
eGFR: 59 mL/min/{1.73_m2} — ABNORMAL LOW (ref >59)

## 2020-12-26 LAB — TSH: TSH: 6.58 u[IU]/mL — ABNORMAL HIGH (ref 0.450–4.500)

## 2020-12-26 NOTE — Telephone Encounter (Signed)
Patient's daughter was returning call. Please advise

## 2020-12-26 NOTE — Telephone Encounter (Signed)
Spoke to the patients daughter just now and let her know these recommendations. She states that the patient has not been taking her potassium at all but she will make her take it and they will come back in one week to get this rechecked.    Encouraged patient to call back with any questions or concerns.

## 2020-12-26 NOTE — Telephone Encounter (Signed)
Left message on patients voicemail to please return our call.   

## 2020-12-26 NOTE — Telephone Encounter (Signed)
-----   Message from Jenean Lindau, MD sent at 12/26/2020  2:56 PM EDT ----- Double potassium and recheck Chem-7 only in 1 week.  Labs are otherwise fine.  Mildly abnormal TSH.  The results of the study is unremarkable. Please inform patient. I will discuss in detail at next appointment. Cc  primary care/referring physician Jenean Lindau, MD 12/26/2020 2:56 PM

## 2021-01-09 ENCOUNTER — Other Ambulatory Visit: Payer: Medicare HMO

## 2021-01-09 ENCOUNTER — Ambulatory Visit: Payer: Medicare HMO | Admitting: Surgery

## 2021-01-21 DIAGNOSIS — J45909 Unspecified asthma, uncomplicated: Secondary | ICD-10-CM | POA: Diagnosis not present

## 2021-01-21 DIAGNOSIS — E7800 Pure hypercholesterolemia, unspecified: Secondary | ICD-10-CM | POA: Diagnosis not present

## 2021-01-21 DIAGNOSIS — I10 Essential (primary) hypertension: Secondary | ICD-10-CM | POA: Diagnosis not present

## 2021-01-21 DIAGNOSIS — K219 Gastro-esophageal reflux disease without esophagitis: Secondary | ICD-10-CM | POA: Diagnosis not present

## 2021-01-21 DIAGNOSIS — G4700 Insomnia, unspecified: Secondary | ICD-10-CM | POA: Diagnosis not present

## 2021-01-21 DIAGNOSIS — J40 Bronchitis, not specified as acute or chronic: Secondary | ICD-10-CM | POA: Diagnosis not present

## 2021-02-19 ENCOUNTER — Telehealth: Payer: Self-pay

## 2021-02-19 ENCOUNTER — Ambulatory Visit
Admission: RE | Admit: 2021-02-19 | Discharge: 2021-02-19 | Disposition: A | Discharge status: Home / Self Care | Payer: Medicare HMO | Source: Ambulatory Visit | Attending: Surgery | Admitting: Surgery

## 2021-02-19 DIAGNOSIS — J8410 Pulmonary fibrosis, unspecified: Secondary | ICD-10-CM | POA: Diagnosis not present

## 2021-02-19 DIAGNOSIS — I712 Thoracic aortic aneurysm, without rupture, unspecified: Secondary | ICD-10-CM | POA: Diagnosis not present

## 2021-02-19 DIAGNOSIS — I7121 Aneurysm of the ascending aorta, without rupture: Secondary | ICD-10-CM

## 2021-02-19 MED ORDER — IOPAMIDOL (ISOVUE-370) INJECTION 76%
75.0000 mL | Freq: Once | INTRAVENOUS | Status: AC | PRN
  Administered 2021-02-19: 12:00:00 75 mL via INTRAVENOUS

## 2021-02-19 NOTE — Telephone Encounter (Signed)
Erline Levine with [facility] contacted the office with call report for patient's recent CTA. Dr. Cyndia Bent made aware. Patient is to follow-up on results in the office 02/27/20.

## 2021-02-20 ENCOUNTER — Ambulatory Visit: Payer: Medicare HMO | Admitting: Surgery

## 2021-02-26 ENCOUNTER — Encounter: Payer: Self-pay | Admitting: Cardiothoracic Surgery

## 2021-02-26 ENCOUNTER — Ambulatory Visit: Payer: Medicare HMO | Admitting: Cardiothoracic Surgery

## 2021-02-26 ENCOUNTER — Other Ambulatory Visit: Payer: Self-pay

## 2021-02-26 VITALS — BP 137/78 | HR 83 | Resp 20 | Ht 66.0 in | Wt 159.0 lb

## 2021-02-26 DIAGNOSIS — I712 Thoracic aortic aneurysm, without rupture, unspecified: Secondary | ICD-10-CM | POA: Diagnosis not present

## 2021-02-26 NOTE — Progress Notes (Signed)
PCP is Josetta Huddle, MD Referring Provider is Josetta Huddle, MD  Chief Complaint  Patient presents with   Thoracic Aortic Aneurysm    Yearly f/u with CTA    HPI: The patient is a 88 year old female with mild-moderate hypertension well-controlled on low-dose amlodipine and atenolol who was found to have a sending thoracic aortic fusiform aneurysm is an incidental finding in 2019.  This has been asymptomatic.  It originally measured 5.3 cm in diameter.  Her CT scan a year ago showed no significant change.  I personally reviewed the CTA of her thoracic aorta which was performed last week December 2022 which shows a very slight enlargement of the diameter of the ascending aorta now to 5.5 cm.  She remains asymptomatic .  She has been seen twice in the past 9 months by her cardiologist Dr. Geraldo Pitter with blood pressure measuring 101 systolic.  Today I personally measured her blood pressure which was 751-025 mm systolic.  The patient has an associated 2+ mild aortic insufficiency by echo in 2019 which is stable and asymptomatic.  She denies symptoms of angina or CHF.  Patient is able to walk in the hallway of her assisted living facility once or twice a day.  She remains in sinus rhythm.  Past Medical History:  Diagnosis Date   Acute bronchitis 05/07/2020   Acute gangrenous appendicitis s/p lap appendectomy 06/24/2018 06/23/2018   Adjustment disorder 05/07/2020   Allergic bronchitis 05/07/2020   Allergic rhinitis 05/07/2020   Aortic regurgitation 05/10/2019   Aortic root enlargement ([place]) 08/19/2017   Appendicitis 05/07/2020   Ascending aorta dilation ([facility]) 08/19/2017   Benign paroxysmal positional vertigo 05/07/2020   Bronchitis 05/07/2020   Cardiac arrhythmia 05/07/2020   Chest discomfort 08/05/2017   Chest pain 05/07/2020   Chronic low back pain    Conjunctivitis of left eye 05/07/2020   Constipation 05/07/2020   Cough 09/23/2011   Followed in Pulmonary clinic/ [facility]/ [facility]   - Sinus CT  09/25/2011 > neg    Diaphoresis 05/07/2020   Diarrhea 05/07/2020   Diverticular disease of colon 05/07/2020   Diverticulitis    Diverticulitis of colon 05/07/2020   DJD (degenerative joint disease) of knee    Eczema 05/07/2020   Encounter for general adult medical examination without abnormal findings 05/07/2020   Epistaxis 05/07/2020   Essential hypertension 08/05/2017   Fever 05/07/2020   Gastro-esophageal reflux disease without esophagitis 05/07/2020   Graves' disease 05/07/2020   Hearing loss 05/07/2020   History of diverticulitis 05/07/2020   Hypercholesteremia    Hypertension    Hyperthyroidism 05/07/2020   Hypokalemia 06/24/2018   Incarcerated incisional hernia s/p primary repair 06/24/2018 06/24/2018   Incontinence without sensory awareness 05/07/2020   Insomnia 05/07/2020   Left lower quadrant pain 05/07/2020   Loss of appetite 05/07/2020   Low back pain 05/07/2020   Mixed dyslipidemia 08/05/2017   Nasal crusting 05/07/2020   Neck pain 05/07/2020   Neurodermatitis    Noninflammatory disorder of vagina 05/07/2020   Obesity    Osteopenia    Other long term (current) drug therapy 05/07/2020   Pain in left hip 03/16/2019   Pain in right hand 05/07/2020   Postnasal drip 05/07/2020   Problem related to social environment 05/07/2020   Pure hypercholesterolemia 05/07/2020   PVC (premature ventricular contraction) 08/05/2017   Rash 05/07/2020   Recurrent UTI    Right hip pain 05/07/2020   Sciatica 05/07/2020   Skin sensation disturbance 05/07/2020   Sleep disorder 05/07/2020  Small bowel obstruction, partial, s/p lap adhesiolysis 06/24/2018 06/24/2018   Spasm 05/07/2020   Thoracic aortic aneurysm    Thoracic aortic aneurysm without rupture 05/07/2020   Thoracic aortic atherosclerosis ([place]) 01/11/2020   Thyrotoxicosis 05/07/2020   Transient global amnesia    Urinary tract infectious disease 05/07/2020   Viral syndrome 05/07/2020   Vitamin D deficiency 05/07/2020   Weight loss 05/07/2020    Past Surgical  History:  Procedure Laterality Date   COLON RESECTION SIGMOID  2004   Dr Dalbert Batman   EXPLORATORY LAPAROTOMY  2004   r/o pancreas mass.  Dr Dalbert Batman   IR RADIOLOGIST EVAL & MGMT  07/14/2018   LAPAROSCOPIC APPENDECTOMY N/A 06/24/2018   Procedure: ACUTE GANGRENOUS APPENDICITIS WITH ABSCESS, PARTIAL SMALL BOWEL OBSTRUCTION, INCISIONAL HERNIA REPAIR, LYSIS OF ADHESIONS;  Surgeon: Michael Boston, MD;  Location: [facility];  Service: General;  Laterality: N/A;   TOTAL KNEE ARTHROPLASTY     bilaterallly    Family History  Problem Relation Age of Onset   Heart disease Father    Leukemia Mother     Social History Social History   Tobacco Use   Smoking status: Never   Smokeless tobacco: Never  Vaping Use   Vaping Use: Never used  Substance Use Topics   Alcohol use: No   Drug use: No    Current Outpatient Medications  Medication Sig Dispense Refill   amLODipine (NORVASC) 2.5 MG tablet Take 2.5 mg by mouth daily.     aspirin 81 MG tablet Take 81 mg by mouth every Monday, Wednesday, and Friday.     atenolol-chlorthalidone (TENORETIC) 50-25 MG per tablet Take 1 tablet by mouth daily.     Budeson-Glycopyrrol-Formoterol 160-9-4.8 MCG/ACT AERO Inhale 1 puff into the lungs as needed for wheezing or shortness of breath.     CANNABIDIOL PO Take 250 mg by mouth 2 (two) times daily.     cetirizine (ZYRTEC) 10 MG tablet Take 10 mg by mouth daily.     cholecalciferol (VITAMIN D) 1000 UNITS tablet Take 1,000 Units by mouth daily.     citalopram (CELEXA) 20 MG tablet Take 20 mg by mouth daily.     levothyroxine (SYNTHROID) 50 MCG tablet Take 50 mcg by mouth daily before breakfast.      Multiple Vitamin (MULTI VITAMIN) TABS Take 2 tablets by mouth daily.     omeprazole (PRILOSEC) 20 MG capsule Take 20 mg by mouth daily.     potassium chloride (K-DUR) 10 MEQ tablet Take 10 mEq by mouth daily.     rosuvastatin (CRESTOR) 5 MG tablet Take 5 mg by mouth every Monday, Wednesday, and Friday.     TOVIAZ 8 MG TB24  tablet Take 8 mg by mouth daily.     traMADol (ULTRAM) 50 MG tablet Take 50 mg by mouth every 12 (twelve) hours as needed for moderate pain.     No current facility-administered medications for this visit.    Allergies  Allergen Reactions   Codeine Nausea And Vomiting   Levofloxacin     Other reaction(s): cardiac advised avoid/aneursym   Simvastatin Other (See Comments)    dizziness   Sulfamethoxazole-Trimethoprim Other (See Comments)     dyspepsia   Zestril [Lisinopril] Cough    Review of Systems Weight stable no upper back or chest pain No falls No change in vision or speech No ankle edema  Mild shortness of breath with ambulation but no orthopnea or PND  BP 137/78 (BP Location: Left Arm, Patient Position: Sitting, Cuff  Size: Normal)    Pulse 83    Resp 20    Ht 5\' 6"  (1.676 m)    Wt 159 lb (72.1 kg)    SpO2 90% Comment: RA   BMI 25.66 kg/m  Physical Exam Blood pressure 115/70 heart rate 84 Lungs clear No carotid bruit Heart rate regular sinus rhythm No significant AI murmur or gallop Minimal ankle edema with chronic varicose veins bilaterally No focal motor deficit  Diagnostic Tests: CTA thoracic aorta images personally reviewed performed December 2022.  These show a persistent fusiform ascending aortic aneurysm measuring 5.5 cm in diameter.  No evidence of mural thrombus or ulceration.  Impression: Patient will turn 34 in about 2 months and has a stable ascending aneurysm which is asymptomatic.  Because of the patient's advanced age and comorbid condition she is not a surgical candidate for repair of her thoracic aortic aneurysm.  Her risk for aortic tear/dissection remains about 10% and this was discussed with the patient and she understands.  The best preventative measure his blood pressure control monitoring.  She wishes to return for a follow-up visit in a year with a CTA to help prognosticate the risk of an aortic dissection which she understands would be  fatal.  Plan: Return in 1 year with CTA of the thoracic aorta Continue blood pressure monitoring at home Avoid Cipro/Levaquin as fluoroquinolones can increase the risk of dissection of a thoracic aneurysm.  Dahlia Byes, MD [facility] [phone]

## 2021-03-05 DIAGNOSIS — G4700 Insomnia, unspecified: Secondary | ICD-10-CM | POA: Diagnosis not present

## 2021-03-05 DIAGNOSIS — E7800 Pure hypercholesterolemia, unspecified: Secondary | ICD-10-CM | POA: Diagnosis not present

## 2021-03-05 DIAGNOSIS — J40 Bronchitis, not specified as acute or chronic: Secondary | ICD-10-CM | POA: Diagnosis not present

## 2021-03-05 DIAGNOSIS — J45909 Unspecified asthma, uncomplicated: Secondary | ICD-10-CM | POA: Diagnosis not present

## 2021-03-05 DIAGNOSIS — E0590 Thyrotoxicosis, unspecified without thyrotoxic crisis or storm: Secondary | ICD-10-CM | POA: Diagnosis not present

## 2021-03-05 DIAGNOSIS — I10 Essential (primary) hypertension: Secondary | ICD-10-CM | POA: Diagnosis not present

## 2021-03-05 DIAGNOSIS — K219 Gastro-esophageal reflux disease without esophagitis: Secondary | ICD-10-CM | POA: Diagnosis not present

## 2021-03-25 DIAGNOSIS — I10 Essential (primary) hypertension: Secondary | ICD-10-CM | POA: Diagnosis not present

## 2021-03-25 DIAGNOSIS — M5432 Sciatica, left side: Secondary | ICD-10-CM | POA: Diagnosis not present

## 2021-03-25 DIAGNOSIS — R0989 Other specified symptoms and signs involving the circulatory and respiratory systems: Secondary | ICD-10-CM | POA: Diagnosis not present

## 2021-03-25 DIAGNOSIS — H9193 Unspecified hearing loss, bilateral: Secondary | ICD-10-CM | POA: Diagnosis not present

## 2021-03-25 DIAGNOSIS — E559 Vitamin D deficiency, unspecified: Secondary | ICD-10-CM | POA: Diagnosis not present

## 2021-03-25 DIAGNOSIS — R32 Unspecified urinary incontinence: Secondary | ICD-10-CM | POA: Diagnosis not present

## 2021-03-25 DIAGNOSIS — E0500 Thyrotoxicosis with diffuse goiter without thyrotoxic crisis or storm: Secondary | ICD-10-CM | POA: Diagnosis not present

## 2021-03-25 DIAGNOSIS — Z1389 Encounter for screening for other disorder: Secondary | ICD-10-CM | POA: Diagnosis not present

## 2021-03-25 DIAGNOSIS — M5451 Vertebrogenic low back pain: Secondary | ICD-10-CM | POA: Diagnosis not present

## 2021-03-25 DIAGNOSIS — I351 Nonrheumatic aortic (valve) insufficiency: Secondary | ICD-10-CM | POA: Diagnosis not present

## 2021-03-25 DIAGNOSIS — Z0000 Encounter for general adult medical examination without abnormal findings: Secondary | ICD-10-CM | POA: Diagnosis not present

## 2021-03-25 DIAGNOSIS — E7800 Pure hypercholesterolemia, unspecified: Secondary | ICD-10-CM | POA: Diagnosis not present

## 2021-03-25 DIAGNOSIS — K219 Gastro-esophageal reflux disease without esophagitis: Secondary | ICD-10-CM | POA: Diagnosis not present

## 2021-03-25 LAB — BASIC METABOLIC PANEL
BUN: 20 (ref 4–21)
CO2: 34 — AB (ref 13–22)
Chloride: 98 — AB (ref 99–108)
Creatinine: 0.9 (ref <=1.1)
Glucose: 91
Potassium: 3.6 mEq/L (ref 3.5–5.1)
Sodium: 140 (ref 137–147)

## 2021-03-25 LAB — HEPATIC FUNCTION PANEL
ALT: 14 U/L (ref 7–35)
AST: 25 (ref 13–35)
Alkaline Phosphatase: 51 (ref 25–125)
Bilirubin, Total: 1.1

## 2021-03-25 LAB — LIPID PANEL
Cholesterol: 181 (ref 0–200)
HDL: 56 (ref 35–70)
LDL Cholesterol: 104
Triglycerides: 119 (ref 40–160)

## 2021-03-25 LAB — CBC AND DIFFERENTIAL
HCT: 45 (ref 36–46)
Hemoglobin: 15.2 (ref 12.0–16.0)
Platelets: 260 10*3/uL (ref 150–400)
WBC: 9.3

## 2021-03-25 LAB — COMPREHENSIVE METABOLIC PANEL
Albumin: 4.5 (ref 3.5–5.0)
Calcium: 10.3 (ref 8.7–10.7)
eGFR: 61

## 2021-03-25 LAB — VITAMIN D 25 HYDROXY (VIT D DEFICIENCY, FRACTURES): Vit D, 25-Hydroxy: 29.9

## 2021-03-25 LAB — CBC: RBC: 4.88 (ref 3.87–5.11)

## 2021-03-25 LAB — TSH: TSH: 6.06 — AB (ref <=5.90)

## 2021-04-16 ENCOUNTER — Telehealth: Payer: Self-pay

## 2021-04-16 NOTE — Telephone Encounter (Signed)
Patient contacted the office concerned about a dental issue and inquired if she could get a tooth pulled if needed. Advised that if there is dental problems with the need for a removal of a tooth it is strongly advised. She acknowledged receipt.

## 2021-04-22 DIAGNOSIS — R0989 Other specified symptoms and signs involving the circulatory and respiratory systems: Secondary | ICD-10-CM | POA: Diagnosis not present

## 2021-04-22 DIAGNOSIS — E559 Vitamin D deficiency, unspecified: Secondary | ICD-10-CM | POA: Diagnosis not present

## 2021-04-22 DIAGNOSIS — I10 Essential (primary) hypertension: Secondary | ICD-10-CM | POA: Diagnosis not present

## 2021-04-22 DIAGNOSIS — E0500 Thyrotoxicosis with diffuse goiter without thyrotoxic crisis or storm: Secondary | ICD-10-CM | POA: Diagnosis not present

## 2021-04-22 DIAGNOSIS — R32 Unspecified urinary incontinence: Secondary | ICD-10-CM | POA: Diagnosis not present

## 2021-04-22 DIAGNOSIS — M5432 Sciatica, left side: Secondary | ICD-10-CM | POA: Diagnosis not present

## 2021-05-01 DIAGNOSIS — H04123 Dry eye syndrome of bilateral lacrimal glands: Secondary | ICD-10-CM | POA: Diagnosis not present

## 2021-05-01 DIAGNOSIS — Z961 Presence of intraocular lens: Secondary | ICD-10-CM | POA: Diagnosis not present

## 2021-05-08 DIAGNOSIS — N8111 Cystocele, midline: Secondary | ICD-10-CM | POA: Diagnosis not present

## 2021-07-08 ENCOUNTER — Encounter: Payer: Self-pay | Admitting: Family Medicine

## 2021-07-08 ENCOUNTER — Ambulatory Visit (INDEPENDENT_AMBULATORY_CARE_PROVIDER_SITE_OTHER): Payer: Medicare HMO | Admitting: Family Medicine

## 2021-07-08 VITALS — BP 117/64 | HR 69 | Ht 66.0 in | Wt 159.4 lb

## 2021-07-08 DIAGNOSIS — E039 Hypothyroidism, unspecified: Secondary | ICD-10-CM | POA: Diagnosis not present

## 2021-07-08 DIAGNOSIS — E559 Vitamin D deficiency, unspecified: Secondary | ICD-10-CM | POA: Diagnosis not present

## 2021-07-08 DIAGNOSIS — E7800 Pure hypercholesterolemia, unspecified: Secondary | ICD-10-CM | POA: Diagnosis not present

## 2021-07-08 DIAGNOSIS — I10 Essential (primary) hypertension: Secondary | ICD-10-CM | POA: Diagnosis not present

## 2021-07-08 NOTE — Progress Notes (Signed)
? ?New Patient Office Visit ? ?Subjective   ? ?Patient ID: Latoya Curry, female    DOB: 19-Feb-1931  Age: 88 y.o. MRN: [id number] ? ?CC: establish care, no complaints  ? ? ?HPI ?[name] presents to establish care. Her PCP is retiring and needs a new PCP.  ? ?HTN and Aortic Aneurysm - She follows with [facility] once per year. She also sees Dr. Nils Pyle for aortic aneurysm monitoring (CT scan/appointment once a year). Reports HTN has been well controlled for years. Goal <140 SBP d/t aneurysm. PCP recently heard crackles on right lung and was afraid she might be developing CHF. Started her on lasix (three times per week), and then crackles were resolved. No new symptoms.  ? ?Depression/anxiety - Taking Celexa for years. Reports her mood is good. No concerns. No SI/HI. She also takes CBD liquid twice a day also.  ? ?Hypothyroidism (acquired) - Levothyroxine 50 mcg (Hx of Grave's disease/thyrotoxicosis - treated with radiation). No symptoms.  ? ?Hypokalemia - Patient reports long history of low potassium. She takes K-Dur 10 mEq BID ? ?HLD - Crestor 5 mg 3x/week. Tries to watch diet and stay active.  ? ?Hx of allergic bronchitis - Breztri (uses as needed). No recent problems. States she has never been diagnosed with COPD. ? ? ?Outpatient Encounter Medications as of 07/08/2021  ?Medication Sig  ? amLODipine (NORVASC) 2.5 MG tablet Take 2.5 mg by mouth daily.  ? aspirin 81 MG tablet Take 81 mg by mouth every Monday, Wednesday, and Friday.  ? atenolol-chlorthalidone (TENORETIC) 50-25 MG per tablet Take 1 tablet by mouth daily.  ? citalopram (CELEXA) 20 MG tablet Take 20 mg by mouth daily.  ? levothyroxine (SYNTHROID) 50 MCG tablet Take 50 mcg by mouth daily before breakfast.   ? Multiple Vitamin (MULTI VITAMIN) TABS Take 2 tablets by mouth daily.  ? omeprazole (PRILOSEC) 20 MG capsule Take 20 mg by mouth daily.  ? potassium chloride (K-DUR) 10 MEQ tablet Take 10 mEq by mouth daily.  ? rosuvastatin  (CRESTOR) 5 MG tablet Take 5 mg by mouth every Monday, Wednesday, and Friday.  ? TOVIAZ 8 MG TB24 tablet Take 8 mg by mouth daily.  ? traMADol (ULTRAM) 50 MG tablet Take 50 mg by mouth every 12 (twelve) hours as needed for moderate pain.  ? Budeson-Glycopyrrol-Formoterol 160-9-4.8 MCG/ACT AERO Inhale 1 puff into the lungs as needed for wheezing or shortness of breath.  ? CANNABIDIOL PO Take 250 mg by mouth 2 (two) times daily.  ? cetirizine (ZYRTEC) 10 MG tablet Take 10 mg by mouth daily.  ? cholecalciferol (VITAMIN D) 1000 UNITS tablet Take 1,000 Units by mouth daily.  ? furosemide (LASIX) 20 MG tablet Take by mouth.  ? ?No facility-administered encounter medications on file as of 07/08/2021.  ? ? ?Past Medical History:  ?Diagnosis Date  ? Acute bronchitis 05/07/2020  ? Acute gangrenous appendicitis s/p lap appendectomy 06/24/2018 06/23/2018  ? Adjustment disorder 05/07/2020  ? Allergic bronchitis 05/07/2020  ? Allergic rhinitis 05/07/2020  ? Aortic regurgitation 05/10/2019  ? Aortic root enlargement ([facility]) 08/19/2017  ? Appendicitis 05/07/2020  ? Ascending aorta dilation ([place]) 08/19/2017  ? Benign paroxysmal positional vertigo 05/07/2020  ? Bronchitis 05/07/2020  ? Cardiac arrhythmia 05/07/2020  ? Chest discomfort 08/05/2017  ? Chest pain 05/07/2020  ? Chronic low back pain   ? Conjunctivitis of left eye 05/07/2020  ? Constipation 05/07/2020  ? Cough 09/23/2011  ? Followed in Pulmonary clinic/ [facility]/ [place]   -  Sinus CT 09/25/2011 > neg   ? Diaphoresis 05/07/2020  ? Diarrhea 05/07/2020  ? Diverticular disease of colon 05/07/2020  ? Diverticulitis   ? Diverticulitis of colon 05/07/2020  ? DJD (degenerative joint disease) of knee   ? Eczema 05/07/2020  ? Encounter for general adult medical examination without abnormal findings 05/07/2020  ? Epistaxis 05/07/2020  ? Essential hypertension 08/05/2017  ? Fever 05/07/2020  ? Gastro-esophageal reflux disease without esophagitis 05/07/2020  ? Graves' disease 05/07/2020  ? Hearing loss 05/07/2020   ? History of diverticulitis 05/07/2020  ? Hypercholesteremia   ? Hypertension   ? Hyperthyroidism 05/07/2020  ? Hypokalemia 06/24/2018  ? Incarcerated incisional hernia s/p primary repair 06/24/2018 06/24/2018  ? Incontinence without sensory awareness 05/07/2020  ? Insomnia 05/07/2020  ? Left lower quadrant pain 05/07/2020  ? Loss of appetite 05/07/2020  ? Low back pain 05/07/2020  ? Mixed dyslipidemia 08/05/2017  ? Nasal crusting 05/07/2020  ? Neck pain 05/07/2020  ? Neurodermatitis   ? Noninflammatory disorder of vagina 05/07/2020  ? Obesity   ? Osteopenia   ? Other long term (current) drug therapy 05/07/2020  ? Pain in left hip 03/16/2019  ? Pain in right hand 05/07/2020  ? Postnasal drip 05/07/2020  ? Problem related to social environment 05/07/2020  ? Pure hypercholesterolemia 05/07/2020  ? PVC (premature ventricular contraction) 08/05/2017  ? Rash 05/07/2020  ? Recurrent UTI   ? Right hip pain 05/07/2020  ? Sciatica 05/07/2020  ? Skin sensation disturbance 05/07/2020  ? Sleep disorder 05/07/2020  ? Small bowel obstruction, partial, s/p lap adhesiolysis 06/24/2018 06/24/2018  ? Spasm 05/07/2020  ? Thoracic aortic aneurysm ([place])   ? Thoracic aortic aneurysm without rupture ([place]) 05/07/2020  ? Thoracic aortic atherosclerosis ([place]) 01/11/2020  ? Thyrotoxicosis 05/07/2020  ? Transient global amnesia   ? Urinary tract infectious disease 05/07/2020  ? Viral syndrome 05/07/2020  ? Vitamin D deficiency 05/07/2020  ? Weight loss 05/07/2020  ? ? ?Past Surgical History:  ?Procedure Laterality Date  ? APPENDECTOMY    ? COLON RESECTION SIGMOID  2004  ? Dr Dalbert Batman  ? EXPLORATORY LAPAROTOMY  2004  ? r/o pancreas mass.  Dr Dalbert Batman  ? IR RADIOLOGIST EVAL & MGMT  07/14/2018  ? LAPAROSCOPIC APPENDECTOMY N/A 06/24/2018  ? Procedure: ACUTE GANGRENOUS APPENDICITIS WITH ABSCESS, PARTIAL SMALL BOWEL OBSTRUCTION, INCISIONAL HERNIA REPAIR, LYSIS OF ADHESIONS;  Surgeon: Michael Boston, MD;  Location: [facility];  Service: General;  Laterality: N/A;  ? TOTAL KNEE ARTHROPLASTY     ? bilaterallly  ? ? ?Family History  ?Problem Relation Age of Onset  ? Heart disease Father   ? Leukemia Mother   ? ? ?Social History  ? ?Socioeconomic History  ? Marital status: Widowed  ?  Spouse name: Not on file  ? Number of children: 3  ? Years of education: Not on file  ? Highest education level: Not on file  ?Occupational History  ? Occupation: Retired  ?  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 Smoking status: Never  ? Smokeless tobacco: Never  ?Vaping Use  ? Vaping Use: Never used  ?Substance and Sexual Activity  ? Alcohol use: No  ? Drug use: Never  ? Sexual activity: Not on file  ?Other Topics Concern  ? Not on file  ?Social History Narrative  ? Not on file  ? ?Social Determinants of Health  ? ?Financial Resource Strain: Not on file  ?Food Insecurity: Not on file  ?Transportation Needs: Not on file  ?  Physical Activity: Not on file  ?Stress: Not on file  ?Social Connections: Not on file  ?Intimate Partner Violence: Not on file  ? ? ?ROS ?All review of systems negative except what is listed in the HPI ? ?  ? ? ?Objective   ? ?BP 117/64   Pulse 69   Ht '5\' 6"'$  (1.676 m)   Wt 159 lb 6.4 oz (72.3 kg)   BMI 25.73 kg/m?  ? ?Physical Exam ?Vitals reviewed.  ?Constitutional:   ?   Appearance: Normal appearance.  ?HENT:  ?   Head: Normocephalic and atraumatic.  ?Cardiovascular:  ?   Rate and Rhythm: Normal rate and regular rhythm.  ?Pulmonary:  ?   Effort: Pulmonary effort is normal.  ?   Breath sounds: Normal breath sounds. No wheezing, rhonchi or rales.  ?Musculoskeletal:  ?   Cervical back: Normal range of motion and neck supple.  ?   Right lower leg: No edema.  ?   Left lower leg: No edema.  ?Skin: ?   General: Skin is warm and dry.  ?Neurological:  ?   General: No focal deficit present.  ?   Mental Status: She is alert and oriented to person, place, and time. Mental status is at baseline.  ?Psychiatric:     ?   Mood and Affect: Mood normal.     ?   Behavior: Behavior normal.     ?   Thought Content:  Thought content normal.     ?   Judgment: Judgment normal.  ? ? ? ?  ? ?Assessment & Plan:  ? ?1. Vitamin D deficiency ?Continue daily supplementation for now. Will recheck labs today.  ?- VITAMIN D 25 Hydroxy

## 2021-07-08 NOTE — Patient Instructions (Signed)
Thank you for choosing [facility] for your Primary Care needs. I am excited for the opportunity to partner with you to meet your health care goals. It was a pleasure meeting you today! ? ?Recommendations from today's visit: ?- Please schedule a complete physical and annual wellness exam at your convenience (check with your insurance to make sure at least 1 year from last PCP)s ? ?Information on diet, exercise, and health maintenance recommendations are listed below. This is information to help you be sure you are on track for optimal health and monitoring.  ? ?Please look over this and let us know if you have any questions or if you have completed any of the health maintenance outside of [place] so that we can be sure your records are up to date.  ?___________________________________________________________ ? ?MyChart:  ?For all urgent or time sensitive needs we ask that you please call the office to avoid delays. Our number is [phone]. ?MyChart is not constantly monitored and due to the large volume of messages a day, replies may take up to 72 business hours. ? ?MyChart Policy: ?MyChart allows for you to see your visit notes, after visit summary, provider recommendations, lab and tests results, make an appointment, request refills, and contact your provider or the office for non-urgent questions or concerns. Providers are seeing patients during normal business hours and do not have built in time to review MyChart messages.  ?We ask that you allow a minimum of 3 business days for responses to [employer]. For this reason, please do not send urgent requests through [place]. Please call the office at [phone]. ?New and ongoing conditions may require a visit. We have virtual and in-person visits available for your convenience.  ?Complex MyChart concerns may require a visit. Your provider may request you schedule a virtual or in-person visit to ensure we are  providing the best care possible. ?MyChart messages sent after 11:00 AM on Friday will not be received by the provider until Monday morning.  ?  ?Lab and Test Results: ?You will receive your lab and test results on MyChart as soon as they are completed and results have been sent by the lab or testing facility. Due to this service, you will receive your results BEFORE your provider.  ?I review lab and test results each morning prior to seeing patients. Some results require collaboration with other providers to ensure you are receiving the most appropriate care. For this reason, we ask that you please allow a minimum of 3-5 business days from the time that ALL results have been received for your provider to receive and review lab and test results and contact you about these.  ?Most lab and test result comments from the provider will be sent through [place]. Your provider may recommend changes to the plan of care, follow-up visits, repeat testing, ask questions, or request an office visit to discuss these results. You may reply directly to this message or call the office to provide information for the provider or set up an appointment. ?In some instances, you will be called with test results and recommendations. Please let us know if this is preferred and we will make note of this in your chart to provide this for you.    ?If you have not heard a response to your lab or test results in 5 business days from all results returning to [place], please call the office to let us know. We ask that you please avoid  calling prior to this time unless there is an emergent concern. Due to high call volumes, this can delay the resulting process. ? ?After Hours: ?For all non-emergency after hours needs, please call the office at [phone] and select the option to reach the on-call  service. On-call services are shared between multiple [place] offices and therefore it will not be possible to speak directly with your provider.  On-call providers may provide medical advice and recommendations, but are unable to provide refills for maintenance medications.  ?For all emergency or urgent medical needs after normal business hours, we recommend that you seek care at the closest Urgent Care or Emergency Department to ensure appropriate treatment in a timely manner.  ?[facility] [place] has a 24 hour emergency room located on the ground floor for your convenience.  ? ?Urgent Concerns During the Business Day ?Providers are seeing patients from 8AM to [place] with a busy schedule and are most often not able to respond to non-urgent calls until the end of the day or the next business day. ?If you should have URGENT concerns during the day, please call and speak to the nurse or schedule a same day appointment so that we can address your concern without delay.  ? ?Thank you, again, for choosing me as your health care partner. I appreciate your trust and look forward to learning more about you.  ? ?Purcell Nails. Olevia Bowens, DNP, FNP-C ? ?___________________________________________________________ ? ?Health Maintenance Recommendations ?Screening Testing ?Mammogram ?Every 1-2 years based on history and risk factors ?Starting at age 63 ?Pap Smear ?Ages 21-39 every 3 years ?Ages 85-65 every 5 years with HPV testing ?More frequent testing may be required based on results and history ?Colon Cancer Screening ?Every 1-10 years based on test performed, risk factors, and history ?Starting at age 72 ?Bone Density Screening ?Every 2-10 years based on history ?Starting at age 72 for women ?Recommendations for men differ based on medication usage, history, and risk factors ?AAA Screening ?One time ultrasound ?Men 37-58 years old who have ever smoked ?Lung Cancer Screening ?Low Dose Lung CT every 12 months ?Age 80-80 years with a 20 pack-year smoking history who still smoke or who have quit within the last 15 years ? ?Screening Labs ?Routine  Labs: Complete Blood  Count (CBC), Complete Metabolic Panel (CMP), Cholesterol (Lipid Panel) ?Every 6-12 months based on history and medications ?May be recommended more frequently based on current conditions or previous results ?Hemoglobin A1c Lab ?Every 3-12 months based on history and previous results ?Starting at age 60 or earlier with diagnosis of diabetes, high cholesterol, BMI >26, and/or risk factors ?Frequent monitoring for patients with diabetes to ensure blood sugar control ?Thyroid Panel (TSH w/ T3 & T4) ?Every 6 months based on history, symptoms, and risk factors ?May be repeated more often if on medication ?HIV ?One time testing for all patients 60 and older ?May be repeated more frequently for patients with increased risk factors or exposure ?Hepatitis C ?One time testing for all patients 54 and older ?May be repeated more frequently for patients with increased risk factors or exposure ?Gonorrhea, Chlamydia ?Every 12 months for all sexually active persons 13-24 years ?Additional monitoring may be recommended for those who are considered high risk or who have symptoms ?PSA ?Men 44-73 years old with risk factors ?Additional screening may be recommended from age 57-69 based on risk factors, symptoms, and history ? ?Vaccine Recommendations ?Tetanus Booster ?All adults every 10 years ?Flu Vaccine ?All patients 6 months and  older every year ?COVID Vaccine ?All patients 12 years and older ?Initial dosing with booster ?May recommend additional booster based on age and health history ?HPV Vaccine ?2 doses all patients age 20-26 ?Dosing may be considered for patients over 26 ?Shingles Vaccine (Shingrix) ?2 doses all adults 50 years and older ?Pneumonia (Pneumovax 23) ?All adults 33 years and older ?May recommend earlier dosing based on health history ?Pneumonia (Prevnar 80) ?All adults 44 years and older ?Dosed 1 year after Pneumovax 23 ?Pneumonia (Prevnar 19) ?All adults 54 years and older (adults 76-22 with certain conditions or  risk factors) ?1 dose  ?For those who have no received Prevnar 13 vaccine previously ? ? ?Additional Screening, Testing, and Vaccinations may be recommended on an individualized basis based on family history, heal

## 2021-07-09 ENCOUNTER — Other Ambulatory Visit: Payer: Self-pay | Admitting: Family Medicine

## 2021-07-09 ENCOUNTER — Ambulatory Visit: Payer: Medicare HMO | Admitting: Cardiology

## 2021-07-09 DIAGNOSIS — E876 Hypokalemia: Secondary | ICD-10-CM

## 2021-07-09 LAB — LIPID PANEL
Cholesterol: 169 mg/dL (ref 0–200)
HDL: 47.4 mg/dL (ref >39.00)
LDL Cholesterol: 91 mg/dL (ref 0–99)
NonHDL: 121.95
Total CHOL/HDL Ratio: 4
Triglycerides: 156 mg/dL — ABNORMAL HIGH (ref 0.0–149.0)
VLDL: 31.2 mg/dL (ref 0.0–40.0)

## 2021-07-09 LAB — COMPREHENSIVE METABOLIC PANEL
ALT: 12 U/L (ref 0–35)
AST: 20 U/L (ref 0–37)
Albumin: 4.4 g/dL (ref 3.5–5.2)
Alkaline Phosphatase: 49 U/L (ref 39–117)
BUN: 25 mg/dL — ABNORMAL HIGH (ref 6–23)
CO2: 34 mEq/L — ABNORMAL HIGH (ref 19–32)
Calcium: 9.8 mg/dL (ref 8.4–10.5)
Chloride: 96 mEq/L (ref 96–112)
Creatinine, Ser: 1.01 mg/dL (ref 0.40–1.20)
GFR: 49.45 mL/min — ABNORMAL LOW (ref >60.00)
Glucose, Bld: 92 mg/dL (ref 70–99)
Potassium: 3.2 mEq/L — ABNORMAL LOW (ref 3.5–5.1)
Sodium: 139 mEq/L (ref 135–145)
Total Bilirubin: 0.7 mg/dL (ref 0.2–1.2)
Total Protein: 7 g/dL (ref 6.0–8.3)

## 2021-07-09 LAB — CBC
HCT: 43 % (ref 36.0–46.0)
Hemoglobin: 14.5 g/dL (ref 12.0–15.0)
MCHC: 33.7 g/dL (ref 30.0–36.0)
MCV: 93.8 fl (ref 78.0–100.0)
Platelets: 239 10*3/uL (ref 150.0–400.0)
RBC: 4.59 Mil/uL (ref 3.87–5.11)
RDW: 13.7 % (ref 11.5–15.5)
WBC: 9.2 10*3/uL (ref 4.0–10.5)

## 2021-07-09 LAB — TSH: TSH: 4.75 u[IU]/mL (ref 0.35–5.50)

## 2021-07-09 LAB — VITAMIN D 25 HYDROXY (VIT D DEFICIENCY, FRACTURES): VITD: 70.96 ng/mL (ref 30.00–100.00)

## 2021-07-09 MED ORDER — POTASSIUM CHLORIDE CRYS ER 10 MEQ PO TBCR: 10.0000 meq | EXTENDED_RELEASE_TABLET | Freq: Two times a day (BID) | ORAL | 1 refills | Status: DC

## 2021-07-16 ENCOUNTER — Telehealth: Payer: Self-pay

## 2021-07-16 NOTE — Telephone Encounter (Signed)
[name], NP  07/09/2021 12:51 PM EDT     Labs look good overall. Potassium is still slightly low. I want you to take 40 mEq a day for 3 days, then reduce back to your regular 20 mEq/day and let's recheck labs in about 7 days - please call to schedule a lab appointment.    Pt's daughter Blair Promise- called regarding results- informed of above. Lab appt scheduled 07/25/21.

## 2021-07-17 ENCOUNTER — Other Ambulatory Visit: Payer: Self-pay | Admitting: Urology

## 2021-07-23 ENCOUNTER — Telehealth: Payer: Self-pay | Admitting: Family Medicine

## 2021-07-23 ENCOUNTER — Other Ambulatory Visit: Payer: Self-pay | Admitting: *Deleted

## 2021-07-23 MED ORDER — AMLODIPINE BESYLATE 2.5 MG PO TABS: 2.5000 mg | ORAL_TABLET | Freq: Every day | ORAL | 1 refills | Status: DC

## 2021-07-23 MED ORDER — ATENOLOL-CHLORTHALIDONE 50-25 MG PO TABS: 1.0000 | ORAL_TABLET | Freq: Every day | ORAL | 1 refills | Status: DC

## 2021-07-23 MED ORDER — CITALOPRAM HYDROBROMIDE 20 MG PO TABS: 20.0000 mg | ORAL_TABLET | Freq: Every day | ORAL | 1 refills | Status: DC

## 2021-07-23 NOTE — Telephone Encounter (Signed)
Pt stated she got a call from [facility] that they needed a PA for an rx. She did not understand the message and what medication it is for. She wanted to make sure nurse was aware.

## 2021-07-24 NOTE — Telephone Encounter (Signed)
Called Pt to see what Rx and needs to call [facility] about what it is.

## 2021-07-25 ENCOUNTER — Other Ambulatory Visit (INDEPENDENT_AMBULATORY_CARE_PROVIDER_SITE_OTHER): Payer: Medicare HMO

## 2021-07-25 DIAGNOSIS — E876 Hypokalemia: Secondary | ICD-10-CM

## 2021-07-25 LAB — BASIC METABOLIC PANEL
BUN: 29 mg/dL — ABNORMAL HIGH (ref 6–23)
CO2: 30 mEq/L (ref 19–32)
Calcium: 9.9 mg/dL (ref 8.4–10.5)
Chloride: 95 mEq/L — ABNORMAL LOW (ref 96–112)
Creatinine, Ser: 1.19 mg/dL (ref 0.40–1.20)
GFR: 40.6 mL/min — ABNORMAL LOW (ref >60.00)
Glucose, Bld: 108 mg/dL — ABNORMAL HIGH (ref 70–99)
Potassium: 3.3 mEq/L — ABNORMAL LOW (ref 3.5–5.1)
Sodium: 136 mEq/L (ref 135–145)

## 2021-07-28 NOTE — Addendum Note (Signed)
Addended by: Caleen Jobs B on: 07/28/2021 03:03 PM   Modules accepted: Orders

## 2021-07-31 NOTE — Telephone Encounter (Addendum)
Patient returned call to office advised patient potassium still slightly low and Lovena Le would like for her to increase form 20 mEq to 40 mEq and repeat potassium in one week patient verbalized understanding and scheduled for labs on 6/14/ 23 at 2:15 pm and will call and reschedule if daughter is not available.

## 2021-08-02 ENCOUNTER — Encounter: Payer: Self-pay | Admitting: *Deleted

## 2021-08-07 ENCOUNTER — Other Ambulatory Visit (INDEPENDENT_AMBULATORY_CARE_PROVIDER_SITE_OTHER): Payer: Medicare HMO

## 2021-08-07 DIAGNOSIS — E876 Hypokalemia: Secondary | ICD-10-CM | POA: Diagnosis not present

## 2021-08-08 DIAGNOSIS — H524 Presbyopia: Secondary | ICD-10-CM | POA: Diagnosis not present

## 2021-08-08 DIAGNOSIS — H52223 Regular astigmatism, bilateral: Secondary | ICD-10-CM | POA: Diagnosis not present

## 2021-08-08 LAB — POTASSIUM: Potassium: 3.8 mEq/L (ref 3.5–5.1)

## 2021-08-28 ENCOUNTER — Ambulatory Visit: Payer: Medicare HMO | Admitting: Adult Health

## 2021-08-28 NOTE — Progress Notes (Unsigned)
Cardiology Clinic Note   Patient Name: Latoya Curry Date of Encounter: 08/29/2021  Primary Care Provider:  [name], NP Primary Cardiologist:  Jenean Lindau, MD  Patient Profile    Latoya Curry 88 year old female presents the clinic today for follow-up evaluation of her essential hypertension and hyperlipidemia.  Past Medical History    Past Medical History:  Diagnosis Date   Acute bronchitis 05/07/2020   Acute gangrenous appendicitis s/p lap appendectomy 06/24/2018 06/23/2018   Adjustment disorder 05/07/2020   Allergic bronchitis 05/07/2020   Allergic rhinitis 05/07/2020   Aortic regurgitation 05/10/2019   Aortic root enlargement ([place]) 08/19/2017   Appendicitis 05/07/2020   Ascending aorta dilation ([facility]) 08/19/2017   Benign paroxysmal positional vertigo 05/07/2020   Bronchitis 05/07/2020   Cardiac arrhythmia 05/07/2020   Chest discomfort 08/05/2017   Chest pain 05/07/2020   Chronic low back pain    Conjunctivitis of left eye 05/07/2020   Constipation 05/07/2020   Cough 09/23/2011   Followed in Pulmonary clinic/ [facility]/ [place]   - Sinus CT 09/25/2011 > neg    Diaphoresis 05/07/2020   Diarrhea 05/07/2020   Diverticular disease of colon 05/07/2020   Diverticulitis    Diverticulitis of colon 05/07/2020   DJD (degenerative joint disease) of knee    Eczema 05/07/2020   Encounter for general adult medical examination without abnormal findings 05/07/2020   Epistaxis 05/07/2020   Essential hypertension 08/05/2017   Fever 05/07/2020   Gastro-esophageal reflux disease without esophagitis 05/07/2020   Graves' disease 05/07/2020   Hearing loss 05/07/2020   History of diverticulitis 05/07/2020   Hypercholesteremia    Hypertension    Hyperthyroidism 05/07/2020   Hypokalemia 06/24/2018   Incarcerated incisional hernia s/p primary repair 06/24/2018 06/24/2018   Incontinence without sensory awareness 05/07/2020   Insomnia 05/07/2020   Left lower quadrant pain 05/07/2020   Loss of  appetite 05/07/2020   Low back pain 05/07/2020   Mixed dyslipidemia 08/05/2017   Nasal crusting 05/07/2020   Neck pain 05/07/2020   Neurodermatitis    Noninflammatory disorder of vagina 05/07/2020   Obesity    Osteopenia    Other long term (current) drug therapy 05/07/2020   Pain in left hip 03/16/2019   Pain in right hand 05/07/2020   Postnasal drip 05/07/2020   Problem related to social environment 05/07/2020   Pure hypercholesterolemia 05/07/2020   PVC (premature ventricular contraction) 08/05/2017   Rash 05/07/2020   Recurrent UTI    Right hip pain 05/07/2020   Sciatica 05/07/2020   Skin sensation disturbance 05/07/2020   Sleep disorder 05/07/2020   Small bowel obstruction, partial, s/p lap adhesiolysis 06/24/2018 06/24/2018   Spasm 05/07/2020   Thoracic aortic aneurysm [facility])    Thoracic aortic aneurysm without rupture ([place]) 05/07/2020   Thoracic aortic atherosclerosis ([place]) 01/11/2020   Thyrotoxicosis 05/07/2020   Transient global amnesia    Urinary tract infectious disease 05/07/2020   Viral syndrome 05/07/2020   Vitamin D deficiency 05/07/2020   Weight loss 05/07/2020   Past Surgical History:  Procedure Laterality Date   APPENDECTOMY     COLON RESECTION SIGMOID  2004   Dr Dalbert Batman   EXPLORATORY LAPAROTOMY  2004   r/o pancreas mass.  Dr Dalbert Batman   IR RADIOLOGIST EVAL & MGMT  07/14/2018   LAPAROSCOPIC APPENDECTOMY N/A 06/24/2018   Procedure: ACUTE GANGRENOUS APPENDICITIS WITH ABSCESS, PARTIAL SMALL BOWEL OBSTRUCTION, INCISIONAL HERNIA REPAIR, LYSIS OF ADHESIONS;  Surgeon: Michael Boston, MD;  Location: [facility];  Service: General;  Laterality: N/A;   TOTAL KNEE ARTHROPLASTY     bilaterallly    Allergies  Allergies  Allergen Reactions   Codeine Nausea And Vomiting   Levofloxacin     Other reaction(s): cardiac advised avoid/aneursym   Simvastatin Other (See Comments)    dizziness   Sulfamethoxazole-Trimethoprim Other (See Comments)     dyspepsia   Zestril [Lisinopril] Cough    History  of Present Illness    Latoya Curry has a PMH of aortic valve insufficiency, essential hypertension, hypercholesterolemia, ascending thoracic aortic aneurysm, and hypothyroidism.  She was seen in follow-up by Dr. Geraldo Pitter on 12/25/2020.  During that time she continued to do well.  She continued to perform her daily activities.  She was ambulating with a cane.  She reported having supportive family.  She presented with her daughter.  She denied chest pain.  Her blood pressure was 118/68 and pulse was 72.  Her thoracic aortic aneurysm was reviewed we will she reported having a follow-up appointment with surgery and follow-up CT is managed by their group.  She presents to the clinic today for follow-up evaluation states she feels well.  She has  moved and continues to be in senior living.  She is planning to walk more and has an opportunity to walk the halls where she lives.  She reports that she is aware of the amount of salt she consumes in her diet.  We reviewed her previous CT angio.  We also reviewed signs and symptoms of dissection.  Her and her daughter expressed understanding.  Her blood pressure today is 130/70 and her EKG shows sinus rhythm with first-degree AV block 73 bpm.  I will have her increase her physical activity as tolerated, continue heart healthy low-sodium diet, and plan follow-up for 6 months.  Today she denies chest pain, shortness of breath, lower extremity edema, fatigue, palpitations, melena, hematuria, hemoptysis, diaphoresis, weakness, presyncope, syncope, orthopnea, and PND.    Home Medications    Prior to Admission medications   Medication Sig Start Date End Date Taking? Authorizing Provider  amLODipine (NORVASC) 2.5 MG tablet Take 1 tablet (2.5 mg total) by mouth daily. 07/23/21   [name], NP  aspirin 81 MG tablet Take 81 mg by mouth every Monday, Wednesday, and Friday.    [provider]  atenolol-chlorthalidone (TENORETIC) 50-25 MG tablet Take 1  tablet by mouth daily. 07/23/21   [name], NP  Budeson-Glycopyrrol-Formoterol 160-9-4.8 MCG/ACT AERO Inhale 1 puff into the lungs as needed for wheezing or shortness of breath.    [provider]  CANNABIDIOL PO Take 250 mg by mouth 2 (two) times daily.    [provider]  cetirizine (ZYRTEC) 10 MG tablet Take 10 mg by mouth daily.    [provider]  cholecalciferol (VITAMIN D) 1000 UNITS tablet Take 1,000 Units by mouth daily.    [provider]  citalopram (CELEXA) 20 MG tablet Take 1 tablet (20 mg total) by mouth daily. 07/23/21   [name], NP  fesoterodine (TOVIAZ) 8 MG TB24 tablet TAKE 1 TABLET EVERY DAY 08/06/21   Franchot Gallo, MD  furosemide (LASIX) 20 MG tablet Take by mouth. 06/01/21   [provider]  levothyroxine (SYNTHROID) 50 MCG tablet Take 50 mcg by mouth daily before breakfast.  02/15/19   [provider]  Multiple Vitamin (MULTI VITAMIN) TABS Take 2 tablets by mouth daily.    [provider]  omeprazole (PRILOSEC) 20 MG capsule Take 20 mg  by mouth daily.    [provider]  potassium chloride (KLOR-CON M) 10 MEQ tablet Take 1 tablet (10 mEq total) by mouth 2 (two) times daily. 07/09/21   [name], NP  rosuvastatin (CRESTOR) 5 MG tablet Take 5 mg by mouth every Monday, Wednesday, and Friday. 07/17/19   [provider]  traMADol (ULTRAM) 50 MG tablet Take 50 mg by mouth every 12 (twelve) hours as needed for moderate pain.    [provider]    Family History    Family History  Problem Relation Age of Onset   Heart disease Father    Leukemia Mother    She indicated that her mother is deceased. She indicated that her father is deceased.  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  Tobacco Use   Smoking status: Never   Smokeless tobacco: Never  Vaping Use   Vaping Use: Never used  Substance and Sexual Activity   Alcohol use: No   Drug use: Never   Sexual activity: Not on file  Other Topics Concern   Not on file  Social History Narrative   Not on file   Social Determinants of Health   Financial Resource Strain: Not on file  Food Insecurity: Not on file  Transportation Needs: Not on file  Physical Activity: Not on file  Stress: Not on file  Social Connections: Not on file  Intimate Partner Violence: Not on file     Review of Systems    General:  No chills, fever, night sweats or weight changes.  Cardiovascular:  No chest pain, dyspnea on exertion, edema, orthopnea, palpitations, paroxysmal nocturnal dyspnea. Dermatological: No rash, lesions/masses Respiratory: No cough, dyspnea Urologic: No hematuria, dysuria Abdominal:   No nausea, vomiting, diarrhea, bright red blood per rectum, melena, or hematemesis Neurologic:  No visual changes, wkns, changes in mental status. All other systems reviewed and are otherwise negative except as noted above.  Physical Exam    VS:  BP 130/70   Pulse 73   Ht '5\' 5"'$  (1.651 m)   Wt 156 lb 3.2 oz (70.9 kg)   SpO2 91%   BMI 25.99 kg/m  , BMI Body mass index is 25.99 kg/m. GEN: Well nourished, well developed, in no acute distress. HEENT: normal. Neck: Supple, no JVD, carotid bruits, or masses. Cardiac: RRR, no murmurs, rubs, or gallops. No clubbing, cyanosis, edema.  Radials/DP/PT 2+ and equal bilaterally.  Respiratory:  Respirations regular and unlabored, clear to auscultation bilaterally. GI: Soft, nontender, nondistended, BS + x 4. MS: no deformity or atrophy. Skin: warm and dry, no rash. Neuro:  Strength and sensation are intact. Psych: Normal affect.  Accessory Clinical Findings    Recent Labs: 07/08/2021: ALT 12; Hemoglobin 14.5; Platelets  239.0; TSH 4.75 07/25/2021: BUN 29; Creatinine, Ser 1.19; Sodium 136 08/07/2021: Potassium 3.8   Recent Lipid Panel    Component Value Date/Time   CHOL 169 07/08/2021 1452   CHOL 177 12/25/2020 0952   TRIG 156.0 (H) 07/08/2021 1452   HDL 47.40 07/08/2021 1452   HDL 50 12/25/2020 0952   CHOLHDL 4 07/08/2021 1452   VLDL 31.2 07/08/2021 1452   LDLCALC 91 07/08/2021 1452  [place] 103 (H) 12/25/2020 0952    ECG personally reviewed by me today-sinus rhythm with first-degree AV block moderate voltage criteria for LVH 73 bpm- No acute changes  Echocardiogram 08/18/2017  Study Conclusions   - Left ventricle: The cavity size was normal. There was mild focal    basal hypertrophy of the septum. Systolic function was normal.    The estimated ejection fraction was in the range of 50% to 55%.    Wall motion was normal; there were no regional wall motion    abnormalities. Doppler parameters are consistent with abnormal    left ventricular relaxation (grade 1 diastolic dysfunction).    Doppler parameters are consistent with indeterminate ventricular    filling pressure.  - Aortic valve: Valve mobility was restricted. Transvalvular    velocity was within the normal range. There was no stenosis.    There was moderate regurgitation. Regurgitation pressure    half-time: 390 ms.  - Aorta: Ascending aortic diameter: 51 mm (S).  - Ascending aorta: The ascending aorta was severely dilated.  - Mitral valve: Transvalvular velocity was within the normal range.    There was no evidence for stenosis. There was mild regurgitation.  - Right ventricle: The cavity size was normal. Wall thickness was    normal. Systolic function was normal.  - Atrial septum: No defect or patent foramen ovale was identified.  - Pulmonary arteries: Systolic pressure was within the normal    range. PA peak pressure: 30 mm Hg (S).  CT angio chest 02/19/2021 Preferential opacification of the thoracic aorta. No evidence  of thoracic aortic dissection.   Conventional 3 vessel LEFT aortic arch. Asymmetric calcified atherosclerosis at the origin of the LEFT subclavian artery, without significant stenosis.   Incidental retropharyngeal course of the RIGHT common carotid artery. Incidental, dominant LEFT vertebral.   Aortic Root: Motion degraded, such that the aortic root could not be evaluated.   Thoracic Aorta:   --Ascending Aorta: 5.6 x 5.4 cm, previously 5.4 x 5.3 cm   --Aortic Arch: 3.5 cm   --Descending Aorta: 2.8 cm   Other:   Normal heart size.  No pericardial effusion.   Mediastinum/Nodes: Calcified LEFT hilar and paratracheal nodules, likely sequelae of prior granulomatous disease. No enlarged mediastinal, hilar, or axillary lymph nodes. Subcentimeter RIGHT thyroid nodules. The trachea, and esophagus demonstrate no significant findings.   Lungs/Pleura: Lungs are clear. LEFT basilar calcified granuloma. No focal consolidation or mass. No suspicious pulmonary nodule. No pleural effusion or pneumothorax.   Upper Abdomen: No acute abnormality.   Musculoskeletal: Similar appearance of T8 sclerosis with chronic T8 and T12 compression deformities. No acute osseous findings.   Review of the MIP images confirms the above findings.   IMPRESSION: 1. Aneurysmal ascending thoracic aorta, measuring 5.6 x 5.4 cm (previously 5.4 x 5.3 cm). Of note, greater than 5 mm growth over the past 12 months is associated with an increased risk of aneurysm rupture. Recommend cardiothoracic/vascular surgery referral if not already obtained.   This recommendation follows 2010 ACCF/AHA/AATS/ACR/ASA/SCA/SCAI/SIR/STS/SVM Guidelines for the Diagnosis and Management of Patients With Thoracic Aortic Disease. Circulation. 2010; 121: K812-X517. Aortic aneurysm NOS (ICD10-I71.9). 2. LEFT basilar calcified granuloma and calcified LEFT hilar nodes. Findings likely to represent sequela of prior granulomatous  disease. 3. Similar appearance of T8 sclerosis and chronic T8 and T12 compression deformities. No acute osseous abnormality.   These results will be called to the ordering clinician or representative by the Radiologist Assistant, and communication documented in the PACS or [place]  Dashboard.     Electronically Signed   By: Michaelle Birks M.D.   On: 02/19/2021 12:06  Assessment & Plan   1.  Essential hypertension-BP today 130/70.  Well-controlled at home. Continue amlodipine Heart healthy low-sodium diet-salty 6 given Increase physical activity as tolerated  Hyperlipidemia-LDL 91 07/08/21.   Continue aspirin, rosuvastatin Heart healthy low-sodium high-fiber diet Increase physical activity as tolerated  Ascending thoracic aortic aneurysm-no recent episodes of back or chest discomfort.  Follows with surgery.  CT angio chest 02/19/2021 showed stable ascending aortic aneurysm measuring 56 mm x 54 mm and was previously 54 mm x 53 mm.  Recommendation for repeat CT  12/23  Disposition: Follow-up with Dr. Geraldo Pitter or me in 6 months.   Jossie Ng. Renlee Floor NP-C     08/29/2021, 12:07 PM [place] [address] Office [phone] Fax [phone]  Notice: This dictation was prepared with Dragon dictation along with smaller phrase technology. Any transcriptional errors that result from this process are unintentional and may not be corrected upon review.  I spent 14 minutes examining this patient, reviewing medications, and using patient centered shared decision making involving her cardiac care.  Prior to her visit I spent greater than 20 minutes reviewing her past medical history,  medications, and prior cardiac tests.

## 2021-08-29 ENCOUNTER — Encounter: Payer: Self-pay | Admitting: General Practice

## 2021-08-29 ENCOUNTER — Ambulatory Visit: Payer: Medicare HMO | Admitting: Cardiology

## 2021-08-29 ENCOUNTER — Ambulatory Visit: Payer: Medicare HMO | Admitting: General Practice

## 2021-08-29 VITALS — BP 130/70 | HR 73 | Ht 65.0 in | Wt 156.2 lb

## 2021-08-29 DIAGNOSIS — E7800 Pure hypercholesterolemia, unspecified: Secondary | ICD-10-CM | POA: Diagnosis not present

## 2021-08-29 DIAGNOSIS — I10 Essential (primary) hypertension: Secondary | ICD-10-CM

## 2021-08-29 DIAGNOSIS — I712 Thoracic aortic aneurysm, without rupture, unspecified: Secondary | ICD-10-CM

## 2021-08-29 NOTE — Patient Instructions (Signed)
Medication Instructions:  The current medical regimen is effective;  continue present plan and medications as directed. Please refer to the Current Medication list given to you today.   *If you need a refill on your cardiac medications before your next appointment, please call your pharmacy*  Lab Work:   Testing/Procedures:  NONE    NONE If you have labs (blood work) drawn today and your tests are completely normal, you will receive your results only by: [place] (if you have MyChart) OR  A paper copy in the mail If you have any lab test that is abnormal or we need to change your treatment, we will call you to review the results.  Special Instructions INCREASE WALKING-SLOWLY  PLEASE READ AND FOLLOW INCREASED FIBER DIET-ATTACHED  Follow-Up: Your next appointment:  6 month(s) In Person with Jyl Heinz, MD    At [facility], you and your health needs are our priority.  As part of our continuing mission to provide you with exceptional heart care, we have created designated Provider Care Teams.  These Care Teams include your primary Cardiologist (physician) and Advanced Practice Providers (APPs -  Physician Assistants and Nurse Practitioners) who all work together to provide you with the care you need, when you need it.  Important Information About Sugar     High-Fiber Eating Plan Fiber, also called dietary fiber, is a type of carbohydrate. It is found foods such as fruits, vegetables, whole grains, and beans. A high-fiber diet can have many health benefits. Your health care provider may recommend a high-fiber diet to help: Prevent constipation. Fiber can make your bowel movements more regular. Lower your cholesterol. Relieve the following conditions: Inflammation of veins in the anus (hemorrhoids). Inflammation of specific areas of the digestive tract (uncomplicated diverticulosis). A problem of the large intestine, also called the colon, that sometimes causes pain and  diarrhea (irritable bowel syndrome, or IBS). Prevent overeating as part of a weight-loss plan. Prevent heart disease, type 2 diabetes, and certain cancers. What are tips for following this plan? Reading food labels  Check the nutrition facts label on food products for the amount of dietary fiber. Choose foods that have 5 grams of fiber or more per serving. The goals for recommended daily fiber intake include: Men (age 37 or younger): 34-38 g. Men (over age 102): 28-34 g. Women (age 20 or younger): 25-28 g. Women (over age 70): 22-25 g. Shopping Choose whole fruits and vegetables instead of processed forms, such as apple juice or applesauce. Choose a wide variety of high-fiber foods such as avocados, lentils, oats, and kidney beans. Read the nutrition facts label of the foods you choose. Be aware of foods with added fiber. These foods often have high sugar and sodium amounts per serving. Cooking Use whole-grain flour for baking and cooking. Cook with brown rice instead of white rice. Meal planning Start the day with a breakfast that is high in fiber, such as a cereal that contains 5 g of fiber or more per serving. Eat breads and cereals that are made with whole-grain flour instead of refined flour or white flour. Eat brown rice, bulgur wheat, or millet instead of white rice. Use beans in place of meat in soups, salads, and pasta dishes. Be sure that half of the grains you eat each day are whole grains. General information You can get the recommended daily intake of dietary fiber by: Eating a variety of fruits, vegetables, grains, nuts, and beans. Taking a fiber supplement if you are  not able to take in enough fiber in your diet. It is better to get fiber through food than from a supplement. Gradually increase how much fiber you consume. If you increase your intake of dietary fiber too quickly, you may have bloating, cramping, or gas. Drink plenty of water to help you digest fiber. Choose  high-fiber snacks, such as berries, raw vegetables, nuts, and popcorn. What foods should I eat? Fruits Berries. Pears. Apples. Oranges. Avocado. Prunes and raisins. Dried figs. Vegetables Sweet potatoes. Spinach. Kale. Artichokes. Cabbage. Broccoli. Cauliflower. Green peas. Carrots. Squash. Grains Whole-grain breads. Multigrain cereal. Oats and oatmeal. Brown rice. Barley. Bulgur wheat. [place]. Quinoa. Bran muffins. Popcorn. Rye wafer crackers. Meats and other proteins Navy beans, kidney beans, and pinto beans. Soybeans. Split peas. Lentils. Nuts and seeds. Dairy Fiber-fortified yogurt. Beverages Fiber-fortified soy milk. Fiber-fortified orange juice. Other foods Fiber bars. The items listed above may not be a complete list of recommended foods and beverages. Contact a dietitian for more information. What foods should I avoid? Fruits Fruit juice. Cooked, strained fruit. Vegetables Fried potatoes. Canned vegetables. Well-cooked vegetables. Grains White bread. Pasta made with refined flour. White rice. Meats and other proteins Fatty cuts of meat. Fried chicken or fried fish. Dairy Milk. Yogurt. Cream cheese. Sour cream. Fats and oils Butters. Beverages Soft drinks. Other foods Cakes and pastries. The items listed above may not be a complete list of foods and beverages to avoid. Talk with your dietitian about what choices are best for you. Summary Fiber is a type of carbohydrate. It is found in foods such as fruits, vegetables, whole grains, and beans. A high-fiber diet has many benefits. It can help to prevent constipation, lower blood cholesterol, aid weight loss, and reduce your risk of heart disease, diabetes, and certain cancers. Increase your intake of fiber gradually. Increasing fiber too quickly may cause cramping, bloating, and gas. Drink plenty of water while you increase the amount of fiber you consume. The best sources of fiber include whole fruits and vegetables,  whole grains, nuts, seeds, and beans. This information is not intended to replace advice given to you by your health care provider. Make sure you discuss any questions you have with your health care provider. Document Revised: 06/16/2019 Document Reviewed: 06/16/2019 Elsevier Patient Education  [place].

## 2021-09-06 ENCOUNTER — Ambulatory Visit: Payer: Medicare HMO | Admitting: Cardiology

## 2021-11-15 DIAGNOSIS — N8111 Cystocele, midline: Secondary | ICD-10-CM | POA: Diagnosis not present

## 2021-11-19 ENCOUNTER — Ambulatory Visit: Payer: Medicare HMO | Admitting: Cardiology

## 2021-11-26 DIAGNOSIS — D239 Other benign neoplasm of skin, unspecified: Secondary | ICD-10-CM | POA: Diagnosis not present

## 2021-11-26 DIAGNOSIS — X32XXXS Exposure to sunlight, sequela: Secondary | ICD-10-CM | POA: Diagnosis not present

## 2021-11-26 DIAGNOSIS — L281 Prurigo nodularis: Secondary | ICD-10-CM | POA: Diagnosis not present

## 2021-11-26 DIAGNOSIS — L814 Other melanin hyperpigmentation: Secondary | ICD-10-CM | POA: Diagnosis not present

## 2021-11-26 DIAGNOSIS — L570 Actinic keratosis: Secondary | ICD-10-CM | POA: Diagnosis not present

## 2021-11-26 DIAGNOSIS — Z85828 Personal history of other malignant neoplasm of skin: Secondary | ICD-10-CM | POA: Diagnosis not present

## 2021-11-26 DIAGNOSIS — E663 Overweight: Secondary | ICD-10-CM | POA: Diagnosis not present

## 2021-12-10 ENCOUNTER — Other Ambulatory Visit (HOSPITAL_BASED_OUTPATIENT_CLINIC_OR_DEPARTMENT_OTHER): Payer: Self-pay

## 2021-12-10 MED ORDER — FLUAD QUADRIVALENT 0.5 ML IM PRSY
PREFILLED_SYRINGE | INTRAMUSCULAR | 0 refills | Status: DC
  Filled 2021-12-10: qty 0.5, 1d supply, fill #0

## 2021-12-10 MED ORDER — COMIRNATY 30 MCG/0.3ML IM SUSY
PREFILLED_SYRINGE | INTRAMUSCULAR | 0 refills | Status: DC
  Filled 2021-12-10: qty 0.3, 1d supply, fill #0

## 2021-12-20 ENCOUNTER — Other Ambulatory Visit: Payer: Self-pay | Admitting: Family Medicine

## 2022-01-06 NOTE — Progress Notes (Unsigned)
   Established Patient Office Visit  Subjective   Patient ID: Latoya Curry, female    DOB: Jun 10, 1930  Age: 88 y.o. MRN: [id number]  No chief complaint on file.   HPI  Patient is here for 37-monthfollow-up. ***  HTN and Aortic Aneurysm - She [facility] per [facility]. She also sees Dr. VNils Pylefor aortic aneurysm monitoring (CT scan/appointment once a year). Reports HTN has been well controlled for years. Goal <140 SBP d/t aneurysm.   - amlodipine 2.5 mg daily, atenolol-chlorthalidone 50-25 mg daily, lasix 20 mg daily  Depression/Anxiety - Celexa 20 mg. Mood is good; no SI/HI. No new concerns.  Hypothyroidism (acquired) - Levothyroxine 50 mcg. Hx of Grave's disease treated with radiation. Denies any symptoms at this time.   Hypokalemia - reports chronic low potassium. Takes K-Dur 10 mEq BID.   HLD: - medications: Crestor 5 mg three times weekly - compliance: *** - medication SEs: *** The ASCVD Risk score (Arnett DK, et al., 2019) failed to calculate for the following reasons:   The 2019 ASCVD risk score is only valid for ages 457to 733 Allergic bronchitis hx - uses Breztri PRN. Denies COPD dx. No current concerns. ***     {History (Optional):23778}  ROS All review of systems negative except what is listed in the HPI    Objective:     There were no vitals taken for this visit. {Vitals History (Optional):23777}  Physical Exam   No results found for any visits on 01/08/22.  {Labs (Optional):23779}  The ASCVD Risk score (Arnett DK, et al., 2019) failed to calculate for the following reasons:   The 2019 ASCVD risk score is only valid for ages 449to 714   Assessment & Plan:   Problem List Items Addressed This Visit   None   No follow-ups on file.    TTerrilyn Saver NP

## 2022-01-08 ENCOUNTER — Ambulatory Visit (INDEPENDENT_AMBULATORY_CARE_PROVIDER_SITE_OTHER): Payer: Medicare HMO | Admitting: Family Medicine

## 2022-01-08 ENCOUNTER — Other Ambulatory Visit (HOSPITAL_BASED_OUTPATIENT_CLINIC_OR_DEPARTMENT_OTHER): Payer: Self-pay

## 2022-01-08 ENCOUNTER — Encounter: Payer: Self-pay | Admitting: Family Medicine

## 2022-01-08 VITALS — BP 120/78 | HR 74 | Temp 98.0°F | Ht 65.0 in | Wt 152.0 lb

## 2022-01-08 DIAGNOSIS — F32A Depression, unspecified: Secondary | ICD-10-CM | POA: Diagnosis not present

## 2022-01-08 DIAGNOSIS — E559 Vitamin D deficiency, unspecified: Secondary | ICD-10-CM | POA: Diagnosis not present

## 2022-01-08 DIAGNOSIS — M179 Osteoarthritis of knee, unspecified: Secondary | ICD-10-CM | POA: Diagnosis not present

## 2022-01-08 DIAGNOSIS — F419 Anxiety disorder, unspecified: Secondary | ICD-10-CM

## 2022-01-08 DIAGNOSIS — I10 Essential (primary) hypertension: Secondary | ICD-10-CM | POA: Diagnosis not present

## 2022-01-08 DIAGNOSIS — E039 Hypothyroidism, unspecified: Secondary | ICD-10-CM | POA: Insufficient documentation

## 2022-01-08 DIAGNOSIS — E782 Mixed hyperlipidemia: Secondary | ICD-10-CM

## 2022-01-08 DIAGNOSIS — E876 Hypokalemia: Secondary | ICD-10-CM | POA: Diagnosis not present

## 2022-01-08 LAB — COMPREHENSIVE METABOLIC PANEL
ALT: 9 U/L (ref 0–35)
AST: 18 U/L (ref 0–37)
Albumin: 4.1 g/dL (ref 3.5–5.2)
Alkaline Phosphatase: 50 U/L (ref 39–117)
BUN: 15 mg/dL (ref 6–23)
CO2: 35 mEq/L — ABNORMAL HIGH (ref 19–32)
Calcium: 9.3 mg/dL (ref 8.4–10.5)
Chloride: 97 mEq/L (ref 96–112)
Creatinine, Ser: 0.91 mg/dL (ref 0.40–1.20)
GFR: 55.84 mL/min — ABNORMAL LOW (ref >60.00)
Glucose, Bld: 97 mg/dL (ref 70–99)
Potassium: 3 mEq/L — ABNORMAL LOW (ref 3.5–5.1)
Sodium: 138 mEq/L (ref 135–145)
Total Bilirubin: 0.8 mg/dL (ref 0.2–1.2)
Total Protein: 6.7 g/dL (ref 6.0–8.3)

## 2022-01-08 LAB — CBC
HCT: 42.7 % (ref 36.0–46.0)
Hemoglobin: 14.4 g/dL (ref 12.0–15.0)
MCHC: 33.8 g/dL (ref 30.0–36.0)
MCV: 92.8 fl (ref 78.0–100.0)
Platelets: 257 10*3/uL (ref 150.0–400.0)
RBC: 4.6 Mil/uL (ref 3.87–5.11)
RDW: 13.5 % (ref 11.5–15.5)
WBC: 8.3 10*3/uL (ref 4.0–10.5)

## 2022-01-08 LAB — LIPID PANEL
Cholesterol: 151 mg/dL (ref 0–200)
HDL: 47.4 mg/dL (ref >39.00)
LDL Cholesterol: 81 mg/dL (ref 0–99)
NonHDL: 103.67
Total CHOL/HDL Ratio: 3
Triglycerides: 113 mg/dL (ref 0.0–149.0)
VLDL: 22.6 mg/dL (ref 0.0–40.0)

## 2022-01-08 LAB — VITAMIN D 25 HYDROXY (VIT D DEFICIENCY, FRACTURES): VITD: 88.41 ng/mL (ref 30.00–100.00)

## 2022-01-08 LAB — TSH: TSH: 6.74 u[IU]/mL — ABNORMAL HIGH (ref 0.35–5.50)

## 2022-01-08 MED ORDER — AREXVY 120 MCG/0.5ML IM SUSR
INTRAMUSCULAR | 0 refills | Status: DC
  Filled 2022-01-08: qty 0.5, 1d supply, fill #0

## 2022-01-08 NOTE — Assessment & Plan Note (Signed)
Mood is stable to Celexa 20 mg. No SI/HI. No changes today.

## 2022-01-08 NOTE — Assessment & Plan Note (Signed)
Blood pressure is at goal for age and co-morbidities.   Recommendations: continue amlodipine, atenolol-chlorthalidone, lasix - BP goal <130/80 - monitor and log blood pressures at home - check around the same time each day in a relaxed setting - Limit salt to <2000 mg/day - Follow DASH eating plan (heart healthy diet) - limit alcohol to 2 standard drinks per day for men and 1 per day for women - avoid tobacco products - get at least 2 hours of regular aerobic exercise weekly Patient aware of signs/symptoms requiring further/urgent evaluation. Labs updated today.

## 2022-01-08 NOTE — Assessment & Plan Note (Signed)
Previously well controlled Continue Synthroid at 50 mcg daily Recheck TSH and adjust Synthroid as indicated

## 2022-01-08 NOTE — Assessment & Plan Note (Signed)
-  Reviewed most recent lipid panel -Medication management: continue rosuvastatin -Repeat CMP and lipid panel today -Diet low in saturated fat -Regular physical activity as tolerated

## 2022-01-11 MED ORDER — LEVOTHYROXINE SODIUM 25 MCG PO TABS: 25.0000 ug | ORAL_TABLET | ORAL | 3 refills | Status: DC

## 2022-01-11 NOTE — Addendum Note (Signed)
Addended by: Caleen Jobs B on: 01/11/2022 01:34 PM   Modules accepted: Orders

## 2022-01-22 ENCOUNTER — Other Ambulatory Visit: Payer: Self-pay

## 2022-01-22 ENCOUNTER — Other Ambulatory Visit: Payer: Medicare HMO

## 2022-01-22 ENCOUNTER — Other Ambulatory Visit (INDEPENDENT_AMBULATORY_CARE_PROVIDER_SITE_OTHER): Payer: Medicare HMO

## 2022-01-22 ENCOUNTER — Telehealth: Payer: Self-pay | Admitting: Family Medicine

## 2022-01-22 DIAGNOSIS — E876 Hypokalemia: Secondary | ICD-10-CM

## 2022-01-22 LAB — POTASSIUM: Potassium: 3.3 mEq/L — ABNORMAL LOW (ref 3.5–5.1)

## 2022-01-22 MED ORDER — POTASSIUM CHLORIDE CRYS ER 20 MEQ PO TBCR: 20.0000 meq | EXTENDED_RELEASE_TABLET | Freq: Two times a day (BID) | ORAL | 0 refills | Status: DC

## 2022-01-22 NOTE — Telephone Encounter (Signed)
resent

## 2022-01-22 NOTE — Addendum Note (Signed)
Addended by: Caleen Jobs B on: 01/22/2022 12:51 PM   Modules accepted: Orders

## 2022-01-22 NOTE — Telephone Encounter (Signed)
Daughter called back to advise that Potassium prescription should be sent to [facility] [address], [place], [place] [address]

## 2022-01-26 ENCOUNTER — Encounter: Payer: Self-pay | Admitting: Family Medicine

## 2022-01-27 ENCOUNTER — Encounter: Payer: Self-pay | Admitting: Family Medicine

## 2022-01-28 ENCOUNTER — Encounter (HOSPITAL_COMMUNITY): Payer: Self-pay

## 2022-01-28 ENCOUNTER — Telehealth: Payer: Self-pay | Admitting: *Deleted

## 2022-01-28 ENCOUNTER — Encounter (HOSPITAL_COMMUNITY): Payer: Self-pay | Admitting: Family Medicine

## 2022-01-28 ENCOUNTER — Other Ambulatory Visit: Payer: Self-pay

## 2022-01-28 ENCOUNTER — Inpatient Hospital Stay (HOSPITAL_BASED_OUTPATIENT_CLINIC_OR_DEPARTMENT_OTHER)
Admission: EM | Admit: 2022-01-28 | Discharge: 2022-02-05 | DRG: 378 | Disposition: A | Discharge status: Skilled Nursing Facility | Payer: Medicare HMO | Attending: Internal Medicine | Admitting: Internal Medicine

## 2022-01-28 DIAGNOSIS — M6281 Muscle weakness (generalized): Secondary | ICD-10-CM | POA: Diagnosis not present

## 2022-01-28 DIAGNOSIS — E0590 Thyrotoxicosis, unspecified without thyrotoxic crisis or storm: Secondary | ICD-10-CM | POA: Diagnosis not present

## 2022-01-28 DIAGNOSIS — Z7401 Bed confinement status: Secondary | ICD-10-CM | POA: Diagnosis not present

## 2022-01-28 DIAGNOSIS — M545 Low back pain, unspecified: Secondary | ICD-10-CM | POA: Diagnosis not present

## 2022-01-28 DIAGNOSIS — K921 Melena: Secondary | ICD-10-CM | POA: Diagnosis not present

## 2022-01-28 DIAGNOSIS — N390 Urinary tract infection, site not specified: Secondary | ICD-10-CM | POA: Diagnosis not present

## 2022-01-28 DIAGNOSIS — Z8249 Family history of ischemic heart disease and other diseases of the circulatory system: Secondary | ICD-10-CM

## 2022-01-28 DIAGNOSIS — Z881 Allergy status to other antibiotic agents status: Secondary | ICD-10-CM

## 2022-01-28 DIAGNOSIS — H9190 Unspecified hearing loss, unspecified ear: Secondary | ICD-10-CM | POA: Diagnosis present

## 2022-01-28 DIAGNOSIS — Z66 Do not resuscitate: Secondary | ICD-10-CM | POA: Diagnosis present

## 2022-01-28 DIAGNOSIS — Z7982 Long term (current) use of aspirin: Secondary | ICD-10-CM

## 2022-01-28 DIAGNOSIS — M5432 Sciatica, left side: Secondary | ICD-10-CM | POA: Diagnosis present

## 2022-01-28 DIAGNOSIS — Z79890 Hormone replacement therapy (postmenopausal): Secondary | ICD-10-CM

## 2022-01-28 DIAGNOSIS — R338 Other retention of urine: Secondary | ICD-10-CM | POA: Diagnosis not present

## 2022-01-28 DIAGNOSIS — E039 Hypothyroidism, unspecified: Secondary | ICD-10-CM | POA: Diagnosis not present

## 2022-01-28 DIAGNOSIS — K625 Hemorrhage of anus and rectum: Secondary | ICD-10-CM

## 2022-01-28 DIAGNOSIS — Z96653 Presence of artificial knee joint, bilateral: Secondary | ICD-10-CM | POA: Diagnosis present

## 2022-01-28 DIAGNOSIS — E872 Acidosis, unspecified: Secondary | ICD-10-CM | POA: Diagnosis not present

## 2022-01-28 DIAGNOSIS — E669 Obesity, unspecified: Secondary | ICD-10-CM | POA: Diagnosis present

## 2022-01-28 DIAGNOSIS — F419 Anxiety disorder, unspecified: Secondary | ICD-10-CM | POA: Diagnosis not present

## 2022-01-28 DIAGNOSIS — K311 Adult hypertrophic pyloric stenosis: Secondary | ICD-10-CM | POA: Diagnosis not present

## 2022-01-28 DIAGNOSIS — N1330 Unspecified hydronephrosis: Secondary | ICD-10-CM | POA: Diagnosis not present

## 2022-01-28 DIAGNOSIS — F32A Depression, unspecified: Secondary | ICD-10-CM | POA: Diagnosis present

## 2022-01-28 DIAGNOSIS — Z923 Personal history of irradiation: Secondary | ICD-10-CM

## 2022-01-28 DIAGNOSIS — K2951 Unspecified chronic gastritis with bleeding: Secondary | ICD-10-CM | POA: Diagnosis not present

## 2022-01-28 DIAGNOSIS — R5383 Other fatigue: Secondary | ICD-10-CM | POA: Diagnosis not present

## 2022-01-28 DIAGNOSIS — M5431 Sciatica, right side: Secondary | ICD-10-CM | POA: Diagnosis present

## 2022-01-28 DIAGNOSIS — K3189 Other diseases of stomach and duodenum: Secondary | ICD-10-CM | POA: Diagnosis not present

## 2022-01-28 DIAGNOSIS — K2970 Gastritis, unspecified, without bleeding: Secondary | ICD-10-CM | POA: Diagnosis not present

## 2022-01-28 DIAGNOSIS — G8929 Other chronic pain: Secondary | ICD-10-CM | POA: Diagnosis not present

## 2022-01-28 DIAGNOSIS — D62 Acute posthemorrhagic anemia: Secondary | ICD-10-CM | POA: Diagnosis present

## 2022-01-28 DIAGNOSIS — Z7951 Long term (current) use of inhaled steroids: Secondary | ICD-10-CM

## 2022-01-28 DIAGNOSIS — Z9049 Acquired absence of other specified parts of digestive tract: Secondary | ICD-10-CM

## 2022-01-28 DIAGNOSIS — Z79891 Long term (current) use of opiate analgesic: Secondary | ICD-10-CM

## 2022-01-28 DIAGNOSIS — I10 Essential (primary) hypertension: Secondary | ICD-10-CM | POA: Diagnosis not present

## 2022-01-28 DIAGNOSIS — K2971 Gastritis, unspecified, with bleeding: Secondary | ICD-10-CM | POA: Diagnosis not present

## 2022-01-28 DIAGNOSIS — Z87440 Personal history of urinary (tract) infections: Secondary | ICD-10-CM

## 2022-01-28 DIAGNOSIS — F418 Other specified anxiety disorders: Secondary | ICD-10-CM | POA: Diagnosis not present

## 2022-01-28 DIAGNOSIS — E782 Mixed hyperlipidemia: Secondary | ICD-10-CM | POA: Diagnosis not present

## 2022-01-28 DIAGNOSIS — I712 Thoracic aortic aneurysm, without rupture, unspecified: Secondary | ICD-10-CM | POA: Diagnosis present

## 2022-01-28 DIAGNOSIS — E7800 Pure hypercholesterolemia, unspecified: Secondary | ICD-10-CM | POA: Diagnosis not present

## 2022-01-28 DIAGNOSIS — R58 Hemorrhage, not elsewhere classified: Secondary | ICD-10-CM | POA: Diagnosis not present

## 2022-01-28 DIAGNOSIS — Z22322 Carrier or suspected carrier of Methicillin resistant Staphylococcus aureus: Secondary | ICD-10-CM

## 2022-01-28 DIAGNOSIS — Z6824 Body mass index (BMI) 24.0-24.9, adult: Secondary | ICD-10-CM

## 2022-01-28 DIAGNOSIS — E0500 Thyrotoxicosis with diffuse goiter without thyrotoxic crisis or storm: Secondary | ICD-10-CM | POA: Diagnosis present

## 2022-01-28 DIAGNOSIS — Z888 Allergy status to other drugs, medicaments and biological substances status: Secondary | ICD-10-CM

## 2022-01-28 DIAGNOSIS — K922 Gastrointestinal hemorrhage, unspecified: Secondary | ICD-10-CM | POA: Diagnosis not present

## 2022-01-28 DIAGNOSIS — R41841 Cognitive communication deficit: Secondary | ICD-10-CM | POA: Diagnosis not present

## 2022-01-28 DIAGNOSIS — E876 Hypokalemia: Secondary | ICD-10-CM | POA: Diagnosis not present

## 2022-01-28 DIAGNOSIS — R2689 Other abnormalities of gait and mobility: Secondary | ICD-10-CM | POA: Diagnosis not present

## 2022-01-28 DIAGNOSIS — K219 Gastro-esophageal reflux disease without esophagitis: Secondary | ICD-10-CM | POA: Diagnosis present

## 2022-01-28 DIAGNOSIS — R339 Retention of urine, unspecified: Secondary | ICD-10-CM | POA: Diagnosis present

## 2022-01-28 DIAGNOSIS — J309 Allergic rhinitis, unspecified: Secondary | ICD-10-CM | POA: Diagnosis present

## 2022-01-28 DIAGNOSIS — N1339 Other hydronephrosis: Secondary | ICD-10-CM | POA: Diagnosis not present

## 2022-01-28 DIAGNOSIS — Z79899 Other long term (current) drug therapy: Secondary | ICD-10-CM

## 2022-01-28 DIAGNOSIS — R410 Disorientation, unspecified: Secondary | ICD-10-CM | POA: Diagnosis not present

## 2022-01-28 DIAGNOSIS — N134 Hydroureter: Secondary | ICD-10-CM | POA: Diagnosis not present

## 2022-01-28 DIAGNOSIS — Z885 Allergy status to narcotic agent status: Secondary | ICD-10-CM

## 2022-01-28 LAB — CBC WITH DIFFERENTIAL/PLATELET
Abs Immature Granulocytes: 0.04 10*3/uL (ref 0.00–0.07)
Basophils Absolute: 0.1 10*3/uL (ref 0.0–0.1)
Basophils Relative: 1 %
Eosinophils Absolute: 0 10*3/uL (ref 0.0–0.5)
Eosinophils Relative: 0 %
HCT: 30 % — ABNORMAL LOW (ref 36.0–46.0)
Hemoglobin: 10.4 g/dL — ABNORMAL LOW (ref 12.0–15.0)
Immature Granulocytes: 0 %
Lymphocytes Relative: 14 %
Lymphs Abs: 1.4 10*3/uL (ref 0.7–4.0)
MCH: 31.9 pg (ref 26.0–34.0)
MCHC: 34.7 g/dL (ref 30.0–36.0)
MCV: 92 fL (ref 80.0–100.0)
Monocytes Absolute: 0.6 10*3/uL (ref 0.1–1.0)
Monocytes Relative: 5 %
Neutro Abs: 8.4 10*3/uL — ABNORMAL HIGH (ref 1.7–7.7)
Neutrophils Relative %: 80 %
Platelets: 281 10*3/uL (ref 150–400)
RBC: 3.26 MIL/uL — ABNORMAL LOW (ref 3.87–5.11)
RDW: 13.2 % (ref 11.5–15.5)
WBC: 10.6 10*3/uL — ABNORMAL HIGH (ref 4.0–10.5)
nRBC: 0 % (ref 0.0–0.2)

## 2022-01-28 LAB — BASIC METABOLIC PANEL
Anion gap: 11 (ref 5–15)
BUN: 30 mg/dL — ABNORMAL HIGH (ref 8–23)
CO2: 22 mmol/L (ref 22–32)
Calcium: 8.7 mg/dL — ABNORMAL LOW (ref 8.9–10.3)
Chloride: 100 mmol/L (ref 98–111)
Creatinine, Ser: 0.97 mg/dL (ref 0.44–1.00)
GFR, Estimated: 56 mL/min — ABNORMAL LOW (ref >60)
Glucose, Bld: 96 mg/dL (ref 70–99)
Potassium: 2.8 mmol/L — ABNORMAL LOW (ref 3.5–5.1)
Sodium: 133 mmol/L — ABNORMAL LOW (ref 135–145)

## 2022-01-28 LAB — COMPREHENSIVE METABOLIC PANEL
ALT: 12 U/L (ref 0–44)
AST: 26 U/L (ref 15–41)
Albumin: 3.8 g/dL (ref 3.5–5.0)
Alkaline Phosphatase: 37 U/L — ABNORMAL LOW (ref 38–126)
Anion gap: 9 (ref 5–15)
BUN: 46 mg/dL — ABNORMAL HIGH (ref 8–23)
CO2: 30 mmol/L (ref 22–32)
Calcium: 9.5 mg/dL (ref 8.9–10.3)
Chloride: 94 mmol/L — ABNORMAL LOW (ref 98–111)
Creatinine, Ser: 1.04 mg/dL — ABNORMAL HIGH (ref 0.44–1.00)
GFR, Estimated: 51 mL/min — ABNORMAL LOW (ref >60)
Glucose, Bld: 135 mg/dL — ABNORMAL HIGH (ref 70–99)
Potassium: 2.1 mmol/L — CL (ref 3.5–5.1)
Sodium: 133 mmol/L — ABNORMAL LOW (ref 135–145)
Total Bilirubin: 0.7 mg/dL (ref 0.3–1.2)
Total Protein: 6.7 g/dL (ref 6.5–8.1)

## 2022-01-28 LAB — HEMOGLOBIN AND HEMATOCRIT, BLOOD
HCT: 27.3 % — ABNORMAL LOW (ref 36.0–46.0)
HCT: 30.7 % — ABNORMAL LOW (ref 36.0–46.0)
Hemoglobin: 9.5 g/dL — ABNORMAL LOW (ref 12.0–15.0)
Hemoglobin: 10.1 g/dL — ABNORMAL LOW (ref 12.0–15.0)

## 2022-01-28 LAB — OCCULT BLOOD X 1 CARD TO LAB, STOOL: Fecal Occult Bld: POSITIVE — AB

## 2022-01-28 LAB — MAGNESIUM
Magnesium: 1.8 mg/dL (ref 1.7–2.4)
Magnesium: 1.8 mg/dL (ref 1.7–2.4)

## 2022-01-28 LAB — MRSA NEXT GEN BY PCR, NASAL: MRSA by PCR Next Gen: DETECTED — AB

## 2022-01-28 MED ORDER — POTASSIUM CHLORIDE 10 MEQ/100ML IV SOLN
10.0000 meq | INTRAVENOUS | Status: AC
  Administered 2022-01-28 – 2022-01-29 (×5): 10 meq via INTRAVENOUS
  Filled 2022-01-28 (×5): qty 100

## 2022-01-28 MED ORDER — POTASSIUM CHLORIDE 10 MEQ/100ML IV SOLN
10.0000 meq | INTRAVENOUS | Status: AC
  Administered 2022-01-28 (×5): 10 meq via INTRAVENOUS
  Filled 2022-01-28 (×5): qty 100

## 2022-01-28 MED ORDER — ACETAMINOPHEN 325 MG PO TABS
650.0000 mg | ORAL_TABLET | Freq: Once | ORAL | Status: AC
  Administered 2022-01-28: 650 mg via ORAL
  Filled 2022-01-28: qty 2

## 2022-01-28 MED ORDER — CITALOPRAM HYDROBROMIDE 20 MG PO TABS
20.0000 mg | ORAL_TABLET | Freq: Every day | ORAL | Status: DC
  Administered 2022-01-28 – 2022-02-05 (×8): 20 mg via ORAL
  Filled 2022-01-28 (×9): qty 1

## 2022-01-28 MED ORDER — SODIUM CHLORIDE 0.9 % IV BOLUS
500.0000 mL | Freq: Once | INTRAVENOUS | Status: AC
  Administered 2022-01-28: 500 mL via INTRAVENOUS

## 2022-01-28 MED ORDER — ROSUVASTATIN CALCIUM 5 MG PO TABS
5.0000 mg | ORAL_TABLET | ORAL | Status: DC
  Administered 2022-01-29 – 2022-02-05 (×4): 5 mg via ORAL
  Filled 2022-01-28 (×5): qty 1

## 2022-01-28 MED ORDER — PANTOPRAZOLE SODIUM 40 MG IV SOLR: 40.0000 mg | Freq: Two times a day (BID) | INTRAVENOUS | Status: DC

## 2022-01-28 MED ORDER — CHLORHEXIDINE GLUCONATE CLOTH 2 % EX PADS
6.0000 | MEDICATED_PAD | Freq: Every day | CUTANEOUS | Status: DC
  Administered 2022-01-28 – 2022-02-01 (×5): 6 via TOPICAL

## 2022-01-28 MED ORDER — SODIUM CHLORIDE 0.9 % IV SOLN: INTRAVENOUS | Status: DC | PRN

## 2022-01-28 MED ORDER — PANTOPRAZOLE INFUSION (NEW) - SIMPLE MED
8.0000 mg/h | INTRAVENOUS | Status: DC
  Administered 2022-01-28 – 2022-01-30 (×5): 8 mg/h via INTRAVENOUS
  Filled 2022-01-28: qty 100
  Filled 2022-01-28 (×2): qty 80
  Filled 2022-01-28: qty 100
  Filled 2022-01-28 (×2): qty 80

## 2022-01-28 MED ORDER — PANTOPRAZOLE 80MG IVPB - SIMPLE MED
80.0000 mg | Freq: Once | INTRAVENOUS | Status: AC
  Administered 2022-01-28: 80 mg via INTRAVENOUS
  Filled 2022-01-28: qty 100

## 2022-01-28 MED ORDER — MAGNESIUM SULFATE 2 GM/50ML IV SOLN
2.0000 g | Freq: Once | INTRAVENOUS | Status: AC
  Administered 2022-01-28: 2 g via INTRAVENOUS
  Filled 2022-01-28: qty 50

## 2022-01-28 MED ORDER — ORAL CARE MOUTH RINSE: 15.0000 mL | OROMUCOSAL | Status: DC | PRN

## 2022-01-28 MED ORDER — PANTOPRAZOLE SODIUM 40 MG IV SOLR
INTRAVENOUS | Status: AC
  Filled 2022-01-28: qty 20

## 2022-01-28 MED ORDER — LEVOTHYROXINE SODIUM 25 MCG PO TABS: 25.0000 ug | ORAL_TABLET | ORAL | Status: DC

## 2022-01-28 MED ORDER — MELATONIN 3 MG PO TABS
3.0000 mg | ORAL_TABLET | Freq: Every evening | ORAL | Status: DC | PRN
  Administered 2022-01-28 – 2022-02-04 (×5): 3 mg via ORAL
  Filled 2022-01-28 (×5): qty 1

## 2022-01-28 MED ORDER — MUPIROCIN 2 % EX OINT
1.0000 | TOPICAL_OINTMENT | Freq: Two times a day (BID) | CUTANEOUS | Status: AC
  Administered 2022-01-28 – 2022-02-02 (×10): 1 via NASAL
  Filled 2022-01-28 (×5): qty 22

## 2022-01-28 MED ORDER — PANTOPRAZOLE SODIUM 40 MG IV SOLR
INTRAVENOUS | Status: AC
  Filled 2022-01-28: qty 10

## 2022-01-28 MED ORDER — LEVOTHYROXINE SODIUM 50 MCG PO TABS: 50.0000 ug | ORAL_TABLET | Freq: Every day | ORAL | Status: DC

## 2022-01-28 NOTE — ED Triage Notes (Signed)
Pt bib family for complains of fatigue and 3 black stools over past 5 days. Pt states I don't have any energy. Pt was put on potassium chloride last week and after 1st black stool daughter stopped potassium. Pt denies any abd pain. Denies any vomiting.

## 2022-01-28 NOTE — ED Notes (Signed)
ED TO INPATIENT HANDOFF REPORT  ED Nurse Name and Phone #:  Barbaraann Cao, RN [phone]  S Name/Age/Gender Latoya Curry 88 y.o. female Room/Bed: MH10/MH10  Code Status   Code Status: Prior  Home/SNF/Other ED transfer Patient oriented to: self, place, time, and situation Is this baseline? Yes   Triage Complete: Triage complete  Chief Complaint UGI bleed [K92.2]  Triage Note Pt bib family for complains of fatigue and 3 black stools over past 5 days. Pt states I don't have any energy. Pt was put on potassium chloride last week and after 1st black stool daughter stopped potassium. Pt denies any abd pain. Denies any vomiting.   Pt has not had appetite in several days.    Allergies Allergies  Allergen Reactions   Codeine Nausea And Vomiting   Levofloxacin     Other reaction(s): cardiac advised avoid/aneursym   Simvastatin Other (See Comments)    dizziness   Sulfamethoxazole-Trimethoprim Other (See Comments)     dyspepsia   Zestril [Lisinopril] Cough    Level of Care/Admitting Diagnosis ED Disposition     ED Disposition  Admit   Condition  --   Comment  Hospital Area: [facility] [[id number]]  Level of Care: Stepdown [14]  Admit to SDU based on following criteria: Hemodynamic compromise or significant risk of instability:  Patient requiring short term acute titration and management of vasoactive drips, and invasive monitoring (i.e., CVP and Arterial line).  May [facility] patient to [facility] if equivalent level of care is available:: Yes  Interfacility transfer: Yes  Covid Evaluation: Asymptomatic - no recent exposure (last 10 days) testing not required  Diagnosis: UGI bleed [267063]  Admitting Physician: Reubin Milan [[id number]]  Attending Physician: Reubin Milan [[id number]]  Certification:: I certify this patient will need inpatient services for at least 2 midnights  Estimated Length of Stay: 2           B Medical/Surgery History Past Medical History:  Diagnosis Date   Acute bronchitis 05/07/2020   Acute gangrenous appendicitis s/p lap appendectomy 06/24/2018 06/23/2018   Adjustment disorder 05/07/2020   Allergic bronchitis 05/07/2020   Allergic rhinitis 05/07/2020   Aortic regurgitation 05/10/2019   Aortic root enlargement ([place]) 08/19/2017   Appendicitis 05/07/2020   Ascending aorta dilation ([place]) 08/19/2017   Benign paroxysmal positional vertigo 05/07/2020   Bronchitis 05/07/2020   Cardiac arrhythmia 05/07/2020   Chest discomfort 08/05/2017   Chest pain 05/07/2020   Chronic low back pain    Conjunctivitis of left eye 05/07/2020   Constipation 05/07/2020   Cough 09/23/2011   Followed in Pulmonary clinic/ [facility]/ [place]   - Sinus CT 09/25/2011 > neg    Diaphoresis 05/07/2020   Diarrhea 05/07/2020   Diverticular disease of colon 05/07/2020   Diverticulitis    Diverticulitis of colon 05/07/2020   DJD (degenerative joint disease) of knee    Eczema 05/07/2020   Encounter for general adult medical examination without abnormal findings 05/07/2020   Epistaxis 05/07/2020   Essential hypertension 08/05/2017   Fever 05/07/2020   Gastro-esophageal reflux disease without esophagitis 05/07/2020   Graves' disease 05/07/2020   Hearing loss 05/07/2020   History of diverticulitis 05/07/2020   Hypercholesteremia    Hypertension    Hyperthyroidism 05/07/2020   Hypokalemia 06/24/2018   Incarcerated incisional hernia s/p primary repair 06/24/2018 06/24/2018   Incontinence without sensory awareness 05/07/2020   Insomnia 05/07/2020   Left lower quadrant pain 05/07/2020   Loss of appetite 05/07/2020  Low back pain 05/07/2020   Mixed dyslipidemia 08/05/2017   Nasal crusting 05/07/2020   Neck pain 05/07/2020   Neurodermatitis    Noninflammatory disorder of vagina 05/07/2020   Obesity    Osteopenia    Other long term (current) drug therapy 05/07/2020   Pain in left hip 03/16/2019   Pain in right hand 05/07/2020    Postnasal drip 05/07/2020   Problem related to social environment 05/07/2020   Pure hypercholesterolemia 05/07/2020   PVC (premature ventricular contraction) 08/05/2017   Rash 05/07/2020   Recurrent UTI    Right hip pain 05/07/2020   Sciatica 05/07/2020   Skin sensation disturbance 05/07/2020   Sleep disorder 05/07/2020   Small bowel obstruction, partial, s/p lap adhesiolysis 06/24/2018 06/24/2018   Spasm 05/07/2020   Thoracic aortic aneurysm [facility])    Thoracic aortic aneurysm without rupture ([place]) 05/07/2020   Thoracic aortic atherosclerosis ([place]) 01/11/2020   Thyrotoxicosis 05/07/2020   Transient global amnesia    Urinary tract infectious disease 05/07/2020   Viral syndrome 05/07/2020   Vitamin D deficiency 05/07/2020   Weight loss 05/07/2020   Past Surgical History:  Procedure Laterality Date   APPENDECTOMY     COLON RESECTION SIGMOID  2004   Dr Dalbert Batman   EXPLORATORY LAPAROTOMY  2004   r/o pancreas mass.  Dr Dalbert Batman   IR RADIOLOGIST EVAL & MGMT  07/14/2018   LAPAROSCOPIC APPENDECTOMY N/A 06/24/2018   Procedure: ACUTE GANGRENOUS APPENDICITIS WITH ABSCESS, PARTIAL SMALL BOWEL OBSTRUCTION, INCISIONAL HERNIA REPAIR, LYSIS OF ADHESIONS;  Surgeon: Michael Boston, MD;  Location: [facility];  Service: General;  Laterality: N/A;   TOTAL KNEE ARTHROPLASTY     bilaterallly     A IV Location/Drains/Wounds Patient Lines/Drains/Airways Status     Active Line/Drains/Airways     Name Placement date Placement time Site Days   Peripheral IV 01/28/22 20 G 1" Left Antecubital 01/28/22  1157  Antecubital  less than 1   Peripheral IV 01/28/22 20 G 1" Left;Posterior Forearm 01/28/22  1413  Forearm  less than 1   Closed System Drain Left Abdomen Bulb (JP) 19 Fr. 06/24/18  1402  Abdomen  1314   Closed System Drain 1 Bulb (JP) 10 Fr. 07/02/18  1520  --  1306   Incision (Closed) 06/24/18 Abdomen Other (Comment) 06/24/18  1413  -- 1314   Incision - 3 Ports Abdomen Right;Lateral Mid Left 06/24/18  1330  -- 1314             Intake/Output Last 24 hours  Intake/Output Summary (Last 24 hours) at 01/28/2022 1737 Last data filed at 01/28/2022 1618 Gross per 24 hour  Intake 687.46 ml  Output --  Net 687.46 ml    Labs/Imaging Results for orders placed or performed during the hospital encounter of 01/28/22 (from the past 48 hour(s))  Occult blood card to lab, stool     Status: Abnormal   Collection Time: 01/28/22 11:41 AM  Result Value Ref Range   Fecal Occult Bld POSITIVE (A) NEGATIVE    Comment: Performed at [facility], [address] [place], [place] [address]  CBC with Differential     Status: Abnormal   Collection Time: 01/28/22 11:59 AM  Result Value Ref Range   WBC 10.6 (H) 4.0 - 10.5 K/uL   RBC 3.26 (L) 3.87 - 5.11 MIL/uL   Hemoglobin 10.4 (L) 12.0 - 15.0 g/dL   HCT 30.0 (L) 36.0 - 46.0 %   MCV 92.0 80.0 - 100.0 fL  MCH 31.9 26.0 - 34.0 pg   MCHC 34.7 30.0 - 36.0 g/dL   RDW 13.2 11.5 - 15.5 %   Platelets 281 150 - 400 K/uL   nRBC 0.0 0.0 - 0.2 %   Neutrophils Relative % 80 %   Neutro Abs 8.4 (H) 1.7 - 7.7 K/uL   Lymphocytes Relative 14 %   Lymphs Abs 1.4 0.7 - 4.0 K/uL   Monocytes Relative 5 %   Monocytes Absolute 0.6 0.1 - 1.0 K/uL   Eosinophils Relative 0 %   Eosinophils Absolute 0.0 0.0 - 0.5 K/uL   Basophils Relative 1 %   Basophils Absolute 0.1 0.0 - 0.1 K/uL   Immature Granulocytes 0 %   Abs Immature Granulocytes 0.04 0.00 - 0.07 K/uL    Comment: Performed at [facility], [address] [place], [place] [address]  Comprehensive metabolic panel     Status: Abnormal   Collection Time: 01/28/22 11:59 AM  Result Value Ref Range   Sodium 133 (L) 135 - 145 mmol/L   Potassium 2.1 (LL) 3.5 - 5.1 mmol/L    Comment: CRITICAL RESULT CALLED TO, READ BACK BY AND VERIFIED WITH ALIE NOAH '@1300'$  01/28/22 MJU    Chloride 94 (L) 98 - 111 mmol/L   CO2 30 22 - 32 mmol/L   Glucose, Bld 135 (H) 70 - 99 mg/dL    Comment: Glucose reference range applies only to  samples taken after fasting for at least 8 hours.   BUN 46 (H) 8 - 23 mg/dL   Creatinine, Ser 1.04 (H) 0.44 - 1.00 mg/dL   Calcium 9.5 8.9 - 10.3 mg/dL   Total Protein 6.7 6.5 - 8.1 g/dL   Albumin 3.8 3.5 - 5.0 g/dL   AST 26 15 - 41 U/L   ALT 12 0 - 44 U/L   Alkaline Phosphatase 37 (L) 38 - 126 U/L   Total Bilirubin 0.7 0.3 - 1.2 mg/dL   GFR, Estimated 51 (L) >60 mL/min    Comment: (NOTE) Calculated using the CKD-EPI Creatinine Equation (2021)    Anion gap 9 5 - 15    Comment: Performed at [facility], [address] [place], [place] [address]  Magnesium     Status: None   Collection Time: 01/28/22  2:07 PM  Result Value Ref Range   Magnesium 1.8 1.7 - 2.4 mg/dL    Comment: Performed at [facility], [address] [place], [place] [address]  Hemoglobin and hematocrit, blood     Status: Abnormal   Collection Time: 01/28/22  2:07 PM  Result Value Ref Range   Hemoglobin 9.5 (L) 12.0 - 15.0 g/dL   HCT 27.3 (L) 36.0 - 46.0 %    Comment: Performed at [facility], [address] [place], [place] [address]   No results found.  Pending Labs Unresulted Labs (From admission, onward)     Start     Ordered   01/28/22 1600  Hemoglobin and hematocrit, blood  Every 6 hours,   R (with STAT occurrences)      01/28/22 1307            Vitals/Pain Today's Vitals   01/28/22 1330 01/28/22 1510 01/28/22 1515 01/28/22 1652  BP: (!) 104/58  95/65 (!) 117/57  Pulse: 73  67 69  Resp: 20  17 (!) 21  Temp: 98.2 F (36.8 C)   98.2 F (36.8 C)  TempSrc:  SpO2: 96%  94% 99%  Weight:      Height:      PainSc:  0-No pain      Isolation Precautions No active isolations  Medications Medications  pantoprozole (PROTONIX) 80 mg /NS 100 mL infusion (8 mg/hr Intravenous New Bag/Given 01/28/22 1445)  pantoprazole (PROTONIX) injection 40 mg (has no administration in time range)  0.9 %  sodium chloride infusion ( Intravenous New Bag/Given 01/28/22 1249)   potassium chloride 10 mEq in 100 mL IVPB (10 mEq Intravenous New Bag/Given 01/28/22 1728)  magnesium sulfate IVPB 2 g 50 mL (has no administration in time range)  sodium chloride 0.9 % bolus 500 mL ( Intravenous Stopped 01/28/22 1244)  pantoprazole (PROTONIX) 80 mg /NS 100 mL IVPB (80 mg Intravenous New Bag/Given 01/28/22 1249)  acetaminophen (TYLENOL) tablet 650 mg (650 mg Oral Given 01/28/22 1650)    Mobility walks with person assist Low fall risk   Focused Assessments Cardiac Assessment Handoff:    Lab Results  Component Value Date   TROPONINI <0.03 02/01/2015   No results found for: "DDIMER" Does the Patient currently have chest pain? No    R Recommendations: See Admitting Provider Note  Report given to:   Additional Notes: call me with any questions. Pt AOx4

## 2022-01-28 NOTE — ED Provider Notes (Addendum)
[place] EMERGENCY DEPARTMENT Provider Note   CSN: [id number] Arrival date & time: 01/28/22  1104     History  Chief Complaint  Patient presents with   Rectal Bleeding    Latoya Curry is a 88 y.o. female.  Patient with dark stools few days ago.  Having some generalized weakness last few days as well.  She had 1 episode of dark stool on Friday and 1 on Saturday.  Has not had a bowel movement in the last 2 days.  Called primary care doctor and they sent for evaluation.  She is on aspirin 3 times a week but otherwise no other blood thinners.  No alcohol use.  She is having some loose stools when this started but that has now improved.  She denies any abdominal pain.  She has had poor p.o. intake.  She has had some generalized weakness.  She basically just has not had a great appetite.  No history of GI bleeds in the past.  The history is provided by the patient.       Home Medications Prior to Admission medications   Medication Sig Start Date End Date Taking? Authorizing Provider  amLODipine (NORVASC) 2.5 MG tablet Take 1 tablet (2.5 mg total) by mouth daily. 12/20/21   [name], NP  aspirin 81 MG tablet Take 81 mg by mouth every Monday, Wednesday, and Friday.    [provider]  atenolol-chlorthalidone (TENORETIC) 50-25 MG tablet Take 1 tablet by mouth daily. 12/20/21   [name], NP  Budeson-Glycopyrrol-Formoterol 160-9-4.8 MCG/ACT AERO Inhale 1 puff into the lungs as needed for wheezing or shortness of breath.    [provider]  CANNABIDIOL PO Take 250 mg by mouth 2 (two) times daily.    [provider]  cetirizine (ZYRTEC) 10 MG tablet Take 10 mg by mouth daily.    [provider]  cholecalciferol (VITAMIN D) 1000 UNITS tablet Take 1,000 Units by mouth daily.    [provider]  citalopram (CELEXA) 20 MG tablet Take 1 tablet (20 mg total) by mouth daily. 12/20/21   [name], NP  COVID-19 mRNA vaccine  [phone] (COMIRNATY) syringe Inject into the muscle. 12/10/21   Carlyle Basques, MD  fesoterodine (TOVIAZ) 8 MG TB24 tablet TAKE 1 TABLET EVERY DAY 08/06/21   Franchot Gallo, MD  furosemide (LASIX) 20 MG tablet Take by mouth. 06/01/21   [provider]  influenza vaccine adjuvanted (FLUAD QUADRIVALENT) 0.5 ML injection Inject into the muscle. 12/10/21   Carlyle Basques, MD  levothyroxine (SYNTHROID) 25 MCG tablet Take 1 tablet (25 mcg total) by mouth 2 (two) times a week. 01/13/22   [name], NP  levothyroxine (SYNTHROID) 50 MCG tablet Take 50 mcg by mouth daily before breakfast.  02/15/19   [provider]  Multiple Vitamin (MULTI VITAMIN) TABS Take 2 tablets by mouth daily.    [provider]  omeprazole (PRILOSEC) 20 MG capsule Take 20 mg by mouth daily.    [provider]  potassium chloride (KLOR-CON M) 10 MEQ tablet Take 1 tablet (10 mEq total) by mouth 2 (two) times daily. 07/09/21   [name], NP  potassium chloride SA (KLOR-CON M) 20 MEQ tablet Take 1 tablet (20 mEq total) by mouth 2 (two) times daily. 01/22/22   [name], NP  rosuvastatin (CRESTOR) 5 MG tablet Take 5 mg by mouth every Monday, Wednesday, and Friday. 07/17/19   [provider]  RSV vaccine  recomb adjuvanted (AREXVY) 120 MCG/0.5ML injection Inject into the muscle. 01/08/22   Carlyle Basques, MD  traMADol (ULTRAM) 50 MG tablet Take 50 mg by mouth every 12 (twelve) hours as needed for moderate pain.    [provider]      Allergies    Codeine, Levofloxacin, Simvastatin, Sulfamethoxazole-trimethoprim, and Zestril [lisinopril]    Review of Systems   Review of Systems  Physical Exam Updated Vital Signs BP 95/65   Pulse 67   Temp 98.2 F (36.8 C)   Resp 17   Ht '5\' 5"'$  (1.651 m)   Wt 65.8 kg   SpO2 94%   BMI 24.13 kg/m  Physical Exam Vitals and nursing note reviewed.  Constitutional:      General: She is not in acute distress.    Appearance:  She is well-developed. She is not ill-appearing.  HENT:     Head: Normocephalic and atraumatic.     Nose: Nose normal.     Mouth/Throat:     Mouth: Mucous membranes are moist.  Eyes:     Extraocular Movements: Extraocular movements intact.     Conjunctiva/sclera: Conjunctivae normal.     Pupils: Pupils are equal, round, and reactive to light.  Cardiovascular:     Rate and Rhythm: Normal rate and regular rhythm.     Pulses: Normal pulses.     Heart sounds: Normal heart sounds. No murmur heard. Pulmonary:     Effort: Pulmonary effort is normal. No respiratory distress.     Breath sounds: Normal breath sounds.  Abdominal:     Palpations: Abdomen is soft.     Tenderness: There is no abdominal tenderness.  Genitourinary:    Comments: Stool appears to be brownish-black Musculoskeletal:        General: No swelling.     Cervical back: Normal range of motion and neck supple.  Skin:    General: Skin is warm and dry.     Capillary Refill: Capillary refill takes less than 2 seconds.  Neurological:     General: No focal deficit present.     Mental Status: She is alert.  Psychiatric:        Mood and Affect: Mood normal.     ED Results / Procedures / Treatments   Labs (all labs ordered are listed, but only abnormal results are displayed) Labs Reviewed  CBC WITH DIFFERENTIAL/PLATELET - Abnormal; Notable for the following components:      Result Value   WBC 10.6 (*)    RBC 3.26 (*)    Hemoglobin 10.4 (*)    HCT 30.0 (*)    Neutro Abs 8.4 (*)    All other components within normal limits  COMPREHENSIVE METABOLIC PANEL - Abnormal; Notable for the following components:   Sodium 133 (*)    Potassium 2.1 (*)    Chloride 94 (*)    Glucose, Bld 135 (*)    BUN 46 (*)    Creatinine, Ser 1.04 (*)    Alkaline Phosphatase 37 (*)    GFR, Estimated 51 (*)    All other components within normal limits  OCCULT BLOOD X 1 CARD TO LAB, STOOL - Abnormal; Notable for the following components:    Fecal Occult Bld POSITIVE (*)    All other components within normal limits  HEMOGLOBIN AND HEMATOCRIT, BLOOD - Abnormal; Notable for the following components:   Hemoglobin 9.5 (*)    HCT 27.3 (*)    All other components within normal limits  MAGNESIUM  HEMOGLOBIN  AND HEMATOCRIT, BLOOD    EKG None  Radiology No results found.  Procedures Procedures    Medications Ordered in ED Medications  pantoprozole (PROTONIX) 80 mg /NS 100 mL infusion (8 mg/hr Intravenous New Bag/Given 01/28/22 1445)  pantoprazole (PROTONIX) injection 40 mg (has no administration in time range)  0.9 %  sodium chloride infusion ( Intravenous New Bag/Given 01/28/22 1249)  potassium chloride 10 mEq in 100 mL IVPB (10 mEq Intravenous New Bag/Given 01/28/22 1507)  sodium chloride 0.9 % bolus 500 mL ( Intravenous Stopped 01/28/22 1244)  pantoprazole (PROTONIX) 80 mg /NS 100 mL IVPB (80 mg Intravenous New Bag/Given 01/28/22 1249)    ED Course/ Medical Decision Making/ A&P                           Medical Decision Making Amount and/or Complexity of Data Reviewed Labs: ordered.  Risk Prescription drug management. Decision regarding hospitalization.   KAZUE CERRO is here with dark stools.  She is on aspirin.  No other major blood thinners.  History of high cholesterol, hypertension.  Patient has dark appearing stool on exam.  Positive Hemoccult.  Hemoglobin is down from 10.4 from 14 from a couple weeks ago.  Overall my concern is for upper GI bleed.  Will start her on IV Protonix.  Will admit her to medicine.  She has no abdominal pain.  She is well-appearing otherwise.  Hemodynamically she stable.  She has had some symptoms of fatigue which I suspect is from this recent drop in hemoglobin.  Potassium is 2.1 and will replete with IV potassium. Will need GI consultation, both GI teams aware, not sure yet who will see. Hemodynamically stable throughout my care.  This chart was dictated using voice recognition  software.  Despite best efforts to proofread,  errors can occur which can change the documentation meaning.      Final Clinical Impression(s) / ED Diagnoses Final diagnoses:  Acute GI bleeding  Hypokalemia    Rx / DC Orders ED Discharge Orders     None         Lennice Sites, DO 01/28/22 [place], [place], DO 01/28/22 [place], [place], DO 01/28/22 1547

## 2022-01-28 NOTE — Telephone Encounter (Signed)
Call Type Triage / Clinical Caller Name Deb Delair Relationship To Patient Daughter Return Phone Number [phone] (Primary) Chief Complaint Blood In Stool Reason for Call Symptomatic / Request for [place] states her mother has blood in stool. She is very weak. Translation No Nurse Assessment Nurse: Lissa Merlin, RN, Abigail Date/Time (Eastern Time): 01/28/2022 9:51:57 AM Confirm and document reason for call. If symptomatic, describe symptoms. ---Caller states that she hasn't had much energy and hasn't been able to eat. States that she hasn't had an appitite and has been having blood in her stool multiple times but hasn't happened since Friday. No bleeding without stool and no pain no fever.   Final Disposition 01/28/2022 10:05:54 AM Go to ED Now Yes Lissa Merlin, RN, Abigail Caller Disagree/Comply Comply Caller Understands Yes PreDisposition InappropriateToAsk Care Advice Given Per Guideline GO TO ED NOW: * You need to be seen in the Emergency Department. * Go to the ED at ___________ [place] now. Drive carefully. CARE ADVICE given per Rectal Bleeding (Adult) guideline. Referrals [facility] - ED

## 2022-01-28 NOTE — ED Notes (Signed)
Going to bed 1241

## 2022-01-28 NOTE — H&P (Incomplete)
PCP:   [name], NP   Chief Complaint:  ***  HPI: She has had some black stools, not able to eat.   Over wkend: black bloody stool. Sat nite at restaurant, had to tinkle,m got home black diarrhea,   Called labeaer primary care. To ER Rectal positve MCHP  Weaker, no energy this AM  No CP, H/o GERD ~1wk ago. Took tums Aleve prn a couple times a wk No spicy foods  Pt c/o not peeing and feeling full over vladder. Bladder scan 918  - foley placed  Review of Systems:  The patient denies anorexia, fever, weight loss,, vision loss, decreased hearing, hoarseness, chest pain, syncope, dyspnea on exertion, peripheral edema, balance deficits, hemoptysis, abdominal pain, melena, hematochezia, severe indigestion/heartburn, hematuria, incontinence, genital sores, muscle weakness, suspicious skin lesions, transient blindness, difficulty walking, depression, unusual weight change, abnormal bleeding, enlarged lymph nodes, angioedema, and breast masses.  Past Medical History: Past Medical History:  Diagnosis Date   Acute bronchitis 05/07/2020   Acute gangrenous appendicitis s/p lap appendectomy 06/24/2018 06/23/2018   Adjustment disorder 05/07/2020   Allergic bronchitis 05/07/2020   Allergic rhinitis 05/07/2020   Aortic regurgitation 05/10/2019   Aortic root enlargement ([place]) 08/19/2017   Appendicitis 05/07/2020   Ascending aorta dilation (HCC) 08/19/2017   Benign paroxysmal positional vertigo 05/07/2020   Bronchitis 05/07/2020   Cardiac arrhythmia 05/07/2020   Chest discomfort 08/05/2017   Chest pain 05/07/2020   Chronic low back pain    Conjunctivitis of left eye 05/07/2020   Constipation 05/07/2020   Cough 09/23/2011   Followed in Pulmonary clinic/ [facility]/ [place]   - Sinus CT 09/25/2011 > neg    Diaphoresis 05/07/2020   Diarrhea 05/07/2020   Diverticular disease of colon 05/07/2020   Diverticulitis    Diverticulitis of colon 05/07/2020   DJD (degenerative joint disease) of knee     Eczema 05/07/2020   Encounter for general adult medical examination without abnormal findings 05/07/2020   Epistaxis 05/07/2020   Essential hypertension 08/05/2017   Fever 05/07/2020   Gastro-esophageal reflux disease without esophagitis 05/07/2020   Graves' disease 05/07/2020   Hearing loss 05/07/2020   History of diverticulitis 05/07/2020   Hypercholesteremia    Hypertension    Hyperthyroidism 05/07/2020   Hypokalemia 06/24/2018   Incarcerated incisional hernia s/p primary repair 06/24/2018 06/24/2018   Incontinence without sensory awareness 05/07/2020   Insomnia 05/07/2020   Left lower quadrant pain 05/07/2020   Loss of appetite 05/07/2020   Low back pain 05/07/2020   Mixed dyslipidemia 08/05/2017   Nasal crusting 05/07/2020   Neck pain 05/07/2020   Neurodermatitis    Noninflammatory disorder of vagina 05/07/2020   Obesity    Osteopenia    Other long term (current) drug therapy 05/07/2020   Pain in left hip 03/16/2019   Pain in right hand 05/07/2020   Postnasal drip 05/07/2020   Problem related to social environment 05/07/2020   Pure hypercholesterolemia 05/07/2020   PVC (premature ventricular contraction) 08/05/2017   Rash 05/07/2020   Recurrent UTI    Right hip pain 05/07/2020   Sciatica 05/07/2020   Skin sensation disturbance 05/07/2020   Sleep disorder 05/07/2020   Small bowel obstruction, partial, s/p lap adhesiolysis 06/24/2018 06/24/2018   Spasm 05/07/2020   Thoracic aortic aneurysm [facility])    Thoracic aortic aneurysm without rupture ([place]) 05/07/2020   Thoracic aortic atherosclerosis ([place]) 01/11/2020   Thyrotoxicosis 05/07/2020   Transient global amnesia    Urinary tract infectious disease 05/07/2020   Viral syndrome  05/07/2020   Vitamin D deficiency 05/07/2020   Weight loss 05/07/2020   Past Surgical History:  Procedure Laterality Date   APPENDECTOMY     COLON RESECTION SIGMOID  2004   Dr Dalbert Batman   EXPLORATORY LAPAROTOMY  2004   r/o pancreas mass.  Dr Dalbert Batman   IR RADIOLOGIST EVAL & MGMT   07/14/2018   LAPAROSCOPIC APPENDECTOMY N/A 06/24/2018   Procedure: ACUTE GANGRENOUS APPENDICITIS WITH ABSCESS, PARTIAL SMALL BOWEL OBSTRUCTION, INCISIONAL HERNIA REPAIR, LYSIS OF ADHESIONS;  Surgeon: Michael Boston, MD;  Location: [facility];  Service: General;  Laterality: N/A;   TOTAL KNEE ARTHROPLASTY     bilaterallly    Medications: Prior to Admission medications   Medication Sig Start Date End Date Taking? Authorizing Provider  amLODipine (NORVASC) 2.5 MG tablet Take 1 tablet (2.5 mg total) by mouth daily. 12/20/21   [name], NP  aspirin 81 MG tablet Take 81 mg by mouth every Monday, Wednesday, and Friday.    [provider]  atenolol-chlorthalidone (TENORETIC) 50-25 MG tablet Take 1 tablet by mouth daily. 12/20/21   [name], NP  Budeson-Glycopyrrol-Formoterol 160-9-4.8 MCG/ACT AERO Inhale 1 puff into the lungs as needed for wheezing or shortness of breath.    [provider]  CANNABIDIOL PO Take 250 mg by mouth 2 (two) times daily.    [provider]  cetirizine (ZYRTEC) 10 MG tablet Take 10 mg by mouth daily.    [provider]  cholecalciferol (VITAMIN D) 1000 UNITS tablet Take 1,000 Units by mouth daily.    [provider]  citalopram (CELEXA) 20 MG tablet Take 1 tablet (20 mg total) by mouth daily. 12/20/21   [name], NP  COVID-19 mRNA vaccine [phone] (COMIRNATY) syringe Inject into the muscle. 12/10/21   Carlyle Basques, MD  fesoterodine (TOVIAZ) 8 MG TB24 tablet TAKE 1 TABLET EVERY DAY 08/06/21   Franchot Gallo, MD  furosemide (LASIX) 20 MG tablet Take by mouth. 06/01/21   [provider]  influenza vaccine adjuvanted (FLUAD QUADRIVALENT) 0.5 ML injection Inject into the muscle. 12/10/21   Carlyle Basques, MD  levothyroxine (SYNTHROID) 25 MCG tablet Take 1 tablet (25 mcg total) by mouth 2 (two) times a week. 01/13/22   [name], NP  levothyroxine (SYNTHROID) 50 MCG tablet Take 50 mcg by mouth daily  before breakfast.  02/15/19   [provider]  Multiple Vitamin (MULTI VITAMIN) TABS Take 2 tablets by mouth daily.    [provider]  omeprazole (PRILOSEC) 20 MG capsule Take 20 mg by mouth daily.    [provider]  potassium chloride (KLOR-CON M) 10 MEQ tablet Take 1 tablet (10 mEq total) by mouth 2 (two) times daily. 07/09/21   [name], NP  potassium chloride SA (KLOR-CON M) 20 MEQ tablet Take 1 tablet (20 mEq total) by mouth 2 (two) times daily. 01/22/22   [name], NP  rosuvastatin (CRESTOR) 5 MG tablet Take 5 mg by mouth every Monday, Wednesday, and Friday. 07/17/19   [provider]  RSV vaccine recomb adjuvanted (AREXVY) 120 MCG/0.5ML injection Inject into the muscle. 01/08/22   Carlyle Basques, MD  traMADol (ULTRAM) 50 MG tablet Take 50 mg by mouth every 12 (twelve) hours as needed for moderate pain.    [provider]    Allergies:   Allergies  Allergen Reactions   Codeine Nausea And Vomiting   Levofloxacin     Other reaction(s): cardiac advised avoid/aneursym   Simvastatin Other (  See Comments)    dizziness   Sulfamethoxazole-Trimethoprim Other (See Comments)     dyspepsia   Zestril [Lisinopril] Cough    Social History:  reports that she has never smoked. She has never used smokeless tobacco. She reports that she does not drink alcohol and does not use drugs.  Family History: Family History  Problem Relation Age of Onset   Heart disease Father    Leukemia Mother     Physical Exam: Vitals:   01/28/22 1515 01/28/22 1652 01/28/22 1742 01/28/22 1752  BP: 95/65 (!) 117/57  92/66  Pulse: 67 69  76  Resp: 17 (!) 21  (!) 24  Temp:  98.2 F (36.8 C) 98.4 F (36.9 C)   TempSrc:   Oral   SpO2: 94% 99%  93%  Weight:      Height:        General:  Alert and oriented times three, well developed and nourished, no acute distress Eyes: PERRLA, pink conjunctiva, no scleral icterus ENT: Moist oral mucosa, neck supple,  no thyromegaly Lungs: clear to ascultation, no wheeze, no crackles, no use of accessory muscles Cardiovascular: regular rate and rhythm, no regurgitation, no gallops, no murmurs. No carotid bruits, no JVD Abdomen: soft, positive BS, non-tender, non-distended, no organomegaly, not an acute abdomen GU: not examined Neuro: CN II - XII grossly intact, sensation intact Musculoskeletal: strength 5/5 all extremities, no clubbing, cyanosis or edema Skin: no rash, no subcutaneous crepitation, no decubitus Psych: appropriate patient  Benign exam  Labs on Admission:  Recent Labs    01/28/22 1159 01/28/22 1407  NA 133*  --   K 2.1*  --   CL 94*  --   CO2 30  --   GLUCOSE 135*  --   BUN 46*  --   CREATININE 1.04*  --   CALCIUM 9.5  --   MG  --  1.8   Recent Labs    01/28/22 1159  AST 26  ALT 12  ALKPHOS 37*  BILITOT 0.7  PROT 6.7  ALBUMIN 3.8     Recent Labs    01/28/22 1159 01/28/22 1407  WBC 10.6*  --   NEUTROABS 8.4*  --   HGB 10.4* 9.5*  HCT 30.0* 27.3*  MCV 92.0  --   PLT 281  --       Radiological Exams on Admission: No results found.  Assessment/Plan Present on Admission:  UGI bleed  Rectal bleed -   Hypokalemia -   Mixed dyslipidemia  Hypothyroidism (acquired)  Anxiety and depression  Essential hypertension  Chronic low back pain  Hypercholesteremia  Hypertension  Graves' disease  Hyperthyroidism   Pt is [facility], need to contact in AM  Latoya Curry 01/28/2022, 7:46 PM

## 2022-01-28 NOTE — ED Triage Notes (Signed)
Pt has not had appetite in several days.

## 2022-01-28 NOTE — Telephone Encounter (Signed)
FYI  Patient also sent a mychart message yesterday.  I just routed to you.

## 2022-01-28 NOTE — Progress Notes (Addendum)
Plan of Care Note for accepted transfer   Patient: Latoya Curry MRN: [id number]   DOA: 01/28/2022  Facility requesting transfer: [facility].  Requesting Provider: Lennice Sites, DO.  Reason for transfer: UGI bleed with acute blood loss anemia. Facility course:  Per Dr. Ronnald Nian:  " Chief Complaint  Patient presents with   Rectal Bleeding    [name] is a 88 y.o. female.   Patient with dark stools few days ago.  Having some generalized weakness last few days as well.  She had 1 episode of dark stool on Friday and 1 on Saturday.  Has not had a bowel movement in the last 2 days.  Called primary care doctor and they sent for evaluation.  She is on aspirin 3 times a week but otherwise no other blood thinners.  No alcohol use.  She is having some loose stools when this started but that has now improved.  She denies any abdominal pain.  She has had poor p.o. intake.  She has had some generalized weakness.  She basically just has not had a great appetite.  No history of GI bleeds in the past.   The history is provided by the patient."  Lab work:   Component Value Units  Comprehensive metabolic panel [[id number]] (Abnormal)   Collected: 01/28/22 1159   Updated: 01/28/22 1300   Specimen Type: Blood   Specimen Source: Vein    Sodium 133 Low  mmol/L   Potassium 2.1 Low Panic  mmol/L   Chloride 94 Low  mmol/L   CO2 30 mmol/L   Glucose, Bld 135 High  mg/dL   BUN 46 High  mg/dL   Creatinine, Ser 1.04 High  mg/dL   Calcium 9.5 mg/dL   Total Protein 6.7 g/dL   Albumin 3.8 g/dL   AST 26 U/L   ALT 12 U/L   Alkaline Phosphatase 37 Low  U/L   Total Bilirubin 0.7 mg/dL   GFR, Estimated 51 Low  mL/min   Anion gap 9  CBC with Differential [[id number]] (Abnormal)   Collected: 01/28/22 1159   Updated: 01/28/22 1205   Specimen Type: Blood   Specimen Source: Vein    WBC 10.6 High  K/uL   RBC 3.26 Low  MIL/uL   Hemoglobin 10.4 Low  g/dL   HCT 30.0 Low  %   MCV 92.0 fL    MCH 31.9 pg   MCHC 34.7 g/dL   RDW 13.2 %   Platelets 281 K/uL   nRBC 0.0 %   Neutrophils Relative % 80 %   Neutro Abs 8.4 High  K/uL   Lymphocytes Relative 14 %   Lymphs Abs 1.4 K/uL   Monocytes Relative 5 %   Monocytes Absolute 0.6 K/uL   Eosinophils Relative 0 %   Eosinophils Absolute 0.0 K/uL   Basophils Relative 1 %   Basophils Absolute 0.1 K/uL   Immature Granulocytes 0 %   Abs Immature Granulocytes 0.04 K/uL   Fecal occult blood was positive.  Plan of care: The patient is accepted for admission to [facility] unit, at [facility], but may go to [facility] if a bed is available there first.  Please call [facility] when the patient arrives to either campus.  Author: Reubin Milan, MD 01/28/2022  Check [email or web address] for on-call coverage.  Nursing staff, Please call [place] number on Amion as soon as patient's arrival, so appropriate admitting provider can evaluate the pt.

## 2022-01-28 NOTE — ED Notes (Signed)
Additional warm blankets given

## 2022-01-29 ENCOUNTER — Other Ambulatory Visit: Payer: Medicare HMO

## 2022-01-29 DIAGNOSIS — E782 Mixed hyperlipidemia: Secondary | ICD-10-CM

## 2022-01-29 DIAGNOSIS — M545 Low back pain, unspecified: Secondary | ICD-10-CM

## 2022-01-29 DIAGNOSIS — E876 Hypokalemia: Secondary | ICD-10-CM | POA: Diagnosis not present

## 2022-01-29 DIAGNOSIS — K625 Hemorrhage of anus and rectum: Secondary | ICD-10-CM | POA: Diagnosis not present

## 2022-01-29 DIAGNOSIS — E039 Hypothyroidism, unspecified: Secondary | ICD-10-CM

## 2022-01-29 DIAGNOSIS — K921 Melena: Secondary | ICD-10-CM | POA: Diagnosis not present

## 2022-01-29 DIAGNOSIS — F419 Anxiety disorder, unspecified: Secondary | ICD-10-CM

## 2022-01-29 DIAGNOSIS — D62 Acute posthemorrhagic anemia: Secondary | ICD-10-CM | POA: Diagnosis not present

## 2022-01-29 DIAGNOSIS — Z9049 Acquired absence of other specified parts of digestive tract: Secondary | ICD-10-CM

## 2022-01-29 DIAGNOSIS — K922 Gastrointestinal hemorrhage, unspecified: Secondary | ICD-10-CM | POA: Diagnosis not present

## 2022-01-29 DIAGNOSIS — R5383 Other fatigue: Secondary | ICD-10-CM

## 2022-01-29 DIAGNOSIS — F32A Depression, unspecified: Secondary | ICD-10-CM

## 2022-01-29 DIAGNOSIS — I10 Essential (primary) hypertension: Secondary | ICD-10-CM

## 2022-01-29 DIAGNOSIS — G8929 Other chronic pain: Secondary | ICD-10-CM

## 2022-01-29 DIAGNOSIS — E7800 Pure hypercholesterolemia, unspecified: Secondary | ICD-10-CM

## 2022-01-29 LAB — URINALYSIS, ROUTINE W REFLEX MICROSCOPIC
Bilirubin Urine: NEGATIVE
Glucose, UA: NEGATIVE mg/dL
Ketones, ur: NEGATIVE mg/dL
Nitrite: NEGATIVE
Protein, ur: 30 mg/dL — AB
Specific Gravity, Urine: 1.01 (ref 1.005–1.030)
pH: 5 (ref 5.0–8.0)

## 2022-01-29 LAB — BASIC METABOLIC PANEL
Anion gap: 10 (ref 5–15)
Anion gap: 7 (ref 5–15)
BUN: 30 mg/dL — ABNORMAL HIGH (ref 8–23)
BUN: 21 mg/dL (ref 8–23)
CO2: 25 mmol/L (ref 22–32)
CO2: 23 mmol/L (ref 22–32)
Calcium: 8.7 mg/dL — ABNORMAL LOW (ref 8.9–10.3)
Calcium: 8.3 mg/dL — ABNORMAL LOW (ref 8.9–10.3)
Chloride: 100 mmol/L (ref 98–111)
Chloride: 104 mmol/L (ref 98–111)
Creatinine, Ser: 0.93 mg/dL (ref 0.44–1.00)
Creatinine, Ser: 0.87 mg/dL (ref 0.44–1.00)
GFR, Estimated: 59 mL/min — ABNORMAL LOW (ref >60)
GFR, Estimated: >60 mL/min (ref >60)
Glucose, Bld: 86 mg/dL (ref 70–99)
Glucose, Bld: 127 mg/dL — ABNORMAL HIGH (ref 70–99)
Potassium: 2.8 mmol/L — ABNORMAL LOW (ref 3.5–5.1)
Potassium: 3.7 mmol/L (ref 3.5–5.1)
Sodium: 135 mmol/L (ref 135–145)
Sodium: 134 mmol/L — ABNORMAL LOW (ref 135–145)

## 2022-01-29 LAB — HEMOGLOBIN AND HEMATOCRIT, BLOOD
HCT: 27 % — ABNORMAL LOW (ref 36.0–46.0)
HCT: 30.5 % — ABNORMAL LOW (ref 36.0–46.0)
HCT: 26.5 % — ABNORMAL LOW (ref 36.0–46.0)
Hemoglobin: 8.9 g/dL — ABNORMAL LOW (ref 12.0–15.0)
Hemoglobin: 9.4 g/dL — ABNORMAL LOW (ref 12.0–15.0)
Hemoglobin: 8.7 g/dL — ABNORMAL LOW (ref 12.0–15.0)

## 2022-01-29 LAB — MAGNESIUM: Magnesium: 2.3 mg/dL (ref 1.7–2.4)

## 2022-01-29 MED ORDER — TRAMADOL HCL 50 MG PO TABS
50.0000 mg | ORAL_TABLET | Freq: Two times a day (BID) | ORAL | Status: AC | PRN
  Administered 2022-01-29 (×2): 50 mg via ORAL
  Filled 2022-01-29 (×2): qty 1

## 2022-01-29 MED ORDER — POTASSIUM CHLORIDE 10 MEQ/100ML IV SOLN
10.0000 meq | INTRAVENOUS | Status: AC
  Administered 2022-01-29 (×5): 10 meq via INTRAVENOUS
  Filled 2022-01-29 (×5): qty 100

## 2022-01-29 MED ORDER — POTASSIUM CHLORIDE IN NACL 20-0.9 MEQ/L-% IV SOLN
INTRAVENOUS | Status: AC
  Filled 2022-01-29: qty 1000

## 2022-01-29 MED ORDER — LEVOTHYROXINE SODIUM 50 MCG PO TABS
50.0000 ug | ORAL_TABLET | ORAL | Status: DC
  Administered 2022-01-29 – 2022-02-05 (×6): 50 ug via ORAL
  Filled 2022-01-29 (×6): qty 1

## 2022-01-29 MED ORDER — ACETAMINOPHEN 650 MG RE SUPP: 650.0000 mg | Freq: Four times a day (QID) | RECTAL | Status: DC | PRN

## 2022-01-29 MED ORDER — ONDANSETRON HCL 4 MG/2ML IJ SOLN: 4.0000 mg | Freq: Four times a day (QID) | INTRAMUSCULAR | Status: DC | PRN

## 2022-01-29 MED ORDER — ONDANSETRON HCL 4 MG PO TABS: 4.0000 mg | ORAL_TABLET | Freq: Four times a day (QID) | ORAL | Status: DC | PRN

## 2022-01-29 MED ORDER — ACETAMINOPHEN 325 MG PO TABS
650.0000 mg | ORAL_TABLET | Freq: Four times a day (QID) | ORAL | Status: DC | PRN
  Administered 2022-01-31 – 2022-02-03 (×2): 650 mg via ORAL
  Filled 2022-01-29 (×2): qty 2

## 2022-01-29 MED ORDER — ACETAMINOPHEN 500 MG PO TABS
500.0000 mg | ORAL_TABLET | Freq: Three times a day (TID) | ORAL | Status: DC
  Administered 2022-01-29 – 2022-02-05 (×19): 500 mg via ORAL
  Filled 2022-01-29 (×20): qty 1

## 2022-01-29 MED ORDER — LEVOTHYROXINE SODIUM 50 MCG PO TABS
75.0000 ug | ORAL_TABLET | ORAL | Status: DC
  Administered 2022-01-30 – 2022-02-03 (×2): 75 ug via ORAL
  Filled 2022-01-29 (×3): qty 1

## 2022-01-29 NOTE — Progress Notes (Addendum)
       Overnight   NAME: Latoya Curry MRN: [id number] DOB : Aug 27, 1930    Date of Service   01/29/2022   HPI/Events of Note   Notified by RN for (chronic back) Rt Hip pain/Sciatica Patient has a history of chronic lower back pain, sciatica, Bilateral knee surgery.    Ordered Home dose Tramadol for the immediate.   Interventions/ Plan   Instructed on repositioning with knee support ( prior knee arthroplasty - Bilateral, Sciatica, Lower back pain) Ordered home dose Tramadol       Gershon Cull BSN MSNA MSN ACNPC-AG Acute Care Nurse Practitioner [place]

## 2022-01-29 NOTE — Anesthesia Preprocedure Evaluation (Signed)
Anesthesia Evaluation  Patient identified by MRN, date of birth, ID band Patient awake    Reviewed: Allergy & Precautions, NPO status , Patient's Chart, lab work & pertinent test results  History of Anesthesia Complications Negative for: history of anesthetic complications  Airway Mallampati: III  TM Distance: >3 FB Neck ROM: Full    Dental   Denies loose teeth.:   Pulmonary neg pulmonary ROS   Pulmonary exam normal breath sounds clear to auscultation       Cardiovascular hypertension, Pt. on medications (-) angina (-) Past MI and (-) Cardiac Stents + dysrhythmias (PVCs, 1st degree AV block) + Valvular Problems/Murmurs (moderate AI, mild MR) AI and MR  Rhythm:Regular Rate:Normal  Thoracic aortic aneurysm, HLD  CT Angio Chest 02/19/2021: IMPRESSION: 1. Aneurysmal ascending thoracic aorta, measuring 5.6 x 5.4 cm (previously 5.4 x 5.3 cm). Of note, greater than 5 mm growth over the past 12 months is associated with an increased risk of aneurysm rupture. Recommend cardiothoracic/vascular surgery referral if not already obtained.  From CT surgery note 03/18/2021: Because of the patient's advanced age and comorbid condition she is not a surgical candidate for repair of her thoracic aortic aneurysm.  Her risk for aortic tear/dissection remains about 10%  Low-risk stress test 08/18/2017  TTE 08/18/2017: Study Conclusions   - Left ventricle: The cavity size was normal. There was mild focal    basal hypertrophy of the septum. Systolic function was normal.    The estimated ejection fraction was in the range of 50% to 55%.    Wall motion was normal; there were no regional wall motion    abnormalities. Doppler parameters are consistent with abnormal    left ventricular relaxation (grade 1 diastolic dysfunction).    Doppler parameters are consistent with indeterminate ventricular    filling pressure.  - Aortic valve: Valve mobility was  restricted. Transvalvular    velocity was within the normal range. There was no stenosis.    There was moderate regurgitation. Regurgitation pressure    half-time: 390 ms.  - Aorta: Ascending aortic diameter: 51 mm (S).  - Ascending aorta: The ascending aorta was severely dilated.  - Mitral valve: Transvalvular velocity was within the normal range.    There was no evidence for stenosis. There was mild regurgitation.  - Right ventricle: The cavity size was normal. Wall thickness was    normal. Systolic function was normal.  - Atrial septum: No defect or patent foramen ovale was identified.  - Pulmonary arteries: Systolic pressure was within the normal    range. PA peak pressure: 30 mm Hg (S).     Neuro/Psych  PSYCHIATRIC DISORDERS Anxiety Depression    BPPV  Neuromuscular disease (sciatica, back pain)    GI/Hepatic Neg liver ROS,GERD  ,,  Endo/Other  neg diabetesHypothyroidism  Graves' disease  Renal/GU negative Renal ROS     Musculoskeletal  (+) Arthritis ,    Abdominal  (+) - obese  Peds  Hematology negative hematology ROS (+)   Anesthesia Other Findings Repeat K 4.0  Reproductive/Obstetrics                             Anesthesia Physical Anesthesia Plan  ASA: 4  Anesthesia Plan: MAC   Post-op Pain Management:    Induction: Intravenous  PONV Risk Score and Plan: 2 and Propofol infusion and Treatment may vary due to age or medical condition  Airway Management Planned: Natural Airway and Nasal Cannula  Additional Equipment:   Intra-op Plan:   Post-operative Plan:   Informed Consent: I have reviewed the patients History and Physical, chart, labs and discussed the procedure including the risks, benefits and alternatives for the proposed anesthesia with the patient or authorized representative who has indicated his/her understanding and acceptance.   Patient has DNR.  Discussed DNR with patient and Continue DNR.   Dental advisory  given  Plan Discussed with: CRNA and Anesthesiologist  Anesthesia Plan Comments: (Discussed with patient risks of MAC including, but not limited to, minor pain or discomfort, hearing people in the room, and possible need for backup general anesthesia. Risks for general anesthesia also discussed including, but not limited to, sore throat, hoarse voice, chipped/damaged teeth, injury to vocal cords, nausea and vomiting, allergic reactions, lung infection, heart attack, stroke, and death. Discussed risks of adverse cardiac events, including death given the patient's co-morbidities. The patient expressed understanding and wishes to continue her DNR. All questions answered. )        Anesthesia Quick Evaluation

## 2022-01-29 NOTE — Consult Note (Addendum)
Consultation Note   Referring Provider:  [facility] PCP: [name], NP Primary Gastroenterologist: Elinor Parkinson PCP)  Reason for consultation: GI bleed  Hospital Day: 2  Assessment    Patient profile:  Latoya Curry is a 88 y.o. female with a past medical history significant for HTN, HLD, Grave's disease treated with radiation, appendectomy, sigmoid colon resection, diverticulosis, GERD  . See PMH for any additional medical problems. Admitted  # 88 yo female with fatigue melena, acute blood loss anemia. Hgb 14 in November>> 8.9 now. No bleeding or any BMs in last 4 days. Rule out PUD in setting of NSAIDS.   # Remote sigmoid resection, for ?   #  Thoracic aortic aneurysm  # See PMH for additional medical problems  Plan   Continue PPI infusion Trend Hgb, transfuse for <7 She will need an EGD tomorrow when hypokalemia resolved ( Mg+ and K+ repletion in progress.  Will give clear liquids today, NPO after MN  -------------------------------------------------------------------------------------------------------------------------------------------------  Patient seen and evaluated as well.  Agree with the nurse practitioner note.  The patient has melena and acute blood loss anemia related to that and EGD is appropriate.  Could be NSAID/salicylate ulcer or ulcers or gastritis.  We are unable to schedule this until Friday. She [facility] a history [facility] - Dr. Sammuel Cooper - GI care will be transferred to [facility] - Dr. Michail Sermon has agreed to this and he will do EGD 12/8.  Plans reviewed with patient daughter and son-in-law.  Will allow soft diet today as no EGD for 2 days.  She has not had a stool in several days either so bleeding seems to have stopped.  Gatha Mayer, MD, [facility] See Shea Evans on call - gastroenterology for best contact person 01/29/2022 12:04 PM    HPI   Latoya Curry presented  to ED 12/5 for fatigue and black stools. Son-in -law and helps with the history we he got from patient's daughter. Patient tells me she had one black stool on Sat ( 4 days ago). Per Son-in-law she had a few black BMs over a few days but last one was on Saturday. No bismuth or oral iron use. Patient takes a baby asa TIW. She takes Aleve anywhere from 1-3 times a week. She hasn't had any nausea / vomiting or abdominal pain. She hasn't had an appetite "for a while" and reports about a 10 pound unintentional weight loss    Significant studies:    FOBT+ K+ 2.4>> 2.8 today BUN 46, Cr 1.04 WBC 10.5 Hgb 10.4 >> 8.9 today ( it was 14.4 in mid Nov)  Previous GI Evaluation      Recent Labs and Imaging  Labs:  Recent Labs    01/28/22 1159 01/28/22 1407 01/28/22 2132 01/29/22 0248  WBC 10.6*  --   --   --   HGB 10.4* 9.5* 10.1* 8.9*  HCT 30.0* 27.3* 30.7* 27.0*  PLT 281  --   --   --    Recent Labs    01/28/22 1159 01/28/22 2132 01/29/22 0248  NA 133* 133* 135  K 2.1* 2.8* 2.8*  CL 94* 100 100  CO2 '30 22 25  '$ GLUCOSE 135* 96  86  BUN 46* 30* 30*  CREATININE 1.04* 0.97 0.93  CALCIUM 9.5 8.7* 8.7*   Recent Labs    01/28/22 1159  PROT 6.7  ALBUMIN 3.8  AST 26  ALT 12  ALKPHOS 61*  BILITOT 0.7     Past Medical History:  Diagnosis Date   Acute bronchitis 05/07/2020   Acute gangrenous appendicitis s/p lap appendectomy 06/24/2018 06/23/2018   Adjustment disorder 05/07/2020   Allergic bronchitis 05/07/2020   Allergic rhinitis 05/07/2020   Aortic regurgitation 05/10/2019   Aortic root enlargement ([place]) 08/19/2017   Appendicitis 05/07/2020   Ascending aorta dilation ([facility]) 08/19/2017   Benign paroxysmal positional vertigo 05/07/2020   Bronchitis 05/07/2020   Cardiac arrhythmia 05/07/2020   Chest discomfort 08/05/2017   Chest pain 05/07/2020   Chronic low back pain    Conjunctivitis of left eye 05/07/2020   Constipation 05/07/2020   Cough 09/23/2011   Followed in Pulmonary clinic/  [facility]/ [place]   - Sinus CT 09/25/2011 > neg    Diaphoresis 05/07/2020   Diarrhea 05/07/2020   Diverticular disease of colon 05/07/2020   Diverticulitis    Diverticulitis of colon 05/07/2020   DJD (degenerative joint disease) of knee    Eczema 05/07/2020   Encounter for general adult medical examination without abnormal findings 05/07/2020   Epistaxis 05/07/2020   Essential hypertension 08/05/2017   Fever 05/07/2020   Gastro-esophageal reflux disease without esophagitis 05/07/2020   Graves' disease 05/07/2020   Hearing loss 05/07/2020   History of diverticulitis 05/07/2020   Hypercholesteremia    Hypertension    Hyperthyroidism 05/07/2020   Hypokalemia 06/24/2018   Incarcerated incisional hernia s/p primary repair 06/24/2018 06/24/2018   Incontinence without sensory awareness 05/07/2020   Insomnia 05/07/2020   Left lower quadrant pain 05/07/2020   Loss of appetite 05/07/2020   Low back pain 05/07/2020   Mixed dyslipidemia 08/05/2017   Nasal crusting 05/07/2020   Neck pain 05/07/2020   Neurodermatitis    Noninflammatory disorder of vagina 05/07/2020   Obesity    Osteopenia    Other long term (current) drug therapy 05/07/2020   Pain in left hip 03/16/2019   Pain in right hand 05/07/2020   Postnasal drip 05/07/2020   Problem related to social environment 05/07/2020   Pure hypercholesterolemia 05/07/2020   PVC (premature ventricular contraction) 08/05/2017   Rash 05/07/2020   Recurrent UTI    Right hip pain 05/07/2020   Sciatica 05/07/2020   Skin sensation disturbance 05/07/2020   Sleep disorder 05/07/2020   Small bowel obstruction, partial, s/p lap adhesiolysis 06/24/2018 06/24/2018   Spasm 05/07/2020   Thoracic aortic aneurysm [facility])    Thoracic aortic aneurysm without rupture ([place]) 05/07/2020   Thoracic aortic atherosclerosis ([place]) 01/11/2020   Thyrotoxicosis 05/07/2020   Transient global amnesia    Urinary tract infectious disease 05/07/2020   Viral syndrome 05/07/2020   Vitamin D deficiency  05/07/2020   Weight loss 05/07/2020    Past Surgical History:  Procedure Laterality Date   APPENDECTOMY     COLON RESECTION SIGMOID  2004   Dr Dalbert Batman   EXPLORATORY LAPAROTOMY  2004   r/o pancreas mass.  Dr Dalbert Batman   IR RADIOLOGIST EVAL & MGMT  07/14/2018   LAPAROSCOPIC APPENDECTOMY N/A 06/24/2018   Procedure: ACUTE GANGRENOUS APPENDICITIS WITH ABSCESS, PARTIAL SMALL BOWEL OBSTRUCTION, INCISIONAL HERNIA REPAIR, LYSIS OF ADHESIONS;  Surgeon: Michael Boston, MD;  Location: [facility];  Service: General;  Laterality: N/A;   TOTAL KNEE ARTHROPLASTY     bilaterallly  Family History  Problem Relation Age of Onset   Heart disease Father    Leukemia Mother     Prior to Admission medications   Medication Sig Start Date End Date Taking? Authorizing Provider  ALEVE 220 MG tablet Take 220 mg by mouth daily as needed (for pain).   Yes [provider]  amLODipine (NORVASC) 2.5 MG tablet Take 1 tablet (2.5 mg total) by mouth daily. 12/20/21  Yes [name], NP  aspirin 81 MG tablet Take 81 mg by mouth every Monday, Wednesday, and Friday.   Yes [provider]  atenolol-chlorthalidone (TENORETIC) 50-25 MG tablet Take 1 tablet by mouth daily. 12/20/21  Yes [name], NP  Budeson-Glycopyrrol-Formoterol (BREZTRI AEROSPHERE) 160-9-4.8 MCG/ACT AERO Inhale 1 puff into the lungs daily as needed (for flares).   Yes [provider]  CANNABIDIOL PO Place 250 mg under the tongue 2 (two) times daily.   Yes [provider]  cetirizine (ZYRTEC) 10 MG tablet Take 10 mg by mouth daily.   Yes [provider]  Cholecalciferol (VITAMIN D3) 25 MCG (1000 UT) CHEW Chew 1,000 Units by mouth daily.   Yes [provider]  citalopram (CELEXA) 20 MG tablet Take 1 tablet (20 mg total) by mouth daily. 12/20/21  Yes [name], NP  clobetasol cream (TEMOVATE) 1.54 % Apply 1 Application topically 2 (two) times daily as needed (for irritation).   Yes [provider]  fesoterodine (TOVIAZ) 8 MG TB24 tablet TAKE 1 TABLET EVERY DAY Patient taking differently: Take 8 mg by mouth at bedtime. 08/06/21  Yes Dahlstedt, Annie Main, MD  furosemide (LASIX) 20 MG tablet Take 20 mg by mouth every Monday, Wednesday, and Friday. 06/01/21  Yes [provider]  levothyroxine (SYNTHROID) 25 MCG tablet Take 1 tablet (25 mcg total) by mouth 2 (two) times a week. Patient taking differently: Take 25 mcg by mouth See admin instructions. Take 25 mcg by mouth in the morning before breakfast on Mon/Thurs only (in addition to the base dose of 50 mcg) 01/13/22  Yes [name], NP  levothyroxine (SYNTHROID) 50 MCG tablet Take 50 mcg by mouth daily before breakfast.  02/15/19  Yes [provider]  Multiple Vitamin (MULTI VITAMIN) TABS Take 1 tablet by mouth daily with breakfast.   Yes [provider]  omeprazole (PRILOSEC) 20 MG capsule Take 20 mg by mouth daily as needed (for reflux).   Yes [provider]  potassium chloride SA (KLOR-CON M) 20 MEQ tablet Take 1 tablet (20 mEq total) by mouth 2 (two) times daily. 01/22/22  Yes [name], NP  rosuvastatin (CRESTOR) 5 MG tablet Take 5 mg by mouth every Monday, Wednesday, and Friday. 07/17/19  Yes [provider]  traMADol (ULTRAM) 50 MG tablet Take 50 mg by mouth every 12 (twelve) hours as needed for moderate pain.   Yes [provider]  COVID-19 mRNA vaccine [phone] (COMIRNATY) syringe Inject into the muscle. Patient not taking: Reported on 01/28/2022 12/10/21   Carlyle Basques, MD  influenza vaccine adjuvanted (FLUAD QUADRIVALENT) 0.5 ML injection Inject into the muscle. Patient not taking: Reported on 01/28/2022 12/10/21   Carlyle Basques, MD  potassium chloride (KLOR-CON M) 10 MEQ tablet Take 1 tablet (10 mEq total) by mouth 2 (two) times daily. Patient not taking: Reported on 01/28/2022 07/09/21   Caleen Jobs B, NP  RSV vaccine recomb adjuvanted (AREXVY) 120  MCG/0.5ML injection Inject into the muscle. Patient not taking: Reported on 01/28/2022 01/08/22  Carlyle Basques, MD    Current Facility-Administered Medications  Medication Dose Route Frequency Provider Last Rate Last Admin   0.9 %  sodium chloride infusion   Intravenous PRN Ronnald Nian, Adam, DO   Stopped at 01/29/22 0355   0.9 % NaCl with KCl 20 mEq/ L  infusion   Intravenous Continuous Quintella Baton, MD 75 mL/hr at 01/29/22 0741 Infusion Verify at 01/29/22 0741   acetaminophen (TYLENOL) tablet 650 mg  650 mg Oral Q6H PRN Quintella Baton, MD       Or   acetaminophen (TYLENOL) suppository 650 mg  650 mg Rectal Q6H PRN Claria Dice, Debby, MD       Chlorhexidine Gluconate Cloth 2 % PADS 6 each  6 each Topical Daily Reubin Milan, MD   6 each at 01/28/22 1841   citalopram (CELEXA) tablet 20 mg  20 mg Oral Daily Crosley, Debby, MD   20 mg at 01/28/22 2221   levothyroxine (SYNTHROID) tablet 50 mcg  50 mcg Oral Once per day on Sun Tue Wed Fri Sat Quintella Baton, MD   50 mcg at 01/29/22 0531   And   [START ON 01/30/2022] levothyroxine (SYNTHROID) tablet 75 mcg  75 mcg Oral Once per day on Mon Thu Crosley, Debby, MD       melatonin tablet 3 mg  3 mg Oral QHS PRN Quintella Baton, MD   3 mg at 01/28/22 2221   mupirocin ointment (BACTROBAN) 2 % 1 Application  1 Application Nasal BID Quintella Baton, MD   1 Application at 85/88/50 2222   ondansetron (ZOFRAN) tablet 4 mg  4 mg Oral Q6H PRN Quintella Baton, MD       Or   ondansetron (ZOFRAN) injection 4 mg  4 mg Intravenous Q6H PRN Quintella Baton, MD       Oral care mouth rinse  15 mL Mouth Rinse PRN Reubin Milan, MD       [START ON 02/01/2022] pantoprazole (PROTONIX) injection 40 mg  40 mg Intravenous Q12H Curatolo, Adam, DO       pantoprozole (PROTONIX) 80 mg /NS 100 mL infusion  8 mg/hr Intravenous Continuous Curatolo, Adam, DO 10 mL/hr at 01/29/22 0741 8 mg/hr at 01/29/22 0741   rosuvastatin (CRESTOR) tablet 5 mg  5 mg Oral Q M,W,F Crosley, Debby,  MD       traMADol (ULTRAM) tablet 50 mg  50 mg Oral Q12H PRN Kathryne Eriksson, NP   50 mg at 01/29/22 0129    Allergies as of 01/28/2022 - Review Complete 01/28/2022  Allergen Reaction Noted   Levofloxacin Other (See Comments) 04/18/2020   Codeine Nausea And Vomiting 06/12/2016   Simvastatin Other (See Comments) 06/12/2016   Sulfamethoxazole-trimethoprim Other (See Comments) 06/12/2016   Zestril [lisinopril] Cough 06/12/2016    Social History   Socioeconomic History   Marital status: Widowed    Spouse name: Not on file   Number of children: 3   Years of education: Not on file   Highest education level: Not on file  Occupational History   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  Tobacco Use   Smoking status: Never   Smokeless tobacco: Never  Vaping Use   Vaping Use: Never used  Substance and Sexual Activity   Alcohol use: No   Drug use: Never   Sexual activity: Not on file  Other Topics Concern   Not on file  Social History Narrative   Not on file   Social Determinants of Health  Financial Resource Strain: Not on file  Food Insecurity: No Food Insecurity (01/28/2022)   Hunger Vital Sign    Worried About Running Out of Food in the Last Year: Never true    Ran Out of Food in the Last Year: Never true  Transportation Needs: No Transportation Needs (01/28/2022)   PRAPARE - Hydrologist (Medical): No    Lack of Transportation (Non-Medical): No  Physical Activity: Not on file  Stress: Not on file  Social Connections: Not on file  Intimate Partner Violence: Not At Risk (01/28/2022)   Humiliation, Afraid, Rape, and Kick questionnaire    Fear of Current or Ex-Partner: No    Emotionally Abused: No    Physically Abused: No    Sexually Abused: No    Review of Systems: All systems reviewed and negative except where noted in HPI.  Physical Exam: Vital signs in last 24 hours: Temp:  [97.8 F (36.6 C)-98.5 F (36.9 C)] 97.8 F  (36.6 C) (12/06 0800) Pulse Rate:  [64-143] 66 (12/06 0500) Resp:  [14-24] 15 (12/06 0500) BP: (91-117)/(46-74) 96/62 (12/06 0500) SpO2:  [91 %-99 %] 94 % (12/06 0500) Weight:  [65.8 kg] 65.8 kg (12/05 1120) Last BM Date :  (PTA)  General:  Alert female in NAD Psych:  Pleasant, cooperative. Normal mood and affect Eyes: Pupils equal Ears:  Normal auditory acuity Nose: No deformity, discharge or lesions Neck:  Supple, no masses felt Lungs:  Clear to auscultation.  Heart:  Regular rate, regular rhythm. No lower extremity edema Abdomen:  Soft, nondistended, nontender, active bowel sounds, no masses felt Rectal :  Deferred Msk: Symmetrical without gross deformities.  Neurologic:  Alert, oriented, grossly normal neurologically Extremities : No edema Skin:  Intact without significant lesions.    Intake/Output from previous day: 12/05 0701 - 12/06 0700 In: 3155.6 [I.V.:1305.3; IV Piggyback:1850.3] Out: 1400 [Urine:1400] Intake/Output this shift:  Total I/O In: 144.2 [I.V.:65.8; IV Piggyback:78.4] Out: -     Principal Problem:   Rectal bleed Active Problems:   Essential hypertension   Mixed dyslipidemia   Hypokalemia   Chronic low back pain   Hypercholesteremia   Hypertension   Graves' disease   Hyperthyroidism   Hypothyroidism (acquired)   Anxiety and depression   UGI bleed    Tye Savoy, NP-C @  01/29/2022, 8:48 AM

## 2022-01-29 NOTE — H&P (View-Only) (Signed)
Consultation Note   Referring Provider:  [facility] PCP: [name], NP Primary Gastroenterologist: Elinor Parkinson PCP)  Reason for consultation: GI bleed  Hospital Day: 2  Assessment    Patient profile:  Latoya Curry is a 88 y.o. female with a past medical history significant for HTN, HLD, Grave's disease treated with radiation, appendectomy, sigmoid colon resection, diverticulosis, GERD  . See PMH for any additional medical problems. Admitted  # 88 yo female with fatigue melena, acute blood loss anemia. Hgb 14 in November>> 8.9 now. No bleeding or any BMs in last 4 days. Rule out PUD in setting of NSAIDS.   # Remote sigmoid resection, for ?   #  Thoracic aortic aneurysm  # See PMH for additional medical problems  Plan   Continue PPI infusion Trend Hgb, transfuse for <7 She will need an EGD tomorrow when hypokalemia resolved ( Mg+ and K+ repletion in progress.  Will give clear liquids today, NPO after MN  -------------------------------------------------------------------------------------------------------------------------------------------------  Patient seen and evaluated as well.  Agree with the nurse practitioner note.  The patient has melena and acute blood loss anemia related to that and EGD is appropriate.  Could be NSAID/salicylate ulcer or ulcers or gastritis.  We are unable to schedule this until Friday. [facility] - Dr. Sammuel Cooper - GI care will be transferred to [facility] - Dr. Michail Sermon has agreed to this and he will do EGD 12/8.  Plans reviewed with patient daughter and son-in-law.  Will allow soft diet today as no EGD for 2 days.  She has not had a stool in several days either so bleeding seems to have stopped.  Gatha Mayer, MD, [facility] See Shea Evans on call - gastroenterology for best contact person 01/29/2022 12:04 PM    HPI   [name] presented  to ED 12/5 for fatigue and black stools. Son-in -law and helps with the history we he got from patient's daughter. Patient tells me she had one black stool on Sat ( 4 days ago). Per Son-in-law she had a few black BMs over a few days but last one was on Saturday. No bismuth or oral iron use. Patient takes a baby asa TIW. She takes Aleve anywhere from 1-3 times a week. She hasn't had any nausea / vomiting or abdominal pain. She hasn't had an appetite "for a while" and reports about a 10 pound unintentional weight loss    Significant studies:    FOBT+ K+ 2.4>> 2.8 today BUN 46, Cr 1.04 WBC 10.5 Hgb 10.4 >> 8.9 today ( it was 14.4 in mid Nov)  Previous GI Evaluation      Recent Labs and Imaging  Labs:  Recent Labs    01/28/22 1159 01/28/22 1407 01/28/22 2132 01/29/22 0248  WBC 10.6*  --   --   --   HGB 10.4* 9.5* 10.1* 8.9*  HCT 30.0* 27.3* 30.7* 27.0*  PLT 281  --   --   --    Recent Labs    01/28/22 1159 01/28/22 2132 01/29/22 0248  NA 133* 133* 135  K 2.1* 2.8* 2.8*  CL 94* 100 100  CO2 '30 22 25  '$ GLUCOSE 135* 96  86  BUN 46* 30* 30*  CREATININE 1.04* 0.97 0.93  CALCIUM 9.5 8.7* 8.7*   Recent Labs    01/28/22 1159  PROT 6.7  ALBUMIN 3.8  AST 26  ALT 12  ALKPHOS 63*  BILITOT 0.7     Past Medical History:  Diagnosis Date   Acute bronchitis 05/07/2020   Acute gangrenous appendicitis s/p lap appendectomy 06/24/2018 06/23/2018   Adjustment disorder 05/07/2020   Allergic bronchitis 05/07/2020   Allergic rhinitis 05/07/2020   Aortic regurgitation 05/10/2019   Aortic root enlargement ([place]) 08/19/2017   Appendicitis 05/07/2020   Ascending aorta dilation (HCC) 08/19/2017   Benign paroxysmal positional vertigo 05/07/2020   Bronchitis 05/07/2020   Cardiac arrhythmia 05/07/2020   Chest discomfort 08/05/2017   Chest pain 05/07/2020   Chronic low back pain    Conjunctivitis of left eye 05/07/2020   Constipation 05/07/2020   Cough 09/23/2011   Followed in Pulmonary clinic/  [facility]/ [place]   - Sinus CT 09/25/2011 > neg    Diaphoresis 05/07/2020   Diarrhea 05/07/2020   Diverticular disease of colon 05/07/2020   Diverticulitis    Diverticulitis of colon 05/07/2020   DJD (degenerative joint disease) of knee    Eczema 05/07/2020   Encounter for general adult medical examination without abnormal findings 05/07/2020   Epistaxis 05/07/2020   Essential hypertension 08/05/2017   Fever 05/07/2020   Gastro-esophageal reflux disease without esophagitis 05/07/2020   Graves' disease 05/07/2020   Hearing loss 05/07/2020   History of diverticulitis 05/07/2020   Hypercholesteremia    Hypertension    Hyperthyroidism 05/07/2020   Hypokalemia 06/24/2018   Incarcerated incisional hernia s/p primary repair 06/24/2018 06/24/2018   Incontinence without sensory awareness 05/07/2020   Insomnia 05/07/2020   Left lower quadrant pain 05/07/2020   Loss of appetite 05/07/2020   Low back pain 05/07/2020   Mixed dyslipidemia 08/05/2017   Nasal crusting 05/07/2020   Neck pain 05/07/2020   Neurodermatitis    Noninflammatory disorder of vagina 05/07/2020   Obesity    Osteopenia    Other long term (current) drug therapy 05/07/2020   Pain in left hip 03/16/2019   Pain in right hand 05/07/2020   Postnasal drip 05/07/2020   Problem related to social environment 05/07/2020   Pure hypercholesterolemia 05/07/2020   PVC (premature ventricular contraction) 08/05/2017   Rash 05/07/2020   Recurrent UTI    Right hip pain 05/07/2020   Sciatica 05/07/2020   Skin sensation disturbance 05/07/2020   Sleep disorder 05/07/2020   Small bowel obstruction, partial, s/p lap adhesiolysis 06/24/2018 06/24/2018   Spasm 05/07/2020   Thoracic aortic aneurysm [facility])    Thoracic aortic aneurysm without rupture ([place]) 05/07/2020   Thoracic aortic atherosclerosis ([place]) 01/11/2020   Thyrotoxicosis 05/07/2020   Transient global amnesia    Urinary tract infectious disease 05/07/2020   Viral syndrome 05/07/2020   Vitamin D deficiency  05/07/2020   Weight loss 05/07/2020    Past Surgical History:  Procedure Laterality Date   APPENDECTOMY     COLON RESECTION SIGMOID  2004   Dr Dalbert Batman   EXPLORATORY LAPAROTOMY  2004   r/o pancreas mass.  Dr Dalbert Batman   IR RADIOLOGIST EVAL & MGMT  07/14/2018   LAPAROSCOPIC APPENDECTOMY N/A 06/24/2018   Procedure: ACUTE GANGRENOUS APPENDICITIS WITH ABSCESS, PARTIAL SMALL BOWEL OBSTRUCTION, INCISIONAL HERNIA REPAIR, LYSIS OF ADHESIONS;  Surgeon: Michael Boston, MD;  Location: [facility];  Service: General;  Laterality: N/A;   TOTAL KNEE ARTHROPLASTY     bilaterallly  Family History  Problem Relation Age of Onset   Heart disease Father    Leukemia Mother     Prior to Admission medications   Medication Sig Start Date End Date Taking? Authorizing Provider  ALEVE 220 MG tablet Take 220 mg by mouth daily as needed (for pain).   Yes [provider]  amLODipine (NORVASC) 2.5 MG tablet Take 1 tablet (2.5 mg total) by mouth daily. 12/20/21  Yes [name], NP  aspirin 81 MG tablet Take 81 mg by mouth every Monday, Wednesday, and Friday.   Yes [provider]  atenolol-chlorthalidone (TENORETIC) 50-25 MG tablet Take 1 tablet by mouth daily. 12/20/21  Yes [name], NP  Budeson-Glycopyrrol-Formoterol (BREZTRI AEROSPHERE) 160-9-4.8 MCG/ACT AERO Inhale 1 puff into the lungs daily as needed (for flares).   Yes [provider]  CANNABIDIOL PO Place 250 mg under the tongue 2 (two) times daily.   Yes [provider]  cetirizine (ZYRTEC) 10 MG tablet Take 10 mg by mouth daily.   Yes [provider]  Cholecalciferol (VITAMIN D3) 25 MCG (1000 UT) CHEW Chew 1,000 Units by mouth daily.   Yes [provider]  citalopram (CELEXA) 20 MG tablet Take 1 tablet (20 mg total) by mouth daily. 12/20/21  Yes [name], NP  clobetasol cream (TEMOVATE) 4.03 % Apply 1 Application topically 2 (two) times daily as needed (for irritation).   Yes [provider]  fesoterodine (TOVIAZ) 8 MG TB24 tablet TAKE 1 TABLET EVERY DAY Patient taking differently: Take 8 mg by mouth at bedtime. 08/06/21  Yes Dahlstedt, Annie Main, MD  furosemide (LASIX) 20 MG tablet Take 20 mg by mouth every Monday, Wednesday, and Friday. 06/01/21  Yes [provider]  levothyroxine (SYNTHROID) 25 MCG tablet Take 1 tablet (25 mcg total) by mouth 2 (two) times a week. Patient taking differently: Take 25 mcg by mouth See admin instructions. Take 25 mcg by mouth in the morning before breakfast on Mon/Thurs only (in addition to the base dose of 50 mcg) 01/13/22  Yes [name], NP  levothyroxine (SYNTHROID) 50 MCG tablet Take 50 mcg by mouth daily before breakfast.  02/15/19  Yes [provider]  Multiple Vitamin (MULTI VITAMIN) TABS Take 1 tablet by mouth daily with breakfast.   Yes [provider]  omeprazole (PRILOSEC) 20 MG capsule Take 20 mg by mouth daily as needed (for reflux).   Yes [provider]  potassium chloride SA (KLOR-CON M) 20 MEQ tablet Take 1 tablet (20 mEq total) by mouth 2 (two) times daily. 01/22/22  Yes [name], NP  rosuvastatin (CRESTOR) 5 MG tablet Take 5 mg by mouth every Monday, Wednesday, and Friday. 07/17/19  Yes [provider]  traMADol (ULTRAM) 50 MG tablet Take 50 mg by mouth every 12 (twelve) hours as needed for moderate pain.   Yes [provider]  COVID-19 mRNA vaccine [id number] (COMIRNATY) syringe Inject into the muscle. Patient not taking: Reported on 01/28/2022 12/10/21   Carlyle Basques, MD  influenza vaccine adjuvanted (FLUAD QUADRIVALENT) 0.5 ML injection Inject into the muscle. Patient not taking: Reported on 01/28/2022 12/10/21   Carlyle Basques, MD  potassium chloride (KLOR-CON M) 10 MEQ tablet Take 1 tablet (10 mEq total) by mouth 2 (two) times daily. Patient not taking: Reported on 01/28/2022 07/09/21   Caleen Jobs B, NP  RSV vaccine recomb adjuvanted (AREXVY) 120  MCG/0.5ML injection Inject into the muscle. Patient not taking: Reported on 01/28/2022 01/08/22  Carlyle Basques, MD    Current Facility-Administered Medications  Medication Dose Route Frequency Provider Last Rate Last Admin   0.9 %  sodium chloride infusion   Intravenous PRN Ronnald Nian, Adam, DO   Stopped at 01/29/22 0355   0.9 % NaCl with KCl 20 mEq/ L  infusion   Intravenous Continuous Quintella Baton, MD 75 mL/hr at 01/29/22 0741 Infusion Verify at 01/29/22 0741   acetaminophen (TYLENOL) tablet 650 mg  650 mg Oral Q6H PRN Quintella Baton, MD       Or   acetaminophen (TYLENOL) suppository 650 mg  650 mg Rectal Q6H PRN Claria Dice, Debby, MD       Chlorhexidine Gluconate Cloth 2 % PADS 6 each  6 each Topical Daily Reubin Milan, MD   6 each at 01/28/22 1841   citalopram (CELEXA) tablet 20 mg  20 mg Oral Daily Crosley, Debby, MD   20 mg at 01/28/22 2221   levothyroxine (SYNTHROID) tablet 50 mcg  50 mcg Oral Once per day on Sun Tue Wed Fri Sat Quintella Baton, MD   50 mcg at 01/29/22 0531   And   [START ON 01/30/2022] levothyroxine (SYNTHROID) tablet 75 mcg  75 mcg Oral Once per day on Mon Thu Crosley, Debby, MD       melatonin tablet 3 mg  3 mg Oral QHS PRN Quintella Baton, MD   3 mg at 01/28/22 2221   mupirocin ointment (BACTROBAN) 2 % 1 Application  1 Application Nasal BID Quintella Baton, MD   1 Application at 71/24/58 2222   ondansetron (ZOFRAN) tablet 4 mg  4 mg Oral Q6H PRN Quintella Baton, MD       Or   ondansetron (ZOFRAN) injection 4 mg  4 mg Intravenous Q6H PRN Quintella Baton, MD       Oral care mouth rinse  15 mL Mouth Rinse PRN Reubin Milan, MD       [START ON 02/01/2022] pantoprazole (PROTONIX) injection 40 mg  40 mg Intravenous Q12H Curatolo, Adam, DO       pantoprozole (PROTONIX) 80 mg /NS 100 mL infusion  8 mg/hr Intravenous Continuous Curatolo, Adam, DO 10 mL/hr at 01/29/22 0741 8 mg/hr at 01/29/22 0741   rosuvastatin (CRESTOR) tablet 5 mg  5 mg Oral Q M,W,F Crosley, Debby,  MD       traMADol (ULTRAM) tablet 50 mg  50 mg Oral Q12H PRN Kathryne Eriksson, NP   50 mg at 01/29/22 0129    Allergies as of 01/28/2022 - Review Complete 01/28/2022  Allergen Reaction Noted   Levofloxacin Other (See Comments) 04/18/2020   Codeine Nausea And Vomiting 06/12/2016   Simvastatin Other (See Comments) 06/12/2016   Sulfamethoxazole-trimethoprim Other (See Comments) 06/12/2016   Zestril [lisinopril] Cough 06/12/2016    Social History   Socioeconomic History   Marital status: Widowed    Spouse name: Not on file   Number of children: 3   Years of education: Not on file   Highest education level: Not on file  Occupational History   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  Tobacco Use   Smoking status: Never   Smokeless tobacco: Never  Vaping Use   Vaping Use: Never used  Substance and Sexual Activity   Alcohol use: No   Drug use: Never   Sexual activity: Not on file  Other Topics Concern   Not on file  Social History Narrative   Not on file   Social Determinants of Health  Financial Resource Strain: Not on file  Food Insecurity: No Food Insecurity (01/28/2022)   Hunger Vital Sign    Worried About Running Out of Food in the Last Year: Never true    Ran Out of Food in the Last Year: Never true  Transportation Needs: No Transportation Needs (01/28/2022)   PRAPARE - Hydrologist (Medical): No    Lack of Transportation (Non-Medical): No  Physical Activity: Not on file  Stress: Not on file  Social Connections: Not on file  Intimate Partner Violence: Not At Risk (01/28/2022)   Humiliation, Afraid, Rape, and Kick questionnaire    Fear of Current or Ex-Partner: No    Emotionally Abused: No    Physically Abused: No    Sexually Abused: No    Review of Systems: All systems reviewed and negative except where noted in HPI.  Physical Exam: Vital signs in last 24 hours: Temp:  [97.8 F (36.6 C)-98.5 F (36.9 C)] 97.8 F  (36.6 C) (12/06 0800) Pulse Rate:  [64-143] 66 (12/06 0500) Resp:  [14-24] 15 (12/06 0500) BP: (91-117)/(46-74) 96/62 (12/06 0500) SpO2:  [91 %-99 %] 94 % (12/06 0500) Weight:  [65.8 kg] 65.8 kg (12/05 1120) Last BM Date :  (PTA)  General:  Alert female in NAD Psych:  Pleasant, cooperative. Normal mood and affect Eyes: Pupils equal Ears:  Normal auditory acuity Nose: No deformity, discharge or lesions Neck:  Supple, no masses felt Lungs:  Clear to auscultation.  Heart:  Regular rate, regular rhythm. No lower extremity edema Abdomen:  Soft, nondistended, nontender, active bowel sounds, no masses felt Rectal :  Deferred Msk: Symmetrical without gross deformities.  Neurologic:  Alert, oriented, grossly normal neurologically Extremities : No edema Skin:  Intact without significant lesions.    Intake/Output from previous day: 12/05 0701 - 12/06 0700 In: 3155.6 [I.V.:1305.3; IV Piggyback:1850.3] Out: 1400 [Urine:1400] Intake/Output this shift:  Total I/O In: 144.2 [I.V.:65.8; IV Piggyback:78.4] Out: -     Principal Problem:   Rectal bleed Active Problems:   Essential hypertension   Mixed dyslipidemia   Hypokalemia   Chronic low back pain   Hypercholesteremia   Hypertension   Graves' disease   Hyperthyroidism   Hypothyroidism (acquired)   Anxiety and depression   UGI bleed    Tye Savoy, NP-C @  01/29/2022, 8:48 AM

## 2022-01-29 NOTE — TOC Initial Note (Signed)
Transition of Care [facility]) - Initial/Assessment Note    Patient Details  Name: Latoya Curry MRN: [id number] Date of Birth: Sep 06, 1930  Transition of Care [facility]) CM/SW Contact:    Dessa Phi, RN Phone Number: 01/29/2022, 10:07 AM  Clinical Narrative:  Monitor for d/c needs.                 Expected Discharge Plan:  (TBD) Barriers to Discharge: Continued Medical Work up   Patient Goals and CMS Choice Patient states their goals for this hospitalization and ongoing recovery are::  (Home) CMS Medicare.gov Compare Post Acute Care list provided to:: Patient Represenative (must comment) Choice offered to / list presented to : Spouse  Expected Discharge Plan and Services Expected Discharge Plan:  (TBD)   Discharge Planning Services: CM Consult   Living arrangements for the past 2 months: Single Family Home                                      Prior Living Arrangements/Services Living arrangements for the past 2 months: Single Family Home Lives with:: Spouse   Do you feel safe going back to the place where you live?: Yes               Activities of Daily Living Home Assistive Devices/Equipment: Hearing aid ADL Screening (condition at time of admission) Patient's cognitive ability adequate to safely complete daily activities?: Yes Is the patient deaf or have difficulty hearing?: Yes Does the patient have difficulty seeing, even when wearing glasses/contacts?: No Does the patient have difficulty concentrating, remembering, or making decisions?: No Patient able to express need for assistance with ADLs?: No Does the patient have difficulty dressing or bathing?: No Independently performs ADLs?: Yes (appropriate for developmental age) Does the patient have difficulty walking or climbing stairs?: No Weakness of Legs: None Weakness of Arms/Hands: None  Permission Sought/Granted Permission sought to share information with : Case Manager Permission granted to share  information with : Yes, Verbal Permission Granted  Share Information with NAME:  (Case Manager)           Emotional Assessment              Admission diagnosis:  Hypokalemia [E87.6] Acute GI bleeding [K92.2] UGI bleed [K92.2] Patient Active Problem List   Diagnosis Date Noted   UGI bleed 01/28/2022   Rectal bleed 01/28/2022   Hypothyroidism (acquired) 01/08/2022   Anxiety and depression 01/08/2022   Adjustment disorder 05/07/2020   Encounter for general adult medical examination without abnormal findings 05/07/2020   Appendicitis 05/07/2020   Benign paroxysmal positional vertigo 05/07/2020   Cardiac arrhythmia 05/07/2020   Conjunctivitis of left eye 05/07/2020   Constipation 05/07/2020   Diarrhea 05/07/2020   Diaphoresis 05/07/2020   Diverticular disease of colon 05/07/2020   Diverticulitis of colon 05/07/2020   Fever 05/07/2020   Gastro-esophageal reflux disease without esophagitis 05/07/2020   Graves' disease 05/07/2020   Hearing loss 05/07/2020   History of diverticulitis 05/07/2020   Insomnia 05/07/2020   Left lower quadrant pain 05/07/2020   Loss of appetite 05/07/2020   Epistaxis 05/07/2020   Nasal crusting 05/07/2020   Neck pain 05/07/2020   Noninflammatory disorder of vagina 05/07/2020   Other long term (current) drug therapy 05/07/2020   Pain in right hand 05/07/2020   Postnasal drip 05/07/2020   Problem related to social environment 05/07/2020   Rash 05/07/2020   Sciatica 05/07/2020  Skin sensation disturbance 05/07/2020   Spasm 05/07/2020   Hyperthyroidism 05/07/2020   Thyrotoxicosis 05/07/2020   Incontinence without sensory awareness 05/07/2020   Urinary tract infectious disease 05/07/2020   Viral syndrome 05/07/2020   Vitamin D deficiency 05/07/2020   Weight loss 05/07/2020   Thoracic aortic aneurysm without rupture ([place]) 05/07/2020   Chest pain 05/07/2020   Right hip pain 05/07/2020   Low back pain 05/07/2020   Pure hypercholesterolemia  05/07/2020   Acute bronchitis 05/07/2020   Allergic bronchitis 05/07/2020   Allergic rhinitis 05/07/2020   Bronchitis 05/07/2020   Eczema 05/07/2020   Sleep disorder 05/07/2020   Chronic low back pain    Diverticulitis    DJD (degenerative joint disease) of knee    Hypercholesteremia    Hypertension    Neurodermatitis    Obesity    Osteopenia    Recurrent UTI    Thoracic aortic aneurysm ([place])    Transient global amnesia    Thoracic aortic atherosclerosis ([place]) 01/11/2020   Aortic regurgitation 05/10/2019   Pain in left hip 03/16/2019   Hypokalemia 06/24/2018   Small bowel obstruction, partial, s/p lap adhesiolysis 06/24/2018 06/24/2018   Incarcerated incisional hernia s/p primary repair 06/24/2018 06/24/2018   Acute gangrenous appendicitis s/p lap appendectomy 06/24/2018 06/23/2018   Aortic root enlargement ([place]) 08/19/2017   Ascending aorta dilation ([place]) 08/19/2017   Essential hypertension 08/05/2017   Mixed dyslipidemia 08/05/2017   PVC (premature ventricular contraction) 08/05/2017   Chest discomfort 08/05/2017   Cough 09/23/2011   PCP:  [name], NP Pharmacy:   [facility] [phone] - [place], [place] - [address] AT [place] [address] [place] [address] Phone: [phone] Fax: [phone]     Social Determinants of Health (SDOH) Interventions    Readmission Risk Interventions     No data to display

## 2022-01-29 NOTE — Progress Notes (Signed)
PROGRESS NOTE    Latoya Curry  EQA:[id number] DOB: 20-Jul-1930 DOA: 01/28/2022 PCP: [name], NP    Chief Complaint  Patient presents with   Rectal Bleeding    Brief Narrative:  Patient is a pleasant 88 year old female history of hypokalemia, hypertension, diverticulosis, GERD, hypertension, dyslipidemia, PVCs presented to the ED with melanotic stools.  Patient denied any consistent NSAID use stated use Aleve occasionally for pain and aspirin 3 times weekly.  Patient called PCPs office and directed to the ED.  FOBT done was positive.  Hemoglobin noted at 9.5 on presentation with baseline hemoglobin 11.4 (01/08/2022.).  Patient also noted with urinary retention.  Patient admitted, serial CBCs obtained, GI consulted for further evaluation and management.   Assessment & Plan:   Principal Problem:   Rectal bleed Active Problems:   Essential hypertension   Mixed dyslipidemia   Hypokalemia   Chronic low back pain   Hypercholesteremia   Hypertension   Graves' disease   Hyperthyroidism   Hypothyroidism (acquired)   Anxiety and depression   UGI bleed  1 probable upper GI bleed/melanotic stools/acute blood loss anemia -Patient presented with episodes of melanotic stools, last melanotic stool 4 days ago. -Patient with history of diverticulosis. -Hemoglobin on presentation noted at 9.5 from 14.4 (01/08/2022) -Patient seen in consultation by GI and patient for probable upper endoscopy tomorrow per GI once electrolytes have been repleted. -Continue Protonix drip. -Continue serial CBCs. -Transfuse transfusion threshold Hgb < 7. -GI following and appreciate their input and recommendations.  2.  Hypokalemia -Potassium noted at 2.1 on day of admission, -Status post IV potassium and KCl and IV fluids. -Repeat potassium at 3.7.  Magnesium at 2.3. -Repeat labs in the AM.  3.  Hypothyroidism -Continue home regimen Synthroid.  4..  Urinary retention -Status post Foley catheter  placement this admission. -Continue Flomax.  5.  Depression/anxiety -Continue home regimen Celexa.  6.  Hypertension -Blood pressure noted borderline likely secondary to acute GI bleed. -Continue to hold antihypertensive medications.  7.  Hyperlipidemia -Crestor.    DVT prophylaxis: SCDs Code Status: DNR Family Communication: Updated patient, son-in-law at bedside. Disposition: Likely home when clinically improved.  Status is: Inpatient Remains inpatient appropriate because: Severity of illness   Consultants:  Gastroenterology: Dr. Carlean Purl 01/29/2022  Procedures:  None  Antimicrobials:  None   Subjective: Laying in bed.  Denies any chest pain.  No shortness of breath.  No abdominal pain.  Stated had some abdominal pressure earlier on on admission.  Denies any further melanotic stools states last melanotic stool/diarrhea was 4 days ago on Saturday.  Son-in-law at bedside.  Objective: Vitals:   01/29/22 0700 01/29/22 0800 01/29/22 0900 01/29/22 1000  BP:  (!) 103/59    Pulse: 73 74 74 71  Resp: '17 20 19 '$ (!) 22  Temp:  97.8 F (36.6 C)    TempSrc:  Oral    SpO2: 94% 93% 95% 92%  Weight:      Height:        Intake/Output Summary (Last 24 hours) at 01/29/2022 1039 Last data filed at 01/29/2022 0950 Gross per 24 hour  Intake 3483.94 ml  Output 1400 ml  Net 2083.94 ml   Filed Weights   01/28/22 1120  Weight: 65.8 kg    Examination:  General exam: Appears calm and comfortable  Respiratory system: Clear to auscultation.  No wheezes, no crackles, no rhonchi.  Fair air movement.  Speaking in full sentences.  Respiratory effort normal. Cardiovascular system: S1 &  S2 heard, RRR. No JVD, murmurs, rubs, gallops or clicks. No pedal edema. Gastrointestinal system: Abdomen is nondistended, soft and nontender. No organomegaly or masses felt. Normal bowel sounds heard. Central nervous system: Alert and oriented. No focal neurological deficits. Extremities: Symmetric 5 x  5 power. Skin: No rashes, lesions or ulcers Psychiatry: Judgement and insight appear normal. Mood & affect appropriate.     Data Reviewed: I have personally reviewed following labs and imaging studies  CBC: Recent Labs  Lab 01/28/22 1159 01/28/22 1407 01/28/22 2132 01/29/22 0248  WBC 10.6*  --   --   --   NEUTROABS 8.4*  --   --   --   HGB 10.4* 9.5* 10.1* 8.9*  HCT 30.0* 27.3* 30.7* 27.0*  MCV 92.0  --   --   --   PLT 281  --   --   --     Basic Metabolic Panel: Recent Labs  Lab 01/28/22 1159 01/28/22 1407 01/28/22 2132 01/29/22 0248  NA 133*  --  133* 135  K 2.1*  --  2.8* 2.8*  CL 94*  --  100 100  CO2 30  --  22 25  GLUCOSE 135*  --  96 86  BUN 46*  --  30* 30*  CREATININE 1.04*  --  0.97 0.93  CALCIUM 9.5  --  8.7* 8.7*  MG  --  1.8 1.8 2.3    GFR: Estimated Creatinine Clearance: 36.9 mL/min (by C-G formula based on SCr of 0.93 mg/dL).  Liver Function Tests: Recent Labs  Lab 01/28/22 1159  AST 26  ALT 12  ALKPHOS 37*  BILITOT 0.7  PROT 6.7  ALBUMIN 3.8    CBG: No results for input(s): "GLUCAP" in the last 168 hours.   Recent Results (from the past 240 hour(s))  MRSA Next Gen by PCR, Nasal     Status: Abnormal   Collection Time: 01/28/22  6:42 PM   Specimen: Nasal Mucosa; Nasal Swab  Result Value Ref Range Status   MRSA by PCR Next Gen DETECTED (A) NOT DETECTED Final    Comment: (NOTE) The GeneXpert MRSA Assay (FDA approved for NASAL specimens only), is one component of a comprehensive MRSA colonization surveillance program. It is not intended to diagnose MRSA infection nor to guide or monitor treatment for MRSA infections. Test performance is not FDA approved in patients less than 47 years old. Performed at [facility], [place] [address], [place], [place] [address]          Radiology Studies: No results found.      Scheduled Meds:  Chlorhexidine Gluconate Cloth  6 each Topical Daily   citalopram  20 mg  Oral Daily   levothyroxine  50 mcg Oral Once per day on Sun Tue Wed Fri Sat   And   [START ON 01/30/2022] levothyroxine  75 mcg Oral Once per day on Mon Thu   mupirocin ointment  1 Application Nasal BID   [START ON 02/01/2022] pantoprazole  40 mg Intravenous Q12H   rosuvastatin  5 mg Oral Q M,W,F   Continuous Infusions:  sodium chloride Stopped (01/29/22 0355)   0.9 % NaCl with KCl 20 mEq / L 75 mL/hr at 01/29/22 0950   pantoprazole 8 mg/hr (01/29/22 1023)     LOS: 1 day    Time spent: 40 minutes    Irine Seal, MD [facility]   To contact the attending provider between 7A-7P or the covering provider during after hours 7P-7A,  please log into the web site [email or web address] and access using universal [place] password for that web site. If you do not have the password, please call the hospital operator.  01/29/2022, 10:39 AM

## 2022-01-29 NOTE — Telephone Encounter (Signed)
Pt admitted

## 2022-01-30 ENCOUNTER — Inpatient Hospital Stay (HOSPITAL_COMMUNITY): Payer: Medicare HMO | Admitting: Anesthesiology

## 2022-01-30 ENCOUNTER — Encounter (HOSPITAL_COMMUNITY): Payer: Self-pay | Admitting: Family Medicine

## 2022-01-30 ENCOUNTER — Encounter (HOSPITAL_COMMUNITY)
Admission: EM | Disposition: A | Discharge status: Skilled Nursing Facility | Payer: Self-pay | Source: Home / Self Care | Attending: Internal Medicine

## 2022-01-30 DIAGNOSIS — K2971 Gastritis, unspecified, with bleeding: Secondary | ICD-10-CM | POA: Diagnosis not present

## 2022-01-30 DIAGNOSIS — K311 Adult hypertrophic pyloric stenosis: Secondary | ICD-10-CM

## 2022-01-30 DIAGNOSIS — K922 Gastrointestinal hemorrhage, unspecified: Secondary | ICD-10-CM | POA: Diagnosis not present

## 2022-01-30 DIAGNOSIS — E876 Hypokalemia: Secondary | ICD-10-CM | POA: Diagnosis not present

## 2022-01-30 DIAGNOSIS — F418 Other specified anxiety disorders: Secondary | ICD-10-CM | POA: Diagnosis not present

## 2022-01-30 DIAGNOSIS — N390 Urinary tract infection, site not specified: Secondary | ICD-10-CM

## 2022-01-30 DIAGNOSIS — D62 Acute posthemorrhagic anemia: Secondary | ICD-10-CM

## 2022-01-30 DIAGNOSIS — K625 Hemorrhage of anus and rectum: Secondary | ICD-10-CM | POA: Diagnosis not present

## 2022-01-30 DIAGNOSIS — I10 Essential (primary) hypertension: Secondary | ICD-10-CM

## 2022-01-30 DIAGNOSIS — F419 Anxiety disorder, unspecified: Secondary | ICD-10-CM | POA: Diagnosis not present

## 2022-01-30 HISTORY — PX: ESOPHAGOGASTRODUODENOSCOPY (EGD) WITH PROPOFOL: SHX5813

## 2022-01-30 HISTORY — PX: BIOPSY: SHX5522

## 2022-01-30 LAB — CBC WITH DIFFERENTIAL/PLATELET
Abs Immature Granulocytes: 0.03 10*3/uL (ref 0.00–0.07)
Basophils Absolute: 0.1 10*3/uL (ref 0.0–0.1)
Basophils Relative: 1 %
Eosinophils Absolute: 0.5 10*3/uL (ref 0.0–0.5)
Eosinophils Relative: 6 %
HCT: 26.7 % — ABNORMAL LOW (ref 36.0–46.0)
Hemoglobin: 8.7 g/dL — ABNORMAL LOW (ref 12.0–15.0)
Immature Granulocytes: 0 %
Lymphocytes Relative: 22 %
Lymphs Abs: 2 10*3/uL (ref 0.7–4.0)
MCH: 31.9 pg (ref 26.0–34.0)
MCHC: 32.6 g/dL (ref 30.0–36.0)
MCV: 97.8 fL (ref 80.0–100.0)
Monocytes Absolute: 0.7 10*3/uL (ref 0.1–1.0)
Monocytes Relative: 7 %
Neutro Abs: 6 10*3/uL (ref 1.7–7.7)
Neutrophils Relative %: 64 %
Platelets: 212 10*3/uL (ref 150–400)
RBC: 2.73 MIL/uL — ABNORMAL LOW (ref 3.87–5.11)
RDW: 14.1 % (ref 11.5–15.5)
WBC: 9.3 10*3/uL (ref 4.0–10.5)
nRBC: 0 % (ref 0.0–0.2)

## 2022-01-30 LAB — BASIC METABOLIC PANEL
Anion gap: 8 (ref 5–15)
Anion gap: 8 (ref 5–15)
BUN: 15 mg/dL (ref 8–23)
BUN: 11 mg/dL (ref 8–23)
CO2: 25 mmol/L (ref 22–32)
CO2: 26 mmol/L (ref 22–32)
Calcium: 8.6 mg/dL — ABNORMAL LOW (ref 8.9–10.3)
Calcium: 8.4 mg/dL — ABNORMAL LOW (ref 8.9–10.3)
Chloride: 104 mmol/L (ref 98–111)
Chloride: 102 mmol/L (ref 98–111)
Creatinine, Ser: 0.9 mg/dL (ref 0.44–1.00)
Creatinine, Ser: 0.81 mg/dL (ref 0.44–1.00)
GFR, Estimated: >60 mL/min (ref >60)
GFR, Estimated: >60 mL/min (ref >60)
Glucose, Bld: 97 mg/dL (ref 70–99)
Glucose, Bld: 102 mg/dL — ABNORMAL HIGH (ref 70–99)
Potassium: 2.7 mmol/L — CL (ref 3.5–5.1)
Potassium: 3.7 mmol/L (ref 3.5–5.1)
Sodium: 137 mmol/L (ref 135–145)
Sodium: 136 mmol/L (ref 135–145)

## 2022-01-30 LAB — CBC
HCT: 27.2 % — ABNORMAL LOW (ref 36.0–46.0)
Hemoglobin: 9 g/dL — ABNORMAL LOW (ref 12.0–15.0)
MCH: 32.7 pg (ref 26.0–34.0)
MCHC: 33.1 g/dL (ref 30.0–36.0)
MCV: 98.9 fL (ref 80.0–100.0)
Platelets: 213 10*3/uL (ref 150–400)
RBC: 2.75 MIL/uL — ABNORMAL LOW (ref 3.87–5.11)
RDW: 14.3 % (ref 11.5–15.5)
WBC: 10.4 10*3/uL (ref 4.0–10.5)
nRBC: 0 % (ref 0.0–0.2)

## 2022-01-30 LAB — POCT I-STAT, CHEM 8
BUN: 10 mg/dL (ref 8–23)
Calcium, Ion: 1.13 mmol/L — ABNORMAL LOW (ref 1.15–1.40)
Chloride: 99 mmol/L (ref 98–111)
Creatinine, Ser: 0.9 mg/dL (ref 0.44–1.00)
Glucose, Bld: 98 mg/dL (ref 70–99)
HCT: 29 % — ABNORMAL LOW (ref 36.0–46.0)
Hemoglobin: 9.9 g/dL — ABNORMAL LOW (ref 12.0–15.0)
Potassium: 4 mmol/L (ref 3.5–5.1)
Sodium: 135 mmol/L (ref 135–145)
TCO2: 25 mmol/L (ref 22–32)

## 2022-01-30 LAB — URINE CULTURE: Culture: NO GROWTH

## 2022-01-30 LAB — MAGNESIUM: Magnesium: 2 mg/dL (ref 1.7–2.4)

## 2022-01-30 SURGERY — ESOPHAGOGASTRODUODENOSCOPY (EGD) WITH PROPOFOL
Anesthesia: Monitor Anesthesia Care

## 2022-01-30 MED ORDER — TRAMADOL HCL 50 MG PO TABS
50.0000 mg | ORAL_TABLET | Freq: Two times a day (BID) | ORAL | Status: DC | PRN
  Administered 2022-01-30 – 2022-02-02 (×5): 50 mg via ORAL
  Filled 2022-01-30 (×5): qty 1

## 2022-01-30 MED ORDER — SODIUM CHLORIDE 0.9 % IV SOLN: INTRAVENOUS | Status: DC

## 2022-01-30 MED ORDER — SODIUM CHLORIDE 0.9 % IV SOLN
2.0000 g | INTRAVENOUS | Status: DC
  Administered 2022-01-30 – 2022-02-01 (×3): 2 g via INTRAVENOUS
  Filled 2022-01-30 (×3): qty 20

## 2022-01-30 MED ORDER — PROPOFOL 500 MG/50ML IV EMUL
INTRAVENOUS | Status: DC | PRN
  Administered 2022-01-30: 10 mg via INTRAVENOUS
  Administered 2022-01-30: 20 mg via INTRAVENOUS
  Administered 2022-01-30: 75 ug/kg/min via INTRAVENOUS

## 2022-01-30 MED ORDER — POTASSIUM CHLORIDE CRYS ER 10 MEQ PO TBCR
40.0000 meq | EXTENDED_RELEASE_TABLET | ORAL | Status: AC
  Administered 2022-01-30 (×2): 40 meq via ORAL
  Filled 2022-01-30 (×2): qty 4

## 2022-01-30 MED ORDER — POTASSIUM CHLORIDE 10 MEQ/100ML IV SOLN
10.0000 meq | INTRAVENOUS | Status: DC
  Administered 2022-01-30: 10 meq via INTRAVENOUS
  Filled 2022-01-30: qty 100

## 2022-01-30 MED ORDER — POTASSIUM CHLORIDE 10 MEQ/100ML IV SOLN
10.0000 meq | INTRAVENOUS | Status: DC
  Administered 2022-01-30 (×2): 10 meq via INTRAVENOUS
  Filled 2022-01-30 (×2): qty 100

## 2022-01-30 MED ORDER — LIDOCAINE 2% (20 MG/ML) 5 ML SYRINGE
INTRAMUSCULAR | Status: DC | PRN
  Administered 2022-01-30: 50 mg via INTRAVENOUS

## 2022-01-30 MED ORDER — LACTATED RINGERS IV SOLN: INTRAVENOUS | Status: DC | PRN

## 2022-01-30 MED ORDER — PANTOPRAZOLE SODIUM 40 MG IV SOLR
40.0000 mg | Freq: Two times a day (BID) | INTRAVENOUS | Status: DC
  Administered 2022-01-30 – 2022-02-02 (×7): 40 mg via INTRAVENOUS
  Filled 2022-01-30 (×7): qty 10

## 2022-01-30 SURGICAL SUPPLY — 15 items

## 2022-01-30 NOTE — Op Note (Signed)
[facility] Patient Name: Latoya Curry Procedure Date: 01/30/2022 MRN: [id number] Attending MD: [name] , MD, [id number] Date of Birth: 10/25/1930 CSN: [id number] Age: 88 Admit Type: Inpatient Procedure:                Upper GI endoscopy Indications:              Acute post hemorrhagic anemia, Melena Providers:                [name], MD, Jeanella Cara, RN,                            William Dalton, Technician Referring MD:             hospital team Medicines:                Propofol per Anesthesia, Monitored Anesthesia Care Complications:            No immediate complications. Estimated Blood Loss:     Estimated blood loss was minimal. Procedure:                Pre-Anesthesia Assessment:                           - Prior to the procedure, a History and Physical                            was performed, and patient medications and                            allergies were reviewed. The patient's tolerance of                            previous anesthesia was also reviewed. The risks                            and benefits of the procedure and the sedation                            options and risks were discussed with the patient.                            All questions were answered, and informed consent                            was obtained. Prior Anticoagulants: The patient has                            taken no anticoagulant or antiplatelet agents. ASA                            Grade Assessment: IV - A patient with severe                            systemic disease that is a constant threat to life.  After reviewing the risks and benefits, the patient                            was deemed in satisfactory condition to undergo the                            procedure.                           After obtaining informed consent, the endoscope was                            passed under direct vision.  Throughout the                            procedure, the patient's blood pressure, pulse, and                            oxygen saturations were monitored continuously. The                            [id number]-[id number] ([id number]) Olympus endoscope was introduced                            through the mouth, and advanced to the pylorus. The                            [id number]-[id number] ([id number]) Olympus slim endoscope was                            introduced through the mouth, and advanced to the                            second part of duodenum. The upper GI endoscopy was                            performed with difficulty due to stenosis.                            Successful completion of the procedure was aided by                            withdrawing the scope and replacing with the                            pediatric endoscope. The patient tolerated the                            procedure well. Scope In: Scope Out: Findings:      The examined esophagus was normal.      The Z-line was regular and was found 40 cm from the incisors.      Segmental moderate inflammation characterized by congestion (edema),       erythema and linear erosions was found in the  gastric antrum. Biopsies       were taken with a cold forceps for histology. Estimated blood loss was       minimal.      A benign-appearing, intrinsic severe stenosis was found at the pylorus.       Standard endoscope (9.2 mm) could not traverse into the duodenal bulb.       This was traversed after changing to a pediatric endoscope (5.4 mm).      The examined duodenum was normal.      There is no endoscopic evidence of ulceration in the entire examined       stomach. Impression:               - Normal esophagus.                           - Z-line regular, 40 cm from the incisors.                           - Gastritis. Biopsied.                           - Gastric stenosis was found at the pylorus.                           - Normal examined  duodenum. Moderate Sedation:      N/A - MAC procedure Recommendation:           - Await pathology results.                           - Observe patient's clinical course.                           - Clear liquid diet. Procedure Code(s):        --- Professional ---                           [phone], Esophagogastroduodenoscopy, flexible,                            transoral; with biopsy, single or multiple Diagnosis Code(s):        --- Professional ---                           K92.1, Melena (includes Hematochezia)                           K29.70, Gastritis, unspecified, without bleeding                           D62, Acute posthemorrhagic anemia                           K31.1, Adult hypertrophic pyloric stenosis CPT copyright 2022 American Medical Association. All rights reserved. The codes documented in this report are preliminary and upon coder review may  be revised to meet current compliance requirements. [name], MD 01/30/2022 1:51:43 PM This report has been signed electronically. Number of Addenda: 0

## 2022-01-30 NOTE — Transfer of Care (Signed)
Immediate Anesthesia Transfer of Care Note  Patient: Latoya Curry  Procedure(s) Performed: ESOPHAGOGASTRODUODENOSCOPY (EGD) WITH PROPOFOL BIOPSY  Patient Location: PACU and Endoscopy Unit  Anesthesia Type:MAC  Level of Consciousness: awake, alert , oriented, and patient cooperative  Airway & Oxygen Therapy: Patient Spontanous Breathing and Patient connected to face mask oxygen  Post-op Assessment: Report given to RN, Post -op Vital signs reviewed and stable, and Patient moving all extremities  Post vital signs: Reviewed and stable  Last Vitals:  Vitals Value Taken Time  BP    Temp    Pulse    Resp    SpO2      Last Pain:  Vitals:   01/30/22 1239  TempSrc: Temporal  PainSc: 0-No pain         Complications: No notable events documented.

## 2022-01-30 NOTE — Anesthesia Procedure Notes (Signed)
Procedure Name: MAC Date/Time: 01/30/2022 1:12 PM  Performed by: Niel Hummer, CRNAPre-anesthesia Checklist: Patient identified, Emergency Drugs available, Suction available and Patient being monitored Oxygen Delivery Method: Simple face mask

## 2022-01-30 NOTE — Brief Op Note (Signed)
01/28/2022 - 01/30/2022  2:15 PM  PATIENT:  Latoya Curry  88 y.o. female  PRE-OPERATIVE DIAGNOSIS:  Melena, anemia  POST-OPERATIVE DIAGNOSIS:  gastritis- biopsied  PROCEDURE:  Procedure(s): ESOPHAGOGASTRODUODENOSCOPY (EGD) WITH PROPOFOL (N/A) BIOPSY  SURGEON:  Surgeon(s) and Role:    Wilford Corner, MD - Primary  Pyloric channel stenosis for unclear reasons. No intrinsic process seen. Will do CT abd/pelvis with IV contrast without oral contrast to evaluate for an extrinsic process. Clear liquid diet. NPO p MN for CT. Supportive care. See procedure note for complete details.

## 2022-01-30 NOTE — Progress Notes (Addendum)
1915 patient alert able to make all need known Bilateral hearing aide removed by patient and placed in charging container at bedside. Patient informed that she's to be NPO at midnight for AM CT scan  2300 patient yelling out in her sleep   01/31/22 0145 patient yelling out that her hip hurt patient concerned that the pain in the hip is from the bed repositioned and PRN meds given   0235 patient yelling out unaware that she's screaming when asked if she was ok

## 2022-01-30 NOTE — Interval H&P Note (Signed)
History and Physical Interval Note:  01/30/2022 1:08 PM  Latoya Curry  has presented today for surgery, with the diagnosis of Melena, anemia.  The various methods of treatment have been discussed with the patient and family. After consideration of risks, benefits and other options for treatment, the patient has consented to  Procedure(s): ESOPHAGOGASTRODUODENOSCOPY (EGD) WITH PROPOFOL (N/A) as a surgical intervention.  The patient's history has been reviewed, patient examined, no change in status, stable for surgery.  I have reviewed the patient's chart and labs.  Questions were answered to the patient's satisfaction.     [name]

## 2022-01-30 NOTE — Anesthesia Postprocedure Evaluation (Signed)
Anesthesia Post Note  Patient: Latoya Curry  Procedure(s) Performed: ESOPHAGOGASTRODUODENOSCOPY (EGD) WITH PROPOFOL BIOPSY     Patient location during evaluation: PACU Anesthesia Type: MAC Level of consciousness: awake Pain management: pain level controlled Vital Signs Assessment: post-procedure vital signs reviewed and stable Respiratory status: spontaneous breathing, nonlabored ventilation and respiratory function stable Cardiovascular status: stable and blood pressure returned to baseline Postop Assessment: no apparent nausea or vomiting Anesthetic complications: no   No notable events documented.  Last Vitals:  Vitals:   01/30/22 1400 01/30/22 1410  BP: (!) 109/59 (!) 113/54  Pulse: 76 74  Resp: (!) 24 (!) 22  Temp:    SpO2: 92% 92%    Last Pain:  Vitals:   01/30/22 1410  TempSrc:   PainSc: 0-No pain                 Nilda Simmer

## 2022-01-30 NOTE — Progress Notes (Signed)
PROGRESS NOTE    Latoya Curry  OZH:[id number] DOB: 1930-10-16 DOA: 01/28/2022 PCP: [name], NP    Chief Complaint  Patient presents with   Rectal Bleeding    Brief Narrative:  Patient is a pleasant 88 year old female history of hypokalemia, hypertension, diverticulosis, GERD, hypertension, dyslipidemia, PVCs presented to the ED with melanotic stools.  Patient denied any consistent NSAID use stated use Aleve occasionally for pain and aspirin 3 times weekly.  Patient called PCPs office and directed to the ED.  FOBT done was positive.  Hemoglobin noted at 9.5 on presentation with baseline hemoglobin 11.4 (01/08/2022.).  Patient also noted with urinary retention.  Patient admitted, serial CBCs obtained, GI consulted for further evaluation and management.   Assessment & Plan:   Principal Problem:   Rectal bleed Active Problems:   Essential hypertension   Mixed dyslipidemia   Hypokalemia   Chronic low back pain   Hypercholesteremia   Hypertension   Graves' disease   Hyperthyroidism   Hypothyroidism (acquired)   Anxiety and depression   UGI bleed   Acute GI bleeding  1 probable upper GI bleed/melanotic stools/acute blood loss anemia -Patient presented with episodes of melanotic stools, last melanotic stool 5 days ago. -Patient with history of diverticulosis. -Hemoglobin on presentation noted at 9.5 from 14.4 (01/08/2022) -Patient seen in consultation by GI and patient for upper endoscopy today per GI once electrolytes have been repleted. -Continue Protonix drip. -Continue serial CBCs. -Transfuse transfusion threshold Hgb < 7. -GI following and appreciate their input and recommendations.  2.  Hypokalemia -Potassium noted at 2.1 on day of admission, -Status post IV potassium and KCl and IV fluids. -Repeat potassium at 3.7 yesterday afternoon..  Magnesium at 2.3. -Labs this morning with a potassium of 2.7, magnesium of 2.0. -K-Dur 40 mEq p.o. every 4 hours x 2  doses. -KCl 10 mEq IV every hour x 3 rounds. -Repeat labs this afternoon.  3.  Hypothyroidism -Synthroid.  4..  Urinary retention -Status post Foley catheter placement this admission. -Urinalysis with concerns for possible UTI. -Continue Flomax. -Voiding trial in 3 to 5 days.  5.  Depression/anxiety -Continue home regimen Celexa.  6.  Hypertension -Blood pressure noted borderline likely secondary to acute GI bleed. -Continue to hold antihypertensive medications. -Monitor BP on Flomax. -Gentle hydration with IV fluids.  7.  Hyperlipidemia -Continue Crestor.  8.  Probable UTI -Patient noted to urinary retention on admission. -Urinalysis with moderate leukocytes, nitrite negative, many bacteria, 11-20 WBCs. -Urine cultures pending. -IV Rocephin pending urine cultures.    DVT prophylaxis: SCDs Code Status: DNR Family Communication: Updated patient, daughter, son-in-law at bedside. Disposition: Likely home when clinically improved and cleared by GI.Marland Kitchen  Status is: Inpatient Remains inpatient appropriate because: Severity of illness   Consultants:  Gastroenterology: Dr. Carlean Purl 01/29/2022  Procedures:  None  Antimicrobials:  IV Rocephin 01/30/2022>>>>   Subjective: Laying in bed.  Daughter and son-in-law at bedside.  No chest pain.  No shortness of breath.  No abdominal pain.  No bowel movement since admission per patient.  Denies any further melanotic stools.   Objective: Vitals:   01/30/22 0500 01/30/22 0600 01/30/22 0800 01/30/22 0818  BP:   (!) 98/49 108/62  Pulse: 62 61 63   Resp: '14 16 15   '$ Temp:   98.9 F (37.2 C)   TempSrc:   Oral   SpO2: 94% 92% 93%   Weight:      Height:        Intake/Output Summary (  Last 24 hours) at 01/30/2022 0922 Last data filed at 01/30/2022 0855 Gross per 24 hour  Intake 977.57 ml  Output 1550 ml  Net -572.43 ml    Filed Weights   01/28/22 1120  Weight: 65.8 kg    Examination:  General exam: NAD.  Dry mucous  membranes. Respiratory system: Clear to auscultation bilaterally.  No wheezes, no crackles, no rhonchi.  Fair air movement.  Speaking in full sentences.   Cardiovascular system: Regular rate rhythm no murmurs rubs or gallops.  No JVD.  No lower extremity edema.  Gastrointestinal system: Abdomen is soft, nontender, nondistended, positive bowel sounds.  No rebound.  No guarding.  Central nervous system: Alert and oriented. No focal neurological deficits. Extremities: Symmetric 5 x 5 power. Skin: No rashes, lesions or ulcers Psychiatry: Judgement and insight appear normal. Mood & affect appropriate.     Data Reviewed: I have personally reviewed following labs and imaging studies  CBC: Recent Labs  Lab 01/28/22 1159 01/28/22 1407 01/28/22 2132 01/29/22 0248 01/29/22 1043 01/29/22 1846 01/30/22 0255  WBC 10.6*  --   --   --   --   --  9.3  NEUTROABS 8.4*  --   --   --   --   --  6.0  HGB 10.4*   < > 10.1* 8.9* 9.4* 8.7* 8.7*  HCT 30.0*   < > 30.7* 27.0* 30.5* 26.5* 26.7*  MCV 92.0  --   --   --   --   --  97.8  PLT 281  --   --   --   --   --  212   < > = values in this interval not displayed.     Basic Metabolic Panel: Recent Labs  Lab 01/28/22 1159 01/28/22 1407 01/28/22 2132 01/29/22 0248 01/29/22 1229 01/30/22 0255  NA 133*  --  133* 135 134* 137  K 2.1*  --  2.8* 2.8* 3.7 2.7*  CL 94*  --  100 100 104 104  CO2 30  --  '22 25 23 25  '$ GLUCOSE 135*  --  96 86 127* 97  BUN 46*  --  30* 30* 21 15  CREATININE 1.04*  --  0.97 0.93 0.87 0.90  CALCIUM 9.5  --  8.7* 8.7* 8.3* 8.6*  MG  --  1.8 1.8 2.3  --  2.0     GFR: Estimated Creatinine Clearance: 38.1 mL/min (by C-G formula based on SCr of 0.9 mg/dL).  Liver Function Tests: Recent Labs  Lab 01/28/22 1159  AST 26  ALT 12  ALKPHOS 37*  BILITOT 0.7  PROT 6.7  ALBUMIN 3.8     CBG: No results for input(s): "GLUCAP" in the last 168 hours.   Recent Results (from the past 240 hour(s))  MRSA Next Gen by PCR,  Nasal     Status: Abnormal   Collection Time: 01/28/22  6:42 PM   Specimen: Nasal Mucosa; Nasal Swab  Result Value Ref Range Status   MRSA by PCR Next Gen DETECTED (A) NOT DETECTED Final    Comment: (NOTE) The GeneXpert MRSA Assay (FDA approved for NASAL specimens only), is one component of a comprehensive MRSA colonization surveillance program. It is not intended to diagnose MRSA infection nor to guide or monitor treatment for MRSA infections. Test performance is not FDA approved in patients less than 77 years old. Performed at [facility], [place] [address], [place], [place] [address]   Urine Culture  Status: None   Collection Time: 01/29/22  9:03 AM   Specimen: Urine, Catheterized  Result Value Ref Range Status   Specimen Description   Final    URINE, CATHETERIZED Performed at [place] [address] [place], [place] [address]    Special Requests   Final    NONE Performed at [facility], [place] [address] [place], [place] [address]    Culture   Final    NO GROWTH Performed at [facility], [place] [address] [place], [place] [address]    Report Status 01/30/2022 FINAL  Final         Radiology Studies: No results found.      Scheduled Meds:  acetaminophen  500 mg Oral TID   Chlorhexidine Gluconate Cloth  6 each Topical Daily   citalopram  20 mg Oral Daily   levothyroxine  50 mcg Oral Once per day on Sun Tue Wed Fri Sat   And   levothyroxine  75 mcg Oral Once per day on Mon Thu   mupirocin ointment  1 Application Nasal BID   [START ON 02/01/2022] pantoprazole  40 mg Intravenous Q12H   potassium chloride  40 mEq Oral Q4H   rosuvastatin  5 mg Oral Q M,W,F   Continuous Infusions:  sodium chloride 30 mL/hr at 01/30/22 0855   cefTRIAXone (ROCEPHIN)  IV     pantoprazole 8 mg/hr (01/30/22 0855)     LOS: 2 days    Time spent: 40 minutes    Irine Seal, MD [facility]   To  contact the attending provider between 7A-7P or the covering provider during after hours 7P-7A, please log into the web site [email or web address] and access using universal [place] password for that web site. If you do not have the password, please call the hospital operator.  01/30/2022, 9:22 AM

## 2022-01-31 ENCOUNTER — Inpatient Hospital Stay (HOSPITAL_COMMUNITY): Payer: Medicare HMO

## 2022-01-31 DIAGNOSIS — E876 Hypokalemia: Secondary | ICD-10-CM | POA: Diagnosis not present

## 2022-01-31 DIAGNOSIS — K922 Gastrointestinal hemorrhage, unspecified: Secondary | ICD-10-CM | POA: Diagnosis not present

## 2022-01-31 DIAGNOSIS — R339 Retention of urine, unspecified: Secondary | ICD-10-CM

## 2022-01-31 DIAGNOSIS — K2970 Gastritis, unspecified, without bleeding: Secondary | ICD-10-CM

## 2022-01-31 DIAGNOSIS — K625 Hemorrhage of anus and rectum: Secondary | ICD-10-CM | POA: Diagnosis not present

## 2022-01-31 DIAGNOSIS — E872 Acidosis, unspecified: Secondary | ICD-10-CM

## 2022-01-31 DIAGNOSIS — K3189 Other diseases of stomach and duodenum: Secondary | ICD-10-CM

## 2022-01-31 DIAGNOSIS — F419 Anxiety disorder, unspecified: Secondary | ICD-10-CM | POA: Diagnosis not present

## 2022-01-31 DIAGNOSIS — N1339 Other hydronephrosis: Secondary | ICD-10-CM

## 2022-01-31 LAB — RENAL FUNCTION PANEL
Albumin: 2.8 g/dL — ABNORMAL LOW (ref 3.5–5.0)
Anion gap: 6 (ref 5–15)
BUN: 10 mg/dL (ref 8–23)
CO2: 27 mmol/L (ref 22–32)
Calcium: 8.1 mg/dL — ABNORMAL LOW (ref 8.9–10.3)
Chloride: 100 mmol/L (ref 98–111)
Creatinine, Ser: 0.92 mg/dL (ref 0.44–1.00)
GFR, Estimated: 60 mL/min — ABNORMAL LOW (ref >60)
Glucose, Bld: 94 mg/dL (ref 70–99)
Phosphorus: 2.6 mg/dL (ref 2.5–4.6)
Potassium: 3.7 mmol/L (ref 3.5–5.1)
Sodium: 133 mmol/L — ABNORMAL LOW (ref 135–145)

## 2022-01-31 LAB — CBC
HCT: 26.5 % — ABNORMAL LOW (ref 36.0–46.0)
Hemoglobin: 8.6 g/dL — ABNORMAL LOW (ref 12.0–15.0)
MCH: 32.3 pg (ref 26.0–34.0)
MCHC: 32.5 g/dL (ref 30.0–36.0)
MCV: 99.6 fL (ref 80.0–100.0)
Platelets: 197 10*3/uL (ref 150–400)
RBC: 2.66 MIL/uL — ABNORMAL LOW (ref 3.87–5.11)
RDW: 14.6 % (ref 11.5–15.5)
WBC: 9.4 10*3/uL (ref 4.0–10.5)
nRBC: 0 % (ref 0.0–0.2)

## 2022-01-31 LAB — HEMOGLOBIN AND HEMATOCRIT, BLOOD
HCT: 28.6 % — ABNORMAL LOW (ref 36.0–46.0)
Hemoglobin: 9.3 g/dL — ABNORMAL LOW (ref 12.0–15.0)

## 2022-01-31 MED ORDER — SODIUM CHLORIDE (PF) 0.9 % IJ SOLN
INTRAMUSCULAR | Status: AC
  Administered 2022-01-31: 10 mL
  Filled 2022-01-31: qty 50

## 2022-01-31 MED ORDER — IOHEXOL 300 MG/ML  SOLN
100.0000 mL | Freq: Once | INTRAMUSCULAR | Status: AC | PRN
  Administered 2022-01-31: 100 mL via INTRAVENOUS

## 2022-01-31 NOTE — Consult Note (Incomplete)
Urology Consult   Physician requesting consult: Dr. Grandville Silos  Reason for consult: Left hydronephrosis, urinary retention  History of Present Illness: Latoya Curry is a 88 y.o. who was admitted with GI bleeding.  She was noted to have urinary retention with catheter placement.  3 days later, CT imaging incidentally revealed left hydronephrosis down to the mid ureter with no calculus identified.  She denies a history of voiding or storage urinary symptoms, hematuria, UTIs, STDs, urolithiasis, GU malignancy/trauma/surgery.  Past Medical History:  Diagnosis Date   Acute bronchitis 05/07/2020   Acute gangrenous appendicitis s/p lap appendectomy 06/24/2018 06/23/2018   Adjustment disorder 05/07/2020   Allergic bronchitis 05/07/2020   Allergic rhinitis 05/07/2020   Aortic regurgitation 05/10/2019   Aortic root enlargement ([place]) 08/19/2017   Appendicitis 05/07/2020   Ascending aorta dilation ([facility]) 08/19/2017   Benign paroxysmal positional vertigo 05/07/2020   Bronchitis 05/07/2020   Cardiac arrhythmia 05/07/2020   Chest discomfort 08/05/2017   Chest pain 05/07/2020   Chronic low back pain    Conjunctivitis of left eye 05/07/2020   Constipation 05/07/2020   Cough 09/23/2011   Followed in Pulmonary clinic/ [facility]/ [place]   - Sinus CT 09/25/2011 > neg    Diaphoresis 05/07/2020   Diarrhea 05/07/2020   Diverticular disease of colon 05/07/2020   Diverticulitis    Diverticulitis of colon 05/07/2020   DJD (degenerative joint disease) of knee    Eczema 05/07/2020   Encounter for general adult medical examination without abnormal findings 05/07/2020   Epistaxis 05/07/2020   Essential hypertension 08/05/2017   Fever 05/07/2020   Gastro-esophageal reflux disease without esophagitis 05/07/2020   Graves' disease 05/07/2020   Hearing loss 05/07/2020   History of diverticulitis 05/07/2020   Hypercholesteremia    Hypertension    Hyperthyroidism 05/07/2020   Hypokalemia 06/24/2018   Incarcerated incisional  hernia s/p primary repair 06/24/2018 06/24/2018   Incontinence without sensory awareness 05/07/2020   Insomnia 05/07/2020   Left lower quadrant pain 05/07/2020   Loss of appetite 05/07/2020   Low back pain 05/07/2020   Mixed dyslipidemia 08/05/2017   Nasal crusting 05/07/2020   Neck pain 05/07/2020   Neurodermatitis    Noninflammatory disorder of vagina 05/07/2020   Obesity    Osteopenia    Other long term (current) drug therapy 05/07/2020   Pain in left hip 03/16/2019   Pain in right hand 05/07/2020   Postnasal drip 05/07/2020   Problem related to social environment 05/07/2020   Pure hypercholesterolemia 05/07/2020   PVC (premature ventricular contraction) 08/05/2017   Rash 05/07/2020   Recurrent UTI    Right hip pain 05/07/2020   Sciatica 05/07/2020   Skin sensation disturbance 05/07/2020   Sleep disorder 05/07/2020   Small bowel obstruction, partial, s/p lap adhesiolysis 06/24/2018 06/24/2018   Spasm 05/07/2020   Thoracic aortic aneurysm [facility])    Thoracic aortic aneurysm without rupture ([place]) 05/07/2020   Thoracic aortic atherosclerosis ([place]) 01/11/2020   Thyrotoxicosis 05/07/2020   Transient global amnesia    Urinary tract infectious disease 05/07/2020   Viral syndrome 05/07/2020   Vitamin D deficiency 05/07/2020   Weight loss 05/07/2020    Past Surgical History:  Procedure Laterality Date   APPENDECTOMY     COLON RESECTION SIGMOID  2004   Dr Dalbert Batman   EXPLORATORY LAPAROTOMY  2004   r/o pancreas mass.  Dr Dalbert Batman   IR RADIOLOGIST EVAL & MGMT  07/14/2018   LAPAROSCOPIC APPENDECTOMY N/A 06/24/2018   Procedure: ACUTE GANGRENOUS APPENDICITIS WITH ABSCESS, PARTIAL SMALL  BOWEL OBSTRUCTION, INCISIONAL HERNIA REPAIR, LYSIS OF ADHESIONS;  Surgeon: Michael Boston, MD;  Location: [facility];  Service: General;  Laterality: N/A;   TOTAL KNEE ARTHROPLASTY     bilaterallly     Current Hospital Medications:  Home meds:  No current facility-administered medications on file prior to encounter.   Current  Outpatient Medications on File Prior to Encounter  Medication Sig Dispense Refill   ALEVE 220 MG tablet Take 220 mg by mouth daily as needed (for pain).     amLODipine (NORVASC) 2.5 MG tablet Take 1 tablet (2.5 mg total) by mouth daily. 90 tablet 0   aspirin 81 MG tablet Take 81 mg by mouth every Monday, Wednesday, and Friday.     atenolol-chlorthalidone (TENORETIC) 50-25 MG tablet Take 1 tablet by mouth daily. 90 tablet 0   Budeson-Glycopyrrol-Formoterol (BREZTRI AEROSPHERE) 160-9-4.8 MCG/ACT AERO Inhale 1 puff into the lungs daily as needed (for flares).     CANNABIDIOL PO Place 250 mg under the tongue 2 (two) times daily.     cetirizine (ZYRTEC) 10 MG tablet Take 10 mg by mouth daily.     Cholecalciferol (VITAMIN D3) 25 MCG (1000 UT) CHEW Chew 1,000 Units by mouth daily.     citalopram (CELEXA) 20 MG tablet Take 1 tablet (20 mg total) by mouth daily. 90 tablet 0   clobetasol cream (TEMOVATE) 4.09 % Apply 1 Application topically 2 (two) times daily as needed (for irritation).     fesoterodine (TOVIAZ) 8 MG TB24 tablet TAKE 1 TABLET EVERY DAY (Patient taking differently: Take 8 mg by mouth at bedtime.) 90 tablet 3   furosemide (LASIX) 20 MG tablet Take 20 mg by mouth every Monday, Wednesday, and Friday.     levothyroxine (SYNTHROID) 25 MCG tablet Take 1 tablet (25 mcg total) by mouth 2 (two) times a week. (Patient taking differently: Take 25 mcg by mouth See admin instructions. Take 25 mcg by mouth in the morning before breakfast on Mon/Thurs only (in addition to the base dose of 50 mcg)) 90 tablet 3   levothyroxine (SYNTHROID) 50 MCG tablet Take 50 mcg by mouth daily before breakfast.      Multiple Vitamin (MULTI VITAMIN) TABS Take 1 tablet by mouth daily with breakfast.     omeprazole (PRILOSEC) 20 MG capsule Take 20 mg by mouth daily as needed (for reflux).     potassium chloride SA (KLOR-CON M) 20 MEQ tablet Take 1 tablet (20 mEq total) by mouth 2 (two) times daily. 12 tablet 0   rosuvastatin  (CRESTOR) 5 MG tablet Take 5 mg by mouth every Monday, Wednesday, and Friday.     traMADol (ULTRAM) 50 MG tablet Take 50 mg by mouth every 12 (twelve) hours as needed for moderate pain.     COVID-19 mRNA vaccine 2023-2024 (COMIRNATY) syringe Inject into the muscle. (Patient not taking: Reported on 01/28/2022) 0.3 mL 0   influenza vaccine adjuvanted (FLUAD QUADRIVALENT) 0.5 ML injection Inject into the muscle. (Patient not taking: Reported on 01/28/2022) 0.5 mL 0   potassium chloride (KLOR-CON M) 10 MEQ tablet Take 1 tablet (10 mEq total) by mouth 2 (two) times daily. (Patient not taking: Reported on 01/28/2022) 90 tablet 1   RSV vaccine recomb adjuvanted (AREXVY) 120 MCG/0.5ML injection Inject into the muscle. (Patient not taking: Reported on 01/28/2022) 0.5 mL 0     Scheduled Meds:  acetaminophen  500 mg Oral TID   Chlorhexidine Gluconate Cloth  6 each Topical Daily   citalopram  20 mg Oral  Daily   levothyroxine  50 mcg Oral Once per day on Sun Tue Wed Fri Sat   And   levothyroxine  75 mcg Oral Once per day on Mon Thu   mupirocin ointment  1 Application Nasal BID   pantoprazole (PROTONIX) IV  40 mg Intravenous Q12H   rosuvastatin  5 mg Oral Q M,W,F   Continuous Infusions:  sodium chloride Stopped (01/30/22 1156)   sodium chloride     cefTRIAXone (ROCEPHIN)  IV Stopped (01/31/22 0818)   PRN Meds:.sodium chloride, acetaminophen **OR** acetaminophen, melatonin, ondansetron **OR** ondansetron (ZOFRAN) IV, mouth rinse, traMADol  Allergies:  Allergies  Allergen Reactions   Levofloxacin Other (See Comments)    Cardiac advised to avoid/Aneurysm   Codeine Nausea And Vomiting   Simvastatin Other (See Comments)    Dizziness    Sulfamethoxazole-Trimethoprim Other (See Comments)     Dyspepsia    Zestril [Lisinopril] Cough    Family History  Problem Relation Age of Onset   Heart disease Father    Leukemia Mother     Social History:  reports that she has never smoked. She has never used  smokeless tobacco. She reports that she does not drink alcohol and does not use drugs.  ROS: A complete review of systems was performed.  All systems are negative except for pertinent findings as noted.  Physical Exam:  Vital signs in last 24 hours: Temp:  [97.7 F (36.5 C)-98.3 F (36.8 C)] 98.1 F (36.7 C) (12/08 2000) Pulse Rate:  [64-105] 81 (12/08 2200) Resp:  [15-31] 20 (12/08 2200) BP: (91-131)/(48-78) 100/67 (12/08 2200) SpO2:  [92 %-97 %] 92 % (12/08 2200) Weight:  [67.2 kg] 67.2 kg (12/08 0940) Constitutional:  Alert and oriented, No acute distress Cardiovascular: Regular rate and rhythm, No JVD Respiratory: Normal respiratory effort, Lungs clear bilaterally GI: Abdomen is soft, nontender, nondistended, no abdominal masses GU: No CVA tenderness Lymphatic: No lymphadenopathy Neurologic: Grossly intact, no focal deficits Psychiatric: Normal mood and affect  Laboratory Data:  Recent Labs    01/30/22 0255 01/30/22 1246 01/30/22 1450 01/31/22 0247 01/31/22 1404  WBC 9.3  --  10.4 9.4  --   HGB 8.7* 9.9* 9.0* 8.6* 9.3*  HCT 26.7* 29.0* 27.2* 26.5* 28.6*  PLT 212  --  213 197  --     Recent Labs    01/29/22 0248 01/29/22 1229 01/30/22 0255 01/30/22 1246 01/30/22 1450 01/31/22 0247  NA 135 134* 137 135 136 133*  K 2.8* 3.7 2.7* 4.0 3.7 3.7  CL 100 104 104 99 102 100  GLUCOSE 86 127* 97 98 102* 94  BUN 30* '21 15 10 11 10  '$ CALCIUM 8.7* 8.3* 8.6*  --  8.4* 8.1*  CREATININE 0.93 0.87 0.90 0.90 0.81 0.92     Results for orders placed or performed during the hospital encounter of 01/28/22 (from the past 24 hour(s))  CBC     Status: Abnormal   Collection Time: 01/31/22  2:47 AM  Result Value Ref Range   WBC 9.4 4.0 - 10.5 K/uL   RBC 2.66 (L) 3.87 - 5.11 MIL/uL   Hemoglobin 8.6 (L) 12.0 - 15.0 g/dL   HCT 26.5 (L) 36.0 - 46.0 %   MCV 99.6 80.0 - 100.0 fL   MCH 32.3 26.0 - 34.0 pg   MCHC 32.5 30.0 - 36.0 g/dL   RDW 14.6 11.5 - 15.5 %   Platelets 197 150 -  400 K/uL   nRBC 0.0 0.0 - 0.2 %  Renal function panel     Status: Abnormal   Collection Time: 01/31/22  2:47 AM  Result Value Ref Range   Sodium 133 (L) 135 - 145 mmol/L   Potassium 3.7 3.5 - 5.1 mmol/L   Chloride 100 98 - 111 mmol/L   CO2 27 22 - 32 mmol/L   Glucose, Bld 94 70 - 99 mg/dL   BUN 10 8 - 23 mg/dL   Creatinine, Ser 0.92 0.44 - 1.00 mg/dL   Calcium 8.1 (L) 8.9 - 10.3 mg/dL   Phosphorus 2.6 2.5 - 4.6 mg/dL   Albumin 2.8 (L) 3.5 - 5.0 g/dL   GFR, Estimated 60 (L) >60 mL/min   Anion gap 6 5 - 15  Hemoglobin and hematocrit, blood     Status: Abnormal   Collection Time: 01/31/22  2:04 PM  Result Value Ref Range   Hemoglobin 9.3 (L) 12.0 - 15.0 g/dL   HCT 28.6 (L) 36.0 - 46.0 %   Recent Results (from the past 240 hour(s))  MRSA Next Gen by PCR, Nasal     Status: Abnormal   Collection Time: 01/28/22  6:42 PM   Specimen: Nasal Mucosa; Nasal Swab  Result Value Ref Range Status   MRSA by PCR Next Gen DETECTED (A) NOT DETECTED Final    Comment: (NOTE) The GeneXpert MRSA Assay (FDA approved for NASAL specimens only), is one component of a comprehensive MRSA colonization surveillance program. It is not intended to diagnose MRSA infection nor to guide or monitor treatment for MRSA infections. Test performance is not FDA approved in patients less than 3 years old. Performed at [facility], [place] [address], [place], [place] [address]   Urine Culture     Status: None   Collection Time: 01/29/22  9:03 AM   Specimen: Urine, Catheterized  Result Value Ref Range Status   Specimen Description   Final    URINE, CATHETERIZED Performed at [place] [address], [place], [place] [address]    Special Requests   Final    NONE Performed at [facility], [place] [address] [place], [place] [address]    Culture   Final    NO GROWTH Performed at [facility], [place] [address] [place], [place] [address]    Report  Status 01/30/2022 FINAL  Final    Renal Function: Recent Labs    01/28/22 2132 01/29/22 0248 01/29/22 1229 01/30/22 0255 01/30/22 1246 01/30/22 1450 01/31/22 0247  CREATININE 0.97 0.93 0.87 0.90 0.90 0.81 0.92   Estimated Creatinine Clearance: 37.3 mL/min (by C-G formula based on SCr of 0.92 mg/dL).  Radiologic Imaging: CT ABDOMEN PELVIS W CONTRAST  Result Date: 01/31/2022 CLINICAL DATA:  Acute abdominal pain, rectal bleeding, abnormal EGD yesterday showing esophageal stenosis distally with possible mass EXAM: CT ABDOMEN AND PELVIS WITH CONTRAST TECHNIQUE: Multidetector CT imaging of the abdomen and pelvis was performed using the standard protocol following bolus administration of intravenous contrast. RADIATION DOSE REDUCTION: This exam was performed according to the departmental dose-optimization program which includes automated exposure control, adjustment of the mA and/or kV according to patient size and/or use of iterative reconstruction technique. CONTRAST:  148m OMNIPAQUE IOHEXOL 300 MG/ML SOLN IV. No oral contrast. COMPARISON:  07/14/2018 FINDINGS: Lower chest: Small bibasilar pleural effusions and associated atelectasis. Calcified granuloma LEFT lower lobe. Bronchiectasis RIGHT middle lobe. Hepatobiliary: 7 mm calcified gallstone within gallbladder. Liver normal appearance. No biliary dilatation. Pancreas: Normal appearance Spleen: Normal appearance Adrenals/Urinary Tract: Adrenal glands  normal appearance. Small RIGHT renal cyst 1.6 cm, simple features by CT; no follow-up imaging recommended. Fat attenuation nodule inferior pole RIGHT kidney 12 x 6 mm consistent with angiomyolipoma; no follow-up imaging recommended. LEFT hydronephrosis and proximal hydroureter without identified calculus. Tapered narrowing of the proximal LEFT ureter is seen with slight enhancement of the wall, could be inflammatory or stricture but mass not completely excluded. Remaining ureters unremarkable. Bladder  decompressed by Foley catheter. Stomach/Bowel: Post appendectomy. Questionable wall thickening of the stomach versus artifacts related underdistention. GE junction decompressed, poorly assessed. Large and small bowel loops unremarkable. Vascular/Lymphatic: Atherosclerotic calcifications aorta and iliac arteries without aneurysm. Vascular structures patent. No adenopathy Reproductive: Small calcified leiomyoma within uterus 12 mm diameter. Uterus and ovaries otherwise unremarkable. Pessary within vagina Other: Small LEFT inguinal hernia containing fat. No free air or free fluid. Edema within celiac axis and posterior to pancreas, nonspecific, may represent pancreatitis or could potentially be related to preceding endoscopy. Musculoskeletal: Osseous demineralization. Superior endplate compression fractures of T12 and L2, increased at L2. Degenerative disc disease changes lumbar spine. IMPRESSION: Questionable wall thickening of the stomach versus artifacts related to underdistention. Edema within celiac axis and posterior to pancreas, nonspecific, may represent pancreatitis or could potentially be related to preceding endoscopy. LEFT hydronephrosis and proximal hydroureter without identified calculus though the proximal ureter demonstrates mild wall enhancement and tapered narrowing, could represent stricture, inflammation, tumor not excluded; recommend urological consultation. Small bibasilar pleural effusions and associated atelectasis. Cholelithiasis. Small LEFT inguinal hernia containing fat. Superior endplate compression fractures of T12 and L2, increased at L2. Aortic Atherosclerosis (ICD10-I70.0). Electronically Signed   By: Lavonia Dana M.D.   On: 01/31/2022 11:55    I independently reviewed the above imaging studies.  Impression/Recommendation: ***  Dutch Gray 01/31/2022, 10:25 PM  Pryor Curia. MD   CC: ***

## 2022-01-31 NOTE — Evaluation (Signed)
Occupational Therapy Evaluation Patient Details Name: Latoya Curry MRN: [id number] DOB: Feb 11, 1931 Today's Date: 01/31/2022   History of Present Illness Patient is a pleasant 88 year old female history of hypokalemia, hypertension, diverticulosis, GERD, hypertension, dyslipidemia, PVCs presented to the ED with melanotic stools. Patient admitted with GI bleed.   Clinical Impression   Latoya Curry is an 88 year old woman who presents with generalized weakness, decreaed activity tolerance and impaired balance after three days of limited intake. At baseline she lives with her significant other and uses a rollator to ambulate most of the time. She is independent with ADLs with a sock aide. On evaluation she is near her baseline needing min guard to ambulate, min assist to stand and steady initially and setup to min assist for ADLs. Suspect she will recover quickly with ambulation and nutrition. Do not expect she will have OT needs at discharge. Will follow acutely.       Recommendations for follow up therapy are one component of a multi-disciplinary discharge planning process, led by the attending physician.  Recommendations may be updated based on patient status, additional functional criteria and insurance authorization.   Follow Up Recommendations  No OT follow up     Assistance Recommended at Discharge PRN  Patient can return home with the following A little help with bathing/dressing/bathroom;Assistance with cooking/housework    Functional Status Assessment  Patient has had a recent decline in their functional status and demonstrates the ability to make significant improvements in function in a reasonable and predictable amount of time.  Equipment Recommendations  None recommended by OT    Recommendations for Other Services       Precautions / Restrictions Precautions Precautions: Fall Restrictions Weight Bearing Restrictions: No      Mobility Bed Mobility Overal bed  mobility: Needs Assistance Bed Mobility: Supine to Sit     Supine to sit: Supervision          Transfers Overall transfer level: Needs assistance                 General transfer comment: Min assist to stand and steady. Did not have walker in room and used back of chair that was pushed as pseudo walker. No overt loss of balance and patietn steady with hand holds. Ambulated around bed to recliner.      Balance Overall balance assessment: Mild deficits observed, not formally tested                                         ADL either performed or assessed with clinical judgement   ADL Overall ADL's : Needs assistance/impaired Eating/Feeding: Independent   Grooming: Set up;Sitting   Upper Body Bathing: Set up;Sitting       Upper Body Dressing : Set up;Sitting   Lower Body Dressing: Minimal assistance;Sit to/from stand   Toilet Transfer: Rolling walker (2 wheels);Min TEFL teacher and Hygiene: Min guard;Sit to/from stand       Functional mobility during ADLs: Min guard;Rolling walker (2 wheels)       Vision Baseline Vision/History: 1 Wears glasses Ability to See in Adequate Light: 1 Impaired       Perception     Praxis      Pertinent Vitals/Pain Pain Assessment Pain Assessment: 0-10     Hand Dominance Right   Extremity/Trunk Assessment Upper Extremity Assessment Upper Extremity Assessment:  Overall [facility] for tasks assessed   Lower Extremity Assessment Lower Extremity Assessment: Defer to PT evaluation   Cervical / Trunk Assessment Cervical / Trunk Assessment: Kyphotic   Communication Communication Communication: HOH   Cognition Arousal/Alertness: Awake/alert Behavior During Therapy: WFL for tasks assessed/performed Overall Cognitive Status: Within Functional Limits for tasks assessed                                       General Comments       Exercises      Shoulder Instructions      Home Living Family/patient expects to be discharged to:: Private residence Living Arrangements: Spouse/significant other Available Help at Discharge: Family;Available 24 hours/day (significant other there but he has his own health issues) Type of Home: Apartment Home Access: Level entry     Home Layout: One level     Bathroom Shower/Tub: Occupational psychologist: Standard     Home Equipment: Rollator (4 wheels);Grab bars - tub/shower;Grab bars - toilet          Prior Functioning/Environment Prior Level of Function : Independent/Modified Independent                        OT Problem List: Decreased activity tolerance;Impaired balance (sitting and/or standing)      OT Treatment/Interventions: Self-care/ADL training;DME and/or AE instruction;Therapeutic activities;Balance training;Patient/family education    OT Goals(Current goals can be found in the care plan section) Acute Rehab OT Goals Patient Stated Goal: to go home at discharge OT Goal Formulation: With patient Time For Goal Achievement: 02/14/22 Potential to Achieve Goals: Good  OT Frequency: Min 2X/week    Co-evaluation              AM-PAC OT "6 Clicks" Daily Activity     Outcome Measure Help from another person eating meals?: None Help from another person taking care of personal grooming?: A Little Help from another person toileting, which includes using toliet, bedpan, or urinal?: A Little Help from another person bathing (including washing, rinsing, drying)?: A Little Help from another person to put on and taking off regular upper body clothing?: A Little Help from another person to put on and taking off regular lower body clothing?: A Little 6 Click Score: 19   End of Session Equipment Utilized During Treatment: Other (comment) Nurse Communication: Mobility status  Activity Tolerance: Patient tolerated treatment well Patient left: in chair;with call  bell/phone within reach;with chair alarm set;with family/visitor present  OT Visit Diagnosis: Muscle weakness (generalized) (M62.81)                Time: 3151-7616 OT Time Calculation (min): 22 min Charges:  OT General Charges $OT Visit: 1 Visit OT Evaluation $OT Eval Low Complexity: 1 Low  Gustavo Lah, OTR/L [place]  Office [phone]   Lenward Chancellor 01/31/2022, 2:28 PM

## 2022-01-31 NOTE — Progress Notes (Signed)
Spoke with daughter/HCPOA on phone and notified of [place] room change

## 2022-01-31 NOTE — Progress Notes (Signed)
[facility] Progress Note  Latoya Curry 88 y.o. [birth year]   Subjective: Feels ok. Denies abdominal pain. Daughter and nurse in room.  Objective: Vital signs: Vitals:   01/31/22 1000 01/31/22 1111  BP: 111/76   Pulse: 76   Resp: 19   Temp:  98.3 F (36.8 C)  SpO2: 96%     Physical Exam: Gen: lethargic, elderly, thin, no acute distress  HEENT: anicteric sclera CV: RRR Chest: CTA B Abd: soft, nontender, nondistended, +BS Ext: no edema  Lab Results: Recent Labs    01/29/22 0248 01/29/22 1229 01/30/22 0255 01/30/22 1246 01/30/22 1450 01/31/22 0247  NA 135   < > 137   < > 136 133*  K 2.8*   < > 2.7*   < > 3.7 3.7  CL 100   < > 104   < > 102 100  CO2 25   < > 25  --  26 27  GLUCOSE 86   < > 97   < > 102* 94  BUN 30*   < > 15   < > 11 10  CREATININE 0.93   < > 0.90   < > 0.81 0.92  CALCIUM 8.7*   < > 8.6*  --  8.4* 8.1*  MG 2.3  --  2.0  --   --   --   PHOS  --   --   --   --   --  2.6   < > = values in this interval not displayed.   Recent Labs    01/28/22 1159 01/31/22 0247  AST 26  --   ALT 12  --   ALKPHOS 37*  --   BILITOT 0.7  --   PROT 6.7  --   ALBUMIN 3.8 2.8*   Recent Labs    01/28/22 1159 01/28/22 1407 01/30/22 0255 01/30/22 1246 01/30/22 1450 01/31/22 0247  WBC 10.6*  --  9.3  --  10.4 9.4  NEUTROABS 8.4*  --  6.0  --   --   --   HGB 10.4*   < > 8.7*   < > 9.0* 8.6*  HCT 30.0*   < > 26.7*   < > 27.2* 26.5*  MCV 92.0  --  97.8  --  98.9 99.6  PLT 281  --  212  --  213 197   < > = values in this interval not displayed.      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 Path pending. CT today to evaluate for an extrinsic source. Continue Protonix 40 mg IV Q 12 hours. Resume clear liquid diet after CT. Supportive care. Dr. Randel Pigg to f/u from [facility] tomorrow.   [name] 01/31/2022, 11:29 AM  Questions please call [phone] ID: Latoya Curry,  female   DOB: [birth year], 88 y.o.   MRN: [id number]

## 2022-01-31 NOTE — Progress Notes (Signed)
PROGRESS NOTE    Latoya Curry  QAS:[id number] DOB: 1930-07-06 DOA: 01/28/2022 PCP: [name], NP    Chief Complaint  Patient presents with   Rectal Bleeding    Brief Narrative:  Patient is a pleasant 88 year old female history of hypokalemia, hypertension, diverticulosis, GERD, hypertension, dyslipidemia, PVCs presented to the ED with melanotic stools.  Patient denied any consistent NSAID use stated use Aleve occasionally for pain and aspirin 3 times weekly.  Patient called PCPs office and directed to the ED.  FOBT done was positive.  Hemoglobin noted at 9.5 on presentation with baseline hemoglobin 11.4 (01/08/2022.).  Patient also noted with urinary retention.  Patient admitted, serial CBCs obtained, GI consulted for further evaluation and management.   Assessment & Plan:   Principal Problem:   Acute GI bleeding Active Problems:   Gastritis determined by endoscopy   Gastric stenosis   Essential hypertension   Mixed dyslipidemia   Hypokalemia   Chronic low back pain   Hypercholesteremia   Hypertension   Graves' disease   Hyperthyroidism   Hypothyroidism (acquired)   Anxiety and depression   UGI bleed   Rectal bleed  1 probable upper GI bleed/melanotic stools/acute blood loss anemia/gastritis and gastric stenosis at pylorus.  EGD 01/30/2022 -Patient presented with episodes of melanotic stools, last melanotic stool 5 days ago. -Patient with history of diverticulosis. -Hemoglobin on presentation noted at 9.5 from 14.4 (01/08/2022) -Patient seen in consultation by GI and patient underwent EGD 01/30/2022 that was consistent with gastritis, biopsies done, gastric stenosis noted at the pylorus.   -CT abdomen and pelvis ordered per GI to rule out extrinsic causes for gastric stenosis.   -Hemoglobin currently stable at 8.6.   -Was on the Protonix drip and has been transition to IV PPI every 12 hours.  -Continue serial CBCs. -Transfuse transfusion threshold Hgb < 7. -GI  following and appreciate their input and recommendations.  2.  Hypokalemia -Potassium noted at 2.1 on day of admission, -Status post IV potassium and KCl and IV fluids. -Repleted currently at 3.7. -Magnesium at 2.0. -Follow.  3.  Hypothyroidism -Continue home regimen Synthroid.  4..  Urinary retention -Status post Foley catheter placement this admission. -Urinalysis with concerns for possible UTI. -Flomax discontinued.   -Continue empiric IV antibiotics.   -Voiding trial in the next 1 to 3 days.  5.  Depression/anxiety -Celexa.   6.  Hypertension -Blood pressure noted borderline likely secondary to acute GI bleed early on in the hospitalization.. -BP improved with hydration.   -Continue to hold antihypertensive medications.   -Flomax discontinued.   -Gentle hydration.   7.  Hyperlipidemia -Crestor.  8.  Probable UTI -Patient noted to urinary retention on admission. -Urinalysis with moderate leukocytes, nitrite negative, many bacteria, 11-20 WBCs. -Urine cultures with no growth to date.   -Treat empirically with IV Rocephin day 2/3.     DVT prophylaxis: SCDs Code Status: DNR Family Communication: Updated patient, daughter at the bedside. Disposition: Transfer to progressive care floor.  Likely home when clinically improved and cleared by GI.Marland Kitchen  Status is: Inpatient Remains inpatient appropriate because: Severity of illness   Consultants:  Gastroenterology: Dr. Carlean Purl 01/29/2022  Procedures:  Upper endoscopy per Dr. Michail Sermon 01/30/2022 CT abdomen and pelvis pending  Antimicrobials:  IV Rocephin 01/30/2022>>>> 02/01/2022   Subjective: Laying in bed.  No bowel movement since admission.  No chest pain.  No shortness of breath.  Asking when CT scan will be done.  Wants to eat.  Daughter at bedside.  Objective: Vitals:   01/31/22 0749 01/31/22 0800 01/31/22 0940 01/31/22 1000  BP:  (!) 127/55  111/76  Pulse:  68  76  Resp:  17  19  Temp: 98 F (36.7 C)      TempSrc: Oral     SpO2:  95%  96%  Weight:   67.2 kg   Height:        Intake/Output Summary (Last 24 hours) at 01/31/2022 1057 Last data filed at 01/31/2022 0700 Gross per 24 hour  Intake 1586 ml  Output 1010 ml  Net 576 ml   Filed Weights   01/28/22 1120 01/31/22 0940  Weight: 65.8 kg 67.2 kg    Examination:  General exam: NAD.  Improving dry mucous membranes. Respiratory system: Lungs clear to auscultation bilaterally.  No wheezes, no crackles, no rhonchi.  Fair air movement.  Speaking in full sentences.   Cardiovascular system: RRR no murmurs rubs or gallops.  No JVD.  No lower extremity edema.  Gastrointestinal system: Abdomen is soft, nontender, nondistended, positive bowel sounds.  No rebound.  No guarding.  Central nervous system: Alert and oriented. No focal neurological deficits. Extremities: Symmetric 5 x 5 power. Skin: No rashes, lesions or ulcers Psychiatry: Judgement and insight appear normal. Mood & affect appropriate.     Data Reviewed: I have personally reviewed following labs and imaging studies  CBC: Recent Labs  Lab 01/28/22 1159 01/28/22 1407 01/29/22 1846 01/30/22 0255 01/30/22 1246 01/30/22 1450 01/31/22 0247  WBC 10.6*  --   --  9.3  --  10.4 9.4  NEUTROABS 8.4*  --   --  6.0  --   --   --   HGB 10.4*   < > 8.7* 8.7* 9.9* 9.0* 8.6*  HCT 30.0*   < > 26.5* 26.7* 29.0* 27.2* 26.5*  MCV 92.0  --   --  97.8  --  98.9 99.6  PLT 281  --   --  212  --  213 197   < > = values in this interval not displayed.    Basic Metabolic Panel: Recent Labs  Lab 01/28/22 1407 01/28/22 2132 01/29/22 0248 01/29/22 1229 01/30/22 0255 01/30/22 1246 01/30/22 1450 01/31/22 0247  NA  --  133* 135 134* 137 135 136 133*  K  --  2.8* 2.8* 3.7 2.7* 4.0 3.7 3.7  CL  --  100 100 104 104 99 102 100  CO2  --  '22 25 23 25  '$ --  26 27  GLUCOSE  --  96 86 127* 97 98 102* 94  BUN  --  30* 30* '21 15 10 11 10  '$ CREATININE  --  0.97 0.93 0.87 0.90 0.90 0.81 0.92   CALCIUM  --  8.7* 8.7* 8.3* 8.6*  --  8.4* 8.1*  MG 1.8 1.8 2.3  --  2.0  --   --   --   PHOS  --   --   --   --   --   --   --  2.6    GFR: Estimated Creatinine Clearance: 37.3 mL/min (by C-G formula based on SCr of 0.92 mg/dL).  Liver Function Tests: Recent Labs  Lab 01/28/22 1159 01/31/22 0247  AST 26  --   ALT 12  --   ALKPHOS 37*  --   BILITOT 0.7  --   PROT 6.7  --   ALBUMIN 3.8 2.8*    CBG: No results for input(s): "GLUCAP" in the last 168 hours.  Recent Results (from the past 240 hour(s))  MRSA Next Gen by PCR, Nasal     Status: Abnormal   Collection Time: 01/28/22  6:42 PM   Specimen: Nasal Mucosa; Nasal Swab  Result Value Ref Range Status   MRSA by PCR Next Gen DETECTED (A) NOT DETECTED Final    Comment: (NOTE) The GeneXpert MRSA Assay (FDA approved for NASAL specimens only), is one component of a comprehensive MRSA colonization surveillance program. It is not intended to diagnose MRSA infection nor to guide or monitor treatment for MRSA infections. Test performance is not FDA approved in patients less than 80 years old. Performed at [facility], [place] [address], [place], [place] [address]   Urine Culture     Status: None   Collection Time: 01/29/22  9:03 AM   Specimen: Urine, Catheterized  Result Value Ref Range Status   Specimen Description   Final    URINE, CATHETERIZED Performed at [place] [address], [place], [place] [address]    Special Requests   Final    NONE Performed at [facility], [place] [address], [place], [place] [address]    Culture   Final    NO GROWTH Performed at [facility], [place] [address] [place], [place] [address]    Report Status 01/30/2022 FINAL  Final         Radiology Studies: No results found.      Scheduled Meds:  acetaminophen  500 mg Oral TID   Chlorhexidine Gluconate Cloth  6 each Topical Daily   citalopram  20 mg Oral Daily    levothyroxine  50 mcg Oral Once per day on Sun Tue Wed Fri Sat   And   levothyroxine  75 mcg Oral Once per day on Mon Thu   mupirocin ointment  1 Application Nasal BID   pantoprazole (PROTONIX) IV  40 mg Intravenous Q12H   rosuvastatin  5 mg Oral Q M,W,F   sodium chloride (PF)       Continuous Infusions:  sodium chloride Stopped (01/30/22 1156)   sodium chloride     cefTRIAXone (ROCEPHIN)  IV Stopped (01/31/22 0818)     LOS: 3 days    Time spent: 40 minutes    Irine Seal, MD [facility]   To contact the attending provider between 7A-7P or the covering provider during after hours 7P-7A, please log into the web site [email or web address] and access using universal [place] password for that web site. If you do not have the password, please call the hospital operator.  01/31/2022, 10:57 AM

## 2022-01-31 NOTE — Progress Notes (Signed)
Did not d/c foley today and attempt urine trial due to CT imaging results of hydronephrosis and stricture in left ureter. Waiting for determination of referral to urology during inpatient setting per radiologies recommendation.

## 2022-02-01 DIAGNOSIS — K922 Gastrointestinal hemorrhage, unspecified: Secondary | ICD-10-CM | POA: Diagnosis not present

## 2022-02-01 DIAGNOSIS — E876 Hypokalemia: Secondary | ICD-10-CM | POA: Diagnosis not present

## 2022-02-01 DIAGNOSIS — F419 Anxiety disorder, unspecified: Secondary | ICD-10-CM | POA: Diagnosis not present

## 2022-02-01 DIAGNOSIS — K625 Hemorrhage of anus and rectum: Secondary | ICD-10-CM | POA: Diagnosis not present

## 2022-02-01 LAB — BASIC METABOLIC PANEL
Anion gap: 6 (ref 5–15)
BUN: 9 mg/dL (ref 8–23)
CO2: 28 mmol/L (ref 22–32)
Calcium: 8.8 mg/dL — ABNORMAL LOW (ref 8.9–10.3)
Chloride: 102 mmol/L (ref 98–111)
Creatinine, Ser: 0.86 mg/dL (ref 0.44–1.00)
GFR, Estimated: >60 mL/min (ref >60)
Glucose, Bld: 109 mg/dL — ABNORMAL HIGH (ref 70–99)
Potassium: 4 mmol/L (ref 3.5–5.1)
Sodium: 136 mmol/L (ref 135–145)

## 2022-02-01 LAB — CBC
HCT: 28.3 % — ABNORMAL LOW (ref 36.0–46.0)
Hemoglobin: 9.1 g/dL — ABNORMAL LOW (ref 12.0–15.0)
MCH: 31.9 pg (ref 26.0–34.0)
MCHC: 32.2 g/dL (ref 30.0–36.0)
MCV: 99.3 fL (ref 80.0–100.0)
Platelets: 228 10*3/uL (ref 150–400)
RBC: 2.85 MIL/uL — ABNORMAL LOW (ref 3.87–5.11)
RDW: 14.8 % (ref 11.5–15.5)
WBC: 10.9 10*3/uL — ABNORMAL HIGH (ref 4.0–10.5)
nRBC: 0 % (ref 0.0–0.2)

## 2022-02-01 LAB — MAGNESIUM: Magnesium: 1.7 mg/dL (ref 1.7–2.4)

## 2022-02-01 LAB — LIPASE, BLOOD: Lipase: 50 U/L (ref 11–51)

## 2022-02-01 MED ORDER — MAGNESIUM SULFATE 2 GM/50ML IV SOLN
2.0000 g | Freq: Once | INTRAVENOUS | Status: AC
  Administered 2022-02-01: 2 g via INTRAVENOUS
  Filled 2022-02-01: qty 50

## 2022-02-01 NOTE — NC FL2 (Signed)
[place] LEVEL OF CARE FORM     IDENTIFICATION  Patient Name: Latoya Curry Birthdate: August 02, 1930 Sex: female Admission Date (Current Location): 01/28/2022  [facility] [place] Number:  Herbalist and Address:  [facility],  [place], [place]      Provider Number: [id number]  Attending Physician Name and Address:  Eugenie Filler, MD  Relative Name and Phone Number:       Current Level of Care: Hospital Recommended Level of Care: [place] Prior Approval Number:    Date Approved/Denied:   PASRR Number: [id number]  Discharge Plan: SNF    Current Diagnoses: Patient Active Problem List   Diagnosis Date Noted   Gastritis determined by endoscopy 01/31/2022   Gastric stenosis 01/31/2022   Acute GI bleeding 01/29/2022   UGI bleed 01/28/2022   Rectal bleed 01/28/2022   Hypothyroidism (acquired) 01/08/2022   Anxiety and depression 01/08/2022   Adjustment disorder 05/07/2020   Encounter for general adult medical examination without abnormal findings 05/07/2020   Appendicitis 05/07/2020   Benign paroxysmal positional vertigo 05/07/2020   Cardiac arrhythmia 05/07/2020   Conjunctivitis of left eye 05/07/2020   Constipation 05/07/2020   Diarrhea 05/07/2020   Diaphoresis 05/07/2020   Diverticular disease of colon 05/07/2020   Diverticulitis of colon 05/07/2020   Fever 05/07/2020   Gastro-esophageal reflux disease without esophagitis 05/07/2020   Graves' disease 05/07/2020   Hearing loss 05/07/2020   History of diverticulitis 05/07/2020   Insomnia 05/07/2020   Left lower quadrant pain 05/07/2020   Loss of appetite 05/07/2020   Epistaxis 05/07/2020   Nasal crusting 05/07/2020   Neck pain 05/07/2020   Noninflammatory disorder of vagina 05/07/2020   Other long term (current) drug therapy 05/07/2020   Pain in right hand 05/07/2020   Postnasal drip 05/07/2020   Problem related to social  environment 05/07/2020   Rash 05/07/2020   Sciatica 05/07/2020   Skin sensation disturbance 05/07/2020   Spasm 05/07/2020   Hyperthyroidism 05/07/2020   Thyrotoxicosis 05/07/2020   Incontinence without sensory awareness 05/07/2020   Urinary tract infectious disease 05/07/2020   Viral syndrome 05/07/2020   Vitamin D deficiency 05/07/2020   Weight loss 05/07/2020   Thoracic aortic aneurysm without rupture ([place]) 05/07/2020   Chest pain 05/07/2020   Right hip pain 05/07/2020   Low back pain 05/07/2020   Pure hypercholesterolemia 05/07/2020   Acute bronchitis 05/07/2020   Allergic bronchitis 05/07/2020   Allergic rhinitis 05/07/2020   Bronchitis 05/07/2020   Eczema 05/07/2020   Sleep disorder 05/07/2020   Chronic low back pain    Diverticulitis    DJD (degenerative joint disease) of knee    Hypercholesteremia    Hypertension    Neurodermatitis    Obesity    Osteopenia    Recurrent UTI    Thoracic aortic aneurysm (HCC)    Transient global amnesia    Thoracic aortic atherosclerosis ([place]) 01/11/2020   Aortic regurgitation 05/10/2019   Pain in left hip 03/16/2019   Hypokalemia 06/24/2018   Small bowel obstruction, partial, s/p lap adhesiolysis 06/24/2018 06/24/2018   Incarcerated incisional hernia s/p primary repair 06/24/2018 06/24/2018   Acute gangrenous appendicitis s/p lap appendectomy 06/24/2018 06/23/2018   Aortic root enlargement ([place]) 08/19/2017   Ascending aorta dilation ([place]) 08/19/2017   Essential hypertension 08/05/2017   Mixed dyslipidemia 08/05/2017   PVC (premature ventricular contraction) 08/05/2017   Chest discomfort 08/05/2017   Cough 09/23/2011    Orientation RESPIRATION BLADDER Height &  Weight     Self, Time, Situation, Place  Normal Continent Weight: 67.7 kg Height:  '5\' 5"'$  (165.1 cm)  BEHAVIORAL SYMPTOMS/MOOD NEUROLOGICAL BOWEL NUTRITION STATUS      Continent Diet (regular)  AMBULATORY STATUS COMMUNICATION OF NEEDS Skin   Extensive Assist Verbally  Normal                       Personal Care Assistance Level of Assistance  Bathing, Feeding, Dressing Bathing Assistance: Limited assistance Feeding assistance: Limited assistance Dressing Assistance: Limited assistance     Functional Limitations Info  Sight, Hearing, Speech Sight Info: Adequate Hearing Info: Adequate Speech Info: Adequate    SPECIAL CARE FACTORS FREQUENCY  PT (By licensed PT), OT (By licensed OT)     PT Frequency: 5 x weekly OT Frequency: 5 x weekly            Contractures Contractures Info: Not present    Additional Factors Info  Code Status Code Status Info: DNR             Current Medications (02/01/2022):  This is the current hospital active medication list Current Facility-Administered Medications  Medication Dose Route Frequency Provider Last Rate Last Admin   0.9 %  sodium chloride infusion   Intravenous PRN Wilford Corner, MD   Stopped at 01/30/22 1156   acetaminophen (TYLENOL) tablet 650 mg  650 mg Oral Q6H PRN Wilford Corner, MD   650 mg at 01/31/22 0146   Or   acetaminophen (TYLENOL) suppository 650 mg  650 mg Rectal Q6H PRN Wilford Corner, MD       acetaminophen (TYLENOL) tablet 500 mg  500 mg Oral TID Wilford Corner, MD   500 mg at 02/01/22 0845   Chlorhexidine Gluconate Cloth 2 % PADS 6 each  6 each Topical Daily Wilford Corner, MD   6 each at 02/01/22 0852   citalopram (CELEXA) tablet 20 mg  20 mg Oral Daily Wilford Corner, MD   20 mg at 02/01/22 0845   levothyroxine (SYNTHROID) tablet 50 mcg  50 mcg Oral Once per day on Sun Tue Wed Fri Sat Wilford Corner, MD   50 mcg at 02/01/22 4081   And   levothyroxine (SYNTHROID) tablet 75 mcg  75 mcg Oral Once per day on Mon Thu Schooler, Vincent, MD   75 mcg at 01/30/22 4481   magnesium sulfate IVPB 2 g 50 mL  2 g Intravenous Once Eugenie Filler, MD 50 mL/hr at 02/01/22 1007 2 g at 02/01/22 1007   melatonin tablet 3 mg  3 mg Oral QHS PRN Wilford Corner, MD   3  mg at 01/31/22 2349   mupirocin ointment (BACTROBAN) 2 % 1 Application  1 Application Nasal BID Wilford Corner, MD   1 Application at 85/63/14 0853   ondansetron (ZOFRAN) tablet 4 mg  4 mg Oral Q6H PRN Wilford Corner, MD       Or   ondansetron [facility]) injection 4 mg  4 mg Intravenous Q6H PRN Wilford Corner, MD       Oral care mouth rinse  15 mL Mouth Rinse PRN Wilford Corner, MD       pantoprazole (PROTONIX) injection 40 mg  40 mg Intravenous Q12H Wilford Corner, MD   40 mg at 02/01/22 0845   rosuvastatin (CRESTOR) tablet 5 mg  5 mg Oral Q M,W,F Wilford Corner, MD   5 mg at 01/31/22 0936   traMADol (ULTRAM) tablet 50 mg  50 mg Oral Q12H  PRN Eugenie Filler, MD   50 mg at 02/01/22 0046     Discharge Medications: Please see discharge summary for a list of discharge medications.  Relevant Imaging Results:  Relevant Lab Results:   Additional Information ssn: [id number]  Leeroy Cha, RN

## 2022-02-01 NOTE — Progress Notes (Signed)
[facility] Progress Note  SUBJECTIVE:   Interval history: Latoya Curry was seen and evaluated today at bedside.  She was resting comfortably in bed.  She denied chest pain or shortness of breath.  She denied nausea or vomiting.  She has not had a bowel movement.  She is tolerating a clear liquid diet.  Hemoglobin relatively stable at 9.1 today from 9.3/8.6 yesterday.  CT scan of abdomen pelvis completed on 12/8 showed questionable wall thickening of stomach, GE junction decompressed, small/large bowel loops unremarkable, edema within celiac plexus and posterior to pancreas.  Lipase completed today normal.  Past Medical History:  Diagnosis Date   Acute bronchitis 05/07/2020   Acute gangrenous appendicitis s/p lap appendectomy 06/24/2018 06/23/2018   Adjustment disorder 05/07/2020   Allergic bronchitis 05/07/2020   Allergic rhinitis 05/07/2020   Aortic regurgitation 05/10/2019   Aortic root enlargement ([place]) 08/19/2017   Appendicitis 05/07/2020   Ascending aorta dilation ([facility]) 08/19/2017   Benign paroxysmal positional vertigo 05/07/2020   Bronchitis 05/07/2020   Cardiac arrhythmia 05/07/2020   Chest discomfort 08/05/2017   Chest pain 05/07/2020   Chronic low back pain    Conjunctivitis of left eye 05/07/2020   Constipation 05/07/2020   Cough 09/23/2011   Followed in Pulmonary clinic/ [facility]/ [place]   - Sinus CT 09/25/2011 > neg    Diaphoresis 05/07/2020   Diarrhea 05/07/2020   Diverticular disease of colon 05/07/2020   Diverticulitis    Diverticulitis of colon 05/07/2020   DJD (degenerative joint disease) of knee    Eczema 05/07/2020   Encounter for general adult medical examination without abnormal findings 05/07/2020   Epistaxis 05/07/2020   Essential hypertension 08/05/2017   Fever 05/07/2020   Gastro-esophageal reflux disease without esophagitis 05/07/2020   Graves' disease 05/07/2020   Hearing loss 05/07/2020   History of diverticulitis 05/07/2020   Hypercholesteremia     Hypertension    Hyperthyroidism 05/07/2020   Hypokalemia 06/24/2018   Incarcerated incisional hernia s/p primary repair 06/24/2018 06/24/2018   Incontinence without sensory awareness 05/07/2020   Insomnia 05/07/2020   Left lower quadrant pain 05/07/2020   Loss of appetite 05/07/2020   Low back pain 05/07/2020   Mixed dyslipidemia 08/05/2017   Nasal crusting 05/07/2020   Neck pain 05/07/2020   Neurodermatitis    Noninflammatory disorder of vagina 05/07/2020   Obesity    Osteopenia    Other long term (current) drug therapy 05/07/2020   Pain in left hip 03/16/2019   Pain in right hand 05/07/2020   Postnasal drip 05/07/2020   Problem related to social environment 05/07/2020   Pure hypercholesterolemia 05/07/2020   PVC (premature ventricular contraction) 08/05/2017   Rash 05/07/2020   Recurrent UTI    Right hip pain 05/07/2020   Sciatica 05/07/2020   Skin sensation disturbance 05/07/2020   Sleep disorder 05/07/2020   Small bowel obstruction, partial, s/p lap adhesiolysis 06/24/2018 06/24/2018   Spasm 05/07/2020   Thoracic aortic aneurysm [facility])    Thoracic aortic aneurysm without rupture ([place]) 05/07/2020   Thoracic aortic atherosclerosis ([place]) 01/11/2020   Thyrotoxicosis 05/07/2020   Transient global amnesia    Urinary tract infectious disease 05/07/2020   Viral syndrome 05/07/2020   Vitamin D deficiency 05/07/2020   Weight loss 05/07/2020   Past Surgical History:  Procedure Laterality Date   APPENDECTOMY     COLON RESECTION SIGMOID  2004   Dr Dalbert Batman   EXPLORATORY LAPAROTOMY  2004   r/o pancreas mass.  Dr Dalbert Batman   IR RADIOLOGIST EVAL &  MGMT  07/14/2018   LAPAROSCOPIC APPENDECTOMY N/A 06/24/2018   Procedure: ACUTE GANGRENOUS APPENDICITIS WITH ABSCESS, PARTIAL SMALL BOWEL OBSTRUCTION, INCISIONAL HERNIA REPAIR, LYSIS OF ADHESIONS;  Surgeon: Michael Boston, MD;  Location: [facility];  Service: General;  Laterality: N/A;   TOTAL KNEE ARTHROPLASTY     bilaterallly   Current Facility-Administered Medications   Medication Dose Route Frequency Provider Last Rate Last Admin   0.9 %  sodium chloride infusion   Intravenous PRN Wilford Corner, MD   Stopped at 01/30/22 1156   acetaminophen (TYLENOL) tablet 650 mg  650 mg Oral Q6H PRN Wilford Corner, MD   650 mg at 01/31/22 0146   Or   acetaminophen (TYLENOL) suppository 650 mg  650 mg Rectal Q6H PRN Wilford Corner, MD       acetaminophen (TYLENOL) tablet 500 mg  500 mg Oral TID Wilford Corner, MD   500 mg at 02/01/22 0845   Chlorhexidine Gluconate Cloth 2 % PADS 6 each  6 each Topical Daily Wilford Corner, MD   6 each at 02/01/22 0852   citalopram (CELEXA) tablet 20 mg  20 mg Oral Daily Wilford Corner, MD   20 mg at 02/01/22 0845   levothyroxine (SYNTHROID) tablet 50 mcg  50 mcg Oral Once per day on Sun Tue Wed Fri Sat Wilford Corner, MD   50 mcg at 02/01/22 4401   And   levothyroxine (SYNTHROID) tablet 75 mcg  75 mcg Oral Once per day on Mon Thu Schooler, Vincent, MD   75 mcg at 01/30/22 0272   melatonin tablet 3 mg  3 mg Oral QHS PRN Wilford Corner, MD   3 mg at 01/31/22 2349   mupirocin ointment (BACTROBAN) 2 % 1 Application  1 Application Nasal BID Wilford Corner, MD   1 Application at 53/66/44 0853   ondansetron (ZOFRAN) tablet 4 mg  4 mg Oral Q6H PRN Wilford Corner, MD       Or   ondansetron (ZOFRAN) injection 4 mg  4 mg Intravenous Q6H PRN Wilford Corner, MD       Oral care mouth rinse  15 mL Mouth Rinse PRN Wilford Corner, MD       pantoprazole (PROTONIX) injection 40 mg  40 mg Intravenous Q12H Wilford Corner, MD   40 mg at 02/01/22 0845   rosuvastatin (CRESTOR) tablet 5 mg  5 mg Oral Q M,W,F Wilford Corner, MD   5 mg at 01/31/22 0936   traMADol (ULTRAM) tablet 50 mg  50 mg Oral Q12H PRN Eugenie Filler, MD   50 mg at 02/01/22 1218   Allergies as of 01/28/2022 - Review Complete 01/28/2022  Allergen Reaction Noted   Levofloxacin Other (See Comments) 04/18/2020   Codeine Nausea And Vomiting 06/12/2016    Simvastatin Other (See Comments) 06/12/2016   Sulfamethoxazole-trimethoprim Other (See Comments) 06/12/2016   Zestril [lisinopril] Cough 06/12/2016   Review of Systems:  Review of Systems  Respiratory:  Negative for shortness of breath.   Cardiovascular:  Negative for chest pain.  Gastrointestinal:  Negative for abdominal pain, nausea and vomiting.    OBJECTIVE:   Temp:  [98.1 F (36.7 C)-98.4 F (36.9 C)] 98.1 F (36.7 C) (12/09 1314) Pulse Rate:  [75-105] 84 (12/09 1314) Resp:  [19-28] 23 (12/09 1314) BP: (100-139)/(62-78) 120/66 (12/09 1314) SpO2:  [92 %-98 %] 94 % (12/09 1314) Weight:  [67.7 kg] 67.7 kg (12/09 0442) Last BM Date : 01/29/22 Physical Exam Constitutional:      General: She is not in acute  distress.    Appearance: She is not ill-appearing, toxic-appearing or diaphoretic.  Cardiovascular:     Rate and Rhythm: Normal rate and regular rhythm.  Pulmonary:     Effort: No respiratory distress.     Breath sounds: Normal breath sounds.  Abdominal:     General: Bowel sounds are normal. There is no distension.     Palpations: Abdomen is soft.     Tenderness: There is no abdominal tenderness. There is no guarding.  Neurological:     Mental Status: She is alert.     Labs: Recent Labs    01/30/22 1450 01/31/22 0247 01/31/22 1404 02/01/22 0402  WBC 10.4 9.4  --  10.9*  HGB 9.0* 8.6* 9.3* 9.1*  HCT 27.2* 26.5* 28.6* 28.3*  PLT 213 197  --  228   BMET Recent Labs    01/30/22 1450 01/31/22 0247 02/01/22 0402  NA 136 133* 136  K 3.7 3.7 4.0  CL 102 100 102  CO2 '26 27 28  '$ GLUCOSE 102* 94 109*  BUN '11 10 9  '$ CREATININE 0.81 0.92 0.86  CALCIUM 8.4* 8.1* 8.8*   LFT Recent Labs    01/31/22 0247  ALBUMIN 2.8*   PT/INR No results for input(s): "LABPROT", "INR" in the last 72 hours. Diagnostic imaging: CT ABDOMEN PELVIS W CONTRAST  Result Date: 01/31/2022 CLINICAL DATA:  Acute abdominal pain, rectal bleeding, abnormal EGD yesterday showing  esophageal stenosis distally with possible mass EXAM: CT ABDOMEN AND PELVIS WITH CONTRAST TECHNIQUE: Multidetector CT imaging of the abdomen and pelvis was performed using the standard protocol following bolus administration of intravenous contrast. RADIATION DOSE REDUCTION: This exam was performed according to the departmental dose-optimization program which includes automated exposure control, adjustment of the mA and/or kV according to patient size and/or use of iterative reconstruction technique. CONTRAST:  11m OMNIPAQUE IOHEXOL 300 MG/ML SOLN IV. No oral contrast. COMPARISON:  07/14/2018 FINDINGS: Lower chest: Small bibasilar pleural effusions and associated atelectasis. Calcified granuloma LEFT lower lobe. Bronchiectasis RIGHT middle lobe. Hepatobiliary: 7 mm calcified gallstone within gallbladder. Liver normal appearance. No biliary dilatation. Pancreas: Normal appearance Spleen: Normal appearance Adrenals/Urinary Tract: Adrenal glands normal appearance. Small RIGHT renal cyst 1.6 cm, simple features by CT; no follow-up imaging recommended. Fat attenuation nodule inferior pole RIGHT kidney 12 x 6 mm consistent with angiomyolipoma; no follow-up imaging recommended. LEFT hydronephrosis and proximal hydroureter without identified calculus. Tapered narrowing of the proximal LEFT ureter is seen with slight enhancement of the wall, could be inflammatory or stricture but mass not completely excluded. Remaining ureters unremarkable. Bladder decompressed by Foley catheter. Stomach/Bowel: Post appendectomy. Questionable wall thickening of the stomach versus artifacts related underdistention. GE junction decompressed, poorly assessed. Large and small bowel loops unremarkable. Vascular/Lymphatic: Atherosclerotic calcifications aorta and iliac arteries without aneurysm. Vascular structures patent. No adenopathy Reproductive: Small calcified leiomyoma within uterus 12 mm diameter. Uterus and ovaries otherwise  unremarkable. Pessary within vagina Other: Small LEFT inguinal hernia containing fat. No free air or free fluid. Edema within celiac axis and posterior to pancreas, nonspecific, may represent pancreatitis or could potentially be related to preceding endoscopy. Musculoskeletal: Osseous demineralization. Superior endplate compression fractures of T12 and L2, increased at L2. Degenerative disc disease changes lumbar spine. IMPRESSION: Questionable wall thickening of the stomach versus artifacts related to underdistention. Edema within celiac axis and posterior to pancreas, nonspecific, may represent pancreatitis or could potentially be related to preceding endoscopy. LEFT hydronephrosis and proximal hydroureter without identified calculus though the proximal ureter demonstrates mild wall  enhancement and tapered narrowing, could represent stricture, inflammation, tumor not excluded; recommend urological consultation. Small bibasilar pleural effusions and associated atelectasis. Cholelithiasis. Small LEFT inguinal hernia containing fat. Superior endplate compression fractures of T12 and L2, increased at L2. Aortic Atherosclerosis (ICD10-I70.0). Electronically Signed   By: Lavonia Dana M.D.   On: 01/31/2022 11:55    IMPRESSION: Melena Normocytic anemia, stable Intrinsic severe stenosis of pylorus  -Seen on EGD 01/30/22 Gastric inflammation, linear erosions in antrum  -Seen on EGD 01/30/2022  -Pathology pending Abnormal CT imaging History hypertension History diverticulosis  PLAN: -Patient tolerating liquid diet well, will advance to low residue/low fiber in setting of pyloric stenosis -Continue PPI therapy -Follow-up pathology from EGD 01/30/2022 -Trend H/H, transfuse for hemoglobin less than 7 -Monitor bowel movements -[facility] will follow   LOS: 4 days   Danton Clap, [facility]

## 2022-02-01 NOTE — Progress Notes (Signed)
PROGRESS NOTE    Latoya Curry  TJQ:[id number] DOB: 09/08/1930 DOA: 01/28/2022 PCP: [name], NP    Chief Complaint  Patient presents with   Rectal Bleeding    Brief Narrative:  Patient is a pleasant 88 year old female history of hypokalemia, hypertension, diverticulosis, GERD, hypertension, dyslipidemia, PVCs presented to the ED with melanotic stools.  Patient denied any consistent NSAID use stated use Aleve occasionally for pain and aspirin 3 times weekly.  Patient called PCPs office and directed to the ED.  FOBT done was positive.  Hemoglobin noted at 9.5 on presentation with baseline hemoglobin 11.4 (01/08/2022.).  Patient also noted with urinary retention.  Patient admitted, serial CBCs obtained, GI consulted for further evaluation and management.   Assessment & Plan:   Principal Problem:   Acute GI bleeding Active Problems:   Gastritis determined by endoscopy   Gastric stenosis   Essential hypertension   Mixed dyslipidemia   Hypokalemia   Chronic low back pain   Hypercholesteremia   Hypertension   Graves' disease   Hyperthyroidism   Hypothyroidism (acquired)   Anxiety and depression   UGI bleed   Rectal bleed  1 probable upper GI bleed/melanotic stools/acute blood loss anemia/gastritis and gastric stenosis at pylorus.  EGD 01/30/2022 -Patient presented with episodes of melanotic stools, last melanotic stool 5 days ago. -Patient with history of diverticulosis. -Hemoglobin on presentation noted at 9.5 from 14.4 (01/08/2022) -Patient seen in consultation by GI and patient underwent EGD 01/30/2022 that was consistent with gastritis, biopsies done, gastric stenosis noted at the pylorus.   -CT abdomen and pelvis ordered per GI which was done 01/31/2022 which was negative for any extrinsic causes for gastric stenosis.  -Hemoglobin currently stable at 9.1 this morning. -Was on the Protonix drip and has been transition to IV PPI every 12 hours.  -Continue serial  CBCs. -Noted to have tolerated full liquid diet yesterday, diet being advanced per GI to low residue/low fiber diet in the setting of pyloric stenosis. -Transfuse transfusion threshold Hgb < 7. -GI following and appreciate their input and recommendations.  2.  Hypokalemia -Potassium noted at 2.1 on day of admission, -Status post IV potassium and KCl and IV fluids. -Repleted currently at 4 -Magnesium at 1.7. -Magnesium sulfate 2 g IV x 1. -Follow.  3.  Hypothyroidism -Synthroid.   4..  Urinary retention -Status post Foley catheter placement this admission. -Urinalysis with concerns for possible UTI. -Flomax discontinued.   -CT abdomen and pelvis concerning for left hydroureteronephrosis.  See below. -Continue IV antibiotics of Rocephin for possible UTI/bacteriuria and completed 3-day course of antibiotic treatment. -Patient seen in consultation by urology and are in agreement to a voiding trial to be done today. -Check PVR. -Outpatient follow-up with urology.  5.  Depression/anxiety -Continue Celexa.   6.  Hypertension -Blood pressure noted borderline likely secondary to acute GI bleed early on in the hospitalization.. -BP improved with hydration.   -Continue to hold antihypertensive medications.   -Flomax discontinued.   -Saline lock IV fluids.    7.  Hyperlipidemia -Continue Crestor.  8.  Probable UTI versus bacteriuria. -Patient noted to urinary retention on admission. -Urinalysis with moderate leukocytes, nitrite negative, many bacteria, 11-20 WBCs. -Urine cultures with no growth to date.   -Treat empirically with IV Rocephin day 3/3.  9.  Left hydrouteronephrosis -Noted incidentally on CT abdomen and pelvis done 01/31/2022. -Patient noted to have also had urinary retention on admission Foley catheter replaced. -Urology consulted who assessed patient and felt okay  to do voiding trial today, renal function stable, recommending outpatient follow-up with patient's  primary urologist Dr. Diona Fanti for further diagnostic evaluation and recommendations.    DVT prophylaxis: SCDs Code Status: DNR Family Communication: Updated patient, no family at bedside.  Disposition: SNF once cleared by GI bed available hopefully in the next 2 to 3 days.   Status is: Inpatient Remains inpatient appropriate because: Severity of illness   Consultants:  Gastroenterology: Dr. Carlean Purl 01/29/2022 Urology: Dr. Alinda Money 01/31/2022  Procedures:  Upper endoscopy per Dr. Michail Sermon 01/30/2022 CT abdomen and pelvis 01/31/2022  Antimicrobials:  IV Rocephin 01/30/2022>>>> 02/01/2022   Subjective: Patient laying in bed.  Denies any melanotic stools.  No bowel movement since admission however stated just started with a diet.  Tolerating full liquid diet.  States was seen by GI and diet to be advanced to a low fiber diet today.    Objective: Vitals:   01/31/22 2200 01/31/22 2326 02/01/22 0442 02/01/22 0836  BP: 100/67 134/72 135/71 139/69  Pulse: 81 83 75 81  Resp: 20  19 (!) 22  Temp:  98.2 F (36.8 C) 98.2 F (36.8 C) 98.4 F (36.9 C)  TempSrc:  Oral Oral Oral  SpO2: 92% 95% 98% 96%  Weight:   67.7 kg   Height:        Intake/Output Summary (Last 24 hours) at 02/01/2022 1311 Last data filed at 02/01/2022 0927 Gross per 24 hour  Intake 113.22 ml  Output 1100 ml  Net -986.78 ml    Filed Weights   01/28/22 1120 01/31/22 0940 02/01/22 0442  Weight: 65.8 kg 67.2 kg 67.7 kg    Examination:  General exam: NAD.  Improving dry mucous membranes. Respiratory system: CTAB.  No wheezes, no crackles, no rhonchi.  Fair air movement.  Speaking in full sentences.  Cardiovascular system: Regular rate and rhythm no murmurs rubs or gallops.  No JVD.  No lower extremity edema. Gastrointestinal system: Abdomen is soft, nontender, nondistended, positive bowel sounds.  No rebound.  No guarding.  Central nervous system: Alert and oriented. No focal neurological deficits. Extremities:  Symmetric 5 x 5 power. Skin: No rashes, lesions or ulcers Psychiatry: Judgement and insight appear normal. Mood & affect appropriate.     Data Reviewed: I have personally reviewed following labs and imaging studies  CBC: Recent Labs  Lab 01/28/22 1159 01/28/22 1407 01/30/22 0255 01/30/22 1246 01/30/22 1450 01/31/22 0247 01/31/22 1404 02/01/22 0402  WBC 10.6*  --  9.3  --  10.4 9.4  --  10.9*  NEUTROABS 8.4*  --  6.0  --   --   --   --   --   HGB 10.4*   < > 8.7* 9.9* 9.0* 8.6* 9.3* 9.1*  HCT 30.0*   < > 26.7* 29.0* 27.2* 26.5* 28.6* 28.3*  MCV 92.0  --  97.8  --  98.9 99.6  --  99.3  PLT 281  --  212  --  213 197  --  228   < > = values in this interval not displayed.     Basic Metabolic Panel: Recent Labs  Lab 01/28/22 1407 01/28/22 2132 01/29/22 0248 01/29/22 1229 01/30/22 0255 01/30/22 1246 01/30/22 1450 01/31/22 0247 02/01/22 0402  NA  --  133* 135 134* 137 135 136 133* 136  K  --  2.8* 2.8* 3.7 2.7* 4.0 3.7 3.7 4.0  CL  --  100 100 104 104 99 102 100 102  CO2  --  22 25 23  25  --  '26 27 28  '$ GLUCOSE  --  96 86 127* 97 98 102* 94 109*  BUN  --  30* 30* '21 15 10 11 10 9  '$ CREATININE  --  0.97 0.93 0.87 0.90 0.90 0.81 0.92 0.86  CALCIUM  --  8.7* 8.7* 8.3* 8.6*  --  8.4* 8.1* 8.8*  MG 1.8 1.8 2.3  --  2.0  --   --   --  1.7  PHOS  --   --   --   --   --   --   --  2.6  --      GFR: Estimated Creatinine Clearance: 39.9 mL/min (by C-G formula based on SCr of 0.86 mg/dL).  Liver Function Tests: Recent Labs  Lab 01/28/22 1159 01/31/22 0247  AST 26  --   ALT 12  --   ALKPHOS 37*  --   BILITOT 0.7  --   PROT 6.7  --   ALBUMIN 3.8 2.8*     CBG: No results for input(s): "GLUCAP" in the last 168 hours.   Recent Results (from the past 240 hour(s))  MRSA Next Gen by PCR, Nasal     Status: Abnormal   Collection Time: 01/28/22  6:42 PM   Specimen: Nasal Mucosa; Nasal Swab  Result Value Ref Range Status   MRSA by PCR Next Gen DETECTED (A) NOT DETECTED  Final    Comment: (NOTE) The GeneXpert MRSA Assay (FDA approved for NASAL specimens only), is one component of a comprehensive MRSA colonization surveillance program. It is not intended to diagnose MRSA infection nor to guide or monitor treatment for MRSA infections. Test performance is not FDA approved in patients less than 3 years old. Performed at [facility], [place] [address] [place], [place] [address]   Urine Culture     Status: None   Collection Time: 01/29/22  9:03 AM   Specimen: Urine, Catheterized  Result Value Ref Range Status   Specimen Description   Final    URINE, CATHETERIZED Performed at [place] [address], [place], [place] [address]    Special Requests   Final    NONE Performed at [facility], [place] [address], [place], [place] [address]    Culture   Final    NO GROWTH Performed at [facility], [place] [address], [place], [place] [address]    Report Status 01/30/2022 FINAL  Final         Radiology Studies: CT ABDOMEN PELVIS W CONTRAST  Result Date: 01/31/2022 CLINICAL DATA:  Acute abdominal pain, rectal bleeding, abnormal EGD yesterday showing esophageal stenosis distally with possible mass EXAM: CT ABDOMEN AND PELVIS WITH CONTRAST TECHNIQUE: Multidetector CT imaging of the abdomen and pelvis was performed using the standard protocol following bolus administration of intravenous contrast. RADIATION DOSE REDUCTION: This exam was performed according to the departmental dose-optimization program which includes automated exposure control, adjustment of the mA and/or kV according to patient size and/or use of iterative reconstruction technique. CONTRAST:  114m OMNIPAQUE IOHEXOL 300 MG/ML SOLN IV. No oral contrast. COMPARISON:  07/14/2018 FINDINGS: Lower chest: Small bibasilar pleural effusions and associated atelectasis. Calcified granuloma LEFT lower lobe. Bronchiectasis RIGHT middle lobe.  Hepatobiliary: 7 mm calcified gallstone within gallbladder. Liver normal appearance. No biliary dilatation. Pancreas: Normal appearance Spleen: Normal appearance Adrenals/Urinary Tract: Adrenal glands normal appearance. Small RIGHT renal cyst 1.6 cm, simple features by CT; no follow-up imaging recommended. Fat attenuation nodule  inferior pole RIGHT kidney 12 x 6 mm consistent with angiomyolipoma; no follow-up imaging recommended. LEFT hydronephrosis and proximal hydroureter without identified calculus. Tapered narrowing of the proximal LEFT ureter is seen with slight enhancement of the wall, could be inflammatory or stricture but mass not completely excluded. Remaining ureters unremarkable. Bladder decompressed by Foley catheter. Stomach/Bowel: Post appendectomy. Questionable wall thickening of the stomach versus artifacts related underdistention. GE junction decompressed, poorly assessed. Large and small bowel loops unremarkable. Vascular/Lymphatic: Atherosclerotic calcifications aorta and iliac arteries without aneurysm. Vascular structures patent. No adenopathy Reproductive: Small calcified leiomyoma within uterus 12 mm diameter. Uterus and ovaries otherwise unremarkable. Pessary within vagina Other: Small LEFT inguinal hernia containing fat. No free air or free fluid. Edema within celiac axis and posterior to pancreas, nonspecific, may represent pancreatitis or could potentially be related to preceding endoscopy. Musculoskeletal: Osseous demineralization. Superior endplate compression fractures of T12 and L2, increased at L2. Degenerative disc disease changes lumbar spine. IMPRESSION: Questionable wall thickening of the stomach versus artifacts related to underdistention. Edema within celiac axis and posterior to pancreas, nonspecific, may represent pancreatitis or could potentially be related to preceding endoscopy. LEFT hydronephrosis and proximal hydroureter without identified calculus though the proximal  ureter demonstrates mild wall enhancement and tapered narrowing, could represent stricture, inflammation, tumor not excluded; recommend urological consultation. Small bibasilar pleural effusions and associated atelectasis. Cholelithiasis. Small LEFT inguinal hernia containing fat. Superior endplate compression fractures of T12 and L2, increased at L2. Aortic Atherosclerosis (ICD10-I70.0). Electronically Signed   By: Lavonia Dana M.D.   On: 01/31/2022 11:55        Scheduled Meds:  acetaminophen  500 mg Oral TID   Chlorhexidine Gluconate Cloth  6 each Topical Daily   citalopram  20 mg Oral Daily   levothyroxine  50 mcg Oral Once per day on Sun Tue Wed Fri Sat   And   levothyroxine  75 mcg Oral Once per day on Mon Thu   mupirocin ointment  1 Application Nasal BID   pantoprazole (PROTONIX) IV  40 mg Intravenous Q12H   rosuvastatin  5 mg Oral Q M,W,F   Continuous Infusions:  sodium chloride Stopped (01/30/22 1156)     LOS: 4 days    Time spent: 40 minutes    Irine Seal, MD [facility]   To contact the attending provider between 7A-7P or the covering provider during after hours 7P-7A, please log into the web site [email or web address] and access using universal [place] password for that web site. If you do not have the password, please call the hospital operator.  02/01/2022, 1:11 PM

## 2022-02-01 NOTE — Evaluation (Signed)
Physical Therapy Evaluation Patient Details Name: Latoya Curry MRN: [id number] DOB: Jul 02, 1930 Today's Date: 02/01/2022  History of Present Illness  Patient is a pleasant 88 year old female asmitted 01/28/22 with melanotic stools and admitted with GI Bleed. Pt with  history of hypokalemia, hypertension, diverticulosis, GERD, hypertension, dyslipidemia, PVCs  Clinical Impression  Pt admitted with above diagnosis. At baseline pt is independent with use of rollator.  She lives with her significant other but he is unable to provide any physical assist or assist with IADLs.  Pt's family not available to assist at d/c due to life events of their own.  Today, pt required min A with transfers and ambulated 100' with RW but fatigued easily and frequently needed min A to stabilize.  Her AM-PAC score indicates ability to return home; however, pt does not have anyone to provide min A and at this time is fall risk , so recommend SNF.  Of note family reporting pt has not been able to eat much for ~ 1 week, now on full liquid diet, so is obviously weak from this.  She does have potential to progress but at this time recommend SNF. Pt currently with functional limitations due to the deficits listed below (see PT Problem List). Pt will benefit from skilled PT to increase their independence and safety with mobility to allow discharge to the venue listed below.          Recommendations for follow up therapy are one component of a multi-disciplinary discharge planning process, led by the attending physician.  Recommendations may be updated based on patient status, additional functional criteria and insurance authorization.  Follow Up Recommendations Skilled nursing-short term rehab (<3 hours/day) Can patient physically be transported by private vehicle: Yes    Assistance Recommended at Discharge Frequent or constant Supervision/Assistance  Patient can return home with the following  A little help with walking  and/or transfers;A little help with bathing/dressing/bathroom;Assistance with cooking/housework;Help with stairs or ramp for entrance    Equipment Recommendations None recommended by PT  Recommendations for Other Services       Functional Status Assessment Patient has had a recent decline in their functional status and demonstrates the ability to make significant improvements in function in a reasonable and predictable amount of time.     Precautions / Restrictions Precautions Precautions: Fall      Mobility  Bed Mobility Overal bed mobility: Needs Assistance             General bed mobility comments: in chair    Transfers Overall transfer level: Needs assistance Equipment used: Rolling walker (2 wheels) Transfers: Sit to/from Stand Sit to Stand: Min assist           General transfer comment: Min A but with increased time and effort    Ambulation/Gait Ambulation/Gait assistance: Min assist Gait Distance (Feet): 80 Feet Assistive device: Rolling walker (2 wheels) Gait Pattern/deviations: Step-to pattern, Trunk flexed Gait velocity: decreased     General Gait Details: Min A to steady; fatigued easily; 3 standing rest breaks with ambulation  Stairs            Wheelchair Mobility    Modified Rankin (Stroke Patients Only)       Balance Overall balance assessment: Needs assistance Sitting-balance support: No upper extremity supported Sitting balance-Leahy Scale: Good     Standing balance support: Bilateral upper extremity supported Standing balance-Leahy Scale: Poor Standing balance comment: requriing RW and min A at times  Pertinent Vitals/Pain Pain Assessment Pain Assessment: No/denies pain    Home Living Family/patient expects to be discharged to:: Private residence Living Arrangements: Spouse/significant other Available Help at Discharge: Family;Available 24 hours/day (significant other is there but  not able to assist) Type of Home: Apartment (ILF apartment) Home Access: Level entry       Home Layout: One level Home Equipment: Rollator (4 wheels);Grab bars - tub/shower;Grab bars - toilet Additional Comments: Pt has 2 daughters but they don't live close and have events/circumstances that are occuring right now and won't be able to assist    Prior Function Prior Level of Function : Independent/Modified Independent             Mobility Comments: Ambulates with rollator; can do short community distances easily, does longer distances but more difficulty/rest breaks ADLs Comments: independent adls and light iadls     Hand Dominance   Dominant Hand: Right    Extremity/Trunk Assessment   Upper Extremity Assessment Upper Extremity Assessment: Overall WFL for tasks assessed    Lower Extremity Assessment Lower Extremity Assessment: LLE deficits/detail;RLE deficits/detail RLE Deficits / Details: ROM WFL; MMT 5/5 ankle and knee , 4/5 hip LLE Deficits / Details: ROM WFL; MMT 5/5 ankle and knee , 4/5 hip    Cervical / Trunk Assessment Cervical / Trunk Assessment: Kyphotic  Communication   Communication: HOH  Cognition Arousal/Alertness: Awake/alert Behavior During Therapy: WFL for tasks assessed/performed Overall Cognitive Status: Within Functional Limits for tasks assessed                                          General Comments General comments (skin integrity, edema, etc.): VSS    Exercises     Assessment/Plan    PT Assessment Patient needs continued PT services  PT Problem List Decreased strength;Decreased range of motion;Decreased activity tolerance;Decreased knowledge of use of DME;Decreased balance;Decreased safety awareness;Decreased mobility;Decreased knowledge of precautions       PT Treatment Interventions DME instruction;Therapeutic exercise;Gait training;Balance training;Stair training;Functional mobility training;Therapeutic  activities;Patient/family education    PT Goals (Current goals can be found in the Care Plan section)  Acute Rehab PT Goals Patient Stated Goal: return home; prefers home but agreeable to rehab; family prefers short term rehab for safety PT Goal Formulation: With patient/family Time For Goal Achievement: 02/15/22 Potential to Achieve Goals: Good    Frequency Min 3X/week     Co-evaluation               AM-PAC PT "6 Clicks" Mobility  Outcome Measure Help needed turning from your back to your side while in a flat bed without using bedrails?: A Little Help needed moving from lying on your back to sitting on the side of a flat bed without using bedrails?: A Little Help needed moving to and from a bed to a chair (including a wheelchair)?: A Little Help needed standing up from a chair using your arms (e.g., wheelchair or bedside chair)?: A Little Help needed to walk in hospital room?: A Little Help needed climbing 3-5 steps with a railing? : A Lot 6 Click Score: 17    End of Session Equipment Utilized During Treatment: Gait belt Activity Tolerance: Patient tolerated treatment well Patient left: with chair alarm set;in chair;with family/visitor present;with call bell/phone within reach Nurse Communication: Mobility status PT Visit Diagnosis: Unsteadiness on feet (R26.81);Muscle weakness (generalized) (M62.81)    Time: [phone]  PT Time Calculation (min) (ACUTE ONLY): 28 min   Charges:   PT Evaluation $PT Eval Low Complexity: 1 Low PT Treatments $Gait Training: 8-22 mins        Latoya Curry, PT Acute Rehab [facility] [phone]   Latoya Curry 02/01/2022, 10:09 AM

## 2022-02-01 NOTE — TOC Progression Note (Signed)
Transition of Care [facility]) - Progression Note    Patient Details  Name: YENY SCHMOLL MRN: [id number] Date of Birth: 04/22/1930  Transition of [facility]) CM/SW Contact  Leeroy Cha, RN Phone Number: 02/01/2022, 10:21 AM  Clinical Narrative:     Request for snf stay for s.t. rehab made.  Fl2 completed and sent out to area snf including [place].  Insurance will require auth through [place].  Expected Discharge Plan: [place] Barriers to Discharge: Continued Medical Work up  Expected Discharge Plan and Services Expected Discharge Plan: [place]   Discharge Planning Services: CM Consult   Living arrangements for the past 2 months: Single Family Home                                       Social Determinants of Health (SDOH) Interventions    Readmission Risk Interventions   No data to display

## 2022-02-02 DIAGNOSIS — K625 Hemorrhage of anus and rectum: Secondary | ICD-10-CM | POA: Diagnosis not present

## 2022-02-02 DIAGNOSIS — F419 Anxiety disorder, unspecified: Secondary | ICD-10-CM | POA: Diagnosis not present

## 2022-02-02 DIAGNOSIS — E876 Hypokalemia: Secondary | ICD-10-CM | POA: Diagnosis not present

## 2022-02-02 DIAGNOSIS — K922 Gastrointestinal hemorrhage, unspecified: Secondary | ICD-10-CM | POA: Diagnosis not present

## 2022-02-02 LAB — BASIC METABOLIC PANEL
Anion gap: 9 (ref 5–15)
BUN: 7 mg/dL — ABNORMAL LOW (ref 8–23)
CO2: 26 mmol/L (ref 22–32)
Calcium: 8.5 mg/dL — ABNORMAL LOW (ref 8.9–10.3)
Chloride: 99 mmol/L (ref 98–111)
Creatinine, Ser: 0.84 mg/dL (ref 0.44–1.00)
GFR, Estimated: >60 mL/min (ref >60)
Glucose, Bld: 107 mg/dL — ABNORMAL HIGH (ref 70–99)
Potassium: 3.5 mmol/L (ref 3.5–5.1)
Sodium: 134 mmol/L — ABNORMAL LOW (ref 135–145)

## 2022-02-02 LAB — CBC
HCT: 28 % — ABNORMAL LOW (ref 36.0–46.0)
Hemoglobin: 9.1 g/dL — ABNORMAL LOW (ref 12.0–15.0)
MCH: 31.9 pg (ref 26.0–34.0)
MCHC: 32.5 g/dL (ref 30.0–36.0)
MCV: 98.2 fL (ref 80.0–100.0)
Platelets: 237 10*3/uL (ref 150–400)
RBC: 2.85 MIL/uL — ABNORMAL LOW (ref 3.87–5.11)
RDW: 14.6 % (ref 11.5–15.5)
WBC: 10.2 10*3/uL (ref 4.0–10.5)
nRBC: 0 % (ref 0.0–0.2)

## 2022-02-02 LAB — MAGNESIUM: Magnesium: 1.8 mg/dL (ref 1.7–2.4)

## 2022-02-02 MED ORDER — LORATADINE 10 MG PO TABS
10.0000 mg | ORAL_TABLET | Freq: Every day | ORAL | Status: DC
  Administered 2022-02-02 – 2022-02-05 (×4): 10 mg via ORAL
  Filled 2022-02-02 (×4): qty 1

## 2022-02-02 MED ORDER — VITAMIN D 25 MCG (1000 UNIT) PO TABS
1000.0000 [IU] | ORAL_TABLET | Freq: Every day | ORAL | Status: DC
  Administered 2022-02-02 – 2022-02-05 (×4): 1000 [IU] via ORAL
  Filled 2022-02-02 (×4): qty 1

## 2022-02-02 MED ORDER — POTASSIUM CHLORIDE CRYS ER 20 MEQ PO TBCR: 20.0000 meq | EXTENDED_RELEASE_TABLET | Freq: Two times a day (BID) | ORAL | Status: DC

## 2022-02-02 MED ORDER — VITAMIN D3 25 MCG (1000 UT) PO CHEW: 1000.0000 [IU] | CHEWABLE_TABLET | Freq: Every day | ORAL | Status: DC

## 2022-02-02 MED ORDER — TRAMADOL HCL 50 MG PO TABS
50.0000 mg | ORAL_TABLET | Freq: Two times a day (BID) | ORAL | Status: DC | PRN
  Administered 2022-02-03 – 2022-02-05 (×2): 50 mg via ORAL
  Filled 2022-02-02 (×2): qty 1

## 2022-02-02 MED ORDER — PANTOPRAZOLE SODIUM 40 MG PO TBEC
40.0000 mg | DELAYED_RELEASE_TABLET | Freq: Two times a day (BID) | ORAL | Status: DC
  Administered 2022-02-02 – 2022-02-05 (×6): 40 mg via ORAL
  Filled 2022-02-02 (×6): qty 1

## 2022-02-02 MED ORDER — MAGNESIUM SULFATE 2 GM/50ML IV SOLN
2.0000 g | Freq: Once | INTRAVENOUS | Status: AC
  Administered 2022-02-02: 2 g via INTRAVENOUS
  Filled 2022-02-02: qty 50

## 2022-02-02 MED ORDER — POTASSIUM CHLORIDE CRYS ER 10 MEQ PO TBCR
20.0000 meq | EXTENDED_RELEASE_TABLET | Freq: Every day | ORAL | Status: DC
  Administered 2022-02-02 – 2022-02-03 (×2): 20 meq via ORAL
  Filled 2022-02-02 (×2): qty 1
  Filled 2022-02-02 (×2): qty 2

## 2022-02-02 NOTE — Progress Notes (Signed)
[facility] Progress Note  SUBJECTIVE:   Interval history: Latoya Curry was seen and evaluated today at bedside.  She denied any chest pain or shortness of breath.  Had no nausea or vomiting.  Has not had a bowel movement recently.  She is tolerating a clear liquid diet and is pending low fiber breakfast.  Past Medical History:  Diagnosis Date   Acute bronchitis 05/07/2020   Acute gangrenous appendicitis s/p lap appendectomy 06/24/2018 06/23/2018   Adjustment disorder 05/07/2020   Allergic bronchitis 05/07/2020   Allergic rhinitis 05/07/2020   Aortic regurgitation 05/10/2019   Aortic root enlargement ([place]) 08/19/2017   Appendicitis 05/07/2020   Ascending aorta dilation (HCC) 08/19/2017   Benign paroxysmal positional vertigo 05/07/2020   Bronchitis 05/07/2020   Cardiac arrhythmia 05/07/2020   Chest discomfort 08/05/2017   Chest pain 05/07/2020   Chronic low back pain    Conjunctivitis of left eye 05/07/2020   Constipation 05/07/2020   Cough 09/23/2011   Followed in Pulmonary clinic/ [facility]/ [place]   - Sinus CT 09/25/2011 > neg    Diaphoresis 05/07/2020   Diarrhea 05/07/2020   Diverticular disease of colon 05/07/2020   Diverticulitis    Diverticulitis of colon 05/07/2020   DJD (degenerative joint disease) of knee    Eczema 05/07/2020   Encounter for general adult medical examination without abnormal findings 05/07/2020   Epistaxis 05/07/2020   Essential hypertension 08/05/2017   Fever 05/07/2020   Gastro-esophageal reflux disease without esophagitis 05/07/2020   Graves' disease 05/07/2020   Hearing loss 05/07/2020   History of diverticulitis 05/07/2020   Hypercholesteremia    Hypertension    Hyperthyroidism 05/07/2020   Hypokalemia 06/24/2018   Incarcerated incisional hernia s/p primary repair 06/24/2018 06/24/2018   Incontinence without sensory awareness 05/07/2020   Insomnia 05/07/2020   Left lower quadrant pain 05/07/2020   Loss of appetite 05/07/2020   Low back pain 05/07/2020    Mixed dyslipidemia 08/05/2017   Nasal crusting 05/07/2020   Neck pain 05/07/2020   Neurodermatitis    Noninflammatory disorder of vagina 05/07/2020   Obesity    Osteopenia    Other long term (current) drug therapy 05/07/2020   Pain in left hip 03/16/2019   Pain in right hand 05/07/2020   Postnasal drip 05/07/2020   Problem related to social environment 05/07/2020   Pure hypercholesterolemia 05/07/2020   PVC (premature ventricular contraction) 08/05/2017   Rash 05/07/2020   Recurrent UTI    Right hip pain 05/07/2020   Sciatica 05/07/2020   Skin sensation disturbance 05/07/2020   Sleep disorder 05/07/2020   Small bowel obstruction, partial, s/p lap adhesiolysis 06/24/2018 06/24/2018   Spasm 05/07/2020   Thoracic aortic aneurysm [facility])    Thoracic aortic aneurysm without rupture ([facility]) 05/07/2020   Thoracic aortic atherosclerosis ([place]) 01/11/2020   Thyrotoxicosis 05/07/2020   Transient global amnesia    Urinary tract infectious disease 05/07/2020   Viral syndrome 05/07/2020   Vitamin D deficiency 05/07/2020   Weight loss 05/07/2020   Past Surgical History:  Procedure Laterality Date   APPENDECTOMY     COLON RESECTION SIGMOID  2004   Dr Dalbert Batman   EXPLORATORY LAPAROTOMY  2004   r/o pancreas mass.  Dr Dalbert Batman   IR RADIOLOGIST EVAL & MGMT  07/14/2018   LAPAROSCOPIC APPENDECTOMY N/A 06/24/2018   Procedure: ACUTE GANGRENOUS APPENDICITIS WITH ABSCESS, PARTIAL SMALL BOWEL OBSTRUCTION, INCISIONAL HERNIA REPAIR, LYSIS OF ADHESIONS;  Surgeon: Michael Boston, MD;  Location: [facility];  Service: General;  Laterality: N/A;  TOTAL KNEE ARTHROPLASTY     bilaterallly   Current Facility-Administered Medications  Medication Dose Route Frequency Provider Last Rate Last Admin   0.9 %  sodium chloride infusion   Intravenous PRN Wilford Corner, MD   Stopped at 01/30/22 1156   acetaminophen (TYLENOL) tablet 650 mg  650 mg Oral Q6H PRN Wilford Corner, MD   650 mg at 01/31/22 0146   Or   acetaminophen (TYLENOL)  suppository 650 mg  650 mg Rectal Q6H PRN Wilford Corner, MD       acetaminophen (TYLENOL) tablet 500 mg  500 mg Oral TID Wilford Corner, MD   500 mg at 02/02/22 0349   Chlorhexidine Gluconate Cloth 2 % PADS 6 each  6 each Topical Daily Wilford Corner, MD   6 each at 02/01/22 1791   cholecalciferol (VITAMIN D3) 25 MCG (1000 UNIT) tablet 1,000 Units  1,000 Units Oral Daily Eugenie Filler, MD       citalopram (CELEXA) tablet 20 mg  20 mg Oral Daily Wilford Corner, MD   20 mg at 02/02/22 5056   levothyroxine (SYNTHROID) tablet 50 mcg  50 mcg Oral Once per day on Sun Tue Wed Fri Sat Wilford Corner, MD   50 mcg at 02/02/22 9794   And   levothyroxine (SYNTHROID) tablet 75 mcg  75 mcg Oral Once per day on Mon Thu Schooler, Vincent, MD   75 mcg at 01/30/22 0612   loratadine (CLARITIN) tablet 10 mg  10 mg Oral Daily Eugenie Filler, MD       magnesium sulfate IVPB 2 g 50 mL  2 g Intravenous Once Eugenie Filler, MD       melatonin tablet 3 mg  3 mg Oral QHS PRN Wilford Corner, MD   3 mg at 01/31/22 2349   ondansetron (ZOFRAN) tablet 4 mg  4 mg Oral Q6H PRN Wilford Corner, MD       Or   ondansetron [facility]) injection 4 mg  4 mg Intravenous Q6H PRN Wilford Corner, MD       Oral care mouth rinse  15 mL Mouth Rinse PRN Wilford Corner, MD       pantoprazole (PROTONIX) injection 40 mg  40 mg Intravenous Q12H Wilford Corner, MD   40 mg at 02/02/22 8016   potassium chloride SA (KLOR-CON M) CR tablet 20 mEq  20 mEq Oral Daily Eugenie Filler, MD       rosuvastatin (CRESTOR) tablet 5 mg  5 mg Oral Q M,W,F Wilford Corner, MD   5 mg at 01/31/22 5537   traMADol (ULTRAM) tablet 50 mg  50 mg Oral Q12H PRN Eugenie Filler, MD       Allergies as of 01/28/2022 - Review Complete 01/28/2022  Allergen Reaction Noted   Levofloxacin Other (See Comments) 04/18/2020   Codeine Nausea And Vomiting 06/12/2016   Simvastatin Other (See Comments) 06/12/2016    Sulfamethoxazole-trimethoprim Other (See Comments) 06/12/2016   Zestril [lisinopril] Cough 06/12/2016   Review of Systems:  Review of Systems  Respiratory:  Negative for shortness of breath.   Cardiovascular:  Negative for chest pain.  Gastrointestinal:  Negative for abdominal pain, nausea and vomiting.    OBJECTIVE:   Temp:  [98.1 F (36.7 C)-98.8 F (37.1 C)] 98.8 F (37.1 C) (12/10 4827) Pulse Rate:  [84-86] 85 (12/10 0633) Resp:  [17-23] 17 (12/10 0786) BP: (111-133)/(66-77) 111/77 (12/10 7544) SpO2:  [94 %-96 %] 96 % (12/10 9201) Last BM Date : 01/29/22 Physical Exam  Constitutional:      General: She is not in acute distress.    Appearance: She is not ill-appearing, toxic-appearing or diaphoretic.  Cardiovascular:     Rate and Rhythm: Normal rate and regular rhythm.  Pulmonary:     Effort: No respiratory distress.     Breath sounds: Normal breath sounds.  Abdominal:     General: Bowel sounds are normal. There is no distension.     Palpations: Abdomen is soft.     Tenderness: There is no abdominal tenderness. There is no guarding.  Neurological:     Mental Status: She is alert.     Labs: Recent Labs    01/31/22 0247 01/31/22 1404 02/01/22 0402 02/02/22 0331  WBC 9.4  --  10.9* 10.2  HGB 8.6* 9.3* 9.1* 9.1*  HCT 26.5* 28.6* 28.3* 28.0*  PLT 197  --  228 237   BMET Recent Labs    01/31/22 0247 02/01/22 0402 02/02/22 0331  NA 133* 136 134*  K 3.7 4.0 3.5  CL 100 102 99  CO2 '27 28 26  '$ GLUCOSE 94 109* 107*  BUN 10 9 7*  CREATININE 0.92 0.86 0.84  CALCIUM 8.1* 8.8* 8.5*   LFT Recent Labs    01/31/22 0247  ALBUMIN 2.8*   PT/INR No results for input(s): "LABPROT", "INR" in the last 72 hours. Diagnostic imaging: CT ABDOMEN PELVIS W CONTRAST  Result Date: 01/31/2022 CLINICAL DATA:  Acute abdominal pain, rectal bleeding, abnormal EGD yesterday showing esophageal stenosis distally with possible mass EXAM: CT ABDOMEN AND PELVIS WITH CONTRAST  TECHNIQUE: Multidetector CT imaging of the abdomen and pelvis was performed using the standard protocol following bolus administration of intravenous contrast. RADIATION DOSE REDUCTION: This exam was performed according to the departmental dose-optimization program which includes automated exposure control, adjustment of the mA and/or kV according to patient size and/or use of iterative reconstruction technique. CONTRAST:  160m OMNIPAQUE IOHEXOL 300 MG/ML SOLN IV. No oral contrast. COMPARISON:  07/14/2018 FINDINGS: Lower chest: Small bibasilar pleural effusions and associated atelectasis. Calcified granuloma LEFT lower lobe. Bronchiectasis RIGHT middle lobe. Hepatobiliary: 7 mm calcified gallstone within gallbladder. Liver normal appearance. No biliary dilatation. Pancreas: Normal appearance Spleen: Normal appearance Adrenals/Urinary Tract: Adrenal glands normal appearance. Small RIGHT renal cyst 1.6 cm, simple features by CT; no follow-up imaging recommended. Fat attenuation nodule inferior pole RIGHT kidney 12 x 6 mm consistent with angiomyolipoma; no follow-up imaging recommended. LEFT hydronephrosis and proximal hydroureter without identified calculus. Tapered narrowing of the proximal LEFT ureter is seen with slight enhancement of the wall, could be inflammatory or stricture but mass not completely excluded. Remaining ureters unremarkable. Bladder decompressed by Foley catheter. Stomach/Bowel: Post appendectomy. Questionable wall thickening of the stomach versus artifacts related underdistention. GE junction decompressed, poorly assessed. Large and small bowel loops unremarkable. Vascular/Lymphatic: Atherosclerotic calcifications aorta and iliac arteries without aneurysm. Vascular structures patent. No adenopathy Reproductive: Small calcified leiomyoma within uterus 12 mm diameter. Uterus and ovaries otherwise unremarkable. Pessary within vagina Other: Small LEFT inguinal hernia containing fat. No free air or  free fluid. Edema within celiac axis and posterior to pancreas, nonspecific, may represent pancreatitis or could potentially be related to preceding endoscopy. Musculoskeletal: Osseous demineralization. Superior endplate compression fractures of T12 and L2, increased at L2. Degenerative disc disease changes lumbar spine. IMPRESSION: Questionable wall thickening of the stomach versus artifacts related to underdistention. Edema within celiac axis and posterior to pancreas, nonspecific, may represent pancreatitis or could potentially be related to preceding endoscopy. LEFT hydronephrosis and  proximal hydroureter without identified calculus though the proximal ureter demonstrates mild wall enhancement and tapered narrowing, could represent stricture, inflammation, tumor not excluded; recommend urological consultation. Small bibasilar pleural effusions and associated atelectasis. Cholelithiasis. Small LEFT inguinal hernia containing fat. Superior endplate compression fractures of T12 and L2, increased at L2. Aortic Atherosclerosis (ICD10-I70.0). Electronically Signed   By: Lavonia Dana M.D.   On: 01/31/2022 11:55    IMPRESSION: Melena Normocytic anemia, stable Intrinsic severe stenosis of pylorus             -Seen on EGD 01/30/22 Gastric inflammation, linear erosions in antrum             -Seen on EGD 01/30/2022             -Pathology remains pending Abnormal CT imaging History hypertension History diverticulosis  PLAN: -Patient appears to be doing well clinically, pending low fiber breakfast this a.m. -Hgb stable this AM -Change to oral PPI therapy -Follow-up pathology from gastric biopsies on EGD 01/30/2022 -If patient does well with diet today and Hgb stable, appears to be appropriate for discharge from GI standpoint with outpatient follow up with [facility]   LOS: 5 days   Danton Clap, [facility]

## 2022-02-02 NOTE — Progress Notes (Signed)
PROGRESS NOTE    Latoya Curry  JKK:[id number] DOB: [birth year] DOA: 01/28/2022 PCP: [name], NP    Chief Complaint  Patient presents with   Rectal Bleeding    Brief Narrative:  Patient is a pleasant 88 year old female history of hypokalemia, hypertension, diverticulosis, GERD, hypertension, dyslipidemia, PVCs presented to the ED with melanotic stools.  Patient denied any consistent NSAID use stated use Aleve occasionally for pain and aspirin 3 times weekly.  Patient called PCPs office and directed to the ED.  FOBT done was positive.  Hemoglobin noted at 9.5 on presentation with baseline hemoglobin 11.4 (01/08/2022.).  Patient also noted with urinary retention.  Patient admitted, serial CBCs obtained, GI consulted for further evaluation and management.   Assessment & Plan:   Principal Problem:   Acute GI bleeding Active Problems:   Gastritis determined by endoscopy   Gastric stenosis   Essential hypertension   Mixed dyslipidemia   Hypokalemia   Chronic low back pain   Hypercholesteremia   Hypertension   Graves' disease   Hyperthyroidism   Hypothyroidism (acquired)   Anxiety and depression   UGI bleed   Rectal bleed  1 probable upper GI bleed/melanotic stools/acute blood loss anemia/gastritis and gastric stenosis at pylorus.  EGD 01/30/2022 -Patient presented with episodes of melanotic stools, last melanotic stool 5 days ago. -Patient with history of diverticulosis. -Hemoglobin on presentation noted at 9.5 from 14.4 (01/08/2022) -Patient seen in consultation by GI and patient underwent EGD 01/30/2022 that was consistent with gastritis, biopsies done, gastric stenosis noted at the pylorus.   -CT abdomen and pelvis ordered per GI which was done 01/31/2022 which was negative for any extrinsic causes for gastric stenosis.  -Hemoglobin currently stable at 9.1 this morning. -Was on the Protonix drip and has been transition to IV PPI every 12 hours.  -If continued  improvement likely transition to oral PPI twice daily tomorrow. -Continue serial CBCs. -Currently tolerating low residue/low fiber diet in the setting of pyloric stenosis as per GI.  -Transfuse transfusion threshold Hgb < 7. -GI following and appreciate their input and recommendations.  2.  Hypokalemia -Potassium noted at 2.1 on day of admission, -Status post IV potassium and KCl and IV fluids. -Repleted currently at 3.5 -Magnesium at 1.8. -Magnesium sulfate 2 g IV x 1. -Resume home regimen Kdur 20 mEq daily.   -Follow.  3.  Hypothyroidism -Synthroid.   4..  Urinary retention -Status post Foley catheter placement this admission. -Urinalysis with concerns for possible UTI. -Flomax discontinued.   -CT abdomen and pelvis concerning for left hydroureteronephrosis.  See below. -Status post 3 days IV Rocephin.  -Patient seen in consultation by urology and are in agreement to a voiding trial which patient passed on Feb 17, 2022.  Patient still with good urine output. -Outpatient follow-up with urology.  5.  Depression/anxiety - Celexa.  6.  Hypertension -Blood pressure noted borderline likely secondary to acute GI bleed early on in the hospitalization.. -BP improved with hydration.   -Continue to hold antihypertensive medications.   -Flomax discontinued.   -Saline lock IV fluids.    7.  Hyperlipidemia -Continue statin.  8.  Probable UTI versus bacteriuria. -Patient noted to urinary retention on admission. -Urinalysis with moderate leukocytes, nitrite negative, many bacteria, 11-20 WBCs. -Urine cultures with no growth to date.   -Status post 3 days IV Rocephin.   -No further antibiotics needed.   9.  Left hydrouteronephrosis -Noted incidentally on CT abdomen and pelvis done 01/31/2022. -Patient noted to have also had  urinary retention on admission Foley catheter replaced. -Urology consulted who assessed patient and felt okay for voiding trial which was done 02/01/2022, patient  with good urine output.  Urology recommending outpatient follow-up with patient's primary urologist Dr. Diona Fanti for further diagnostic evaluation and recommendations.    DVT prophylaxis: SCDs Code Status: DNR Family Communication: Updated patient, no family at bedside.  Disposition: SNF versus home with home health, once cleared by GI, bed available hopefully tomorrow.    Status is: Inpatient Remains inpatient appropriate because: Severity of illness   Consultants:  Gastroenterology: Dr. Carlean Purl 01/29/2022 Urology: Dr. Alinda Money 01/31/2022  Procedures:  Upper endoscopy per Dr. Michail Sermon 01/30/2022 CT abdomen and pelvis 01/31/2022  Antimicrobials:  IV Rocephin 01/30/2022>>>> 02/01/2022   Subjective: Sitting up in chair.  Daughter at bedside.  No melanotic stools.  No chest pain.  No shortness of breath.  No abdominal pain.  Tolerating current low fiber diet.  States has good urine output.   Objective: Vitals:   02/01/22 0836 02/01/22 1314 02/01/22 2154 02/02/22 0633  BP: 139/69 120/66 133/73 111/77  Pulse: 81 84 86 85  Resp: (!) 22 (!) 23  17  Temp: 98.4 F (36.9 C) 98.1 F (36.7 C) 98.2 F (36.8 C) 98.8 F (37.1 C)  TempSrc: Oral Oral Oral Oral  SpO2: 96% 94% 95% 96%  Weight:      Height:        Intake/Output Summary (Last 24 hours) at 02/02/2022 1304 Last data filed at 02/02/2022 1200 Gross per 24 hour  Intake 140 ml  Output 945 ml  Net -805 ml    Filed Weights   01/28/22 1120 01/31/22 0940 02/01/22 0442  Weight: 65.8 kg 67.2 kg 67.7 kg    Examination:  General exam: NAD. Respiratory system: Lungs clear to auscultation bilaterally.  No wheezes, no crackles, no rhonchi.  Fair air movement.  Speaking in full sentences.   Cardiovascular system: RRR no murmurs rubs or gallops.  No JVD.  No lower extremity edema.  Gastrointestinal system: Abdomen is soft, nontender, nondistended, positive bowel sounds.  No rebound.  No guarding.  Central nervous system: Alert and  oriented. No focal neurological deficits. Extremities: Symmetric 5 x 5 power. Skin: No rashes, lesions or ulcers Psychiatry: Judgement and insight appear normal. Mood & affect appropriate.     Data Reviewed: I have personally reviewed following labs and imaging studies  CBC: Recent Labs  Lab 01/28/22 1159 01/28/22 1407 01/30/22 0255 01/30/22 1246 01/30/22 1450 01/31/22 0247 01/31/22 1404 02/01/22 0402 02/02/22 0331  WBC 10.6*  --  9.3  --  10.4 9.4  --  10.9* 10.2  NEUTROABS 8.4*  --  6.0  --   --   --   --   --   --   HGB 10.4*   < > 8.7*   < > 9.0* 8.6* 9.3* 9.1* 9.1*  HCT 30.0*   < > 26.7*   < > 27.2* 26.5* 28.6* 28.3* 28.0*  MCV 92.0  --  97.8  --  98.9 99.6  --  99.3 98.2  PLT 281  --  212  --  213 197  --  228 237   < > = values in this interval not displayed.     Basic Metabolic Panel: Recent Labs  Lab 01/28/22 2132 01/29/22 0248 01/29/22 1229 01/30/22 0255 01/30/22 1246 01/30/22 1450 01/31/22 0247 02/01/22 0402 02/02/22 0331  NA 133* 135   < > 137 135 136 133* 136 134*  K 2.8*  2.8*   < > 2.7* 4.0 3.7 3.7 4.0 3.5  CL 100 100   < > 104 99 102 100 102 99  CO2 22 25   < > 25  --  '26 27 28 26  '$ GLUCOSE 96 86   < > 97 98 102* 94 109* 107*  BUN 30* 30*   < > '15 10 11 10 9 '$ 7*  CREATININE 0.97 0.93   < > 0.90 0.90 0.81 0.92 0.86 0.84  CALCIUM 8.7* 8.7*   < > 8.6*  --  8.4* 8.1* 8.8* 8.5*  MG 1.8 2.3  --  2.0  --   --   --  1.7 1.8  PHOS  --   --   --   --   --   --  2.6  --   --    < > = values in this interval not displayed.     GFR: Estimated Creatinine Clearance: 40.9 mL/min (by C-G formula based on SCr of 0.84 mg/dL).  Liver Function Tests: Recent Labs  Lab 01/28/22 1159 01/31/22 0247  AST 26  --   ALT 12  --   ALKPHOS 37*  --   BILITOT 0.7  --   PROT 6.7  --   ALBUMIN 3.8 2.8*     CBG: No results for input(s): "GLUCAP" in the last 168 hours.   Recent Results (from the past 240 hour(s))  MRSA Next Gen by PCR, Nasal     Status: Abnormal    Collection Time: 01/28/22  6:42 PM   Specimen: Nasal Mucosa; Nasal Swab  Result Value Ref Range Status   MRSA by PCR Next Gen DETECTED (A) NOT DETECTED Final    Comment: (NOTE) The GeneXpert MRSA Assay (FDA approved for NASAL specimens only), is one component of a comprehensive MRSA colonization surveillance program. It is not intended to diagnose MRSA infection nor to guide or monitor treatment for MRSA infections. Test performance is not FDA approved in patients less than 65 years old. Performed at [facility], [place] [address], [place], [place] [address]   Urine Culture     Status: None   Collection Time: 01/29/22  9:03 AM   Specimen: Urine, Catheterized  Result Value Ref Range Status   Specimen Description   Final    URINE, CATHETERIZED Performed at [place] [address] [place], [place] [address]    Special Requests   Final    NONE Performed at [facility], [place] [address] [place], [place] [address]    Culture   Final    NO GROWTH Performed at [facility], [place] [address], [place], [place] [address]    Report Status 01/30/2022 FINAL  Final         Radiology Studies: No results found.      Scheduled Meds:  acetaminophen  500 mg Oral TID   cholecalciferol  1,000 Units Oral Daily   citalopram  20 mg Oral Daily   levothyroxine  50 mcg Oral Once per day on Sun Tue Wed Fri Sat   And   levothyroxine  75 mcg Oral Once per day on Mon Thu   loratadine  10 mg Oral Daily   pantoprazole (PROTONIX) IV  40 mg Intravenous Q12H   potassium chloride SA  20 mEq Oral Daily   rosuvastatin  5 mg Oral Q M,W,F   Continuous Infusions:  sodium chloride Stopped (01/30/22 1156)     LOS:  5 days    Time spent: 40 minutes    Irine Seal, MD [facility]   To contact the attending provider between 7A-7P or the covering provider during after hours 7P-7A, please log into the web site  [email or web address] and access using universal [place] password for that web site. If you do not have the password, please call the hospital operator.  02/02/2022, 1:04 PM

## 2022-02-02 NOTE — Plan of Care (Signed)

## 2022-02-03 ENCOUNTER — Encounter (HOSPITAL_COMMUNITY): Payer: Self-pay | Admitting: Gastroenterology

## 2022-02-03 DIAGNOSIS — K922 Gastrointestinal hemorrhage, unspecified: Secondary | ICD-10-CM | POA: Diagnosis not present

## 2022-02-03 DIAGNOSIS — F419 Anxiety disorder, unspecified: Secondary | ICD-10-CM | POA: Diagnosis not present

## 2022-02-03 DIAGNOSIS — K625 Hemorrhage of anus and rectum: Secondary | ICD-10-CM | POA: Diagnosis not present

## 2022-02-03 DIAGNOSIS — E876 Hypokalemia: Secondary | ICD-10-CM | POA: Diagnosis not present

## 2022-02-03 LAB — CBC
HCT: 28.4 % — ABNORMAL LOW (ref 36.0–46.0)
Hemoglobin: 9.3 g/dL — ABNORMAL LOW (ref 12.0–15.0)
MCH: 31.8 pg (ref 26.0–34.0)
MCHC: 32.7 g/dL (ref 30.0–36.0)
MCV: 97.3 fL (ref 80.0–100.0)
Platelets: 255 10*3/uL (ref 150–400)
RBC: 2.92 MIL/uL — ABNORMAL LOW (ref 3.87–5.11)
RDW: 14.6 % (ref 11.5–15.5)
WBC: 9.7 10*3/uL (ref 4.0–10.5)
nRBC: 0 % (ref 0.0–0.2)

## 2022-02-03 LAB — BASIC METABOLIC PANEL
Anion gap: 9 (ref 5–15)
BUN: 9 mg/dL (ref 8–23)
CO2: 26 mmol/L (ref 22–32)
Calcium: 8.5 mg/dL — ABNORMAL LOW (ref 8.9–10.3)
Chloride: 99 mmol/L (ref 98–111)
Creatinine, Ser: 1.1 mg/dL — ABNORMAL HIGH (ref 0.44–1.00)
GFR, Estimated: 48 mL/min — ABNORMAL LOW (ref >60)
Glucose, Bld: 120 mg/dL — ABNORMAL HIGH (ref 70–99)
Potassium: 3.3 mmol/L — ABNORMAL LOW (ref 3.5–5.1)
Sodium: 134 mmol/L — ABNORMAL LOW (ref 135–145)

## 2022-02-03 LAB — MAGNESIUM: Magnesium: 2 mg/dL (ref 1.7–2.4)

## 2022-02-03 MED ORDER — POTASSIUM CHLORIDE CRYS ER 10 MEQ PO TBCR
40.0000 meq | EXTENDED_RELEASE_TABLET | Freq: Once | ORAL | Status: AC
  Administered 2022-02-03: 40 meq via ORAL
  Filled 2022-02-03: qty 2

## 2022-02-03 MED ORDER — ALUM & MAG HYDROXIDE-SIMETH 200-200-20 MG/5ML PO SUSP: 30.0000 mL | Freq: Four times a day (QID) | ORAL | Status: DC | PRN

## 2022-02-03 MED ORDER — CALCIUM CARBONATE ANTACID 500 MG PO CHEW
1.0000 | CHEWABLE_TABLET | Freq: Three times a day (TID) | ORAL | Status: DC | PRN
  Administered 2022-02-03: 200 mg via ORAL
  Filled 2022-02-03: qty 1

## 2022-02-03 NOTE — Care Management Important Message (Signed)
Important Message  Patient Details IM Letter given. Name: Latoya Curry MRN: [id number] Date of Birth: 1930-04-19   Medicare Important Message Given:  Yes     Kerin Salen 02/03/2022, 1:33 PM

## 2022-02-03 NOTE — Progress Notes (Signed)
PROGRESS NOTE    Latoya Curry  WUJ:[id number] DOB: 08-21-1930 DOA: 01/28/2022 PCP: [name], NP    Chief Complaint  Patient presents with   Rectal Bleeding    Brief Narrative:  Patient is a pleasant 88 year old female history of hypokalemia, hypertension, diverticulosis, GERD, hypertension, dyslipidemia, PVCs presented to the ED with melanotic stools.  Patient denied any consistent NSAID use stated use Aleve occasionally for pain and aspirin 3 times weekly.  Patient called PCPs office and directed to the ED.  FOBT done was positive.  Hemoglobin noted at 9.5 on presentation with baseline hemoglobin 11.4 (01/08/2022.).  Patient also noted with urinary retention.  Patient admitted, serial CBCs obtained, GI consulted for further evaluation and management.   Assessment & Plan:   Principal Problem:   Acute GI bleeding Active Problems:   Gastritis determined by endoscopy   Gastric stenosis   Essential hypertension   Mixed dyslipidemia   Hypokalemia   Chronic low back pain   Hypercholesteremia   Hypertension   Graves' disease   Hyperthyroidism   Hypothyroidism (acquired)   Anxiety and depression   UGI bleed   Rectal bleed  1 probable upper GI bleed/melanotic stools/acute blood loss anemia/gastritis and gastric stenosis at pylorus.  EGD 01/30/2022 -Patient presented with episodes of melanotic stools, last melanotic stool 5 days ago. -Patient with history of diverticulosis. -Hemoglobin on presentation noted at 9.5 from 14.4 (01/08/2022) -Patient seen in consultation by GI and patient underwent EGD 01/30/2022 that was consistent with gastritis, biopsies done, gastric stenosis noted at the pylorus.   -CT abdomen and pelvis ordered per GI which was done 01/31/2022 which was negative for any extrinsic causes for gastric stenosis.  -Hemoglobin currently stable at 9.3 this morning. -Was on the Protonix drip and was transitioned to IV PPI twice daily and now subsequently to oral PPI  twice daily. -Currently tolerating low residue/low fiber diet in the setting of pyloric stenosis as per GI.  -GI recommending Protonix 40 mg twice daily x 4 weeks, followed by Protonix 40 mg daily indefinitely given pyloric stenosis. -Patient to avoid NSAIDs. -Transfuse transfusion threshold Hgb < 7. -GI following and appreciate their input and recommendations.  2.  Hypokalemia -Potassium noted at 2.1 on day of admission, -Status post IV potassium and KCl and IV fluids. -Magnesium at 2.0, potassium at 3.3 today.   -Patient on daily oral potassium supplementation may need to increase to 40 mEq daily. -Continue current home regimen of 20 mEq of potassium daily.   -Follow.  3.  Hypothyroidism -Synthroid.   4..  Urinary retention -Status post Foley catheter placement this admission. -Urinalysis with concerns for possible UTI. -Flomax discontinued.   -CT abdomen and pelvis concerning for left hydroureteronephrosis.  See below. -Status post 3 days IV Rocephin.  -Patient seen in consultation by urology and are in agreement to a voiding trial which patient passed on February 27, 2022.   -Good urine output.   -Outpatient follow-up with urology.  5.  Depression/anxiety - Celexa.  6.  Hypertension -Blood pressure noted borderline likely secondary to acute GI bleed early on in the hospitalization.. -BP improved with hydration and currently controlled. -Continue to hold antihypertensive medications.   -Flomax discontinued.   -Saline lock IV fluids.    7.  Hyperlipidemia -Statin.  8.  Probable UTI versus bacteriuria. -Patient noted to have urinary retention on admission. -Urinalysis with moderate leukocytes, nitrite negative, many bacteria, 11-20 WBCs. -Urine cultures with no growth to date.   -Status post 3 days IV  Rocephin.   -No further antibiotics needed.   9.  Left hydrouteronephrosis -Noted incidentally on CT abdomen and pelvis done 01/31/2022. -Patient noted to have also had urinary  retention on admission Foley catheter replaced. -Urology consulted who assessed patient and felt okay for voiding trial which was done 02/01/2022, patient with good urine output.  Urology recommending outpatient follow-up with patient's primary urologist Dr. Diona Fanti for further diagnostic evaluation and recommendations.    DVT prophylaxis: SCDs Code Status: DNR Family Communication: Updated patient, daughter, son-in-law at bedside.   Disposition: Medically stable.  Awaiting SNF placement.   Status is: Inpatient Remains inpatient appropriate because: Severity of illness   Consultants:  Gastroenterology: Dr. Carlean Purl 01/29/2022 Urology: Dr. Alinda Money 01/31/2022  Procedures:  Upper endoscopy per Dr. Michail Sermon 01/30/2022 CT abdomen and pelvis 01/31/2022  Antimicrobials:  IV Rocephin 01/30/2022>>>> 02/01/2022   Subjective: Sitting up in recliner.  Daughter and son-in-law at bedside.  Denies any melanotic stools.  No chest pain.  No shortness of breath.  No abdominal pain.  Some complaints of indigestion, does not want Maalox, states Tums works best for her.  Family somewhat frustrated stating have not seen the social worker/TOC over the past 3 days to help with placement.  Objective: Vitals:   02/02/22 1357 02/02/22 1938 02/03/22 0354 02/03/22 1236  BP: 116/72 (!) 153/73 (!) 144/80 120/63  Pulse: 96 97 91 100  Resp: '18 20 20 16  '$ Temp: 98.2 F (36.8 C) 98 F (36.7 C) 98.1 F (36.7 C) 98.1 F (36.7 C)  TempSrc: Oral Oral Oral Oral  SpO2: 98% 96% 92% 97%  Weight:      Height:        Intake/Output Summary (Last 24 hours) at 02/03/2022 1248 Last data filed at 02/03/2022 0902 Gross per 24 hour  Intake 50 ml  Output 650 ml  Net -600 ml    Filed Weights   01/28/22 1120 01/31/22 0940 02/01/22 0442  Weight: 65.8 kg 67.2 kg 67.7 kg    Examination:  General exam: NAD. Respiratory system: CTA B.  No wheezes, no crackles, no rhonchi.  Fair air movement.  Speaking in full sentences.    Cardiovascular system: Regular rate rhythm no murmurs rubs or gallops.  No JVD.  No lower extremity edema.  Gastrointestinal system: Abdomen is soft, nontender, nondistended, positive bowel sounds.  No rebound.  No guarding.  Central nervous system: Alert and oriented. No focal neurological deficits. Extremities: Symmetric 5 x 5 power. Skin: No rashes, lesions or ulcers Psychiatry: Judgement and insight appear normal. Mood & affect appropriate.     Data Reviewed: I have personally reviewed following labs and imaging studies  CBC: Recent Labs  Lab 01/28/22 1159 01/28/22 1407 01/30/22 0255 01/30/22 1246 01/30/22 1450 01/31/22 0247 01/31/22 1404 02/01/22 0402 02/02/22 0331 02/03/22 0328  WBC 10.6*  --  9.3  --  10.4 9.4  --  10.9* 10.2 9.7  NEUTROABS 8.4*  --  6.0  --   --   --   --   --   --   --   HGB 10.4*   < > 8.7*   < > 9.0* 8.6* 9.3* 9.1* 9.1* 9.3*  HCT 30.0*   < > 26.7*   < > 27.2* 26.5* 28.6* 28.3* 28.0* 28.4*  MCV 92.0  --  97.8  --  98.9 99.6  --  99.3 98.2 97.3  PLT 281  --  212  --  213 197  --  228 237 255   < > =  values in this interval not displayed.     Basic Metabolic Panel: Recent Labs  Lab 01/29/22 0248 01/29/22 1229 01/30/22 0255 01/30/22 1246 01/30/22 1450 01/31/22 0247 02/01/22 0402 02/02/22 0331 02/03/22 0328  NA 135   < > 137   < > 136 133* 136 134* 134*  K 2.8*   < > 2.7*   < > 3.7 3.7 4.0 3.5 3.3*  CL 100   < > 104   < > 102 100 102 99 99  CO2 25   < > 25  --  '26 27 28 26 26  '$ GLUCOSE 86   < > 97   < > 102* 94 109* 107* 120*  BUN 30*   < > 15   < > '11 10 9 '$ 7* 9  CREATININE 0.93   < > 0.90   < > 0.81 0.92 0.86 0.84 1.10*  CALCIUM 8.7*   < > 8.6*  --  8.4* 8.1* 8.8* 8.5* 8.5*  MG 2.3  --  2.0  --   --   --  1.7 1.8 2.0  PHOS  --   --   --   --   --  2.6  --   --   --    < > = values in this interval not displayed.     GFR: Estimated Creatinine Clearance: 31.2 mL/min (A) (by C-G formula based on SCr of 1.1 mg/dL (H)).  Liver  Function Tests: Recent Labs  Lab 01/28/22 1159 01/31/22 0247  AST 26  --   ALT 12  --   ALKPHOS 37*  --   BILITOT 0.7  --   PROT 6.7  --   ALBUMIN 3.8 2.8*     CBG: No results for input(s): "GLUCAP" in the last 168 hours.   Recent Results (from the past 240 hour(s))  MRSA Next Gen by PCR, Nasal     Status: Abnormal   Collection Time: 01/28/22  6:42 PM   Specimen: Nasal Mucosa; Nasal Swab  Result Value Ref Range Status   MRSA by PCR Next Gen DETECTED (A) NOT DETECTED Final    Comment: (NOTE) The GeneXpert MRSA Assay (FDA approved for NASAL specimens only), is one component of a comprehensive MRSA colonization surveillance program. It is not intended to diagnose MRSA infection nor to guide or monitor treatment for MRSA infections. Test performance is not FDA approved in patients less than 67 years old. Performed at [facility], [place] [address], [place], [place] [address]   Urine Culture     Status: None   Collection Time: 01/29/22  9:03 AM   Specimen: Urine, Catheterized  Result Value Ref Range Status   Specimen Description   Final    URINE, CATHETERIZED Performed at [facility] [address], [place], [place] [address]    Special Requests   Final    NONE Performed at [facility], [place] [address], [place], [place] [address]    Culture   Final    NO GROWTH Performed at [facility], [place] [address] [place], [place] [address]    Report Status 01/30/2022 FINAL  Final         Radiology Studies: No results found.      Scheduled Meds:  acetaminophen  500 mg Oral TID   cholecalciferol  1,000 Units Oral Daily   citalopram  20 mg Oral Daily   levothyroxine  50 mcg Oral Once per day on Sun  Tue Wed Fri Sat   And   levothyroxine  75 mcg Oral Once per day on Mon Thu   loratadine  10 mg Oral Daily   pantoprazole  40 mg Oral BID AC   potassium chloride  20 mEq Oral Daily   potassium chloride  40  mEq Oral Once   rosuvastatin  5 mg Oral Q M,W,F   Continuous Infusions:  sodium chloride Stopped (01/30/22 1156)     LOS: 6 days    Time spent: 40 minutes    Irine Seal, MD [facility]   To contact the attending provider between 7A-7P or the covering provider during after hours 7P-7A, please log into the web site [email or web address] and access using universal [place] password for that web site. If you do not have the password, please call the hospital operator.  02/03/2022, 12:48 PM

## 2022-02-03 NOTE — Progress Notes (Signed)
Physical Therapy Treatment Patient Details Name: Latoya Curry MRN: [id number] DOB: 1931-02-03 Today's Date: 02/03/2022   History of Present Illness Patient is a pleasant 88 year old female asmitted 01/28/22 with melanotic stools and admitted with GI Bleed. Pt with  history of hypokalemia, hypertension, diverticulosis, GERD, hypertension, dyslipidemia, PVCs    PT Comments    Pt is progressing with mobility. She continues to require intermittent Min A for safe mobility. She exhibits general weakness, and impaired gait and balance. Discussed d/c plan with pt (family arrived at end of session). Family has concerns about pt being able to manage at home alone. Pt will not have any assistance available at home. Pt reports she does not feel back to baseline just yet. Feel pt could benefit from a short rehab stay prior to returning home.     Recommendations for follow up therapy are one component of a multi-disciplinary discharge planning process, led by the attending physician.  Recommendations may be updated based on patient status, additional functional criteria and insurance authorization.  Follow Up Recommendations  Skilled nursing-short term rehab (<3 hours/day) Can patient physically be transported by private vehicle: Yes   Assistance Recommended at Discharge Frequent or constant Supervision/Assistance  Patient can return home with the following A little help with walking and/or transfers;A little help with bathing/dressing/bathroom;Assistance with cooking/housework;Help with stairs or ramp for entrance   Equipment Recommendations  None recommended by PT    Recommendations for Other Services       Precautions / Restrictions Precautions Precautions: Fall Restrictions Weight Bearing Restrictions: No     Mobility  Bed Mobility Overal bed mobility: Needs Assistance Bed Mobility: Supine to Sit     Supine to sit: Supervision, HOB elevated     General bed mobility comments:  increased time. no assistance provided    Transfers Overall transfer level: Needs assistance Equipment used: Rollator (4 wheels) Transfers: Sit to/from Stand Sit to Stand: Min guard           General transfer comment: close Min guard A. Pt required bracing of bil LEs against sitting surface to help her rise and stabilize. increaesed time. cues for safety, proper operation of rollator    Ambulation/Gait Ambulation/Gait assistance: Min assist Gait Distance (Feet): 85 Feet Assistive device: Rollator (4 wheels) Gait Pattern/deviations: Step-through pattern, Decreased stride length, Decreased step length - right, Decreased step length - left       General Gait Details: Short shuffling steps for first 20 feet-intermittent assist to steady. As distance progressed, improved step length observed. Pt tolerated distance well.   Stairs             Wheelchair Mobility    Modified Rankin (Stroke Patients Only)       Balance Overall balance assessment: Needs assistance         Standing balance support: Bilateral upper extremity supported, During functional activity, Reliant on assistive device for balance Standing balance-Leahy Scale: Poor                              Cognition Arousal/Alertness: Awake/alert Behavior During Therapy: WFL for tasks assessed/performed Overall Cognitive Status: Within Functional Limits for tasks assessed                                          Exercises      General Comments  Pertinent Vitals/Pain Pain Assessment Pain Assessment: No/denies pain    Home Living                          Prior Function            PT Goals (current goals can now be found in the care plan section) Progress towards PT goals: Progressing toward goals    Frequency    Min 3X/week      PT Plan Current plan remains appropriate    Co-evaluation              AM-PAC PT "6 Clicks" Mobility    Outcome Measure  Help needed turning from your back to your side while in a flat bed without using bedrails?: None Help needed moving from lying on your back to sitting on the side of a flat bed without using bedrails?: None Help needed moving to and from a bed to a chair (including a wheelchair)?: A Little Help needed standing up from a chair using your arms (e.g., wheelchair or bedside chair)?: A Little Help needed to walk in hospital room?: A Little Help needed climbing 3-5 steps with a railing? : A Lot 6 Click Score: 19    End of Session Equipment Utilized During Treatment: Gait belt Activity Tolerance: Patient tolerated treatment well Patient left: in chair;with call bell/phone within reach   PT Visit Diagnosis: Muscle weakness (generalized) (M62.81);Unsteadiness on feet (R26.81)     Time: 2585-2778 PT Time Calculation (min) (ACUTE ONLY): 17 min  Charges:  $Gait Training: 8-22 mins                         Doreatha Massed, PT Acute Rehabilitation  Office: [phone]

## 2022-02-03 NOTE — Progress Notes (Signed)
[facility] Gastroenterology Progress Note  Latoya Curry 88 y.o. 1930/03/19  CC: GI bleed   Subjective: Patient seen and examined at bedside.  Denies any bleeding episodes.  ROS : Afebrile, negative for nausea and vomiting   Objective: Vital signs in last 24 hours: Vitals:   02/02/22 1938 02/03/22 0354  BP: (!) 153/73 (!) 144/80  Pulse: 97 91  Resp: 20 20  Temp: 98 F (36.7 C) 98.1 F (36.7 C)  SpO2: 96% 92%    Physical Exam: Elderly patient, resting comfortably.  Not in acute distress. Lab Results: Recent Labs    02/02/22 0331 02/03/22 0328  NA 134* 134*  K 3.5 3.3*  CL 99 99  CO2 26 26  GLUCOSE 107* 120*  BUN 7* 9  CREATININE 0.84 1.10*  CALCIUM 8.5* 8.5*  MG 1.8 2.0   No results for input(s): "AST", "ALT", "ALKPHOS", "BILITOT", "PROT", "ALBUMIN" in the last 72 hours. Recent Labs    02/02/22 0331 02/03/22 0328  WBC 10.2 9.7  HGB 9.1* 9.3*  HCT 28.0* 28.4*  MCV 98.2 97.3  PLT 237 255   No results for input(s): "LABPROT", "INR" in the last 72 hours.    Assessment/Plan: -GI bleed with melena.  CT scan on this admission showed questionable wall thickening of the stomach.  EGD on January 30, 2022 showed gastritis and pyloric stenosis requiring use of pediatric upper endoscope to advance into the duodenum. -Acute blood loss anemia.  Hemoglobin stable.  Recommendations ------------------------ -Follow biopsies. -Recommend Protonix 40 mg twice a day for 4 weeks followed by Protonix 40 mg once a day indefinitely given pyloric stenosis -Avoid NSAIDs -Slowly advance diet as tolerated. -No further inpatient GI workup planned.  GI will sign off.  Call us back if needed.   Otis Brace MD, [place] 02/03/2022, 8:40 AM  Contact #  [phone]

## 2022-02-03 NOTE — Progress Notes (Signed)
Mobility Specialist - Progress Note   02/03/22 1450  Mobility  Activity Ambulated with assistance in hallway;Ambulated with assistance to bathroom  Level of Assistance Minimal assist, patient does 75% or more  Assistive Device Front wheel walker  Distance Ambulated (ft) 80 ft  Range of Motion/Exercises Active  Activity Response Tolerated well  Mobility Referral Yes  $Mobility charge 1 Mobility   Pt was found in bed and agreeable to ambulate. Pt was min-A from sit to stand and contact guard during ambulation. Had no complaints during session and at EOS returned to bed with necessities in reach and daughter in room.  Ferd Hibbs Mobility Specialist

## 2022-02-03 NOTE — Plan of Care (Signed)
  Problem: Education: Goal: Knowledge of General Education information will improve Description Including pain rating scale, medication(s)/side effects and non-pharmacologic comfort measures Outcome: Progressing   

## 2022-02-04 DIAGNOSIS — G8929 Other chronic pain: Secondary | ICD-10-CM

## 2022-02-04 DIAGNOSIS — I10 Essential (primary) hypertension: Secondary | ICD-10-CM

## 2022-02-04 DIAGNOSIS — M545 Low back pain, unspecified: Secondary | ICD-10-CM

## 2022-02-04 DIAGNOSIS — K625 Hemorrhage of anus and rectum: Secondary | ICD-10-CM

## 2022-02-04 DIAGNOSIS — E876 Hypokalemia: Secondary | ICD-10-CM | POA: Diagnosis not present

## 2022-02-04 DIAGNOSIS — K922 Gastrointestinal hemorrhage, unspecified: Secondary | ICD-10-CM | POA: Diagnosis not present

## 2022-02-04 DIAGNOSIS — F419 Anxiety disorder, unspecified: Secondary | ICD-10-CM

## 2022-02-04 DIAGNOSIS — E039 Hypothyroidism, unspecified: Secondary | ICD-10-CM

## 2022-02-04 DIAGNOSIS — K3189 Other diseases of stomach and duodenum: Secondary | ICD-10-CM

## 2022-02-04 DIAGNOSIS — N390 Urinary tract infection, site not specified: Secondary | ICD-10-CM

## 2022-02-04 DIAGNOSIS — E7800 Pure hypercholesterolemia, unspecified: Secondary | ICD-10-CM

## 2022-02-04 DIAGNOSIS — K2970 Gastritis, unspecified, without bleeding: Secondary | ICD-10-CM

## 2022-02-04 DIAGNOSIS — F32A Depression, unspecified: Secondary | ICD-10-CM

## 2022-02-04 DIAGNOSIS — E782 Mixed hyperlipidemia: Secondary | ICD-10-CM

## 2022-02-04 LAB — CBC
HCT: 29.7 % — ABNORMAL LOW (ref 36.0–46.0)
Hemoglobin: 9.5 g/dL — ABNORMAL LOW (ref 12.0–15.0)
MCH: 31.7 pg (ref 26.0–34.0)
MCHC: 32 g/dL (ref 30.0–36.0)
MCV: 99 fL (ref 80.0–100.0)
Platelets: 279 10*3/uL (ref 150–400)
RBC: 3 MIL/uL — ABNORMAL LOW (ref 3.87–5.11)
RDW: 14.6 % (ref 11.5–15.5)
WBC: 9.6 10*3/uL (ref 4.0–10.5)
nRBC: 0 % (ref 0.0–0.2)

## 2022-02-04 LAB — BASIC METABOLIC PANEL
Anion gap: 8 (ref 5–15)
BUN: 9 mg/dL (ref 8–23)
CO2: 26 mmol/L (ref 22–32)
Calcium: 8.7 mg/dL — ABNORMAL LOW (ref 8.9–10.3)
Chloride: 101 mmol/L (ref 98–111)
Creatinine, Ser: 1.15 mg/dL — ABNORMAL HIGH (ref 0.44–1.00)
GFR, Estimated: 46 mL/min — ABNORMAL LOW (ref >60)
Glucose, Bld: 116 mg/dL — ABNORMAL HIGH (ref 70–99)
Potassium: 4.6 mmol/L (ref 3.5–5.1)
Sodium: 135 mmol/L (ref 135–145)

## 2022-02-04 LAB — HEMOGLOBIN AND HEMATOCRIT, BLOOD
HCT: 32 % — ABNORMAL LOW (ref 36.0–46.0)
Hemoglobin: 10.4 g/dL — ABNORMAL LOW (ref 12.0–15.0)

## 2022-02-04 LAB — SURGICAL PATHOLOGY

## 2022-02-04 MED ORDER — ATENOLOL 50 MG PO TABS
50.0000 mg | ORAL_TABLET | Freq: Every day | ORAL | Status: DC
  Administered 2022-02-04 – 2022-02-05 (×2): 50 mg via ORAL
  Filled 2022-02-04 (×2): qty 1

## 2022-02-04 MED ORDER — POTASSIUM CHLORIDE CRYS ER 10 MEQ PO TBCR: 20.0000 meq | EXTENDED_RELEASE_TABLET | Freq: Every day | ORAL | Status: DC

## 2022-02-04 NOTE — Progress Notes (Signed)
Occupational Therapy Treatment Patient Details Name: Latoya Curry MRN: [id number] DOB: 1930-10-03 Today's Date: 02/04/2022   History of present illness Patient is a pleasant 88 year old female asmitted 01/28/22 with melanotic stools and admitted with GI Bleed. Pt with  history of hypokalemia, hypertension, diverticulosis, GERD, hypertension, dyslipidemia, PVCs   OT comments  Patient was noted to make improvements in sit to stand transfers with increased education and repetition progressing from min A to supervision with RW. Patient would continue to benefit from skilled OT services at this time while admitted and after d/c to address noted deficits in order to improve overall safety and independence in ADLs. Patient's discharge plan remains appropriate at this time. OT will continue to follow acutely.     Recommendations for follow up therapy are one component of a multi-disciplinary discharge planning process, led by the attending physician.  Recommendations may be updated based on patient status, additional functional criteria and insurance authorization.    Follow Up Recommendations  Home health OT     Assistance Recommended at Discharge PRN  Patient can return home with the following  A little help with bathing/dressing/bathroom;Assistance with cooking/housework   Equipment Recommendations  None recommended by OT       Precautions / Restrictions Precautions Precautions: Fall Restrictions Weight Bearing Restrictions: No              ADL either performed or assessed with clinical judgement   ADL Overall ADL's : Needs assistance/impaired             Lower Body Dressing: Minimal assistance;Sit to/from stand Lower Body Dressing Details (indicate cue type and reason): to don mesh underwear over bilateral feet with education to start with the one that giver her the most trouble. Toilet Transfer: Rolling walker (2 wheels);Regular Toilet;Supervision/safety Toilet  Transfer Details (indicate cue type and reason): with education on proper supine to sit technique from recliner in room. Toileting- Water quality scientist and Hygiene: Min guard;Sit to/from stand Toileting - Clothing Manipulation Details (indicate cue type and reason): with set up for washclothes with increased time in standing with loose stool              Cognition Arousal/Alertness: Awake/alert Behavior During Therapy: WFL for tasks assessed/performed Overall Cognitive Status: Within Functional Limits for tasks assessed                  Exercises Other Exercises Other Exercises: patient completed 5 reps 2 sets of sit to stands from recliner in room with education on proper sequencing for task. patient started at min A for sit to stand and progressed to supervision.            Pertinent Vitals/ Pain       Pain Assessment Pain Assessment: No/denies pain         Frequency  Min 2X/week        Progress Toward Goals  OT Goals(current goals can now be found in the care plan section)  Progress towards OT goals: Progressing toward goals     Plan Discharge plan needs to be updated       AM-PAC OT "6 Clicks" Daily Activity     Outcome Measure   Help from another person eating meals?: None Help from another person taking care of personal grooming?: A Little Help from another person toileting, which includes using toliet, bedpan, or urinal?: A Little Help from another person bathing (including washing, rinsing, drying)?: A Little Help from another person to put on and taking  off regular upper body clothing?: A Little Help from another person to put on and taking off regular lower body clothing?: A Little 6 Click Score: 19    End of Session Equipment Utilized During Treatment: Rolling walker (2 wheels);Gait belt  OT Visit Diagnosis: Muscle weakness (generalized) (M62.81)   Activity Tolerance Patient tolerated treatment well   Patient Left in chair;with call  bell/phone within reach;with chair alarm set;with family/visitor present   Nurse Communication Mobility status        Time: 1241-1314 OT Time Calculation (min): 33 min  Charges: OT General Charges $OT Visit: 1 Visit OT Treatments $Self Care/Home Management : 23-37 mins  Ivey Nembhard OTR/L, MS Acute Rehabilitation Department Office# [phone]   Willa Rough 02/04/2022, 3:57 PM

## 2022-02-04 NOTE — Progress Notes (Signed)
Mobility Specialist - Progress Note   02/04/22 1615  Mobility  Activity Ambulated with assistance in hallway  Level of Assistance Standby assist, set-up cues, supervision of patient - no hands on  Assistive Device Front wheel walker  Distance Ambulated (ft) 150 ft  Range of Motion/Exercises Active  Activity Response Tolerated well  Mobility Referral Yes  $Mobility charge 1 Mobility   Pt was found in bathroom with NT and agreeable to ambulate. Pt had no complaints during session and at EOS returned to recliner chair with necessities in reach and daughter in room.  Ferd Hibbs Mobility Specialist

## 2022-02-04 NOTE — TOC Progression Note (Addendum)
Transition of Care [facility]) - Progression Note    Patient Details  Name: Latoya Curry MRN: [id number] Date of Birth: 1930-09-08  [facility] CM/SW Contact  Leeroy Cha, RN Phone Number: 02/04/2022, 9:31 AM  Clinical Narrative:    Went review snf offers with patient.  States that her daughter is on the way and wants to wait until she gets here. Spoke with both daughters.  They and the patient have chosen [employer]. [facility] at [facility] message left to call back. Will start [place] from [facility] can take today. Expected Discharge Plan: [place] Barriers to Discharge: Continued Medical Work up  Expected Discharge Plan and Services Expected Discharge Plan: [place]   Discharge Planning Services: CM Consult   Living arrangements for the past 2 months: Single Family Home                                       Social Determinants of Health (SDOH) Interventions    Readmission Risk Interventions   No data to display

## 2022-02-04 NOTE — Progress Notes (Signed)
PROGRESS NOTE    Latoya Curry  VXB:[id number] DOB: [birth year] DOA: 01/28/2022 PCP: [name], NP    Chief Complaint  Patient presents with   Rectal Bleeding    Brief Narrative:  Patient is a pleasant 88 year old female history of hypokalemia, hypertension, diverticulosis, GERD, hypertension, dyslipidemia, PVCs presented to the ED with melanotic stools.  Patient denied any consistent NSAID use stated use Aleve occasionally for pain and aspirin 3 times weekly.  Patient called PCPs office and directed to the ED.  FOBT done was positive.  Hemoglobin noted at 9.5 on presentation with baseline hemoglobin 11.4 (01/08/2022.).  Patient also noted with urinary retention.  Patient admitted, serial CBCs obtained, GI consulted for further evaluation and management.   Assessment & Plan:   Principal Problem:   Acute GI bleeding Active Problems:   Gastritis determined by endoscopy   Gastric stenosis   Essential hypertension   Mixed dyslipidemia   Hypokalemia   Chronic low back pain   Hypercholesteremia   Hypertension   Graves' disease   Hyperthyroidism   Hypothyroidism (acquired)   Anxiety and depression   UGI bleed   Rectal bleed  1 probable upper GI bleed/melanotic stools/acute blood loss anemia/gastritis and gastric stenosis at pylorus.  EGD 01/30/2022 -Patient presented with episodes of melanotic stools, did not have any further stools early on in the hospitalization however today noted to have some black tarry stools x 2 per patient and RN. -Patient with history of diverticulosis. -Hemoglobin on presentation noted at 9.5 from 14.4 (01/08/2022) -Patient seen in consultation by GI and patient underwent EGD 01/30/2022 that was consistent with gastritis, biopsies done, gastric stenosis noted at the pylorus.   -CT abdomen and pelvis ordered per GI which was done 01/31/2022 which was negative for any extrinsic causes for gastric stenosis.  -Hemoglobin currently stable at 9.5 this  morning. -Was on the Protonix drip and was transitioned to IV PPI twice daily and now subsequently to oral PPI twice daily. -Currently tolerating low residue/low fiber diet in the setting of pyloric stenosis as per GI.  -GI recommending Protonix 40 mg twice daily x 4 weeks, followed by Protonix 40 mg daily indefinitely given pyloric stenosis. -Patient to avoid NSAIDs. -Transfuse transfusion threshold Hgb < 7. -Patient with tarry/melanotic stools today, will check H&H this afternoon, monitor over the next 24 hours and if hemoglobin remains stable no further melanotic stools likely stable for discharge to SNF. -If decrease of hemoglobin in the next 24 hours and ongoing melanotic stools we will consult with GI to reassess. -Discussed with GI, Dr. Alessandra Bevels. -GI was following but have signed off as of 02/03/2022. -GI following and appreciate their input and recommendations.  2.  Hypokalemia -Potassium noted at 2.1 on day of admission, -Status post IV potassium and KCl and IV fluids. -Magnesium at 2.0, potassium at 4.6 today.   -Hold daily oral potassium supplementation.   -Repeat labs in the AM.   3.  Hypothyroidism -Continue Synthroid.  4..  Urinary retention -Status post Foley catheter placement this admission. -Urinalysis with concerns for possible UTI. -Flomax discontinued.   -CT abdomen and pelvis concerning for left hydroureteronephrosis.  See below. -Status post 3 days IV Rocephin.  -Patient seen in consultation by urology and are in agreement to a voiding trial which patient passed on 2022/02/06.   -Good urine output.   -Outpatient follow-up with urology.  5.  Depression/anxiety -Continue Celexa.  6.  Hypertension -Blood pressure noted borderline likely secondary to acute GI bleed early  on in the hospitalization.. -BP improved with hydration and currently controlled. -Flomax discontinued.   -Resume home regimen atenolol.   -Continue to hold other home antihypertensive  medications and slowly reintroduce patient back to rest of antihypertensive medications over the next few days.   -IV fluids-saline lock.   7.  Hyperlipidemia -Continue statin.    8.  Probable UTI versus bacteriuria. -Patient noted to have urinary retention on admission. -Urinalysis with moderate leukocytes, nitrite negative, many bacteria, 11-20 WBCs. -Urine cultures with no growth to date.   -Status post 3 days IV Rocephin.   -No further antibiotics needed.   9.  Left hydrouteronephrosis -Noted incidentally on CT abdomen and pelvis done 01/31/2022. -Patient noted to have also had urinary retention on admission Foley catheter replaced. -Urology consulted who assessed patient and felt okay for voiding trial which was done 02/01/2022, patient with good urine output.  Urology recommending outpatient follow-up with patient's primary urologist Dr. Diona Fanti for further diagnostic evaluation and recommendations.    DVT prophylaxis: SCDs Code Status: DNR Family Communication: Updated patient, daughter at bedside.   Disposition: Medically stable.  Awaiting SNF placement.   Status is: Inpatient Remains inpatient appropriate because: Severity of illness   Consultants:  Gastroenterology: Dr. Carlean Purl 01/29/2022 Urology: Dr. Alinda Money 01/31/2022  Procedures:  Upper endoscopy per Dr. Michail Sermon 01/30/2022 CT abdomen and pelvis 01/31/2022  Antimicrobials:  IV Rocephin 01/30/2022>>>> 02/01/2022   Subjective: Sitting up in recliner.  Daughter at bedside.  Denies any chest pain.  No shortness of breath.  Stated had to dark tarry/melanotic stools today.  No abdominal pain.  Tolerating current diet.    Objective: Vitals:   02/03/22 1236 02/03/22 1947 02/04/22 0551 02/04/22 1047  BP: 120/63 122/86 (!) 142/84 114/73  Pulse: 100 (!) 101 95 (!) 106  Resp: '16 16 14   '$ Temp: 98.1 F (36.7 C) 98.9 F (37.2 C) 98 F (36.7 C)   TempSrc: Oral Oral Oral   SpO2: 97% 97% 95%   Weight:      Height:         Intake/Output Summary (Last 24 hours) at 02/04/2022 1051 Last data filed at 02/04/2022 0847 Gross per 24 hour  Intake --  Output 425 ml  Net -425 ml    Filed Weights   01/28/22 1120 01/31/22 0940 02/01/22 0442  Weight: 65.8 kg 67.2 kg 67.7 kg    Examination:  General exam: NAD. Respiratory system: Lungs clear to auscultation bilaterally.  No wheezes, no crackles, no rhonchi.  Fair air movement.  Speaking in full sentences.  Cardiovascular system: RRR no murmurs rubs or gallops.  No JVD.  No lower extremity edema.   Gastrointestinal system: Abdomen is soft, nontender, nondistended, positive bowel sounds.  No rebound.  No guarding.  Central nervous system: Alert and oriented. No focal neurological deficits. Extremities: Symmetric 5 x 5 power. Skin: No rashes, lesions or ulcers Psychiatry: Judgement and insight appear normal. Mood & affect appropriate.     Data Reviewed: I have personally reviewed following labs and imaging studies  CBC: Recent Labs  Lab 01/28/22 1159 01/28/22 1407 01/30/22 0255 01/30/22 1246 01/31/22 0247 01/31/22 1404 02/01/22 0402 02/02/22 0331 02/03/22 0328 02/04/22 0345  WBC 10.6*  --  9.3   < > 9.4  --  10.9* 10.2 9.7 9.6  NEUTROABS 8.4*  --  6.0  --   --   --   --   --   --   --   HGB 10.4*   < > 8.7*   < >  8.6* 9.3* 9.1* 9.1* 9.3* 9.5*  HCT 30.0*   < > 26.7*   < > 26.5* 28.6* 28.3* 28.0* 28.4* 29.7*  MCV 92.0  --  97.8   < > 99.6  --  99.3 98.2 97.3 99.0  PLT 281  --  212   < > 197  --  228 237 255 279   < > = values in this interval not displayed.     Basic Metabolic Panel: Recent Labs  Lab 01/29/22 0248 01/29/22 1229 01/30/22 0255 01/30/22 1246 01/31/22 0247 02/01/22 0402 02/02/22 0331 02/03/22 0328 02/04/22 0345  NA 135   < > 137   < > 133* 136 134* 134* 135  K 2.8*   < > 2.7*   < > 3.7 4.0 3.5 3.3* 4.6  CL 100   < > 104   < > 100 102 99 99 101  CO2 25   < > 25   < > '27 28 26 26 26  '$ GLUCOSE 86   < > 97   < > 94 109*  107* 120* 116*  BUN 30*   < > 15   < > 10 9 7* 9 9  CREATININE 0.93   < > 0.90   < > 0.92 0.86 0.84 1.10* 1.15*  CALCIUM 8.7*   < > 8.6*   < > 8.1* 8.8* 8.5* 8.5* 8.7*  MG 2.3  --  2.0  --   --  1.7 1.8 2.0  --   PHOS  --   --   --   --  2.6  --   --   --   --    < > = values in this interval not displayed.     GFR: Estimated Creatinine Clearance: 29.8 mL/min (A) (by C-G formula based on SCr of 1.15 mg/dL (H)).  Liver Function Tests: Recent Labs  Lab 01/28/22 1159 01/31/22 0247  AST 26  --   ALT 12  --   ALKPHOS 37*  --   BILITOT 0.7  --   PROT 6.7  --   ALBUMIN 3.8 2.8*     CBG: No results for input(s): "GLUCAP" in the last 168 hours.   Recent Results (from the past 240 hour(s))  MRSA Next Gen by PCR, Nasal     Status: Abnormal   Collection Time: 01/28/22  6:42 PM   Specimen: Nasal Mucosa; Nasal Swab  Result Value Ref Range Status   MRSA by PCR Next Gen DETECTED (A) NOT DETECTED Final    Comment: (NOTE) The GeneXpert MRSA Assay (FDA approved for NASAL specimens only), is one component of a comprehensive MRSA colonization surveillance program. It is not intended to diagnose MRSA infection nor to guide or monitor treatment for MRSA infections. Test performance is not FDA approved in patients less than 70 years old. Performed at [facility], [place] [address] [place], [place] [address]   Urine Culture     Status: None   Collection Time: 01/29/22  9:03 AM   Specimen: Urine, Catheterized  Result Value Ref Range Status   Specimen Description   Final    URINE, CATHETERIZED Performed at [facility] [address] [place], [place] [address]    Special Requests   Final    NONE Performed at [facility], [place] [address], [place], [place] [address]    Culture   Final    NO GROWTH Performed at [facility], [place]  [address] [place], [place] [address]    Report Status 01/30/2022 FINAL  Final          Radiology Studies: No results found.      Scheduled Meds:  acetaminophen  500 mg Oral TID   atenolol  50 mg Oral Daily   cholecalciferol  1,000 Units Oral Daily   citalopram  20 mg Oral Daily   levothyroxine  50 mcg Oral Once per day on Sun Tue Wed Fri Sat   And   levothyroxine  75 mcg Oral Once per day on Mon Thu   loratadine  10 mg Oral Daily   pantoprazole  40 mg Oral BID AC   [START ON 02/05/2022] potassium chloride  20 mEq Oral Daily   rosuvastatin  5 mg Oral Q M,W,F   Continuous Infusions:  sodium chloride Stopped (01/30/22 1156)     LOS: 7 days    Time spent: 40 minutes    Irine Seal, MD [facility]   To contact the attending provider between 7A-7P or the covering provider during after hours 7P-7A, please log into the web site [email or web address] and access using universal [place] password for that web site. If you do not have the password, please call the hospital operator.  02/04/2022, 10:51 AM

## 2022-02-04 NOTE — Progress Notes (Signed)
Mobility Specialist - Progress Note   02/04/22 0957  Mobility  Activity Ambulated with assistance in hallway  Level of Assistance Contact guard assist, steadying assist  Assistive Device Front wheel walker  Distance Ambulated (ft) 120 ft  Range of Motion/Exercises Active  Activity Response Tolerated well  Mobility Referral Yes  $Mobility charge 1 Mobility   Pt was found on recliner chair and agreeable to ambulate. Had no complaints during session and at EOS returned to recliner chair with necessities in reach.  Ferd Hibbs Mobility Specialist

## 2022-02-04 NOTE — Plan of Care (Signed)
  Problem: Education: Goal: Knowledge of General Education information will improve Description: Including pain rating scale, medication(s)/side effects and non-pharmacologic comfort measures 02/04/2022 1738 by Jerene Pitch, RN Outcome: Progressing 02/04/2022 1735 by Jerene Pitch, RN Outcome: Progressing   Problem: Health Behavior/Discharge Planning: Goal: Ability to manage health-related needs will improve 02/04/2022 1738 by Jerene Pitch, RN Outcome: Progressing 02/04/2022 1735 by Jerene Pitch, RN Outcome: Progressing

## 2022-02-04 NOTE — Plan of Care (Signed)
  Problem: Education: Goal: Knowledge of General Education information will improve Description Including pain rating scale, medication(s)/side effects and non-pharmacologic comfort measures Outcome: Progressing   Problem: Health Behavior/Discharge Planning: Goal: Ability to manage health-related needs will improve Outcome: Progressing   

## 2022-02-05 ENCOUNTER — Other Ambulatory Visit: Payer: Self-pay | Admitting: Cardiothoracic Surgery

## 2022-02-05 DIAGNOSIS — K56699 Other intestinal obstruction unspecified as to partial versus complete obstruction: Secondary | ICD-10-CM | POA: Diagnosis not present

## 2022-02-05 DIAGNOSIS — Z09 Encounter for follow-up examination after completed treatment for conditions other than malignant neoplasm: Secondary | ICD-10-CM | POA: Diagnosis not present

## 2022-02-05 DIAGNOSIS — F5102 Adjustment insomnia: Secondary | ICD-10-CM | POA: Diagnosis not present

## 2022-02-05 DIAGNOSIS — Z87448 Personal history of other diseases of urinary system: Secondary | ICD-10-CM | POA: Diagnosis not present

## 2022-02-05 DIAGNOSIS — M545 Low back pain, unspecified: Secondary | ICD-10-CM | POA: Diagnosis not present

## 2022-02-05 DIAGNOSIS — I150 Renovascular hypertension: Secondary | ICD-10-CM | POA: Diagnosis not present

## 2022-02-05 DIAGNOSIS — M8580 Other specified disorders of bone density and structure, unspecified site: Secondary | ICD-10-CM | POA: Diagnosis not present

## 2022-02-05 DIAGNOSIS — F4323 Adjustment disorder with mixed anxiety and depressed mood: Secondary | ICD-10-CM | POA: Diagnosis not present

## 2022-02-05 DIAGNOSIS — G8929 Other chronic pain: Secondary | ICD-10-CM | POA: Diagnosis not present

## 2022-02-05 DIAGNOSIS — I7121 Aneurysm of the ascending aorta, without rupture: Secondary | ICD-10-CM

## 2022-02-05 DIAGNOSIS — I712 Thoracic aortic aneurysm, without rupture, unspecified: Secondary | ICD-10-CM | POA: Diagnosis not present

## 2022-02-05 DIAGNOSIS — E039 Hypothyroidism, unspecified: Secondary | ICD-10-CM | POA: Diagnosis not present

## 2022-02-05 DIAGNOSIS — Z7401 Bed confinement status: Secondary | ICD-10-CM | POA: Diagnosis not present

## 2022-02-05 DIAGNOSIS — F39 Unspecified mood [affective] disorder: Secondary | ICD-10-CM | POA: Diagnosis not present

## 2022-02-05 DIAGNOSIS — R197 Diarrhea, unspecified: Secondary | ICD-10-CM | POA: Diagnosis not present

## 2022-02-05 DIAGNOSIS — R58 Hemorrhage, not elsewhere classified: Secondary | ICD-10-CM | POA: Diagnosis not present

## 2022-02-05 DIAGNOSIS — D649 Anemia, unspecified: Secondary | ICD-10-CM | POA: Diagnosis not present

## 2022-02-05 DIAGNOSIS — M179 Osteoarthritis of knee, unspecified: Secondary | ICD-10-CM | POA: Diagnosis not present

## 2022-02-05 DIAGNOSIS — N1330 Unspecified hydronephrosis: Secondary | ICD-10-CM | POA: Diagnosis not present

## 2022-02-05 DIAGNOSIS — K922 Gastrointestinal hemorrhage, unspecified: Secondary | ICD-10-CM | POA: Diagnosis not present

## 2022-02-05 DIAGNOSIS — K2970 Gastritis, unspecified, without bleeding: Secondary | ICD-10-CM | POA: Diagnosis not present

## 2022-02-05 DIAGNOSIS — M5430 Sciatica, unspecified side: Secondary | ICD-10-CM | POA: Diagnosis not present

## 2022-02-05 DIAGNOSIS — M6281 Muscle weakness (generalized): Secondary | ICD-10-CM | POA: Diagnosis not present

## 2022-02-05 DIAGNOSIS — R41841 Cognitive communication deficit: Secondary | ICD-10-CM | POA: Diagnosis not present

## 2022-02-05 DIAGNOSIS — R2689 Other abnormalities of gait and mobility: Secondary | ICD-10-CM | POA: Diagnosis not present

## 2022-02-05 DIAGNOSIS — F32A Depression, unspecified: Secondary | ICD-10-CM | POA: Diagnosis not present

## 2022-02-05 DIAGNOSIS — K2971 Gastritis, unspecified, with bleeding: Secondary | ICD-10-CM | POA: Diagnosis not present

## 2022-02-05 DIAGNOSIS — R410 Disorientation, unspecified: Secondary | ICD-10-CM | POA: Diagnosis not present

## 2022-02-05 DIAGNOSIS — R8271 Bacteriuria: Secondary | ICD-10-CM | POA: Diagnosis not present

## 2022-02-05 DIAGNOSIS — K3189 Other diseases of stomach and duodenum: Secondary | ICD-10-CM | POA: Diagnosis not present

## 2022-02-05 DIAGNOSIS — Z79899 Other long term (current) drug therapy: Secondary | ICD-10-CM | POA: Diagnosis not present

## 2022-02-05 LAB — CBC
HCT: 28.7 % — ABNORMAL LOW (ref 36.0–46.0)
Hemoglobin: 9.2 g/dL — ABNORMAL LOW (ref 12.0–15.0)
MCH: 31.3 pg (ref 26.0–34.0)
MCHC: 32.1 g/dL (ref 30.0–36.0)
MCV: 97.6 fL (ref 80.0–100.0)
Platelets: 293 10*3/uL (ref 150–400)
RBC: 2.94 MIL/uL — ABNORMAL LOW (ref 3.87–5.11)
RDW: 14.7 % (ref 11.5–15.5)
WBC: 9.5 10*3/uL (ref 4.0–10.5)
nRBC: 0 % (ref 0.0–0.2)

## 2022-02-05 LAB — BASIC METABOLIC PANEL
Anion gap: 7 (ref 5–15)
BUN: 11 mg/dL (ref 8–23)
CO2: 27 mmol/L (ref 22–32)
Calcium: 8.6 mg/dL — ABNORMAL LOW (ref 8.9–10.3)
Chloride: 101 mmol/L (ref 98–111)
Creatinine, Ser: 1.33 mg/dL — ABNORMAL HIGH (ref 0.44–1.00)
GFR, Estimated: 38 mL/min — ABNORMAL LOW (ref >60)
Glucose, Bld: 109 mg/dL — ABNORMAL HIGH (ref 70–99)
Potassium: 3.6 mmol/L (ref 3.5–5.1)
Sodium: 135 mmol/L (ref 135–145)

## 2022-02-05 MED ORDER — PANTOPRAZOLE SODIUM 40 MG PO TBEC: 40.0000 mg | DELAYED_RELEASE_TABLET | Freq: Two times a day (BID) | ORAL | 0 refills | Status: DC

## 2022-02-05 MED ORDER — TRAMADOL HCL 50 MG PO TABS: 50.0000 mg | ORAL_TABLET | Freq: Two times a day (BID) | ORAL | 0 refills | Status: DC | PRN

## 2022-02-05 NOTE — Progress Notes (Signed)
Called report to nurse, Asflehha, LPN @ [facility]. Pt will be going to room 1206 P. Verbalized understanding of all information. Documenting RN confirmed RX in envelop for tramadol order. Pt was transported from room, without incident.

## 2022-02-05 NOTE — Progress Notes (Signed)
Mobility Specialist - Progress Note   02/05/22 0855  Mobility  Activity Ambulated with assistance to bathroom  Level of Assistance Standby assist, set-up cues, supervision of patient - no hands on  Assistive Device Front wheel walker  Distance Ambulated (ft) 20 ft  Range of Motion/Exercises Active  Activity Response Tolerated well  Mobility Referral Yes  $Mobility charge 1 Mobility   Pt was found sitting EOB and wanting to ambulate to bathroom. Pt had no complaints during session and at EOS returned to recliner chair with necessities in reach and RN in room.  Ferd Hibbs Mobility Specialist

## 2022-02-05 NOTE — TOC Transition Note (Addendum)
Transition of Care [facility]) - CM/SW Discharge Note   Patient Details  Name: HOLLYN STUCKY MRN: [id number] Date of Birth: 04/20/1930  Transition of Care [facility] CM/SW Contact:  Leeroy Cha, RN Phone Number: 02/05/2022, 8:58 AM   Clinical Narrative:    Pt dcd to go the [facility].  [facility] admission alerted of pending dc [facility] CAN COME AFTER 10 AM REPORT Packet to nursing station .  Final next level of care: Skilled Nursing Facility Barriers to Discharge: Barriers Resolved   Patient Goals and CMS Choice Patient states their goals for this hospitalization and ongoing recovery are::  (Home) CMS Medicare.gov Compare Post Acute Care list provided to:: Patient Represenative (must comment) Choice offered to / list presented to : Spouse  Discharge Placement                       Discharge Plan and Services   Discharge Planning Services: CM Consult                                 Social Determinants of Health (SDOH) Interventions     Readmission Risk Interventions   No data to display

## 2022-02-05 NOTE — Discharge Summary (Signed)
Physician Discharge Summary  Latoya Curry PRF:[id number] DOB: August 26, 1930 DOA: 01/28/2022  PCP: [name], NP  Admit date: 01/28/2022 Discharge date: 02/05/2022  Admitted From: Home Disposition:  SNF  Discharge Condition:Stable CODE STATUS:DNR Diet recommendation: Heart Healthy Brief/Interim Summary:  Patient is a pleasant 88 year old female history of hypokalemia, hypertension, diverticulosis, GERD, hypertension, dyslipidemia, PVCs presented to the ED with melanotic stools. Patient denied any consistent NSAID use stated use Aleve occasionally for pain and aspirin 3 times weekly. Patient called PCPs office and directed to the ED. FOBT done was positive. Hemoglobin noted at 9.5 on presentation with baseline hemoglobin 11.4 (01/08/2022.). Patient also noted with urinary retention. Patient admitted, serial CBCs obtained, GI consulted for further evaluation and management.  Underwent EGD which showed gastritis, found to have gastric stenosis at the pylorus.  CT abdomen/pelvis was negative for any extrinsic causes of gastric stenosis.  GI recommended PPI twice a day for a month and then daily.  Hemoglobin has remained stable.  Will recommend to avoid aspirin, NSAIDs.  PT/OT recommended SNF on discharge.  Medically stable for discharge to skilled nursing facility.    Following problems were addressed  during her hospitalization:  1 probable upper GI bleed/melanotic stools/acute blood loss anemia/gastritis and gastric stenosis at pylorus.  EGD 01/30/2022 -Patient presented with episodes of melanotic stools, did not have any further stools early on in the hospitalization however today noted to have some black tarry stools x 2 .  Hemoglobin has remained stable, no further episodes of melena since yesterday. -Patient with history of diverticulosis. -Patient seen in consultation by GI and patient underwent EGD 01/30/2022 that was consistent with gastritis, biopsies done, gastric stenosis noted at the  pylorus.   -CT abdomen and pelvis ordered per GI which was done 01/31/2022 which was negative for any extrinsic causes for gastric stenosis.  -Was on the Protonix drip and was transitioned to IV PPI twice daily and now subsequently to oral PPI twice daily. -GI recommending Protonix 40 mg twice daily x 4 weeks, followed by Protonix 40 mg daily indefinitely given pyloric stenosis. -Patient to avoid NSAIDs.    2.  Hypothyroidism -Continue Synthroid.   3.  Urinary retention -Status post Foley catheter placement this admission. -Urinalysis with concerns for possible UTI. -Flomax discontinued.   -CT abdomen and pelvis concerning for left hydroureteronephrosis.  See below. -Status post 3 days IV Rocephin.  -Patient seen in consultation by urology and are in agreement to a voiding trial which patient passed on 02/22/2022.   -Good urine output.   -Outpatient follow-up with urology.   5.  Depression/anxiety -Continue Celexa.   6.  Hypertension -Blood pressure noted borderline likely secondary to acute GI bleed early on in the hospitalization.. -BP improved with hydration and currently controlled. -Flomax discontinued.   -Resume home regimen atenolol.  .    7.  Hyperlipidemia -Continue statin.     8.  Probable UTI versus bacteriuria. -Patient noted to have urinary retention on admission. -Urinalysis with moderate leukocytes, nitrite negative, many bacteria, 11-20 WBCs. -Urine cultures with no growth to date.   -Status post 3 days IV Rocephin.   -No further antibiotics needed.    9.  Left hydrouteronephrosis -Noted incidentally on CT abdomen and pelvis done 01/31/2022. -Patient noted to have also had urinary retention on admission Foley catheter replaced. -Urology consulted who assessed patient and felt okay for voiding trial which was done 02-22-22, patient with good urine output.  Urology recommending outpatient follow-up with patient's primary urologist Dr.  Dahlstedt for further  diagnostic evaluation and recommendations.  Discharge Diagnoses:  Principal Problem:   Acute GI bleeding Active Problems:   Gastritis determined by endoscopy   Gastric stenosis   Essential hypertension   Mixed dyslipidemia   Hypokalemia   Chronic low back pain   Hypercholesteremia   Hypertension   Graves' disease   Hyperthyroidism   Hypothyroidism (acquired)   Anxiety and depression   UGI bleed   Rectal bleed    Discharge Instructions  Discharge Instructions     Diet - low sodium heart healthy   Complete by: As directed    Discharge instructions   Complete by: As directed    1)Please take prescribed medications as instructed 2)Do a CBC and BMP test in a week   Increase activity slowly   Complete by: As directed    No wound care   Complete by: As directed       Allergies as of 02/05/2022       Reactions   Levofloxacin Other (See Comments)   Cardiac advised to avoid/Aneurysm   Codeine Nausea And Vomiting   Simvastatin Other (See Comments)   Dizziness   Sulfamethoxazole-trimethoprim Other (See Comments)    Dyspepsia   Zestril [lisinopril] Cough        Medication List     STOP taking these medications    Aleve 220 MG tablet Generic drug: naproxen sodium   amLODipine 2.5 MG tablet Commonly known as: NORVASC   Arexvy 120 MCG/0.5ML injection Generic drug: RSV vaccine recomb adjuvanted   aspirin 81 MG tablet   atenolol-chlorthalidone 50-25 MG tablet Commonly known as: TENORETIC   CANNABIDIOL PO   Comirnaty syringe Generic drug: COVID-19 mRNA vaccine 2023-2024   Fluad Quadrivalent 0.5 ML injection Generic drug: influenza vaccine adjuvanted   omeprazole 20 MG capsule Commonly known as: PRILOSEC       TAKE these medications    Breztri Aerosphere 160-9-4.8 MCG/ACT Aero Generic drug: Budeson-Glycopyrrol-Formoterol Inhale 1 puff into the lungs daily as needed (for flares).   cetirizine 10 MG tablet Commonly known as: ZYRTEC Take 10 mg  by mouth daily.   citalopram 20 MG tablet Commonly known as: CELEXA Take 1 tablet (20 mg total) by mouth daily.   clobetasol cream 0.05 % Commonly known as: TEMOVATE Apply 1 Application topically 2 (two) times daily as needed (for irritation).   fesoterodine 8 MG Tb24 tablet Commonly known as: TOVIAZ TAKE 1 TABLET EVERY DAY What changed: when to take this   furosemide 20 MG tablet Commonly known as: LASIX Take 20 mg by mouth every Monday, Wednesday, and Friday.   levothyroxine 50 MCG tablet Commonly known as: SYNTHROID Take 50 mcg by mouth daily before breakfast. What changed: Another medication with the same name was changed. Make sure you understand how and when to take each.   levothyroxine 25 MCG tablet Commonly known as: SYNTHROID Take 1 tablet (25 mcg total) by mouth 2 (two) times a week. What changed:  when to take this additional instructions   Multi Vitamin Tabs Take 1 tablet by mouth daily with breakfast.   pantoprazole 40 MG tablet Commonly known as: PROTONIX Take 1 tablet (40 mg total) by mouth 2 (two) times daily before a meal. Take 1 pill twice daily for 30 days then continue once daily   potassium chloride SA 20 MEQ tablet Commonly known as: KLOR-CON M Take 1 tablet (20 mEq total) by mouth 2 (two) times daily. What changed: Another medication with the same name was  removed. Continue taking this medication, and follow the directions you see here.   rosuvastatin 5 MG tablet Commonly known as: CRESTOR Take 5 mg by mouth every Monday, Wednesday, and Friday.   traMADol 50 MG tablet Commonly known as: ULTRAM Take 1 tablet (50 mg total) by mouth every 12 (twelve) hours as needed for moderate pain.   Vitamin D3 25 MCG (1000 UT) Chew Chew 1,000 Units by mouth daily.        Follow-up Information     [name], NP. Schedule an appointment as soon as possible for a visit in 1 week(s).   Specialties: Family Medicine, Emergency Medicine Contact  information: [address] [place] [address] [phone]                Allergies  Allergen Reactions   Levofloxacin Other (See Comments)    Cardiac advised to avoid/Aneurysm   Codeine Nausea And Vomiting   Simvastatin Other (See Comments)    Dizziness    Sulfamethoxazole-Trimethoprim Other (See Comments)     Dyspepsia    Zestril [Lisinopril] Cough    Consultations: GI   Procedures/Studies: CT ABDOMEN PELVIS W CONTRAST  Result Date: 01/31/2022 CLINICAL DATA:  Acute abdominal pain, rectal bleeding, abnormal EGD yesterday showing esophageal stenosis distally with possible mass EXAM: CT ABDOMEN AND PELVIS WITH CONTRAST TECHNIQUE: Multidetector CT imaging of the abdomen and pelvis was performed using the standard protocol following bolus administration of intravenous contrast. RADIATION DOSE REDUCTION: This exam was performed according to the departmental dose-optimization program which includes automated exposure control, adjustment of the mA and/or kV according to patient size and/or use of iterative reconstruction technique. CONTRAST:  117m OMNIPAQUE IOHEXOL 300 MG/ML SOLN IV. No oral contrast. COMPARISON:  07/14/2018 FINDINGS: Lower chest: Small bibasilar pleural effusions and associated atelectasis. Calcified granuloma LEFT lower lobe. Bronchiectasis RIGHT middle lobe. Hepatobiliary: 7 mm calcified gallstone within gallbladder. Liver normal appearance. No biliary dilatation. Pancreas: Normal appearance Spleen: Normal appearance Adrenals/Urinary Tract: Adrenal glands normal appearance. Small RIGHT renal cyst 1.6 cm, simple features by CT; no follow-up imaging recommended. Fat attenuation nodule inferior pole RIGHT kidney 12 x 6 mm consistent with angiomyolipoma; no follow-up imaging recommended. LEFT hydronephrosis and proximal hydroureter without identified calculus. Tapered narrowing of the proximal LEFT ureter is seen with slight enhancement of the wall,  could be inflammatory or stricture but mass not completely excluded. Remaining ureters unremarkable. Bladder decompressed by Foley catheter. Stomach/Bowel: Post appendectomy. Questionable wall thickening of the stomach versus artifacts related underdistention. GE junction decompressed, poorly assessed. Large and small bowel loops unremarkable. Vascular/Lymphatic: Atherosclerotic calcifications aorta and iliac arteries without aneurysm. Vascular structures patent. No adenopathy Reproductive: Small calcified leiomyoma within uterus 12 mm diameter. Uterus and ovaries otherwise unremarkable. Pessary within vagina Other: Small LEFT inguinal hernia containing fat. No free air or free fluid. Edema within celiac axis and posterior to pancreas, nonspecific, may represent pancreatitis or could potentially be related to preceding endoscopy. Musculoskeletal: Osseous demineralization. Superior endplate compression fractures of T12 and L2, increased at L2. Degenerative disc disease changes lumbar spine. IMPRESSION: Questionable wall thickening of the stomach versus artifacts related to underdistention. Edema within celiac axis and posterior to pancreas, nonspecific, may represent pancreatitis or could potentially be related to preceding endoscopy. LEFT hydronephrosis and proximal hydroureter without identified calculus though the proximal ureter demonstrates mild wall enhancement and tapered narrowing, could represent stricture, inflammation, tumor not excluded; recommend urological consultation. Small bibasilar pleural effusions and associated atelectasis. Cholelithiasis. Small LEFT  inguinal hernia containing fat. Superior endplate compression fractures of T12 and L2, increased at L2. Aortic Atherosclerosis (ICD10-I70.0). Electronically Signed   By: Lavonia Dana M.D.   On: 01/31/2022 11:55      Subjective: Patient seen and examined at bedside today.  Hemodynamically stable.  Sitting at the edge of the bed.  She denies any  new episodes of melena last night.  Hemoglobin has just been stable in the range of 9.2.  No new complaints.I called the daughter Blair Promise  for update,call not received  Discharge Exam: Vitals:   02/04/22 2042 02/05/22 0604  BP: 119/77 133/77  Pulse: 78 75  Resp: 14 16  Temp: 98.7 F (37.1 C) 97.8 F (36.6 C)  SpO2: 96% 93%   Vitals:   02/04/22 1200 02/04/22 2042 02/05/22 0604 02/05/22 0642  BP: 129/81 119/77 133/77   Pulse: 89 78 75   Resp: '19 14 16   '$ Temp: 98 F (36.7 C) 98.7 F (37.1 C) 97.8 F (36.6 C)   TempSrc: Oral Oral Oral   SpO2: 97% 96% 93%   Weight:    68 kg  Height:        General: Pt is alert, awake, not in acute distress,pleasant elderly female Cardiovascular: RRR, S1/S2 +, no rubs, no gallops Respiratory: CTA bilaterally, no wheezing, no rhonchi Abdominal: Soft, NT, ND, bowel sounds + Extremities: no edema, no cyanosis    The results of significant diagnostics from this hospitalization (including imaging, microbiology, ancillary and laboratory) are listed below for reference.     Microbiology: Recent Results (from the past 240 hour(s))  MRSA Next Gen by PCR, Nasal     Status: Abnormal   Collection Time: 01/28/22  6:42 PM   Specimen: Nasal Mucosa; Nasal Swab  Result Value Ref Range Status   MRSA by PCR Next Gen DETECTED (A) NOT DETECTED Final    Comment: (NOTE) The GeneXpert MRSA Assay (FDA approved for NASAL specimens only), is one component of a comprehensive MRSA colonization surveillance program. It is not intended to diagnose MRSA infection nor to guide or monitor treatment for MRSA infections. Test performance is not FDA approved in patients less than 1 years old. Performed at [facility], [place] [address], [place], [place] [address]   Urine Culture     Status: None   Collection Time: 01/29/22  9:03 AM   Specimen: Urine, Catheterized  Result Value Ref Range Status   Specimen Description   Final    URINE,  CATHETERIZED Performed at [place] [address], [place], [place] [address]    Special Requests   Final    NONE Performed at [facility], [place] [address] [place], [place] [address]    Culture   Final    NO GROWTH Performed at [facility], [place] [address] [place], [place] [address]    Report Status 01/30/2022 FINAL  Final     Labs: BNP (last 3 results) No results for input(s): "BNP" in the last 8760 hours. Basic Metabolic Panel: Recent Labs  Lab 01/30/22 0255 01/30/22 1246 01/31/22 0247 02/01/22 0402 02/02/22 0331 02/03/22 0328 02/04/22 0345 02/05/22 0336  NA 137   < > 133* 136 134* 134* 135 135  K 2.7*   < > 3.7 4.0 3.5 3.3* 4.6 3.6  CL 104   < > 100 102 99 99 101 101  CO2 25   < > '27 28 26 26 26 27  '$ GLUCOSE 97   < >  94 109* 107* 120* 116* 109*  BUN 15   < > 10 9 7* '9 9 11  '$ CREATININE 0.90   < > 0.92 0.86 0.84 1.10* 1.15* 1.33*  CALCIUM 8.6*   < > 8.1* 8.8* 8.5* 8.5* 8.7* 8.6*  MG 2.0  --   --  1.7 1.8 2.0  --   --   PHOS  --   --  2.6  --   --   --   --   --    < > = values in this interval not displayed.   Liver Function Tests: Recent Labs  Lab 01/31/22 0247  ALBUMIN 2.8*   Recent Labs  Lab 02/01/22 0402  LIPASE 50   No results for input(s): "AMMONIA" in the last 168 hours. CBC: Recent Labs  Lab 01/30/22 0255 01/30/22 1246 02/01/22 0402 02/02/22 0331 02/03/22 0328 02/04/22 0345 02/04/22 1453 02/05/22 0336  WBC 9.3   < > 10.9* 10.2 9.7 9.6  --  9.5  NEUTROABS 6.0  --   --   --   --   --   --   --   HGB 8.7*   < > 9.1* 9.1* 9.3* 9.5* 10.4* 9.2*  HCT 26.7*   < > 28.3* 28.0* 28.4* 29.7* 32.0* 28.7*  MCV 97.8   < > 99.3 98.2 97.3 99.0  --  97.6  PLT 212   < > 228 237 255 279  --  293   < > = values in this interval not displayed.   Cardiac Enzymes: No results for input(s): "CKTOTAL", "CKMB", "CKMBINDEX", "TROPONINI" in the last 168 hours. BNP: Invalid input(s): "POCBNP" CBG: No results for  input(s): "GLUCAP" in the last 168 hours. D-Dimer No results for input(s): "DDIMER" in the last 72 hours. Hgb A1c No results for input(s): "HGBA1C" in the last 72 hours. Lipid Profile No results for input(s): "CHOL", "HDL", "LDLCALC", "TRIG", "CHOLHDL", "LDLDIRECT" in the last 72 hours. Thyroid function studies No results for input(s): "TSH", "T4TOTAL", "T3FREE", "THYROIDAB" in the last 72 hours.  Invalid input(s): "FREET3" Anemia work up No results for input(s): "VITAMINB12", "FOLATE", "FERRITIN", "TIBC", "IRON", "RETICCTPCT" in the last 72 hours. Urinalysis    Component Value Date/Time   COLORURINE YELLOW 01/29/2022 0903   APPEARANCEUR HAZY (A) 01/29/2022 0903   LABSPEC 1.010 01/29/2022 0903   PHURINE 5.0 01/29/2022 0903   GLUCOSEU NEGATIVE 01/29/2022 0903   HGBUR MODERATE (A) 01/29/2022 0903   BILIRUBINUR NEGATIVE 01/29/2022 0903   BILIRUBINUR negative 04/28/2020 1139   KETONESUR NEGATIVE 01/29/2022 0903   PROTEINUR 30 (A) 01/29/2022 0903   UROBILINOGEN 0.2 04/28/2020 1139   UROBILINOGEN 0.2 01/08/2007 2322   NITRITE NEGATIVE 01/29/2022 0903   LEUKOCYTESUR MODERATE (A) 01/29/2022 0903   Sepsis Labs Recent Labs  Lab 02/02/22 0331 02/03/22 0328 02/04/22 0345 02/05/22 0336  WBC 10.2 9.7 9.6 9.5   Microbiology Recent Results (from the past 240 hour(s))  MRSA Next Gen by PCR, Nasal     Status: Abnormal   Collection Time: 01/28/22  6:42 PM   Specimen: Nasal Mucosa; Nasal Swab  Result Value Ref Range Status   MRSA by PCR Next Gen DETECTED (A) NOT DETECTED Final    Comment: (NOTE) The GeneXpert MRSA Assay (FDA approved for NASAL specimens only), is one component of a comprehensive MRSA colonization surveillance program. It is not intended to diagnose MRSA infection nor to guide or monitor treatment for MRSA infections. Test performance is not FDA approved in patients less than 2 years  old. Performed at [facility], [place] [address], [place], [place] [address]   Urine Culture     Status: None   Collection Time: 01/29/22  9:03 AM   Specimen: Urine, Catheterized  Result Value Ref Range Status   Specimen Description   Final    URINE, CATHETERIZED Performed at [place] [facility] [address], [place], [place] [address]    Special Requests   Final    NONE Performed at [facility], [place] [address] [place], [place] [address]    Culture   Final    NO GROWTH Performed at [facility], [place] [address] [place], [place] [address]    Report Status 01/30/2022 FINAL  Final    Please note: You were cared for by a hospitalist during your hospital stay. Once you are discharged, your primary care physician will handle any further medical issues. Please note that NO REFILLS for any discharge medications will be authorized once you are discharged, as it is imperative that you return to your primary care physician (or establish a relationship with a primary care physician if you do not have one) for your post hospital discharge needs so that they can reassess your need for medications and monitor your lab values.    Time coordinating discharge: 40 minutes  SIGNED:   Shelly Coss, MD  [facility] 02/05/2022, 8:48 AM Pager [phone]  If 7PM-7AM, please contact night-coverage [email or web address] Password [email or web address]

## 2022-02-05 NOTE — TOC Transition Note (Signed)
Transition of Care [facility]) - CM/SW Discharge Note   Patient Details  Name: Latoya Curry MRN: [id number] Date of Birth: October 25, 1930  [facility] CM/SW Contact:  Leeroy Cha, RN Phone Number: 02/05/2022, 10:14 AM   Clinical Narrative:     Per Star at [facility] can go to [id number].  [facility] called for transport at 1030am   Final next level of care: [place] Barriers to Discharge: Barriers Resolved   Patient Goals and CMS Choice Patient states their goals for this hospitalization and ongoing recovery are::  (Home) CMS Medicare.gov Compare Post Acute Care list provided to:: Patient Represenative (must comment) Choice offered to / list presented to : Spouse  Discharge Placement                       Discharge Plan and Services   Discharge Planning Services: CM Consult                                 Social Determinants of Health (SDOH) Interventions     Readmission Risk Interventions   No data to display

## 2022-02-05 NOTE — Plan of Care (Signed)

## 2022-02-06 DIAGNOSIS — N1330 Unspecified hydronephrosis: Secondary | ICD-10-CM | POA: Diagnosis not present

## 2022-02-06 DIAGNOSIS — F32A Depression, unspecified: Secondary | ICD-10-CM | POA: Diagnosis not present

## 2022-02-06 DIAGNOSIS — Z87448 Personal history of other diseases of urinary system: Secondary | ICD-10-CM | POA: Diagnosis not present

## 2022-02-06 DIAGNOSIS — K2970 Gastritis, unspecified, without bleeding: Secondary | ICD-10-CM | POA: Diagnosis not present

## 2022-02-06 DIAGNOSIS — K922 Gastrointestinal hemorrhage, unspecified: Secondary | ICD-10-CM | POA: Diagnosis not present

## 2022-02-06 DIAGNOSIS — I150 Renovascular hypertension: Secondary | ICD-10-CM | POA: Diagnosis not present

## 2022-02-06 DIAGNOSIS — Z09 Encounter for follow-up examination after completed treatment for conditions other than malignant neoplasm: Secondary | ICD-10-CM | POA: Diagnosis not present

## 2022-02-06 DIAGNOSIS — F39 Unspecified mood [affective] disorder: Secondary | ICD-10-CM | POA: Diagnosis not present

## 2022-02-06 DIAGNOSIS — K56699 Other intestinal obstruction unspecified as to partial versus complete obstruction: Secondary | ICD-10-CM | POA: Diagnosis not present

## 2022-02-06 DIAGNOSIS — I712 Thoracic aortic aneurysm, without rupture, unspecified: Secondary | ICD-10-CM | POA: Diagnosis not present

## 2022-02-06 DIAGNOSIS — E039 Hypothyroidism, unspecified: Secondary | ICD-10-CM | POA: Diagnosis not present

## 2022-02-06 DIAGNOSIS — D649 Anemia, unspecified: Secondary | ICD-10-CM | POA: Diagnosis not present

## 2022-02-06 DIAGNOSIS — K2971 Gastritis, unspecified, with bleeding: Secondary | ICD-10-CM | POA: Diagnosis not present

## 2022-02-10 ENCOUNTER — Encounter: Payer: Self-pay | Admitting: Internal Medicine

## 2022-02-10 DIAGNOSIS — K2970 Gastritis, unspecified, without bleeding: Secondary | ICD-10-CM | POA: Diagnosis not present

## 2022-02-10 DIAGNOSIS — F32A Depression, unspecified: Secondary | ICD-10-CM | POA: Diagnosis not present

## 2022-02-10 DIAGNOSIS — E039 Hypothyroidism, unspecified: Secondary | ICD-10-CM | POA: Diagnosis not present

## 2022-02-10 DIAGNOSIS — K922 Gastrointestinal hemorrhage, unspecified: Secondary | ICD-10-CM | POA: Diagnosis not present

## 2022-02-10 DIAGNOSIS — I150 Renovascular hypertension: Secondary | ICD-10-CM | POA: Diagnosis not present

## 2022-02-10 DIAGNOSIS — I712 Thoracic aortic aneurysm, without rupture, unspecified: Secondary | ICD-10-CM | POA: Diagnosis not present

## 2022-02-10 DIAGNOSIS — D649 Anemia, unspecified: Secondary | ICD-10-CM | POA: Diagnosis not present

## 2022-02-18 ENCOUNTER — Telehealth: Payer: Self-pay | Admitting: Family Medicine

## 2022-02-18 NOTE — Telephone Encounter (Signed)
Caller/Agency: Arline Asp [facility]) Callback Number: [phone] Requesting OT/PT/Skilled Nursing/Social Work/Speech Therapy: OT Frequency: 1 w 5

## 2022-02-18 NOTE — Telephone Encounter (Signed)
Verbal for OT done

## 2022-02-26 ENCOUNTER — Telehealth: Payer: Self-pay | Admitting: Family Medicine

## 2022-02-26 ENCOUNTER — Other Ambulatory Visit (INDEPENDENT_AMBULATORY_CARE_PROVIDER_SITE_OTHER): Payer: BLUE CROSS/BLUE SHIELD

## 2022-02-26 ENCOUNTER — Other Ambulatory Visit: Payer: Medicare HMO

## 2022-02-26 DIAGNOSIS — E039 Hypothyroidism, unspecified: Secondary | ICD-10-CM

## 2022-02-26 LAB — TSH: TSH: 9.95 u[IU]/mL — ABNORMAL HIGH (ref 0.35–5.50)

## 2022-02-26 NOTE — Telephone Encounter (Signed)
Caller/Agency: Sheliah Hatch [place] Number: [phone] Requesting OT/PT/Skilled Nursing/Social Work/Speech Therapy: Physical and occupational therapy  Frequency:  Patient's insurance changed so they had to discharge her. They are requesting verbal orders to reopen her case under new insurance. Okay to leave verbal on vm.

## 2022-02-26 NOTE — Telephone Encounter (Signed)
Verbal done

## 2022-02-27 ENCOUNTER — Encounter: Payer: Self-pay | Admitting: Family Medicine

## 2022-03-05 ENCOUNTER — Encounter: Payer: Self-pay | Admitting: Cardiothoracic Surgery

## 2022-03-05 DIAGNOSIS — N8111 Cystocele, midline: Secondary | ICD-10-CM | POA: Diagnosis not present

## 2022-03-05 DIAGNOSIS — N130 Hydronephrosis with ureteropelvic junction obstruction: Secondary | ICD-10-CM | POA: Diagnosis not present

## 2022-03-06 ENCOUNTER — Ambulatory Visit
Admission: RE | Admit: 2022-03-06 | Discharge: 2022-03-06 | Disposition: A | Discharge status: Home / Self Care | Payer: Medicare Other | Source: Ambulatory Visit | Attending: Cardiothoracic Surgery | Admitting: Cardiothoracic Surgery

## 2022-03-06 DIAGNOSIS — I2510 Atherosclerotic heart disease of native coronary artery without angina pectoris: Secondary | ICD-10-CM | POA: Diagnosis not present

## 2022-03-06 DIAGNOSIS — I712 Thoracic aortic aneurysm, without rupture, unspecified: Secondary | ICD-10-CM | POA: Diagnosis not present

## 2022-03-06 DIAGNOSIS — I253 Aneurysm of heart: Secondary | ICD-10-CM | POA: Diagnosis not present

## 2022-03-06 DIAGNOSIS — S22060A Wedge compression fracture of T7-T8 vertebra, initial encounter for closed fracture: Secondary | ICD-10-CM | POA: Diagnosis not present

## 2022-03-06 DIAGNOSIS — I7121 Aneurysm of the ascending aorta, without rupture: Secondary | ICD-10-CM

## 2022-03-06 MED ORDER — IOPAMIDOL (ISOVUE-370) INJECTION 76%
60.0000 mL | Freq: Once | INTRAVENOUS | Status: AC | PRN
  Administered 2022-03-06: 60 mL via INTRAVENOUS

## 2022-03-07 ENCOUNTER — Telehealth: Payer: Self-pay

## 2022-03-07 DIAGNOSIS — I7121 Aneurysm of the ascending aorta, without rupture: Secondary | ICD-10-CM

## 2022-03-07 NOTE — Telephone Encounter (Signed)
-----  Message from Dahlia Byes, MD sent at 03/07/2022  8:37 AM EST ----- Regarding: RE: CTA stat report Will eval at [facility] next week Patient needs BMP prior to [place]  ----- Message ----- From: Donnella Sham, RN Sent: 03/06/2022  11:42 AM EST To: Dahlia Byes, MD Subject: CTA stat report                                Latoya Curry with [facility] called w/ stat report of her TAA done today. Concerned about impression #2:  IMPRESSION:  2. 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 Does the patient have clinical symptoms of left flank pain or any changes in renal function?  Thanks, Caryl Pina

## 2022-03-07 NOTE — Telephone Encounter (Signed)
Patient's daughter, Blair Promise contacted and left message to call back. Patient should go to nearest [facility] to have blood drawn per Dr. Lucianne Lei Trigt's orders.

## 2022-03-10 ENCOUNTER — Ambulatory Visit: Payer: Medicare Other | Admitting: Cardiothoracic Surgery

## 2022-03-10 ENCOUNTER — Other Ambulatory Visit: Payer: Self-pay | Admitting: Urology

## 2022-03-10 ENCOUNTER — Telehealth: Payer: Self-pay | Admitting: Family Medicine

## 2022-03-10 ENCOUNTER — Encounter: Payer: Self-pay | Admitting: Cardiothoracic Surgery

## 2022-03-10 VITALS — BP 105/72 | HR 85 | Resp 20 | Ht 63.0 in | Wt 149.0 lb

## 2022-03-10 DIAGNOSIS — I7121 Aneurysm of the ascending aorta, without rupture: Secondary | ICD-10-CM | POA: Diagnosis not present

## 2022-03-10 NOTE — Progress Notes (Signed)
HPI: HPI: The patient is a 89 year old female with mild-moderate hypertension well-controlled on low-dose amlodipine and atenolol who was found to have a sending thoracic aortic fusiform aneurysm is an incidental finding in 2019.  This has been asymptomatic.  It originally measured 5.3 cm in diameter.  Her CT scan a year ago showed no significant change.  I personally reviewed the CTA of her thoracic aorta which was performed last week 03-06-22 which shows a very slight enlargement of the diameter of the ascending aorta now to 5.5 cm, basically unchanged.  She remains asymptomatic .  She has been followed for mild AI by her cardiologist Dr. Geraldo Pitter and this has been stable.  Patient is able to walk in the hallway of her assisted living facility once or twice a day.  She remains in sinus rhythm.  The patient was recently hospitalized in mid December for some melena found to be due to gastritis as well as urinary retention which improved with medical therapy.  She has a cystoscopy with probable ureteral stent scheduled later this month for which there is is no cardiac contraindication.  Since her hospitalization her oral intake has reduced.  Her daughters are concerned.  Her activity and ability to get up and dress herself is also been curtailed.  She has an appointment to be seen by her primary care physician soon.  There is no complaint of chest or back pain.    Current Outpatient Medications  Medication Sig Dispense Refill   Budeson-Glycopyrrol-Formoterol (BREZTRI AEROSPHERE) 160-9-4.8 MCG/ACT AERO Inhale 1 puff into the lungs daily as needed (for flares).     cetirizine (ZYRTEC) 10 MG tablet Take 10 mg by mouth daily.     Cholecalciferol (VITAMIN D3) 25 MCG (1000 UT) CHEW Chew 1,000 Units by mouth daily.     citalopram (CELEXA) 20 MG tablet Take 1 tablet (20 mg total) by mouth daily. 90 tablet 0   clobetasol cream (TEMOVATE) 4.12 % Apply 1 Application topically 2 (two) times daily as needed (for  irritation).     fesoterodine (TOVIAZ) 8 MG TB24 tablet TAKE 1 TABLET EVERY DAY (Patient taking differently: Take 8 mg by mouth at bedtime.) 90 tablet 3   furosemide (LASIX) 20 MG tablet Take 20 mg by mouth every Monday, Wednesday, and Friday.     levothyroxine (SYNTHROID) 25 MCG tablet Take 1 tablet (25 mcg total) by mouth 2 (two) times a week. (Patient taking differently: Take 25 mcg by mouth See admin instructions. Take 25 mcg by mouth in the morning before breakfast on Mon/Thurs only (in addition to the base dose of 50 mcg)) 90 tablet 3   levothyroxine (SYNTHROID) 50 MCG tablet Take 50 mcg by mouth daily before breakfast.      Multiple Vitamin (MULTI VITAMIN) TABS Take 1 tablet by mouth daily with breakfast.     pantoprazole (PROTONIX) 40 MG tablet Take 1 tablet (40 mg total) by mouth 2 (two) times daily before a meal. Take 1 pill twice daily for 30 days then continue once daily 90 tablet 0   potassium chloride SA (KLOR-CON M) 20 MEQ tablet Take 1 tablet (20 mEq total) by mouth 2 (two) times daily. 12 tablet 0   rosuvastatin (CRESTOR) 5 MG tablet Take 5 mg by mouth every Monday, Wednesday, and Friday.     traMADol (ULTRAM) 50 MG tablet Take 1 tablet (50 mg total) by mouth every 12 (twelve) hours as needed for moderate pain. 15 tablet 0   No current facility-administered medications for  this visit.     Physical Exam: Blood pressure 105/72, pulse 85, resp. rate 20, height '5\' 3"'$  (1.6 m), weight 149 lb (67.6 kg), SpO2 91 %.        Exam    General- alert and comfortable    Neck- no JVD, no cervical adenopathy palpable, no carotid bruit   Lungs- clear without rales, wheezes   Cor- regular rate and rhythm, no murmur , gallop   Abdomen- soft, non-tender   Extremities - warm, non-tender, minimal edema   Neuro- oriented, appropriate, no focal weakness  Diagnostic Tests:  CTA images personally reviewed from January 11.  There is been no significant change in her fusiform ascending aneurysm  which measures approxifour 0.5 cm.  No evidence of intramural hematoma or penetrating ulcer.  Impression: Stable asymptomatic fusiform aneurysm.  The patient is not an operative candidate due to her advanced age.  Best therapy is blood pressure control which has not been a problem.  The patient's daughters wish to schedule another scan next year for prognostic information.  Plan: Return in 1 year with CT scan of chest without contrast.   Dahlia Byes, MD [facility] [phone]

## 2022-03-10 NOTE — Telephone Encounter (Signed)
Patient's daughter Rondel Baton called to advise that her mom is acting very confused, she is not eating/drinking. They are not sure if she is dehydrated or what. Please call Ms. Delair back to advise what they should do. No openings currently available in the office. [phone]   LVM with Daughter to advise Dr. Ethelene Hal has openings this afternoon if patient would be okay to see him.

## 2022-03-10 NOTE — Telephone Encounter (Signed)
Pt has appt tomorrow

## 2022-03-11 ENCOUNTER — Ambulatory Visit (INDEPENDENT_AMBULATORY_CARE_PROVIDER_SITE_OTHER): Payer: Medicare Other | Admitting: Family Medicine

## 2022-03-11 ENCOUNTER — Encounter: Payer: Self-pay | Admitting: Family Medicine

## 2022-03-11 ENCOUNTER — Telehealth: Payer: Self-pay

## 2022-03-11 VITALS — BP 105/64 | HR 90 | Temp 98.3°F | Resp 16 | Wt 134.4 lb

## 2022-03-11 DIAGNOSIS — R531 Weakness: Secondary | ICD-10-CM | POA: Diagnosis not present

## 2022-03-11 DIAGNOSIS — R5383 Other fatigue: Secondary | ICD-10-CM

## 2022-03-11 DIAGNOSIS — Z8719 Personal history of other diseases of the digestive system: Secondary | ICD-10-CM | POA: Diagnosis not present

## 2022-03-11 DIAGNOSIS — R634 Abnormal weight loss: Secondary | ICD-10-CM | POA: Diagnosis not present

## 2022-03-11 DIAGNOSIS — K921 Melena: Secondary | ICD-10-CM | POA: Diagnosis not present

## 2022-03-11 LAB — HEMOCCULT GUIAC POC 1CARD (OFFICE): Fecal Occult Blood, POC: NEGATIVE

## 2022-03-11 LAB — CBC WITH DIFFERENTIAL/PLATELET
Basophils Absolute: 0.1 10*3/uL (ref 0.0–0.1)
Basophils Relative: 0.9 % (ref 0.0–3.0)
Eosinophils Absolute: 0.1 10*3/uL (ref 0.0–0.7)
Eosinophils Relative: 0.9 % (ref 0.0–5.0)
HCT: 34.4 % — ABNORMAL LOW (ref 36.0–46.0)
Hemoglobin: 11.3 g/dL — ABNORMAL LOW (ref 12.0–15.0)
Lymphocytes Relative: 9.1 % — ABNORMAL LOW (ref 12.0–46.0)
Lymphs Abs: 1.3 10*3/uL (ref 0.7–4.0)
MCHC: 32.8 g/dL (ref 30.0–36.0)
MCV: 85.6 fl (ref 78.0–100.0)
Monocytes Absolute: 0.9 10*3/uL (ref 0.1–1.0)
Monocytes Relative: 6.4 % (ref 3.0–12.0)
Neutro Abs: 11.9 10*3/uL — ABNORMAL HIGH (ref 1.4–7.7)
Neutrophils Relative %: 82.7 % — ABNORMAL HIGH (ref 43.0–77.0)
Platelets: 453 10*3/uL — ABNORMAL HIGH (ref 150.0–400.0)
RBC: 4.02 Mil/uL (ref 3.87–5.11)
RDW: 16.5 % — ABNORMAL HIGH (ref 11.5–15.5)
WBC: 14.4 10*3/uL — ABNORMAL HIGH (ref 4.0–10.5)

## 2022-03-11 LAB — COMPREHENSIVE METABOLIC PANEL
ALT: 8 U/L (ref 0–35)
AST: 16 U/L (ref 0–37)
Albumin: 3.5 g/dL (ref 3.5–5.2)
Alkaline Phosphatase: 54 U/L (ref 39–117)
BUN: 41 mg/dL — ABNORMAL HIGH (ref 6–23)
CO2: 34 mEq/L — ABNORMAL HIGH (ref 19–32)
Calcium: 9.4 mg/dL (ref 8.4–10.5)
Chloride: 85 mEq/L — ABNORMAL LOW (ref 96–112)
Creatinine, Ser: 1.74 mg/dL — ABNORMAL HIGH (ref 0.40–1.20)
GFR: 25.62 mL/min — ABNORMAL LOW (ref >60.00)
Glucose, Bld: 125 mg/dL — ABNORMAL HIGH (ref 70–99)
Potassium: 2.8 mEq/L — CL (ref 3.5–5.1)
Sodium: 132 mEq/L — ABNORMAL LOW (ref 135–145)
Total Bilirubin: 0.8 mg/dL (ref 0.2–1.2)
Total Protein: 6.6 g/dL (ref 6.0–8.3)

## 2022-03-11 NOTE — Patient Instructions (Signed)
Occult stool test in office was negative for blood. Please continue monitoring for any red or black/tarry stools. Please drop off a urine sample at your convenience.  Updating some blood work today.  Try drinking an ensure twice a day. If workup is negative and symptoms persist, we can proceed with cognitive workup/referral.

## 2022-03-11 NOTE — Telephone Encounter (Signed)
CRITICAL VALUE STICKER  CRITICAL VALUE: 2.9  RECEIVER (on-site recipient of call): Kristine Garbe, RMA  DATE & TIME NOTIFIED:  03/11/22, 4:40 PM  MESSENGER (representative from lab): [email or web address]   MD NOTIFIED: yes  TIME OF NOTIFICATION: 03/11/22, 4:40 PM  RESPONSE: pending

## 2022-03-11 NOTE — Telephone Encounter (Signed)
Contacted patient's daughter, Blair Promise about recent lab work that was not completed per Dr. Prescott Gum. Patient seen by her PCP recently with labwork done. Will advise per Dr. Prescott Gum to follow-up with PCP for concerning CT results.

## 2022-03-11 NOTE — Progress Notes (Signed)
Acute Office Visit  Subjective:     Patient ID: Latoya Curry, female    DOB: Jun 25, 1930, 89 y.o.   MRN: [id number]  Chief Complaint  Patient presents with   Fatigue    Patient is in today for fatigue, weakness.  Patient is here with her daughter. They report that for the past week or so patient has seemed more fatigued and weak - trouble getting herself dressed in the mornings, staying in bed longer than usual, mild forgetfulness regarding appointments and medications, decreased appetite with weight loss. She saw cardiology yesterday and they recommended she start twice daily ensures, which she is agreeable to. She states that she just usually isn't hungry (her roommate has told family that she doesn't eat much at mealtimes). Patient denies feeling confused, having any hallucinations, urinary symptoms, chest pain, dyspnea. She is scheduled for cystoscopy with urology next week.  Today was the first day in the past week that she was able to get herself dressed.    Wt Readings from Last 3 Encounters:  03/11/22 134 lb 6.4 oz (61 kg)  03/10/22 149 lb (67.6 kg)  02/05/22 149 lb 14.6 oz (68 kg)       ROS All review of systems negative except what is listed in the HPI      Objective:    BP 105/64   Pulse 90   Temp 98.3 F (36.8 C)   Resp 16   Wt 134 lb 6.4 oz (61 kg)   SpO2 98%   BMI 23.81 kg/m    Physical Exam Vitals reviewed.  Constitutional:      Appearance: Normal appearance.  Cardiovascular:     Rate and Rhythm: Normal rate and regular rhythm.     Pulses: Normal pulses.     Heart sounds: Normal heart sounds.  Pulmonary:     Effort: Pulmonary effort is normal.     Breath sounds: Normal breath sounds.  Abdominal:     General: Abdomen is flat. Bowel sounds are normal.     Palpations: Abdomen is soft.     Tenderness: There is no abdominal tenderness. There is no guarding.  Genitourinary:    Rectum: Guaiac result negative.  Musculoskeletal:     Cervical  back: Normal range of motion and neck supple. No rigidity or tenderness.     Right lower leg: No edema.     Left lower leg: No edema.  Lymphadenopathy:     Cervical: No cervical adenopathy.  Skin:    General: Skin is warm and dry.  Neurological:     Mental Status: She is alert and oriented to person, place, and time.  Psychiatric:        Mood and Affect: Mood normal.        Behavior: Behavior normal.        Thought Content: Thought content normal.        Judgment: Judgment normal.     Results for orders placed or performed in visit on 03/11/22  POCT Occult Blood Stool  Result Value Ref Range   Fecal Occult Blood, POC Negative Negative   Card #1 Date     Card #2 Fecal Occult Blod, POC     Card #2 Date     Card #3 Fecal Occult Blood, POC     Card #3 Date          Assessment & Plan:   1. Fatigue, unspecified type 2. Weight loss 3. Weakness Occult stool test in office was  negative for blood. Please continue monitoring for any red or black/tarry stools. Please drop off a urine sample at your convenience.  Updating some blood work today.  Try drinking an ensure twice a day. If workup is negative and symptoms persist, we can proceed with cognitive workup/referral.  Patient/family aware of signs/symptoms requiring further/urgent evaluation.    - CBC with Differential/Platelet - Comprehensive metabolic panel   Return in about 1 month (around 04/11/2022) for weight check and f/u with Lovena Le .  [name], NP

## 2022-03-11 NOTE — Telephone Encounter (Signed)
-----  Message from Dahlia Byes, MD sent at 03/11/2022  1:09 PM EST ----- Regarding: RE: CTA stat report Yes thanks- she needs a BMP with the primary visit  ----- Message ----- From: Donnella Sham, RN Sent: 03/11/2022   9:48 AM EST To: Dahlia Byes, MD Subject: RE: CTA stat report                            Hey Dr. Prescott Gum,  It does not look like she got her BMP. I know from your note that she is to follow-up with her PCP soon. Do you want her to follow up with them regarding her renal artery?  Thanks, Caryl Pina ----- Message ----- From: Dahlia Byes, MD Sent: 03/08/2022  10:10 AM EST To: Donnella Sham, RN Subject: RE: CTA stat report                            That works - thanks ----- Message ----- From: Donnella Sham, RN Sent: 03/07/2022   3:44 PM EST To: Ladon Applebaum, RN; Dahlia Byes, MD; # Subject: RE: CTA stat report                            Dr. Prescott Gum,  I called the patient's daughter Levada Dy this morning and left her a message. She did not get that message. I called her back today at 3:30p and she answered, but she is unable to get her mom to the lab before they close at 4:30p (they are closed Sat/Sun). I told her to come to the appt on Monday, and afterwards she can go across the street to [facility] to have her labs drawn. Hopefully this works for you as well.  Caryl Pina  ----- Message ----- From: Dahlia Byes, MD Sent: 03/07/2022   8:37 AM EST To: Donnella Sham, RN Subject: RE: CTA stat report                            Will eval at [place] next week Patient needs BMP prior to [place]  ----- Message ----- From: Donnella Sham, RN Sent: 03/06/2022  11:42 AM EST To: Dahlia Byes, MD Subject: CTA stat report                                Achille Rich with [facility] called w/ stat report of her TAA done today. Concerned about impression #2:  IMPRESSION:  2. 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 Does the patient have clinical symptoms of left flank pain or any changes in renal function?  Thanks, Caryl Pina

## 2022-03-12 ENCOUNTER — Emergency Department (HOSPITAL_BASED_OUTPATIENT_CLINIC_OR_DEPARTMENT_OTHER): Payer: Medicare Other

## 2022-03-12 ENCOUNTER — Encounter (HOSPITAL_COMMUNITY): Payer: Self-pay

## 2022-03-12 ENCOUNTER — Encounter (HOSPITAL_BASED_OUTPATIENT_CLINIC_OR_DEPARTMENT_OTHER): Payer: Self-pay

## 2022-03-12 ENCOUNTER — Inpatient Hospital Stay (HOSPITAL_BASED_OUTPATIENT_CLINIC_OR_DEPARTMENT_OTHER)
Admission: EM | Admit: 2022-03-12 | Discharge: 2022-03-18 | DRG: 660 | Disposition: A | Discharge status: Skilled Nursing Facility | Payer: Medicare Other | Attending: Internal Medicine | Admitting: Internal Medicine

## 2022-03-12 ENCOUNTER — Other Ambulatory Visit: Payer: Self-pay

## 2022-03-12 DIAGNOSIS — F32A Depression, unspecified: Secondary | ICD-10-CM | POA: Diagnosis present

## 2022-03-12 DIAGNOSIS — K5730 Diverticulosis of large intestine without perforation or abscess without bleeding: Secondary | ICD-10-CM | POA: Diagnosis not present

## 2022-03-12 DIAGNOSIS — E876 Hypokalemia: Secondary | ICD-10-CM | POA: Diagnosis not present

## 2022-03-12 DIAGNOSIS — N179 Acute kidney failure, unspecified: Principal | ICD-10-CM

## 2022-03-12 DIAGNOSIS — Z885 Allergy status to narcotic agent status: Secondary | ICD-10-CM | POA: Diagnosis not present

## 2022-03-12 DIAGNOSIS — K8020 Calculus of gallbladder without cholecystitis without obstruction: Secondary | ICD-10-CM | POA: Diagnosis not present

## 2022-03-12 DIAGNOSIS — K2970 Gastritis, unspecified, without bleeding: Secondary | ICD-10-CM | POA: Diagnosis present

## 2022-03-12 DIAGNOSIS — Z7401 Bed confinement status: Secondary | ICD-10-CM | POA: Diagnosis not present

## 2022-03-12 DIAGNOSIS — M545 Low back pain, unspecified: Secondary | ICD-10-CM | POA: Diagnosis present

## 2022-03-12 DIAGNOSIS — Z8249 Family history of ischemic heart disease and other diseases of the circulatory system: Secondary | ICD-10-CM | POA: Diagnosis not present

## 2022-03-12 DIAGNOSIS — F418 Other specified anxiety disorders: Secondary | ICD-10-CM | POA: Diagnosis not present

## 2022-03-12 DIAGNOSIS — Z881 Allergy status to other antibiotic agents status: Secondary | ICD-10-CM | POA: Diagnosis not present

## 2022-03-12 DIAGNOSIS — R0902 Hypoxemia: Secondary | ICD-10-CM | POA: Diagnosis present

## 2022-03-12 DIAGNOSIS — Z79890 Hormone replacement therapy (postmenopausal): Secondary | ICD-10-CM | POA: Diagnosis not present

## 2022-03-12 DIAGNOSIS — Z79899 Other long term (current) drug therapy: Secondary | ICD-10-CM | POA: Diagnosis not present

## 2022-03-12 DIAGNOSIS — E782 Mixed hyperlipidemia: Secondary | ICD-10-CM | POA: Diagnosis present

## 2022-03-12 DIAGNOSIS — J9811 Atelectasis: Secondary | ICD-10-CM | POA: Diagnosis present

## 2022-03-12 DIAGNOSIS — Z888 Allergy status to other drugs, medicaments and biological substances status: Secondary | ICD-10-CM | POA: Diagnosis not present

## 2022-03-12 DIAGNOSIS — Z806 Family history of leukemia: Secondary | ICD-10-CM

## 2022-03-12 DIAGNOSIS — N136 Pyonephrosis: Secondary | ICD-10-CM | POA: Diagnosis present

## 2022-03-12 DIAGNOSIS — R0989 Other specified symptoms and signs involving the circulatory and respiratory systems: Secondary | ICD-10-CM | POA: Diagnosis not present

## 2022-03-12 DIAGNOSIS — Z9049 Acquired absence of other specified parts of digestive tract: Secondary | ICD-10-CM

## 2022-03-12 DIAGNOSIS — K828 Other specified diseases of gallbladder: Secondary | ICD-10-CM | POA: Diagnosis not present

## 2022-03-12 DIAGNOSIS — B9620 Unspecified Escherichia coli [E. coli] as the cause of diseases classified elsewhere: Secondary | ICD-10-CM | POA: Diagnosis present

## 2022-03-12 DIAGNOSIS — N1330 Unspecified hydronephrosis: Secondary | ICD-10-CM

## 2022-03-12 DIAGNOSIS — I7121 Aneurysm of the ascending aorta, without rupture: Secondary | ICD-10-CM | POA: Diagnosis present

## 2022-03-12 DIAGNOSIS — E0500 Thyrotoxicosis with diffuse goiter without thyrotoxic crisis or storm: Secondary | ICD-10-CM | POA: Diagnosis present

## 2022-03-12 DIAGNOSIS — D638 Anemia in other chronic diseases classified elsewhere: Secondary | ICD-10-CM | POA: Diagnosis not present

## 2022-03-12 DIAGNOSIS — N3000 Acute cystitis without hematuria: Secondary | ICD-10-CM | POA: Diagnosis not present

## 2022-03-12 DIAGNOSIS — J9601 Acute respiratory failure with hypoxia: Secondary | ICD-10-CM

## 2022-03-12 DIAGNOSIS — E039 Hypothyroidism, unspecified: Secondary | ICD-10-CM | POA: Diagnosis present

## 2022-03-12 DIAGNOSIS — I10 Essential (primary) hypertension: Secondary | ICD-10-CM | POA: Diagnosis not present

## 2022-03-12 DIAGNOSIS — E559 Vitamin D deficiency, unspecified: Secondary | ICD-10-CM | POA: Diagnosis present

## 2022-03-12 DIAGNOSIS — N390 Urinary tract infection, site not specified: Secondary | ICD-10-CM | POA: Diagnosis present

## 2022-03-12 DIAGNOSIS — R410 Disorientation, unspecified: Secondary | ICD-10-CM | POA: Insufficient documentation

## 2022-03-12 DIAGNOSIS — I351 Nonrheumatic aortic (valve) insufficiency: Secondary | ICD-10-CM | POA: Diagnosis present

## 2022-03-12 DIAGNOSIS — E871 Hypo-osmolality and hyponatremia: Secondary | ICD-10-CM | POA: Diagnosis present

## 2022-03-12 DIAGNOSIS — Z96653 Presence of artificial knee joint, bilateral: Secondary | ICD-10-CM | POA: Diagnosis present

## 2022-03-12 DIAGNOSIS — K219 Gastro-esophageal reflux disease without esophagitis: Secondary | ICD-10-CM | POA: Diagnosis present

## 2022-03-12 DIAGNOSIS — N2889 Other specified disorders of kidney and ureter: Secondary | ICD-10-CM | POA: Diagnosis not present

## 2022-03-12 DIAGNOSIS — R531 Weakness: Secondary | ICD-10-CM | POA: Diagnosis not present

## 2022-03-12 DIAGNOSIS — Z87440 Personal history of urinary (tract) infections: Secondary | ICD-10-CM

## 2022-03-12 DIAGNOSIS — R4182 Altered mental status, unspecified: Secondary | ICD-10-CM | POA: Diagnosis not present

## 2022-03-12 DIAGNOSIS — N132 Hydronephrosis with renal and ureteral calculous obstruction: Secondary | ICD-10-CM | POA: Diagnosis not present

## 2022-03-12 DIAGNOSIS — Z66 Do not resuscitate: Secondary | ICD-10-CM | POA: Diagnosis present

## 2022-03-12 DIAGNOSIS — F419 Anxiety disorder, unspecified: Secondary | ICD-10-CM | POA: Diagnosis present

## 2022-03-12 DIAGNOSIS — Z882 Allergy status to sulfonamides status: Secondary | ICD-10-CM | POA: Diagnosis not present

## 2022-03-12 DIAGNOSIS — N280 Ischemia and infarction of kidney: Secondary | ICD-10-CM

## 2022-03-12 DIAGNOSIS — R8271 Bacteriuria: Secondary | ICD-10-CM | POA: Diagnosis not present

## 2022-03-12 DIAGNOSIS — G8929 Other chronic pain: Secondary | ICD-10-CM | POA: Diagnosis present

## 2022-03-12 DIAGNOSIS — I77810 Thoracic aortic ectasia: Secondary | ICD-10-CM | POA: Diagnosis present

## 2022-03-12 DIAGNOSIS — Z7982 Long term (current) use of aspirin: Secondary | ICD-10-CM

## 2022-03-12 DIAGNOSIS — I739 Peripheral vascular disease, unspecified: Secondary | ICD-10-CM | POA: Diagnosis not present

## 2022-03-12 LAB — CBC WITH DIFFERENTIAL/PLATELET
Abs Immature Granulocytes: 0.08 10*3/uL — ABNORMAL HIGH (ref 0.00–0.07)
Basophils Absolute: 0.1 10*3/uL (ref 0.0–0.1)
Basophils Relative: 1 %
Eosinophils Absolute: 0.1 10*3/uL (ref 0.0–0.5)
Eosinophils Relative: 1 %
HCT: 34.6 % — ABNORMAL LOW (ref 36.0–46.0)
Hemoglobin: 11.3 g/dL — ABNORMAL LOW (ref 12.0–15.0)
Immature Granulocytes: 1 %
Lymphocytes Relative: 10 %
Lymphs Abs: 1.2 10*3/uL (ref 0.7–4.0)
MCH: 28 pg (ref 26.0–34.0)
MCHC: 32.7 g/dL (ref 30.0–36.0)
MCV: 85.9 fL (ref 80.0–100.0)
Monocytes Absolute: 0.9 10*3/uL (ref 0.1–1.0)
Monocytes Relative: 7 %
Neutro Abs: 10 10*3/uL — ABNORMAL HIGH (ref 1.7–7.7)
Neutrophils Relative %: 80 %
Platelets: 491 10*3/uL — ABNORMAL HIGH (ref 150–400)
RBC: 4.03 MIL/uL (ref 3.87–5.11)
RDW: 15 % (ref 11.5–15.5)
WBC: 12.4 10*3/uL — ABNORMAL HIGH (ref 4.0–10.5)
nRBC: 0 % (ref 0.0–0.2)

## 2022-03-12 LAB — COMPREHENSIVE METABOLIC PANEL
ALT: 13 U/L (ref 0–44)
AST: 21 U/L (ref 15–41)
Albumin: 3.1 g/dL — ABNORMAL LOW (ref 3.5–5.0)
Alkaline Phosphatase: 52 U/L (ref 38–126)
Anion gap: 15 (ref 5–15)
BUN: 47 mg/dL — ABNORMAL HIGH (ref 8–23)
CO2: 31 mmol/L (ref 22–32)
Calcium: 9.1 mg/dL (ref 8.9–10.3)
Chloride: 85 mmol/L — ABNORMAL LOW (ref 98–111)
Creatinine, Ser: 2.01 mg/dL — ABNORMAL HIGH (ref 0.44–1.00)
GFR, Estimated: 23 mL/min — ABNORMAL LOW (ref >60)
Glucose, Bld: 123 mg/dL — ABNORMAL HIGH (ref 70–99)
Potassium: 2.1 mmol/L — CL (ref 3.5–5.1)
Sodium: 131 mmol/L — ABNORMAL LOW (ref 135–145)
Total Bilirubin: 0.8 mg/dL (ref 0.3–1.2)
Total Protein: 7.3 g/dL (ref 6.5–8.1)

## 2022-03-12 LAB — BRAIN NATRIURETIC PEPTIDE: B Natriuretic Peptide: 235.1 pg/mL — ABNORMAL HIGH (ref 0.0–100.0)

## 2022-03-12 LAB — URINALYSIS, MICROSCOPIC (REFLEX): WBC, UA: >50 WBC/hpf (ref 0–5)

## 2022-03-12 LAB — POTASSIUM: Potassium: 2.9 mmol/L — ABNORMAL LOW (ref 3.5–5.1)

## 2022-03-12 LAB — URINALYSIS, ROUTINE W REFLEX MICROSCOPIC
Glucose, UA: NEGATIVE mg/dL
Ketones, ur: NEGATIVE mg/dL
Nitrite: NEGATIVE
Protein, ur: 100 mg/dL — AB
Specific Gravity, Urine: >=1.03 (ref 1.005–1.030)
pH: 6 (ref 5.0–8.0)

## 2022-03-12 LAB — SODIUM, URINE, RANDOM: Sodium, Ur: <10 mmol/L

## 2022-03-12 LAB — MAGNESIUM: Magnesium: 2.1 mg/dL (ref 1.7–2.4)

## 2022-03-12 LAB — D-DIMER, QUANTITATIVE: D-Dimer, Quant: 4.57 ug/mL-FEU — ABNORMAL HIGH (ref 0.00–0.50)

## 2022-03-12 LAB — TSH: TSH: 6.599 u[IU]/mL — ABNORMAL HIGH (ref 0.350–4.500)

## 2022-03-12 MED ORDER — FESOTERODINE FUMARATE ER 8 MG PO TB24
8.0000 mg | ORAL_TABLET | Freq: Every day | ORAL | Status: DC
  Administered 2022-03-12: 8 mg via ORAL
  Filled 2022-03-12: qty 1

## 2022-03-12 MED ORDER — LACTATED RINGERS IV BOLUS
500.0000 mL | Freq: Once | INTRAVENOUS | Status: AC
  Administered 2022-03-12: 500 mL via INTRAVENOUS

## 2022-03-12 MED ORDER — SODIUM CHLORIDE 0.9 % IV SOLN
1.0000 g | INTRAVENOUS | Status: DC
  Administered 2022-03-14 – 2022-03-15 (×2): 1 g via INTRAVENOUS
  Filled 2022-03-12 (×2): qty 10

## 2022-03-12 MED ORDER — ACETAMINOPHEN 650 MG RE SUPP: 650.0000 mg | Freq: Four times a day (QID) | RECTAL | Status: DC | PRN

## 2022-03-12 MED ORDER — HEPARIN SODIUM (PORCINE) 5000 UNIT/ML IJ SOLN
5000.0000 [IU] | Freq: Three times a day (TID) | INTRAMUSCULAR | Status: DC
  Administered 2022-03-12: 5000 [IU] via SUBCUTANEOUS
  Filled 2022-03-12: qty 1

## 2022-03-12 MED ORDER — ONDANSETRON HCL 4 MG/2ML IJ SOLN
4.0000 mg | Freq: Once | INTRAMUSCULAR | Status: AC
  Administered 2022-03-12: 4 mg via INTRAVENOUS
  Filled 2022-03-12: qty 2

## 2022-03-12 MED ORDER — POTASSIUM CHLORIDE 20 MEQ PO PACK
60.0000 meq | PACK | ORAL | Status: AC
  Administered 2022-03-12: 60 meq via ORAL
  Filled 2022-03-12: qty 3

## 2022-03-12 MED ORDER — ROSUVASTATIN CALCIUM 5 MG PO TABS
5.0000 mg | ORAL_TABLET | ORAL | Status: DC
  Administered 2022-03-12: 5 mg via ORAL
  Filled 2022-03-12: qty 1

## 2022-03-12 MED ORDER — POTASSIUM CHLORIDE 10 MEQ/100ML IV SOLN
10.0000 meq | INTRAVENOUS | Status: AC
  Administered 2022-03-12 (×3): 10 meq via INTRAVENOUS
  Filled 2022-03-12 (×2): qty 100

## 2022-03-12 MED ORDER — FOLIC ACID 1 MG PO TABS
1.0000 mg | ORAL_TABLET | Freq: Every day | ORAL | Status: DC
  Administered 2022-03-12: 1 mg via ORAL
  Filled 2022-03-12: qty 1

## 2022-03-12 MED ORDER — LEVOTHYROXINE SODIUM 25 MCG PO TABS: 25.0000 ug | ORAL_TABLET | Freq: Every day | ORAL | Status: DC

## 2022-03-12 MED ORDER — PANTOPRAZOLE SODIUM 40 MG PO TBEC
40.0000 mg | DELAYED_RELEASE_TABLET | Freq: Every day | ORAL | Status: DC
  Administered 2022-03-12 – 2022-03-18 (×7): 40 mg via ORAL
  Filled 2022-03-12 (×7): qty 1

## 2022-03-12 MED ORDER — LEVOTHYROXINE SODIUM 50 MCG PO TABS: 50.0000 ug | ORAL_TABLET | Freq: Every day | ORAL | Status: DC

## 2022-03-12 MED ORDER — THIAMINE MONONITRATE 100 MG PO TABS
100.0000 mg | ORAL_TABLET | Freq: Every day | ORAL | Status: DC
  Administered 2022-03-12: 100 mg via ORAL
  Filled 2022-03-12: qty 1

## 2022-03-12 MED ORDER — CITALOPRAM HYDROBROMIDE 20 MG PO TABS
20.0000 mg | ORAL_TABLET | Freq: Every day | ORAL | Status: DC
  Administered 2022-03-12 – 2022-03-18 (×7): 20 mg via ORAL
  Filled 2022-03-12 (×7): qty 1

## 2022-03-12 MED ORDER — THIAMINE HCL 100 MG/ML IJ SOLN: 100.0000 mg | Freq: Every day | INTRAMUSCULAR | Status: DC

## 2022-03-12 MED ORDER — SODIUM CHLORIDE 0.9 % IV SOLN
1.0000 g | Freq: Once | INTRAVENOUS | Status: AC
  Administered 2022-03-12: 1 g via INTRAVENOUS
  Filled 2022-03-12: qty 10

## 2022-03-12 MED ORDER — LEVOTHYROXINE SODIUM 75 MCG PO TABS
75.0000 ug | ORAL_TABLET | Freq: Every day | ORAL | Status: DC
  Administered 2022-03-13 – 2022-03-18 (×6): 75 ug via ORAL
  Filled 2022-03-12 (×6): qty 1

## 2022-03-12 MED ORDER — SODIUM CHLORIDE 0.9 % IV SOLN: Freq: Once | INTRAVENOUS | Status: AC

## 2022-03-12 MED ORDER — SODIUM CHLORIDE 0.9 % IV SOLN: INTRAVENOUS | Status: DC

## 2022-03-12 MED ORDER — ACETAMINOPHEN 325 MG PO TABS
650.0000 mg | ORAL_TABLET | Freq: Four times a day (QID) | ORAL | Status: DC | PRN
  Administered 2022-03-14 – 2022-03-15 (×2): 650 mg via ORAL
  Filled 2022-03-12 (×2): qty 2

## 2022-03-12 NOTE — ED Provider Notes (Signed)
[facility]PART[facility] Provider Note   CSN: [id number] Arrival date & time: 03/12/22  1540     History  Chief Complaint  Patient presents with   Abnormal Labs    Latoya Curry is a 89 y.o. female.  89 year old female with a history of thoracic aortic aneurysm, Graves' disease on levothyroxine, hypokalemia, hypertension, hyperlipidemia, GERD who presents to the emergency department with abnormal labs from her primary care doctors.  History obtained per the patient and her daughter.  They state that she had a primary doctor appointment yesterday where they drew labs that showed that her potassium was low and she had worsening renal function and was referred to the emergency department for evaluation.    Says that since leaving the hospital in December for evaluation of melena they have noticed that she has been confused intermittently.  Her daughter is concerned that she has not been taking her medications as prescribed including her potassium.  Lives in an apartment with another elderly individual that is a friend.  Has had a mild dry cough.  Has also had decreased p.o. intake and has lost approximately 15 pounds in the last 2 to 3 weeks.  Denies any fevers, nausea or vomiting or diarrhea, dysuria or frequency.  They report that she has a known left sided ureteral obstruction that they are planning on having urology placed a stent in.  Is on Lasix.  Unsure of additional home medications.       Home Medications Prior to Admission medications   Medication Sig Start Date End Date Taking? Authorizing Provider  aspirin EC 81 MG tablet Take 81 mg by mouth every Monday, Wednesday, and Friday. Swallow whole.   Yes [provider]  Budeson-Glycopyrrol-Formoterol (BREZTRI AEROSPHERE) 160-9-4.8 MCG/ACT AERO Inhale 1 puff into the lungs daily as needed (for flares).   Yes [provider]  cetirizine (ZYRTEC) 10 MG tablet Take 10 mg by mouth daily.   Yes  [provider]  Cholecalciferol (VITAMIN D3) 25 MCG (1000 UT) CHEW Chew 1,000 Units by mouth daily.   Yes [provider]  citalopram (CELEXA) 20 MG tablet Take 1 tablet (20 mg total) by mouth daily. 12/20/21  Yes [name], NP  clobetasol cream (TEMOVATE) 0.86 % Apply 1 Application topically 2 (two) times daily as needed (for irritation).   Yes [provider]  fesoterodine (TOVIAZ) 8 MG TB24 tablet TAKE 1 TABLET EVERY DAY Patient taking differently: Take 8 mg by mouth at bedtime. 08/06/21  Yes Dahlstedt, Annie Main, MD  furosemide (LASIX) 20 MG tablet Take 20 mg by mouth every Monday, Wednesday, and Friday. 06/01/21  Yes [provider]  levothyroxine (SYNTHROID) 25 MCG tablet Take 1 tablet (25 mcg total) by mouth 2 (two) times a week. Patient taking differently: Take 25 mcg by mouth daily. Take with 50 mcg tablet for a total of 75 mcg. 01/13/22  Yes [name], NP  levothyroxine (SYNTHROID) 50 MCG tablet Take 50 mcg by mouth daily before breakfast. Take with 25 mcg tablet for a total of 75 mcg. 02/15/19  Yes [provider]  Multiple Vitamin (MULTI VITAMIN) TABS Take 1 tablet by mouth daily with breakfast.   Yes [provider]  pantoprazole (PROTONIX) 40 MG tablet Take 1 tablet (40 mg total) by mouth 2 (two) times daily before a meal. Take 1 pill twice daily for 30 days then continue once daily 02/05/22  Yes Adhikari, Tamsen Meek, MD  potassium chloride SA (KLOR-CON M) 20  MEQ tablet Take 1 tablet (20 mEq total) by mouth 2 (two) times daily. 01/22/22  Yes [name], NP  rosuvastatin (CRESTOR) 5 MG tablet Take 5 mg by mouth every Monday, Wednesday, and Friday. 07/17/19  Yes [provider]  traMADol (ULTRAM) 50 MG tablet Take 1 tablet (50 mg total) by mouth every 12 (twelve) hours as needed for moderate pain. 02/05/22  Yes Shelly Coss, MD      Allergies    Levofloxacin, Codeine, Simvastatin, Sulfamethoxazole-trimethoprim, and  Zestril [lisinopril]    Review of Systems   Review of Systems  Physical Exam Updated Vital Signs BP 127/72 (BP Location: Left Arm)   Pulse 74   Temp 98.4 F (36.9 C) (Oral)   Resp 17   Ht '5\' 3"'$  (1.6 m)   Wt 61 kg   SpO2 93%   BMI 23.81 kg/m  Physical Exam Vitals and nursing note reviewed.  Constitutional:      General: She is not in acute distress.    Appearance: She is well-developed.  HENT:     Head: Normocephalic and atraumatic.     Right Ear: External ear normal.     Left Ear: External ear normal.     Nose: Nose normal.  Eyes:     Extraocular Movements: Extraocular movements intact.     Conjunctiva/sclera: Conjunctivae normal.     Pupils: Pupils are equal, round, and reactive to light.  Cardiovascular:     Rate and Rhythm: Normal rate and regular rhythm.     Heart sounds: No murmur heard. Pulmonary:     Effort: Pulmonary effort is normal. No respiratory distress.     Breath sounds: Rales (Bibasilar) present.  Abdominal:     General: Abdomen is flat. There is no distension.     Palpations: Abdomen is soft. There is no mass.     Tenderness: There is no abdominal tenderness. There is no guarding.  Musculoskeletal:     Cervical back: Normal range of motion and neck supple.     Right lower leg: Edema (1+) present.     Left lower leg: Edema (1+) present.  Skin:    General: Skin is warm and dry.  Neurological:     General: No focal deficit present.     Mental Status: She is alert and oriented to person, place, and time. Mental status is at baseline.     Cranial Nerves: No cranial nerve deficit.     Sensory: No sensory deficit.     Motor: No weakness.  Psychiatric:        Mood and Affect: Mood normal.     ED Results / Procedures / Treatments   Labs (all labs ordered are listed, but only abnormal results are displayed) Labs Reviewed  COMPREHENSIVE METABOLIC PANEL - Abnormal; Notable for the following components:      Result Value   Sodium 131 (*)     Potassium 2.1 (*)    Chloride 85 (*)    Glucose, Bld 123 (*)    BUN 47 (*)    Creatinine, Ser 2.01 (*)    Albumin 3.1 (*)    GFR, Estimated 23 (*)    All other components within normal limits  CBC WITH DIFFERENTIAL/PLATELET - Abnormal; Notable for the following components:   WBC 12.4 (*)    Hemoglobin 11.3 (*)    HCT 34.6 (*)    Platelets 491 (*)    Neutro Abs 10.0 (*)    Abs Immature Granulocytes 0.08 (*)  All other components within normal limits  URINALYSIS, ROUTINE W REFLEX MICROSCOPIC - Abnormal; Notable for the following components:   APPearance TURBID (*)    Hgb urine dipstick MODERATE (*)    Bilirubin Urine SMALL (*)    Protein, ur 100 (*)    Leukocytes,Ua LARGE (*)    All other components within normal limits  BRAIN NATRIURETIC PEPTIDE - Abnormal; Notable for the following components:   B Natriuretic Peptide 235.1 (*)    All other components within normal limits  TSH - Abnormal; Notable for the following components:   TSH 6.599 (*)    All other components within normal limits  URINALYSIS, MICROSCOPIC (REFLEX) - Abnormal; Notable for the following components:   Bacteria, UA MANY (*)    All other components within normal limits  BASIC METABOLIC PANEL - Abnormal; Notable for the following components:   Sodium 132 (*)    Potassium 3.0 (*)    Chloride 94 (*)    BUN 48 (*)    Creatinine, Ser 3.32 (*)    Calcium 8.2 (*)    GFR, Estimated 13 (*)    All other components within normal limits  CBC - Abnormal; Notable for the following components:   WBC 11.5 (*)    RBC 3.46 (*)    Hemoglobin 9.9 (*)    HCT 29.8 (*)    All other components within normal limits  D-DIMER, QUANTITATIVE (NOT AT [facility]) - Abnormal; Notable for the following components:   D-Dimer, Quant 4.57 (*)    All other components within normal limits  POTASSIUM - Abnormal; Notable for the following components:   Potassium 2.9 (*)    All other components within normal limits  URINE CULTURE  MAGNESIUM   SODIUM, URINE, RANDOM  OSMOLALITY  UREA NITROGEN, URINE  HEPARIN LEVEL (UNFRACTIONATED)    EKG EKG Interpretation  Date/Time:  Wednesday March 12 2022 10:37:59 EST Ventricular Rate:  81 PR Interval:  53 QRS Duration: 111 QT Interval:  429 QTC Calculation: 498 R Axis:   27 Text Interpretation: Interpretation limited secondary to artifact Sinus rhythm Short PR interval Anteroseptal infarct, age indeterminate Minimal ST elevation, inferior leads Artifact in lead(s) I III aVR aVL aVF V1 V2 Confirmed by Margaretmary Eddy [phone]) on 03/12/2022 4:21:17 PM  Radiology CT Renal Stone Study  Result Date: 03/12/2022 CLINICAL DATA:  Bladder neck obstruction EXAM: CT ABDOMEN AND PELVIS WITHOUT CONTRAST TECHNIQUE: Multidetector CT imaging of the abdomen and pelvis was performed following the standard protocol without IV contrast. RADIATION DOSE REDUCTION: This exam was performed according to the departmental dose-optimization program which includes automated exposure control, adjustment of the mA and/or kV according to patient size and/or use of iterative reconstruction technique. COMPARISON:  Renal ultrasound earlier 03/12/2022.  CT 01/31/2022. FINDINGS: Lower chest: Breathing motion at the lung bases. Calcified nodule at the left lower lobe consistent with old granulomatous disease. Mild dependent atelectasis. No effusion. The heart is enlarged. Small pericardial effusion. Coronary artery calcifications are seen. Left hilar calcified lymph nodes noted at the edge of the imaging field. Hepatobiliary: On this non IV contrast exam, the liver is grossly preserved. Gallbladder is distended with sludge and stones Pancreas: Mild pancreatic atrophy. Stable mild pancreatic duct ectasia. Spleen: Spleen is nonenlarged.  Splenic granulomas. Adrenals/Urinary Tract: Adrenal glands are preserved. There is severe left and moderate severe right renal collecting system dilatation with perinephric stranding. No obstructing  renal stones. Previously on the contrast study there was a focal stricture identified along the proximal  and mid ureter on the left which is again suggested today. On the right there is a caliber change as well the mid ureter, level of the iliac vessels. Etiology is uncertain otherwise recommend further evaluation. Preserved contours of the urinary bladder with some luminal air. Stomach/Bowel: On this non oral contrast exam large bowel is of normal course and caliber with scattered stool. Scattered colonic diverticula. Surgical changes along the base of the cecum. There is moderate debris in the stomach. The small bowel grossly normal course and caliber. Vascular/Lymphatic: Normal caliber aorta and IVC with some scattered calcified plaque. There is slightly tortuous course of the mid abdominal aorta. Reproductive: Calcifications along the uterus. There is a large pessary device in place. No separate adnexal mass on this noncontrast study. Other: Anasarca. No significant ascites. No obvious free air. There is some mild stranding of the upper retroperitoneum near the course of the duodenal, unchanged from the prior examination. This includes along the course of the left renal vein. There is some soft tissue thickening as well. Musculoskeletal: Screws along the left femoral neck are identified. Diffuse degenerative changes along the pelvis and spine with curvature of the spine. Mild compression deformities asymmetric at L2 and T12 are noted. IMPRESSION: As seen on the ultrasound there is increasing collecting system dilatation bilaterally with some retroperitoneal stranding there is focal caliber change identified along the course of the mid ureters bilaterally. This would have a broad differential. In addition there is soft tissue thickening and stranding in the upper retroperitoneum near the third portion of the duodenal in the left renal vein. Recommend additional contrast examination including delays to better  define this area and the aforementioned abnormality of the kidneys. Long renal delays and imaging of the ureters may be of some benefit. Distended gallbladder with stones and sludge. Colonic diverticula. Pessary. Electronically Signed   By: Jill Side M.D.   On: 03/12/2022 17:22   US Renal  Result Date: 03/12/2022 CLINICAL DATA:  Hydronephrosis.  Renal insufficiency EXAM: RENAL / URINARY TRACT ULTRASOUND COMPLETE COMPARISON:  CT 01/31/2022 and older.  Ultrasound 2014. FINDINGS: Right Kidney: Renal measurements: 11.4 x 4.7 x 5.1 = volume: 142 mL. Moderate collecting system dilatation. No perinephric stranding. This is new from prior. There is a small echogenic area towards the lower pole of the right kidney measuring 13 mm consistent with known fat containing lesion on CT scan, angiomyolipoma Left Kidney: Renal measurements: 11.6 x 6.3 x 5.4 = volume: 207.2 mL. There is moderate to severe left-sided renal collecting system dilatation, similar to prior CT. No perinephric stranding. Bladder: Underdistended urinary bladder.  Preserved contour. Other: Limited visualization of the aorta and IVC IMPRESSION: Bilateral collecting system dilatation, more severe on the left than right. Left is similar to prior CT but the right is progressive. Please correlate for etiology. 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 Electronically Signed   By: Jill Side M.D.   On: 03/12/2022 13:59   DG Chest 2 View  Result Date: 03/12/2022 CLINICAL DATA:  Bibasilar rales, abnormal labs. EXAM: CHEST - 2 VIEW COMPARISON:  08/04/2019 and CT chest 03/06/2022. FINDINGS: Trachea is midline. Heart is enlarged, stable. Thoracic aorta is calcified. Calcified left lower lobe granuloma. Minimal bibasilar subsegmental volume loss. No airspace consolidation or pleural fluid. T8 and L1 compression fractures. IMPRESSION: 1. No acute findings. 2.  Aortic atherosclerosis (ICD10-I70.0). Electronically Signed   By: Lorin Picket M.D.   On: 03/12/2022 12:42   CT Head Wo  Contrast  Result Date: 03/12/2022 CLINICAL DATA:  Altered mental status for 2 weeks EXAM: CT HEAD WITHOUT CONTRAST TECHNIQUE: Contiguous axial images were obtained from the base of the skull through the vertex without intravenous contrast. RADIATION DOSE REDUCTION: This exam was performed according to the departmental dose-optimization program which includes automated exposure control, adjustment of the mA and/or kV according to patient size and/or use of iterative reconstruction technique. COMPARISON:  Brain MRI 06/06/2009 FINDINGS: Brain: No evidence of acute infarction, hemorrhage, hydrocephalus, extra-axial collection or mass lesion/mass effect. Generalized cortical atrophy, moderate to advanced. Chronic small vessel ischemia in the deep cerebral white matter. Vascular: No hyperdense vessel or unexpected calcification. Skull: Normal. Negative for fracture or focal lesion. Sinuses/Orbits: No acute finding. IMPRESSION: Generalized involution of the brain without acute superimposed finding. Electronically Signed   By: Jorje Guild M.D.   On: 03/12/2022 12:22    Procedures Procedures   Medications Ordered in ED Medications  cefTRIAXone (ROCEPHIN) 1 g in sodium chloride 0.9 % 100 mL IVPB (has no administration in time range)  0.9 %  sodium chloride infusion ( Intravenous New Bag/Given 03/12/22 2159)  rosuvastatin (CRESTOR) tablet 5 mg (5 mg Oral Given 03/12/22 2200)  citalopram (CELEXA) tablet 20 mg (20 mg Oral Given 03/12/22 2200)  pantoprazole (PROTONIX) EC tablet 40 mg (40 mg Oral Given 03/12/22 2200)  fesoterodine (TOVIAZ) tablet 8 mg (8 mg Oral Given 03/12/22 2200)  acetaminophen (TYLENOL) tablet 650 mg (has no administration in time range)    Or  acetaminophen (TYLENOL) suppository 650 mg (has no administration in time range)  levothyroxine (SYNTHROID) tablet 75 mcg (75 mcg Oral Given 03/13/22 0601)  heparin ADULT infusion 100 units/mL (25000  units/26m) (1,000 Units/hr Intravenous New Bag/Given 03/13/22 0307)  potassium chloride SA (KLOR-CON M) CR tablet 40 mEq (has no administration in time range)  potassium chloride 10 mEq in 100 mL IVPB (has no administration in time range)  lactated ringers bolus 500 mL (0 mLs Intravenous Stopped 03/12/22 1258)  potassium chloride 10 mEq in 100 mL IVPB (0 mEq Intravenous Stopped 03/12/22 1606)  potassium chloride (KLOR-CON) packet 60 mEq (60 mEq Oral Given 03/12/22 1237)  0.9 %  sodium chloride infusion ( Intravenous New Bag/Given 03/12/22 1240)  lactated ringers bolus 500 mL ( Intravenous Stopped 03/12/22 1350)  ondansetron (ZOFRAN) injection 4 mg (4 mg Intravenous Given 03/12/22 1354)  cefTRIAXone (ROCEPHIN) 1 g in sodium chloride 0.9 % 100 mL IVPB (1 g Intravenous New Bag/Given 03/12/22 1744)  potassium chloride SA (KLOR-CON M) CR tablet 40 mEq (40 mEq Oral Given 03/13/22 0256)  heparin bolus via infusion 2,000 Units (2,000 Units Intravenous Bolus from Bag 03/13/22 0300)    ED Course/ Medical Decision Making/ A&P Clinical Course as of 03/13/22 0717  Wed Mar 12, 2022  1234 Bladder scan with 0 mL of urine.  [RP]  [id number] KMarylyn Ishiharafrom medicine will admit the patient. [RP]  1352 Dr BRussella Darfrom vascular surgery aware. Feels that there is likely nothing acute to do but would like [facility].  [RP]    Clinical Course User Index [RP] PFransico Meadow MD                             Medical Decision Making Amount and/or Complexity of Data Reviewed Labs: ordered. Radiology: ordered.  Risk Prescription drug management. Decision regarding hospitalization.   BWHITLEY PATCHENis a 89y.o. female with  comorbidities that complicate the patient evaluation including thoracic aortic aneurysm, Graves' disease on levothyroxine, hypokalemia, hypertension, and hyperlipidemia who presents to the emergency department with increased creatinine and low potassium from her  primary care doctors.  Also has been having poor p.o. intake and has lost approximately 15 pounds in the past 2 weeks.  Initial Ddx:  Hypokalemia, arrhythmia, AKI, hydronephrosis, UTI, ICH, brain mass  MDM:  Based on patient's labs feel she likely has hypokalemia again today may be in the setting of not taking her home potassium.  Will check labs and magnesium and replenish if necessary.  Unclear what is causing her AKI and may be due to worsening hydronephrosis of unclear etiology.  Will check along with urinalysis to assess for any signs of infection which could be leading to her intermittent confusion.  Will also check CT head for this.  Plan:  Labs Urinalysis Magnesium CT head Chest x-ray  ED Summary/Re-evaluation:  Patient reassessed and was stable.  Peers to have worsening hypokalemia and renal function.  Did give some fluids but patient's AKI not clearly consistent with prerenal lab values so ultrasound was obtained which showed worsening bilateral hydronephrosis.  Patient was admitted to medicine for further management.  Urinalysis consistent with UTI and patient was given IV ceftriaxone.  Given her renal function a noncontrast CT scan of the abdomen was obtained and is pending at this time to further evaluate for the etiology of her bilateral hydronephrosis.  Multiple bladder scans were obtained in the emergency department that did not show significant urine.  Discussed with vascular surgery with her outpatient CTA that showed possible impaired perfusion of the kidney.  They do not feel that there is any thing acute to be done but would like for her to go to [facility] for evaluation.  Discussed with medicine who will consult urology based on CT scans.  This patient presents to the ED for concern of complaints listed in HPI, this involves an extensive number of treatment options, and is a complaint that carries with it a high risk of complications and morbidity. Disposition including potential  need for admission considered.   Dispo: Admit to Floor  Additional history obtained from daughter Records reviewed Outpatient Clinic Notes The following labs were independently interpreted: Chemistry and show AKI I independently reviewed the following imaging with scope of interpretation limited to determining acute life threatening conditions related to emergency care:  Renal ultrasound  and agree with the radiologist interpretation with the following exceptions: None I personally reviewed and interpreted cardiac monitoring: normal sinus rhythm  I personally reviewed and interpreted the pt's EKG: see above for interpretation  I have reviewed the patients home medications and made adjustments as needed Consults: Hospitalist and Vascular Surgery Social Determinants of health:  Elderly  Final Clinical Impression(s) / ED Diagnoses Final diagnoses:  AKI (acute kidney injury) ([place])  Hypokalemia  Hydronephrosis, unspecified hydronephrosis type    Rx / DC Orders ED Discharge Orders     None      CRITICAL CARE Performed by: Fransico Meadow   Total critical care time: 30 minutes  Critical care time was exclusive of separately billable procedures and treating other patients.  Critical care was necessary to treat or prevent imminent or life-threatening deterioration.  Critical care was time spent personally by me on the following activities: development of treatment plan with patient and/or surrogate as well as nursing, discussions with consultants, evaluation of patient's response to treatment, examination of patient, obtaining history from patient  or surrogate, ordering and performing treatments and interventions, ordering and review of laboratory studies, ordering and review of radiographic studies, pulse oximetry and re-evaluation of patient's condition.    Fransico Meadow, MD 03/13/22 [phone]

## 2022-03-12 NOTE — ED Triage Notes (Signed)
Here with daughter, had labwork drawn yesterday. States was called to come to ER d/t low potassium, potassium 2.8. Denies cramps/chest pain, no vomiting/diarrhea recently. Hx of hypokalemia. Lost 15 pounds in the last month.

## 2022-03-12 NOTE — Consult Note (Addendum)
[facility] [place]  Patient name: Latoya Curry MRN: [id number] DOB: Jan 24, 1931 Sex: female   REQUESTING PROVIDER:    Hospitalists   REASON FOR CONSULT:    Possible left renal artery occlusion  HISTORY OF PRESENT ILLNESS:   Latoya Curry is a 89 y.o. female, who was sent to the ER by her primary care physician due to worsening renal function and hypokalemia.  Her baseline creatinine in December 2023 was 0.9 and it had increased to 2.1.  She has a history of left-sided hydronephrosis.  She was scheduled for urology evaluation later this month.  CT scan in December showed a patent bilateral renal arteries, however CT scan a week ago showed the possibility of a hypoperfused left kidney.  The patient has a history of 5.5 cm ascending aortic aneurysm that is followed by cardiothoracic surgery.  She is not a candidate for repair given her advanced age.  She is medically managed for hypertension however this has been elevated recently.  She suffers from hypercholesterolemia but has a statin allergy.  She is a non-smoker.  PAST MEDICAL HISTORY    Past Medical History:  Diagnosis Date   Acute bronchitis 05/07/2020   Acute gangrenous appendicitis s/p lap appendectomy 06/24/2018 06/23/2018   Adjustment disorder 05/07/2020   Allergic bronchitis 05/07/2020   Allergic rhinitis 05/07/2020   Aortic regurgitation 05/10/2019   Aortic root enlargement ([place]) 08/19/2017   Appendicitis 05/07/2020   Ascending aorta dilation ([facility]) 08/19/2017   Benign paroxysmal positional vertigo 05/07/2020   Bronchitis 05/07/2020   Cardiac arrhythmia 05/07/2020   Chest discomfort 08/05/2017   Chest pain 05/07/2020   Chronic low back pain    Conjunctivitis of left eye 05/07/2020   Constipation 05/07/2020   Cough 09/23/2011   Followed in Pulmonary clinic/ [facility]/ [place]   - Sinus CT 09/25/2011 > neg    Diaphoresis 05/07/2020   Diarrhea 05/07/2020    Diverticular disease of colon 05/07/2020   Diverticulitis    Diverticulitis of colon 05/07/2020   DJD (degenerative joint disease) of knee    Eczema 05/07/2020   Encounter for general adult medical examination without abnormal findings 05/07/2020   Epistaxis 05/07/2020   Essential hypertension 08/05/2017   Fever 05/07/2020   Gastro-esophageal reflux disease without esophagitis 05/07/2020   Graves' disease 05/07/2020   Hearing loss 05/07/2020   History of diverticulitis 05/07/2020   Hypercholesteremia    Hypertension    Hyperthyroidism 05/07/2020   Hypokalemia 06/24/2018   Incarcerated incisional hernia s/p primary repair 06/24/2018 06/24/2018   Incontinence without sensory awareness 05/07/2020   Insomnia 05/07/2020   Left lower quadrant pain 05/07/2020   Loss of appetite 05/07/2020   Low back pain 05/07/2020   Mixed dyslipidemia 08/05/2017   Nasal crusting 05/07/2020   Neck pain 05/07/2020   Neurodermatitis    Noninflammatory disorder of vagina 05/07/2020   Obesity    Osteopenia    Other long term (current) drug therapy 05/07/2020   Pain in left hip 03/16/2019   Pain in right hand 05/07/2020   Postnasal drip 05/07/2020   Problem related to social environment 05/07/2020   Pure hypercholesterolemia 05/07/2020   PVC (premature ventricular contraction) 08/05/2017   Rash 05/07/2020   Recurrent UTI    Right hip pain 05/07/2020   Sciatica 05/07/2020   Skin sensation disturbance 05/07/2020   Sleep disorder 05/07/2020   Small bowel obstruction, partial, s/p lap adhesiolysis 06/24/2018 06/24/2018   Spasm 05/07/2020   Thoracic aortic aneurysm ([place])  Thoracic aortic aneurysm without rupture ([place]) 05/07/2020   Thoracic aortic atherosclerosis ([place]) 01/11/2020   Thyrotoxicosis 05/07/2020   Transient global amnesia    Urinary tract infectious disease 05/07/2020   Viral syndrome 05/07/2020   Vitamin D deficiency 05/07/2020   Weight loss 05/07/2020     FAMILY HISTORY   Family History  Problem Relation Age of Onset    Heart disease Father    Leukemia Mother     SOCIAL HISTORY:   Social History   Socioeconomic History   Marital status: Widowed    Spouse name: Not on file   Number of children: 3   Years of education: Not on file   Highest education level: Not on file  Occupational History   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  Tobacco Use   Smoking status: Never   Smokeless tobacco: Never  Vaping Use   Vaping Use: Never used  Substance and Sexual Activity   Alcohol use: No   Drug use: Never   Sexual activity: Not on file  Other Topics Concern   Not on file  Social History Narrative   Not on file   Social Determinants of Health   Financial Resource Strain: Not on file  Food Insecurity: No Food Insecurity (01/28/2022)   Hunger Vital Sign    Worried About Running Out of Food in the Last Year: Never true    Ran Out of Food in the Last Year: Never true  Transportation Needs: No Transportation Needs (01/28/2022)   PRAPARE - Hydrologist (Medical): No    Lack of Transportation (Non-Medical): No  Physical Activity: Not on file  Stress: Not on file  Social Connections: Not on file  Intimate Partner Violence: Not At Risk (01/28/2022)   Humiliation, Afraid, Rape, and Kick questionnaire    Fear of Current or Ex-Partner: No    Emotionally Abused: No    Physically Abused: No    Sexually Abused: No    ALLERGIES:    Allergies  Allergen Reactions   Levofloxacin Other (See Comments)    Cardiac advised to avoid/Aneurysm   Codeine Nausea And Vomiting   Simvastatin Other (See Comments)    Dizziness    Sulfamethoxazole-Trimethoprim Other (See Comments)     Dyspepsia    Zestril [Lisinopril] Cough    CURRENT MEDICATIONS:    Current Facility-Administered Medications  Medication Dose Route Frequency Provider Last Rate Last Admin   folic acid (FOLVITE) tablet 1 mg  1 mg Oral Daily Fransico Meadow, MD   1 mg at 03/12/22 1320   thiamine  (VITAMIN B1) tablet 100 mg  100 mg Oral Daily Fransico Meadow, MD   100 mg at 03/12/22 1320   Or   thiamine (VITAMIN B1) injection 100 mg  100 mg Intravenous Daily Fransico Meadow, MD        REVIEW OF SYSTEMS:   '[X]'$  denotes positive finding, '[ ]'$  denotes negative finding Cardiac  Comments:  Chest pain or chest pressure:    Shortness of breath upon exertion:    Short of breath when lying flat:    Irregular heart rhythm:        Vascular    Pain in calf, thigh, or hip brought on by ambulation:    Pain in feet at night that wakes you up from your sleep:     Blood clot in your veins:    Leg swelling:  Pulmonary    Oxygen at home:    Productive cough:     Wheezing:         Neurologic    Sudden weakness in arms or legs:     Sudden numbness in arms or legs:     Sudden onset of difficulty speaking or slurred speech:    Temporary loss of vision in one eye:     Problems with dizziness:         Gastrointestinal    Blood in stool:      Vomited blood:         Genitourinary    Burning when urinating:     Blood in urine:        Psychiatric    Major depression:         Hematologic    Bleeding problems:    Problems with blood clotting too easily:        Skin    Rashes or ulcers:        Constitutional    Fever or chills:     PHYSICAL EXAM:   Vitals:   03/12/22 1608 03/12/22 1730 03/12/22 1855 03/12/22 2045  BP:  114/64 (!) 109/54 113/62  Pulse:  73 79 72  Resp:  (!) '23 17 18  '$ Temp: 97.9 F (36.6 C)  98.8 F (37.1 C) 98.3 F (36.8 C)  TempSrc: Oral  Oral Oral  SpO2:  98% 98% 97%  Weight:      Height:        GENERAL: The patient is a well-nourished female, in no acute distress. The vital signs are documented above. CARDIAC: There is a regular rate and rhythm.  PULMONARY: Nonlabored respirations ABDOMEN: Soft and non-tender MUSCULOSKELETAL: There are no major deformities or cyanosis. NEUROLOGIC: No focal weakness or paresthesias are detected. SKIN:  There are no ulcers or rashes noted. PSYCHIATRIC: The patient has a normal affect.  STUDIES:   I have reviewed her CT angiogram with the following findings: 1. Stable fusiform aneurysmal dilation of the ascending thoracic aorta with a maximal diameter of 5.5 cm. Cardiothoracic surgery consultation has already been performed and the patient is under the care of a cardiothoracic surgeon. 2. 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 Does the patient have clinical symptoms of left flank pain or any changes in renal function? 3. Additional ancillary findings as above without significant interval change.   Renal duplex: Renal:     Right: Normal right Resisitive Index. No evidence of right renal         artery stenosis. Normal size right kidney.  Left:  Abnormal left Resisitve Index. No evidence of left renal         artery stenosis. Normal size of left kidney. Left kidney         perfusion appears decreased as compared to right with limited         visualization of the internal vasculature secondary to         significant collecting system dilitation, and low flow         velocities as compared to the right kidney.   ASSESSMENT and PLAN   Possible left renal artery occlusion: The patient CT scan from December shows patent renal arteries bilaterally.  She had a CT angiogram of the chest on January 11 that shows hypoperfusion of the left kidney however there scan stops above the origin of the renal artery  so this is not fully evaluated.  If she has occluded her renal artery, it has been for over a week and there would not be any role for vascular intervention, however since her renal artery is not well-visualized on the most recent scan, I am going to order a renal artery duplex.  Based off these findings, further recommendations will be made.  If it appears that she has a stenosis, we could consider  angiography with the possibility of stenting.  If the renal artery is occluded, there would not be any intervention recommended.  Addendum:  u/s suggests low flow in the left kidney without renal artery stenosis.  I am not sure if she has a correctable renal lesion.  The only way to know for sure is to proceed with renal angiogram.  Her creatinine continue to increase.  She would need some dy for the study.  I would recommend hydration and follow up labs to determine the appropriate timing of the angiogram.  This is not emergent, as this process has been going on for at least 1 week.  I will follow up her labs tomorrow and make plans for angiogram accordingly.  SHe had bilateral ureteral stents placed today.  I would like to see how this impacts her renal function.     Leia Alf, MD, FACS [facility] [phone] Pager [phone]

## 2022-03-12 NOTE — ED Notes (Signed)
Pt SpO2 decreased to 87% on RA, RN and RT at bedside, pt placed on 3L O2, SpO2 increased to 94%.

## 2022-03-12 NOTE — H&P (Signed)
History and Physical    DANIJA GOSA RDE:[id number] DOB: April 08, 1930 DOA: 03/12/2022  PCP: [name], NP  Patient coming from: [facility] ED  Chief Complaint: Abnormal labs  HPI: Latoya Curry is a 89 y.o. female with medical history significant of hypertension, GERD, diverticulosis, hypothyroidism, hyperlipidemia, hypokalemia, PVCs, depression, anxiety, moderate aortic regurgitation, ascending thoracic aortic fusiform aneurysm.  She was admitted last month 12/5-12/13 for melena and had EGD done which showed evidence of gastritis and gastric stenosis at the pylorus.  CT abdomen pelvis was negative for any extrinsic causes of gastric stenosis.  She was discharged on PPI twice daily for a month and then daily.  She was treated for possible UTI and CT was concerning for left hydroureteronephrosis.  She was seen by urology and initially had Foley catheter placed but later passed passed voiding trial, plan was outpatient follow-up.  She is scheduled for cystoscopy and stent placement with urology next week.   Patient with history of ascending thoracic aortic fusiform aneurysm initially discovered in 2019 and followed by cardiothoracic surgery.  She had a recent CTA of her thoracic aorta performed on 03/06/2022 which was showing a very slight enlargement of the diameter of the ascending aorta now to 5.5 cm.  Patient had a follow-up visit with cardiothoracic surgery on 03/10/2022 and CTA findings of ascending thoracic aortic aneurysm felt to be basically unchanged.  CT did show findings worrisome for occlusion of the left renal artery.  Seen by PCP yesterday for fatigue, generalized weakness, poor appetite, and weight loss.  Hemoglobin stable and occult stool test in the office was negative for blood.  Labs revealed AKI (creatinine 1.7, baseline 0.8), hypokalemia (potassium 2.8), WBC count 14.4.  Patient sent to the ED for further evaluation.  In the ED, SpO2 87% on room air and placed on 3 L O2.   Afebrile.  Labs significant for WBC 12.4 (improved), hemoglobin 11.3 (stable), platelet count 491k, sodium 131, potassium 2.1, chloride 85, bicarb 31, BUN 47, creatinine 2.0, glucose 123, normal LFTs, magnesium 2.1, BNP 235, TSH 6.59 (was 9.95 on labs done 2 weeks ago).  UA with large amount of leukocytes and microscopy showing 6-10 RBCs, >50 WBCs, and many bacteria.  Urine culture pending.  CT head and chest x-ray negative for acute finding.    CT renal stone study showing: "IMPRESSION: As seen on the ultrasound there is increasing collecting system dilatation bilaterally with some retroperitoneal stranding there is focal caliber change identified along the course of the mid ureters bilaterally. This would have a broad differential.   In addition there is soft tissue thickening and stranding in the upper retroperitoneum near the third portion of the duodenal in the left renal vein. Recommend additional contrast examination including delays to better define this area and the aforementioned abnormality of the kidneys. Long renal delays and imaging of the ureters may be of some benefit.   Distended gallbladder with stones and sludge.   Colonic diverticula.   Pessary."  Renal ultrasound showing: "IMPRESSION: Bilateral collecting system dilatation, more severe on the left than right. Left is similar to prior CT but the right is progressive. Please correlate for etiology.   Presumed angiomyolipoma along the lower pole of the right kidney."  Patient received oral potassium 60 mEq, IV potassium 10 mg x 3, ceftriaxone, and 1 L IV fluids.  Transferred to [facility] this evening.  ED physician spoke with vascular surgery about her case, they recommended admission to [facility] for  them to review.  Patient resting comfortably.  Informed me that she is in the hospital due to kidney problems and low potassium.  Patient told me she has not been taking her home potassium  supplement regularly.  She reports poor appetite but denies any nausea, vomiting, or abdominal pain.  Denies flank pain or any urinary symptoms.  Denies cough, shortness of breath, or chest pain.  She has no other complaints.  Review of Systems:  Review of Systems  All other systems reviewed and are negative.   Past Medical History:  Diagnosis Date   Acute bronchitis 05/07/2020   Acute gangrenous appendicitis s/p lap appendectomy 06/24/2018 06/23/2018   Adjustment disorder 05/07/2020   Allergic bronchitis 05/07/2020   Allergic rhinitis 05/07/2020   Aortic regurgitation 05/10/2019   Aortic root enlargement ([place]) 08/19/2017   Appendicitis 05/07/2020   Ascending aorta dilation ([facility]) 08/19/2017   Benign paroxysmal positional vertigo 05/07/2020   Bronchitis 05/07/2020   Cardiac arrhythmia 05/07/2020   Chest discomfort 08/05/2017   Chest pain 05/07/2020   Chronic low back pain    Conjunctivitis of left eye 05/07/2020   Constipation 05/07/2020   Cough 09/23/2011   Followed in Pulmonary clinic/ [facility]/ [place]   - Sinus CT 09/25/2011 > neg    Diaphoresis 05/07/2020   Diarrhea 05/07/2020   Diverticular disease of colon 05/07/2020   Diverticulitis    Diverticulitis of colon 05/07/2020   DJD (degenerative joint disease) of knee    Eczema 05/07/2020   Encounter for general adult medical examination without abnormal findings 05/07/2020   Epistaxis 05/07/2020   Essential hypertension 08/05/2017   Fever 05/07/2020   Gastro-esophageal reflux disease without esophagitis 05/07/2020   Graves' disease 05/07/2020   Hearing loss 05/07/2020   History of diverticulitis 05/07/2020   Hypercholesteremia    Hypertension    Hyperthyroidism 05/07/2020   Hypokalemia 06/24/2018   Incarcerated incisional hernia s/p primary repair 06/24/2018 06/24/2018   Incontinence without sensory awareness 05/07/2020   Insomnia 05/07/2020   Left lower quadrant pain 05/07/2020   Loss of appetite 05/07/2020   Low back pain 05/07/2020   Mixed  dyslipidemia 08/05/2017   Nasal crusting 05/07/2020   Neck pain 05/07/2020   Neurodermatitis    Noninflammatory disorder of vagina 05/07/2020   Obesity    Osteopenia    Other long term (current) drug therapy 05/07/2020   Pain in left hip 03/16/2019   Pain in right hand 05/07/2020   Postnasal drip 05/07/2020   Problem related to social environment 05/07/2020   Pure hypercholesterolemia 05/07/2020   PVC (premature ventricular contraction) 08/05/2017   Rash 05/07/2020   Recurrent UTI    Right hip pain 05/07/2020   Sciatica 05/07/2020   Skin sensation disturbance 05/07/2020   Sleep disorder 05/07/2020   Small bowel obstruction, partial, s/p lap adhesiolysis 06/24/2018 06/24/2018   Spasm 05/07/2020   Thoracic aortic aneurysm [facility])    Thoracic aortic aneurysm without rupture ([place]) 05/07/2020   Thoracic aortic atherosclerosis ([place]) 01/11/2020   Thyrotoxicosis 05/07/2020   Transient global amnesia    Urinary tract infectious disease 05/07/2020   Viral syndrome 05/07/2020   Vitamin D deficiency 05/07/2020   Weight loss 05/07/2020    Past Surgical History:  Procedure Laterality Date   APPENDECTOMY     BIOPSY  01/30/2022   Procedure: BIOPSY;  Surgeon: Wilford Corner, MD;  Location: [facility];  Service: Gastroenterology;;   COLON RESECTION SIGMOID  2004   Dr Dalbert Batman   ESOPHAGOGASTRODUODENOSCOPY (EGD) WITH PROPOFOL N/A 01/30/2022  Procedure: ESOPHAGOGASTRODUODENOSCOPY (EGD) WITH PROPOFOL;  Surgeon: Wilford Corner, MD;  Location: [facility];  Service: Gastroenterology;  Laterality: N/A;   EXPLORATORY LAPAROTOMY  2004   r/o pancreas mass.  Dr Dalbert Batman   IR RADIOLOGIST EVAL & MGMT  07/14/2018   LAPAROSCOPIC APPENDECTOMY N/A 06/24/2018   Procedure: ACUTE GANGRENOUS APPENDICITIS WITH ABSCESS, PARTIAL SMALL BOWEL OBSTRUCTION, INCISIONAL HERNIA REPAIR, LYSIS OF ADHESIONS;  Surgeon: Michael Boston, MD;  Location: [facility];  Service: General;  Laterality: N/A;   TOTAL KNEE ARTHROPLASTY     bilaterallly      reports that she has never smoked. She has never used smokeless tobacco. She reports that she does not drink alcohol and does not use drugs.  Allergies  Allergen Reactions   Levofloxacin Other (See Comments)    Cardiac advised to avoid/Aneurysm   Codeine Nausea And Vomiting   Simvastatin Other (See Comments)    Dizziness    Sulfamethoxazole-Trimethoprim Other (See Comments)     Dyspepsia    Zestril [Lisinopril] Cough    Family History  Problem Relation Age of Onset   Heart disease Father    Leukemia Mother     Prior to Admission medications   Medication Sig Start Date End Date Taking? Authorizing Provider  aspirin EC 81 MG tablet Take 81 mg by mouth every Monday, Wednesday, and Friday. Swallow whole.   Yes [provider]  Budeson-Glycopyrrol-Formoterol (BREZTRI AEROSPHERE) 160-9-4.8 MCG/ACT AERO Inhale 1 puff into the lungs daily as needed (for flares).   Yes [provider]  cetirizine (ZYRTEC) 10 MG tablet Take 10 mg by mouth daily.   Yes [provider]  Cholecalciferol (VITAMIN D3) 25 MCG (1000 UT) CHEW Chew 1,000 Units by mouth daily.   Yes [provider]  citalopram (CELEXA) 20 MG tablet Take 1 tablet (20 mg total) by mouth daily. 12/20/21  Yes [name], NP  clobetasol cream (TEMOVATE) 9.75 % Apply 1 Application topically 2 (two) times daily as needed (for irritation).   Yes [provider]  fesoterodine (TOVIAZ) 8 MG TB24 tablet TAKE 1 TABLET EVERY DAY Patient taking differently: Take 8 mg by mouth at bedtime. 08/06/21  Yes Dahlstedt, Annie Main, MD  furosemide (LASIX) 20 MG tablet Take 20 mg by mouth every Monday, Wednesday, and Friday. 06/01/21  Yes [provider]  levothyroxine (SYNTHROID) 25 MCG tablet Take 1 tablet (25 mcg total) by mouth 2 (two) times a week. Patient taking differently: Take 25 mcg by mouth daily. Take with 50 mcg tablet for a total of 75 mcg. 01/13/22  Yes [name], NP  levothyroxine  (SYNTHROID) 50 MCG tablet Take 50 mcg by mouth daily before breakfast. Take with 25 mcg tablet for a total of 75 mcg. 02/15/19  Yes [provider]  Multiple Vitamin (MULTI VITAMIN) TABS Take 1 tablet by mouth daily with breakfast.   Yes [provider]  pantoprazole (PROTONIX) 40 MG tablet Take 1 tablet (40 mg total) by mouth 2 (two) times daily before a meal. Take 1 pill twice daily for 30 days then continue once daily 02/05/22  Yes Adhikari, Tamsen Meek, MD  potassium chloride SA (KLOR-CON M) 20 MEQ tablet Take 1 tablet (20 mEq total) by mouth 2 (two) times daily. 01/22/22  Yes [name], NP  rosuvastatin (CRESTOR) 5 MG tablet Take 5 mg by mouth every Monday, Wednesday, and Friday. 07/17/19  Yes [provider]  traMADol (ULTRAM) 50 MG tablet Take 1 tablet (50 mg total) by mouth every 12 (  twelve) hours as needed for moderate pain. 02/05/22  Yes Shelly Coss, MD    Physical Exam: Vitals:   03/12/22 1600 03/12/22 1608 03/12/22 1730 03/12/22 1855  BP: 123/68  114/64 (!) 109/54  Pulse: 74  73 79  Resp: (!) 24  (!) 23 17  Temp:  97.9 F (36.6 C)  98.8 F (37.1 C)  TempSrc:  Oral  Oral  SpO2: 95%  98% 98%  Weight:      Height:        Physical Exam Vitals reviewed.  Constitutional:      General: She is not in acute distress. HENT:     Head: Normocephalic and atraumatic.     Mouth/Throat:     Mouth: Mucous membranes are dry.  Eyes:     Extraocular Movements: Extraocular movements intact.  Cardiovascular:     Rate and Rhythm: Normal rate and regular rhythm.     Pulses: Normal pulses.  Pulmonary:     Effort: Pulmonary effort is normal. No respiratory distress.     Breath sounds: Normal breath sounds. No wheezing or rales.  Abdominal:     General: Bowel sounds are normal. There is no distension.     Palpations: Abdomen is soft.     Tenderness: There is no abdominal tenderness.  Musculoskeletal:     Cervical back: Normal range of motion.     Right lower  leg: No edema.     Left lower leg: No edema.  Skin:    General: Skin is warm and dry.  Neurological:     General: No focal deficit present.     Mental Status: She is alert and oriented to person, place, and time.     Labs on Admission: I have personally reviewed following labs and imaging studies  CBC: Recent Labs  Lab 03/11/22 1213 03/12/22 1045  WBC 14.4* 12.4*  NEUTROABS 11.9* 10.0*  HGB 11.3* 11.3*  HCT 34.4* 34.6*  MCV 85.6 85.9  PLT 453.0* 818*   Basic Metabolic Panel: Recent Labs  Lab 03/11/22 1213 03/12/22 1045  NA 132* 131*  K 2.8* 2.1*  CL 85* 85*  CO2 34* 31  GLUCOSE 125* 123*  BUN 41* 47*  CREATININE 1.74* 2.01*  CALCIUM 9.4 9.1  MG  --  2.1   GFR: Estimated Creatinine Clearance: 15.7 mL/min (A) (by C-G formula based on SCr of 2.01 mg/dL (H)). Liver Function Tests: Recent Labs  Lab 03/11/22 1213 03/12/22 1045  AST 16 21  ALT 8 13  ALKPHOS 54 52  BILITOT 0.8 0.8  PROT 6.6 7.3  ALBUMIN 3.5 3.1*   No results for input(s): "LIPASE", "AMYLASE" in the last 168 hours. No results for input(s): "AMMONIA" in the last 168 hours. Coagulation Profile: No results for input(s): "INR", "PROTIME" in the last 168 hours. Cardiac Enzymes: No results for input(s): "CKTOTAL", "CKMB", "CKMBINDEX", "TROPONINI" in the last 168 hours. BNP (last 3 results) No results for input(s): "PROBNP" in the last 8760 hours. HbA1C: No results for input(s): "HGBA1C" in the last 72 hours. CBG: No results for input(s): "GLUCAP" in the last 168 hours. Lipid Profile: No results for input(s): "CHOL", "HDL", "LDLCALC", "TRIG", "CHOLHDL", "LDLDIRECT" in the last 72 hours. Thyroid Function Tests: Recent Labs    03/12/22 1226  TSH 6.599*   Anemia Panel: No results for input(s): "VITAMINB12", "FOLATE", "FERRITIN", "TIBC", "IRON", "RETICCTPCT" in the last 72 hours. Urine analysis:    Component Value Date/Time   COLORURINE YELLOW 03/12/2022 1304   APPEARANCEUR TURBID (  A)  03/12/2022 1304   LABSPEC >=1.030 03/12/2022 1304   PHURINE 6.0 03/12/2022 1304   GLUCOSEU NEGATIVE 03/12/2022 1304   HGBUR MODERATE (A) 03/12/2022 1304   BILIRUBINUR SMALL (A) 03/12/2022 1304   BILIRUBINUR negative 04/28/2020 1139   KETONESUR NEGATIVE 03/12/2022 1304   PROTEINUR 100 (A) 03/12/2022 1304   UROBILINOGEN 0.2 04/28/2020 1139   UROBILINOGEN 0.2 01/08/2007 2322   NITRITE NEGATIVE 03/12/2022 1304   LEUKOCYTESUR LARGE (A) 03/12/2022 1304    Radiological Exams on Admission: CT Renal Stone Study  Result Date: 03/12/2022 CLINICAL DATA:  Bladder neck obstruction EXAM: CT ABDOMEN AND PELVIS WITHOUT CONTRAST TECHNIQUE: Multidetector CT imaging of the abdomen and pelvis was performed following the standard protocol without IV contrast. RADIATION DOSE REDUCTION: This exam was performed according to the departmental dose-optimization program which includes automated exposure control, adjustment of the mA and/or kV according to patient size and/or use of iterative reconstruction technique. COMPARISON:  Renal ultrasound earlier 03/12/2022.  CT 01/31/2022. FINDINGS: Lower chest: Breathing motion at the lung bases. Calcified nodule at the left lower lobe consistent with old granulomatous disease. Mild dependent atelectasis. No effusion. The heart is enlarged. Small pericardial effusion. Coronary artery calcifications are seen. Left hilar calcified lymph nodes noted at the edge of the imaging field. Hepatobiliary: On this non IV contrast exam, the liver is grossly preserved. Gallbladder is distended with sludge and stones Pancreas: Mild pancreatic atrophy. Stable mild pancreatic duct ectasia. Spleen: Spleen is nonenlarged.  Splenic granulomas. Adrenals/Urinary Tract: Adrenal glands are preserved. There is severe left and moderate severe right renal collecting system dilatation with perinephric stranding. No obstructing renal stones. Previously on the contrast study there was a focal stricture identified  along the proximal and mid ureter on the left which is again suggested today. On the right there is a caliber change as well the mid ureter, level of the iliac vessels. Etiology is uncertain otherwise recommend further evaluation. Preserved contours of the urinary bladder with some luminal air. Stomach/Bowel: On this non oral contrast exam large bowel is of normal course and caliber with scattered stool. Scattered colonic diverticula. Surgical changes along the base of the cecum. There is moderate debris in the stomach. The small bowel grossly normal course and caliber. Vascular/Lymphatic: Normal caliber aorta and IVC with some scattered calcified plaque. There is slightly tortuous course of the mid abdominal aorta. Reproductive: Calcifications along the uterus. There is a large pessary device in place. No separate adnexal mass on this noncontrast study. Other: Anasarca. No significant ascites. No obvious free air. There is some mild stranding of the upper retroperitoneum near the course of the duodenal, unchanged from the prior examination. This includes along the course of the left renal vein. There is some soft tissue thickening as well. Musculoskeletal: Screws along the left femoral neck are identified. Diffuse degenerative changes along the pelvis and spine with curvature of the spine. Mild compression deformities asymmetric at L2 and T12 are noted. IMPRESSION: As seen on the ultrasound there is increasing collecting system dilatation bilaterally with some retroperitoneal stranding there is focal caliber change identified along the course of the mid ureters bilaterally. This would have a broad differential. In addition there is soft tissue thickening and stranding in the upper retroperitoneum near the third portion of the duodenal in the left renal vein. Recommend additional contrast examination including delays to better define this area and the aforementioned abnormality of the kidneys. Long renal delays and  imaging of the ureters may be of some benefit.  Distended gallbladder with stones and sludge. Colonic diverticula. Pessary. Electronically Signed   By: Jill Side M.D.   On: 03/12/2022 17:22   US Renal  Result Date: 03/12/2022 CLINICAL DATA:  Hydronephrosis.  Renal insufficiency EXAM: RENAL / URINARY TRACT ULTRASOUND COMPLETE COMPARISON:  CT 01/31/2022 and older.  Ultrasound 2014. FINDINGS: Right Kidney: Renal measurements: 11.4 x 4.7 x 5.1 = volume: 142 mL. Moderate collecting system dilatation. No perinephric stranding. This is new from prior. There is a small echogenic area towards the lower pole of the right kidney measuring 13 mm consistent with known fat containing lesion on CT scan, angiomyolipoma Left Kidney: Renal measurements: 11.6 x 6.3 x 5.4 = volume: 207.2 mL. There is moderate to severe left-sided renal collecting system dilatation, similar to prior CT. No perinephric stranding. Bladder: Underdistended urinary bladder.  Preserved contour. Other: Limited visualization of the aorta and IVC IMPRESSION: Bilateral collecting system dilatation, more severe on the left than right. Left is similar to prior CT but the right is progressive. Please correlate for etiology. 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 Electronically Signed   By: Jill Side M.D.   On: 03/12/2022 13:59   DG Chest 2 View  Result Date: 03/12/2022 CLINICAL DATA:  Bibasilar rales, abnormal labs. EXAM: CHEST - 2 VIEW COMPARISON:  08/04/2019 and CT chest 03/06/2022. FINDINGS: Trachea is midline. Heart is enlarged, stable. Thoracic aorta is calcified. Calcified left lower lobe granuloma. Minimal bibasilar subsegmental volume loss. No airspace consolidation or pleural fluid. T8 and L1 compression fractures. IMPRESSION: 1. No acute findings. 2.  Aortic atherosclerosis (ICD10-I70.0). Electronically Signed   By: Lorin Picket M.D.   On: 03/12/2022 12:42   CT Head Wo Contrast  Result Date:  03/12/2022 CLINICAL DATA:  Altered mental status for 2 weeks EXAM: CT HEAD WITHOUT CONTRAST TECHNIQUE: Contiguous axial images were obtained from the base of the skull through the vertex without intravenous contrast. RADIATION DOSE REDUCTION: This exam was performed according to the departmental dose-optimization program which includes automated exposure control, adjustment of the mA and/or kV according to patient size and/or use of iterative reconstruction technique. COMPARISON:  Brain MRI 06/06/2009 FINDINGS: Brain: No evidence of acute infarction, hemorrhage, hydrocephalus, extra-axial collection or mass lesion/mass effect. Generalized cortical atrophy, moderate to advanced. Chronic small vessel ischemia in the deep cerebral white matter. Vascular: No hyperdense vessel or unexpected calcification. Skull: Normal. Negative for fracture or focal lesion. Sinuses/Orbits: No acute finding. IMPRESSION: Generalized involution of the brain without acute superimposed finding. Electronically Signed   By: Jorje Guild M.D.   On: 03/12/2022 12:22    EKG: Independently reviewed.  Sinus rhythm, artifact, QTc 498, baseline wander in V6.  Assessment and Plan  AKI -CT done last month concerning for left hydroureteronephrosis and per chart patient is scheduled for cystoscopy and stent placement with urology next week. -Repeat CT showing increasing collecting system dilatation bilaterally with some retroperitoneal stranding there is focal caliber change identified along the course of the mid ureters bilaterally. -Renal ultrasound showing bilateral collecting system dilatation, more severe on the left than right. Left is similar to prior CT but the right is progressive. 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 -Creatinine 2.0, baseline 0.8 on labs done 5 weeks ago and has been increasing since then -Continue gentle IV fluid hydration -Avoid nephrotoxic agents/hold home  Lasix -Continue to monitor renal function closely -Consult urology in a.m.  Possible UTI -WBC count mildly elevated and improved since  yesterday.  No fever or signs of sepsis. -UA with evidence of pyuria and bacteriuria. -Continue ceftriaxone -Urine culture pending  Hypokalemia -Likely due to diuretic use.  Patient has not been taking her home potassium supplement regularly. -Magnesium is within normal range -Patient received potassium supplementation in the ED.  Continue to monitor labs and replace potassium as needed.  Concern for possible left renal artery occlusion -Vascular surgery consulted given concern for possible left renal artery occlusion per CTA done 03/06/2022.  Dr. Trula Slade has reviewed the scan and feels that only the top portion of the kidney could be visualized on this scan and did not appear well perfused although no obvious thrombus seen.  He feels that since the scan was done 6 days ago, if the kidney is not perfusing then patient is no longer a candidate for any intervention at this time.  He has ordered renal arterial duplex ultrasound for tomorrow.  If blood flow present, then arteriogram could be considered.  Intermittent confusion Family reported intermittent confusion to ED physician.  CT head negative for acute finding and no focal neurodeficit on exam.  TSH elevated but improved compared to prior labs.  At present, patient is AAOx4 and answering questions appropriately. -Continue to monitor  Acute hypoxemic respiratory failure SpO2 87% on room air in the ED, currently satting well on 2 L.  Patient does not use home oxygen.  Chest x-ray negative for acute finding.  Lungs clear on exam.  PE less likely given no tachycardia, chest pain, or clinical signs of DVT. -Check D-dimer, if positive, start IV heparin empirically and obtain VQ scan in the morning. -Continue supplemental oxygen, wean as tolerated  Mild hyponatremia -Continue gentle IV fluid hydration and  monitor labs -Check serum osmolarity  Ascending thoracic aortic aneurysm -She had a recent CTA of her thoracic aorta performed on 03/06/2022 which was showing a very slight enlargement of the diameter of the ascending aorta now to 5.5 cm.  Patient had a follow-up visit with cardiothoracic surgery on 03/10/2022 and CTA findings of ascending thoracic aortic aneurysm felt to be basically unchanged. -Outpatient cardiothoracic surgery follow-up.  Hypertension -Currently normotensive -Hold Lasix given AKI  GERD/gastritis -Continue Protonix  Hypothyroidism TSH 6.59, improved since labs done 2 weeks ago when it was 9.95. -Continue Synthroid 75 mcg daily -Close outpatient PCP follow-up for further labs and dose adjustment  Hyperlipidemia -Continue Crestor  Depression and anxiety -Continue citalopram  DVT prophylaxis: SQ Heparin Code Status: DNR/DNI (discussed with the patient) Family Communication: No family available at this time. Level of care: Telemetry bed Admission status: It is my clinical opinion that admission to INPATIENT is reasonable and necessary because of the expectation that this patient will require hospital care that crosses at least 2 midnights to treat this condition based on the medical complexity of the problems presented.  Given the aforementioned information, the predictability of an adverse outcome is felt to be significant.  Time Spent: 90+ minutes  Shela Leff MD [facility]  If 7PM-7AM, please contact night-coverage [email or web address]  03/12/2022, 7:21 PM

## 2022-03-12 NOTE — ED Notes (Signed)
Bladder scan was 52m.

## 2022-03-12 NOTE — Progress Notes (Signed)
Plan of Care Note for accepted transfer   Patient: Latoya Curry MRN: [id number]   DOA: 03/12/2022  Facility requesting transfer: [facility] Requesting Provider: Dr. Philip Aspen Reason for transfer: AKI, Hypokalemia. Facility course: 89 yo F w/ PMHx of HTN, GERD, hypothyroidism, HLD. Presenting with lab abnormalities. She was seen in PCP clinic and found to be in AKI and w/ a low K+. It was recommended that she go to ED for evaluation. W/u revealed SCr 2.01 (baseline 0.9 in beginning of December; she's been trending up since) and K+ of 2.1. EDP notes that she has a recent history of left sided hydronephrosis that was supposed to be w/u'd w/ urology w/ a cystoscopy and stent placement at the end of this month. In addition, she had a CTA ab performed to look at an AAA. CTS has been following her. It was noted on the CTA that there was an occluded left renal artery. That has not yet been followed up on. She was started on fluids and potassium in the ED. EDP spoke with vascular surgery about her case, and they recommended admission to [facility] for them to review.    Plan of care: The patient is accepted for admission to Telemetry unit, at [facility]. While holding at [facility], medical decision making responsibilities remain with the [place]. Upon arrival to [facility], [place] will assume care. Thank you.   Author: Jonnie Finner, DO 03/12/2022  Check [email or web address] for on-call coverage.  Nursing staff, Please call [place] number on Amion as soon as patient's arrival, so appropriate admitting provider can evaluate the pt.

## 2022-03-13 ENCOUNTER — Inpatient Hospital Stay (HOSPITAL_COMMUNITY): Payer: Medicare Other

## 2022-03-13 ENCOUNTER — Encounter (HOSPITAL_COMMUNITY)
Admission: EM | Disposition: A | Discharge status: Skilled Nursing Facility | Payer: Self-pay | Source: Home / Self Care | Attending: Internal Medicine

## 2022-03-13 ENCOUNTER — Inpatient Hospital Stay (HOSPITAL_COMMUNITY): Payer: Medicare Other | Admitting: Certified Registered Nurse Anesthetist

## 2022-03-13 ENCOUNTER — Encounter (HOSPITAL_COMMUNITY): Payer: Self-pay

## 2022-03-13 DIAGNOSIS — I739 Peripheral vascular disease, unspecified: Secondary | ICD-10-CM | POA: Diagnosis not present

## 2022-03-13 DIAGNOSIS — N132 Hydronephrosis with renal and ureteral calculous obstruction: Secondary | ICD-10-CM

## 2022-03-13 DIAGNOSIS — I10 Essential (primary) hypertension: Secondary | ICD-10-CM

## 2022-03-13 DIAGNOSIS — N3000 Acute cystitis without hematuria: Secondary | ICD-10-CM

## 2022-03-13 DIAGNOSIS — J9601 Acute respiratory failure with hypoxia: Secondary | ICD-10-CM | POA: Diagnosis not present

## 2022-03-13 DIAGNOSIS — N179 Acute kidney failure, unspecified: Secondary | ICD-10-CM

## 2022-03-13 DIAGNOSIS — E039 Hypothyroidism, unspecified: Secondary | ICD-10-CM | POA: Diagnosis not present

## 2022-03-13 DIAGNOSIS — F418 Other specified anxiety disorders: Secondary | ICD-10-CM

## 2022-03-13 DIAGNOSIS — N1330 Unspecified hydronephrosis: Secondary | ICD-10-CM

## 2022-03-13 DIAGNOSIS — D638 Anemia in other chronic diseases classified elsewhere: Secondary | ICD-10-CM

## 2022-03-13 HISTORY — PX: CYSTOSCOPY WITH RETROGRADE PYELOGRAM, URETEROSCOPY AND STENT PLACEMENT: SHX5789

## 2022-03-13 LAB — BASIC METABOLIC PANEL
Anion gap: 10 (ref 5–15)
BUN: 48 mg/dL — ABNORMAL HIGH (ref 8–23)
CO2: 28 mmol/L (ref 22–32)
Calcium: 8.2 mg/dL — ABNORMAL LOW (ref 8.9–10.3)
Chloride: 94 mmol/L — ABNORMAL LOW (ref 98–111)
Creatinine, Ser: 3.32 mg/dL — ABNORMAL HIGH (ref 0.44–1.00)
GFR, Estimated: 13 mL/min — ABNORMAL LOW (ref >60)
Glucose, Bld: 99 mg/dL (ref 70–99)
Potassium: 3 mmol/L — ABNORMAL LOW (ref 3.5–5.1)
Sodium: 132 mmol/L — ABNORMAL LOW (ref 135–145)

## 2022-03-13 LAB — CBC
HCT: 29.8 % — ABNORMAL LOW (ref 36.0–46.0)
Hemoglobin: 9.9 g/dL — ABNORMAL LOW (ref 12.0–15.0)
MCH: 28.6 pg (ref 26.0–34.0)
MCHC: 33.2 g/dL (ref 30.0–36.0)
MCV: 86.1 fL (ref 80.0–100.0)
Platelets: 347 10*3/uL (ref 150–400)
RBC: 3.46 MIL/uL — ABNORMAL LOW (ref 3.87–5.11)
RDW: 15.3 % (ref 11.5–15.5)
WBC: 11.5 10*3/uL — ABNORMAL HIGH (ref 4.0–10.5)
nRBC: 0 % (ref 0.0–0.2)

## 2022-03-13 LAB — UREA NITROGEN, URINE: Urea Nitrogen, Ur: 740 mg/dL

## 2022-03-13 LAB — T4, FREE: Free T4: 1.51 ng/dL — ABNORMAL HIGH (ref 0.61–1.12)

## 2022-03-13 LAB — OSMOLALITY: Osmolality: 288 mOsm/kg (ref 275–295)

## 2022-03-13 LAB — HEPARIN LEVEL (UNFRACTIONATED): Heparin Unfractionated: 0.3 IU/mL (ref 0.30–0.70)

## 2022-03-13 SURGERY — CYSTOURETEROSCOPY, WITH RETROGRADE PYELOGRAM AND STENT INSERTION
Anesthesia: General | Site: Bladder | Laterality: Bilateral

## 2022-03-13 MED ORDER — POTASSIUM CHLORIDE CRYS ER 20 MEQ PO TBCR
40.0000 meq | EXTENDED_RELEASE_TABLET | Freq: Once | ORAL | Status: AC
  Administered 2022-03-13: 40 meq via ORAL
  Filled 2022-03-13: qty 2

## 2022-03-13 MED ORDER — FENTANYL CITRATE (PF) 100 MCG/2ML IJ SOLN
25.0000 ug | INTRAMUSCULAR | Status: DC | PRN
  Administered 2022-03-13: 25 ug via INTRAVENOUS

## 2022-03-13 MED ORDER — MELATONIN 3 MG PO TABS
6.0000 mg | ORAL_TABLET | Freq: Every evening | ORAL | Status: AC | PRN
  Administered 2022-03-14 – 2022-03-16 (×2): 6 mg via ORAL
  Filled 2022-03-13 (×5): qty 2

## 2022-03-13 MED ORDER — PROPOFOL 10 MG/ML IV BOLUS
INTRAVENOUS | Status: DC | PRN
  Administered 2022-03-13: 20 mg via INTRAVENOUS
  Administered 2022-03-13: 80 mg via INTRAVENOUS

## 2022-03-13 MED ORDER — IOHEXOL 300 MG/ML  SOLN
INTRAMUSCULAR | Status: DC | PRN
  Administered 2022-03-13: 50 mL via URETHRAL

## 2022-03-13 MED ORDER — ONDANSETRON HCL 4 MG/2ML IJ SOLN
INTRAMUSCULAR | Status: DC | PRN
  Administered 2022-03-13: 4 mg via INTRAVENOUS

## 2022-03-13 MED ORDER — FESOTERODINE FUMARATE ER 4 MG PO TB24
4.0000 mg | ORAL_TABLET | Freq: Every day | ORAL | Status: DC
  Administered 2022-03-14 – 2022-03-17 (×4): 4 mg via ORAL
  Filled 2022-03-13 (×7): qty 1

## 2022-03-13 MED ORDER — CEFAZOLIN SODIUM-DEXTROSE 2-4 GM/100ML-% IV SOLN
2.0000 g | INTRAVENOUS | Status: AC
  Administered 2022-03-13: 2 g via INTRAVENOUS

## 2022-03-13 MED ORDER — ONDANSETRON HCL 4 MG/2ML IJ SOLN: 4.0000 mg | Freq: Once | INTRAMUSCULAR | Status: DC | PRN

## 2022-03-13 MED ORDER — PROPOFOL 10 MG/ML IV BOLUS
INTRAVENOUS | Status: AC
  Filled 2022-03-13: qty 20

## 2022-03-13 MED ORDER — SODIUM CHLORIDE 0.9 % IV SOLN: INTRAVENOUS | Status: DC

## 2022-03-13 MED ORDER — FENTANYL CITRATE (PF) 250 MCG/5ML IJ SOLN
INTRAMUSCULAR | Status: AC
  Filled 2022-03-13: qty 5

## 2022-03-13 MED ORDER — CHLORHEXIDINE GLUCONATE 0.12 % MT SOLN
15.0000 mL | Freq: Once | OROMUCOSAL | Status: AC
  Administered 2022-03-13: 15 mL via OROMUCOSAL

## 2022-03-13 MED ORDER — ACETAMINOPHEN 10 MG/ML IV SOLN: 1000.0000 mg | Freq: Once | INTRAVENOUS | Status: DC | PRN

## 2022-03-13 MED ORDER — CEFAZOLIN SODIUM-DEXTROSE 2-4 GM/100ML-% IV SOLN
INTRAVENOUS | Status: AC
  Filled 2022-03-13: qty 100

## 2022-03-13 MED ORDER — POTASSIUM CHLORIDE 10 MEQ/100ML IV SOLN
10.0000 meq | Freq: Once | INTRAVENOUS | Status: AC
  Administered 2022-03-13: 10 meq via INTRAVENOUS
  Filled 2022-03-13: qty 100

## 2022-03-13 MED ORDER — TECHNETIUM TO 99M ALBUMIN AGGREGATED
4.0000 | Freq: Once | INTRAVENOUS | Status: AC | PRN
  Administered 2022-03-13: 4 via INTRAVENOUS

## 2022-03-13 MED ORDER — LIDOCAINE 2% (20 MG/ML) 5 ML SYRINGE
INTRAMUSCULAR | Status: AC
  Filled 2022-03-13: qty 5

## 2022-03-13 MED ORDER — FENTANYL CITRATE (PF) 250 MCG/5ML IJ SOLN
INTRAMUSCULAR | Status: DC | PRN
  Administered 2022-03-13 (×2): 25 ug via INTRAVENOUS

## 2022-03-13 MED ORDER — OXYCODONE HCL 5 MG PO TABS
2.5000 mg | ORAL_TABLET | Freq: Once | ORAL | Status: AC
  Administered 2022-03-13: 2.5 mg via ORAL
  Filled 2022-03-13: qty 1

## 2022-03-13 MED ORDER — LIDOCAINE 2% (20 MG/ML) 5 ML SYRINGE
INTRAMUSCULAR | Status: DC | PRN
  Administered 2022-03-13: 60 mg via INTRAVENOUS

## 2022-03-13 MED ORDER — HEPARIN BOLUS VIA INFUSION
2000.0000 [IU] | Freq: Once | INTRAVENOUS | Status: AC
  Administered 2022-03-13: 2000 [IU] via INTRAVENOUS
  Filled 2022-03-13: qty 2000

## 2022-03-13 MED ORDER — BLISTEX MEDICATED EX OINT
TOPICAL_OINTMENT | CUTANEOUS | Status: DC | PRN
  Administered 2022-03-18: 1 via TOPICAL
  Filled 2022-03-13: qty 6.3

## 2022-03-13 MED ORDER — HEPARIN (PORCINE) 25000 UT/250ML-% IV SOLN
1000.0000 [IU]/h | INTRAVENOUS | Status: DC
  Administered 2022-03-13: 1000 [IU]/h via INTRAVENOUS
  Filled 2022-03-13: qty 250

## 2022-03-13 MED ORDER — PHENYLEPHRINE HCL-NACL 20-0.9 MG/250ML-% IV SOLN
INTRAVENOUS | Status: DC | PRN
  Administered 2022-03-13: 20 ug/min via INTRAVENOUS

## 2022-03-13 MED ORDER — SODIUM CHLORIDE 0.9 % IR SOLN
Status: DC | PRN
  Administered 2022-03-13: 1000 mL
  Administered 2022-03-13: 3000 mL via INTRAVESICAL

## 2022-03-13 MED ORDER — ORAL CARE MOUTH RINSE: 15.0000 mL | Freq: Once | OROMUCOSAL | Status: AC

## 2022-03-13 MED ORDER — LACTATED RINGERS IV SOLN: INTRAVENOUS | Status: DC

## 2022-03-13 MED ORDER — FENTANYL CITRATE (PF) 100 MCG/2ML IJ SOLN
INTRAMUSCULAR | Status: AC
  Filled 2022-03-13: qty 2

## 2022-03-13 SURGICAL SUPPLY — 26 items
ADAPTER CATH URET PLST 4-6FR (CATHETERS) IMPLANT
ADPR CATH URET STRL DISP 4-6FR (CATHETERS) ×1
BAG DRN RND TRDRP ANRFLXCHMBR (UROLOGICAL SUPPLIES) ×1
BAG URINE DRAIN 2000ML AR STRL (UROLOGICAL SUPPLIES) ×1 IMPLANT
BAG URO CATCHER STRL LF (MISCELLANEOUS) ×1 IMPLANT
CATH FOLEY 2WAY 5CC 16FR (CATHETERS) ×1
CATH URET 5FR 28IN OPEN ENDED (CATHETERS) IMPLANT
CATH URETL OPEN END 6FR 70 (CATHETERS) IMPLANT
CATH URTH STD 16FR FL 2W DRN (CATHETERS) ×1 IMPLANT
GLOVE BIOGEL M 7.0 STRL (GLOVE) ×1 IMPLANT
GOWN STRL REUS W/TWL LRG LVL3 (GOWN DISPOSABLE) ×1 IMPLANT
GUIDEWIRE ANG ZIPWIRE 038X150 (WIRE) IMPLANT
GUIDEWIRE STR DUAL SENSOR (WIRE) ×2 IMPLANT
IV CATH 14GX2 1/4 (CATHETERS) IMPLANT
IV NS 1000ML (IV SOLUTION)
IV NS 1000ML BAXH (IV SOLUTION) ×1 IMPLANT
KIT TURNOVER KIT A (KITS) IMPLANT
MANIFOLD NEPTUNE II (INSTRUMENTS) ×1 IMPLANT
PACK CYSTO (CUSTOM PROCEDURE TRAY) ×1 IMPLANT
STENT CONTOUR 6FRX24X.038 (STENTS) IMPLANT
STENT CONTOUR 6FRX26X.038 (STENTS) ×1 IMPLANT
STENT CONTOUR 8FR X 24 (STENTS) IMPLANT
STENT URET 6FRX24 CONTOUR (STENTS) IMPLANT
STENT URET 6FRX26 CONTOUR (STENTS) IMPLANT
SYR CONTROL 10ML LL (SYRINGE) IMPLANT
TUBING CONNECTING 10 (TUBING) ×1 IMPLANT

## 2022-03-13 NOTE — Op Note (Signed)
Operative Note  Preoperative diagnosis:  1.  Bilateral hydronephrosis  Postoperative diagnosis: 1.  Bilateral hydronephrosis  Procedure(s): 1.  Cystoscopy 2. Bilateral retrograde pyelogram 3.  Bilateral diagnostic ureteroscopy 4.  Bilateral ureteral stent placement 5. Fluoroscopy with intraoperative interpretation  Surgeon: Rexene Alberts, MD  Assistants:  None  Anesthesia:  General  Complications:  None  EBL:  Minimal  Specimens: 1.  ID Type Source Tests Collected by Time Destination  A : Left Renal Pelvis Culture Urine Urine, Cystoscope URINE CULTURE Janith Lima, MD 03/13/2022 1643     Drains/Catheters: 1.  Right 6Fr x 26cm ureteral stent without tether string 2.  Left 6 French by 28 cm ureteral stent without a tether string  Intraoperative findings:   Cystoscopy demonstrated evidence of prolapse.  Pessary removed in order to facilitate identification of the ureters.  Inflammation throughout the trigone and posterior wall of the bladder consistent with cystitis.  Left ureteral orifice initially difficult to locate given cystitis. Initial left retrograde pyelogram with abrupt end of contrast in the left mid ureter.  Eventual complete left retrograde pyelogram with severe left hydroureteronephrosis to the level of the left proximal ureter.  Pinpoint left ureter extending at least 8 cm in her proximal left ureter.  Representative images saved. Right retrograde pyelogram with severe right hydroureteronephrosis to the level of the right mid ureter where there was a pinpoint stricture. Diagnostic right ureteroscopy with semirigid ureteroscope with evidence of stricture in her mid right ureter.  No evidence of suspicious lesion. Left diagnostic ureteroscopy at the level of the left mid ureter with semirigid ureteroscope with evidence of stricture with inability to advance beyond this area.  Unfortunately, there were no flexible ureteroscope was present at [facility] for  this case. Successful bilateral ureteral stent placement.  After placing her left ureteral stent, there was purulent urine draining in and around the ureteral stent.  Indication:  Latoya Curry is a 89 y.o. female with history of chronic left-sided hydronephrosis who was admitted recently with history of UTI.  She was found to have bilateral hydronephrosis to the level of the mid ureter without stone identified.  She had acute kidney injury with rising creatinine around 3.3 from baseline of 1.  She was asymptomatic.  Given AKI and bilateral process, she is being taken the operating today for bilateral ureteral stent placement.  Description of procedure: After informed consent was obtained from the patient, the patient was identified and taken to the operating room and placed in the supine position.  General anesthesia was administered as well as perioperative IV antibiotics.  At the beginning of the case, a time-out was performed to properly identify the patient, the surgery to be performed, and the surgical site.  Sequential compression devices were applied to the lower extremities at the beginning of the case for DVT prophylaxis.  The patient was then placed in the dorsal lithotomy supine position, prepped and draped in sterile fashion.  We then passed the 21-French rigid cystoscope through the urethra and into the bladder under vision without any difficulty.  Her trigone was significantly inflamed consistent with cystitis.  He was initially very difficult to visualize ureteral orifice.  I did remove her pessary in order to properly visualize the ureteral orifices.  I then intubated the distal left ureter with a 5 [place] open-ended catheter and performed retrograde pyelogram with abrupt end of contrast that the left mid ureter.  I then attempted to pass a 0.038 sensor wire however this would  not initially advance.  I then used a angled Glidewire and was able to advance this into the mid ureter.  She did  have torturous left proximal ureter.  I repeated left retrograde pyelogram at the level the left mid ureter and there was significant stricture approximately 8 cm of her left proximal ureter that was pinpoint.  Contrast did fill the collecting system and there was filling defects consistent with debris within her left renal pelvis.  I then advanced a 5 French opening catheter into the left renal pelvis and aspirated with return of purulent urine.  This was sent for left renal pelvis urine culture.  I then replaced the wire.  I used semirigid ureteroscope to pass into the level of the mid ureter.  There, that she had evidence of a stricture.  I was unable to bypass any further.  Unfortunately, [facility] did not have any flexible ureteroscope present in the hospital.  I then replaced the sensor wire and over this wire, passed a 6 [place] by 28 cm left ureteral stent.  Of note, I initially tried to pass an 8 [place] stent however this would not bypass the area of stricture.  There was a curl seen within the renal pelvis.  There was significant purulent drainage around the stent after placement.  I attempted to perform repeat retrograde pyelogram adjacent to the stent however contrast would not pass alongside the stent.  In a similar fashion, performed right retrograde pyelogram demonstrated right severe hydronephrosis to the level of the right mid ureter.  I placed a 0.03 sensor wire and secured this to the drape as a safety wire.  I then passed the semirigid ureteroscope into the of the right mid ureter or identified a stricture.  I was able to bypass the stricture.  There was no evidence of mass in this location.  I advanced this into the proximal ureter.  Unfortunately, there was no flexible ureteroscope available to survey her renal pelvis.  I then passed a 6 [place] by 26 cm right ureteral stent over the previously placed wire with a curl within the renal pelvis and bladder respectively.  Given the amount  of purulent urine in her bladder, elected to leave a 16 French Foley catheter.  I replaced her pessary.   The patient tolerated the procedure well and there was no complication. Patient was awoken from anesthesia and taken to the recovery room in stable condition. I was present and scrubbed for the entirety of the case.  Plan: The bilateral stents in place.  The Foley catheter to gravity.  Follow-up left renal pelvis culture.  Continue broad-spectrum antibiotics.  Tailor as able.  She may require left nephrostomy tube placement given the degree of purulent urine within her left renal pelvis.  Matt R. [facility]  Pager: [phone]

## 2022-03-13 NOTE — Progress Notes (Signed)
Patient arrived back to [place] room 18 alert and oriented. Foley in place. Tele re-applied to pt. Bed in lowest position. Call light in reach. Family at bedside. Will continue to monitor pt.

## 2022-03-13 NOTE — Progress Notes (Addendum)
Progress Note    Latoya Curry   LKT:[id number]  DOB: 1930-10-04  DOA: 03/12/2022     1 PCP: [name], NP  Initial CC: increasing creatinine   Hospital Course: Latoya Curry is an 89 yo female with PMH ascending thoracic aortic fusiform aneurysm, left hydronephrosis, HTN, diverticulosis, hypothyroidism, HLD, GERD, PVCs, depression/anxiety.  She was admitted last month 12/5-12/13 for melena and had EGD done which showed evidence of gastritis and gastric stenosis at the pylorus.  CT abdomen pelvis was negative for any extrinsic causes of gastric stenosis.  She was discharged on PPI twice daily for a month and then daily.  She was treated for possible UTI and CT was concerning for left hydroureteronephrosis.  She was seen by urology and initially had Foley catheter placed but later passed passed voiding trial, plan was outpatient follow-up.  She was scheduled for cystoscopy and stent placement with urology next week.    Patient with history of ascending thoracic aortic fusiform aneurysm initially discovered in 2019 and followed by cardiothoracic surgery.  She had a recent CTA of her thoracic aorta performed on 03/06/2022 which was showing a very slight enlargement of the diameter of the ascending aorta now to 5.5 cm.  Patient had a follow-up visit with cardiothoracic surgery on 03/10/2022 and CTA findings of ascending thoracic aortic aneurysm felt to be basically unchanged.  CT did show findings worrisome for occlusion of the left renal artery.   Seen by PCP yesterday for fatigue, generalized weakness, poor appetite, and weight loss.  Hemoglobin stable and occult stool test in the office was negative for blood.  Labs revealed AKI (creatinine 1.7, baseline 0.8), hypokalemia (potassium 2.8), WBC count 14.4.  Patient sent to the ED for further evaluation.   In the ED, SpO2 87% on room air and placed on 3 L O2.  Afebrile.  Labs significant for WBC 12.4 (improved), hemoglobin 11.3 (stable), creat  2.01. UA also concerning for infection.  CT renal stone study was performed during workup in the ER which showed increasing bilateral hydronephrosis.  She was started on Rocephin.  Vascular surgery and urology were consulted on admission.  Interval History:  No events overnight.  Seen this afternoon in her room with daughter present bedside as well.  Patient seems to have no significant physical complaints.  Denies any shortness of breath, pleuritic chest pain, cough. Voiding seems decreased although patient requesting to use the restroom when seen.  Staff about to perform bladder scan as well.  Assessment and Plan:  AKI -CT done last month concerning for left hydroureteronephrosis and per chart patient was scheduled for cystoscopy and stent placement with urology next week. -Repeat CT showing increasing collecting system dilatation bilaterally with some retroperitoneal stranding there is focal caliber change identified along the course of the mid ureters bilaterally. -Renal ultrasound showing bilateral collecting system dilatation, more severe on the left than right. Left is similar to prior CT but the right is progressive. 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 -Creatinine 2.01 on admission, baseline 0.8 on labs done 5 weeks ago and has been increasing since then -Continue gentle IV fluid hydration -Perform serial bladder scans -Case discussed with urology as well given bilateral hydronephrosis.  Tentative plans for cystoscopy this afternoon   Possible UTI -WBC count mildly elevated and improved since yesterday.  No fever or signs of sepsis. -UA with evidence of pyuria and bacteriuria. -Continue ceftriaxone -Urine culture pending   Hypokalemia -Likely due to  diuretic use.  Patient has not been taking her home potassium supplement regularly. -Magnesium is within normal range -replete as needed   Concern for possible left renal artery  occlusion -Vascular surgery consulted given concern for possible left renal artery occlusion per CTA done 03/06/2022.   -Renal duplex obtained after admission.  Abnormal left resistive index, no evidence of left renal artery stenosis.  Report mentions "left kidney perfusion appears decreased as compared to right with limited visualization of internal vasculature secondary to significant collecting system dilatation" -Follow-up vascular surgery evaluation and recommendations; no need for anticoagulation from vascular surgery standpoint after discussion   Acute hypoxemic respiratory failure SpO2 87% on room air in the ED, currently satting well on 2 L.  Patient does not use home oxygen.  Chest x-ray negative for acute finding.  Lungs clear on exam.  PE less likely given no tachycardia, chest pain, or clinical signs of DVT. - d-dimer positive on admission; VQ scan obtained and is "very low probability" - d/c heparin drip - etiology of brief hypoxia possibly in setting of atelectasis (mild bibasilar atelectasis noted on CT even) -Wean off oxygen as able   Mild hyponatremia -Continue gentle IV fluid hydration and monitor labs   Ascending thoracic aortic aneurysm -She had a recent CTA of her thoracic aorta performed on 03/06/2022 which was showing a very slight enlargement of the diameter of the ascending aorta now to 5.5 cm.  Patient had a follow-up visit with cardiothoracic surgery on 03/10/2022 and CTA findings of ascending thoracic aortic aneurysm felt to be basically unchanged. -Outpatient cardiothoracic surgery follow-up.   Hypertension -Currently normotensive -Hold Lasix given AKI   GERD/gastritis -Continue Protonix   Hypothyroidism TSH 6.59, improved since labs done 2 weeks ago when it was 9.95 - FT4 is elevated however, 1.51 - will clarify if any dose change recently in synthroid before adjusting - continue current dose for now - if nothing else, will need repeat testing again in ~4  weeks  Asymptomatic cholelithiasis -Incidental finding noted on CT renal: distended gallbladder with stones and sludge -Currently no elevation in LFTs nor signs of obstruction and patient remains asymptomatic -Continue observation.  No further workup at this time   Hyperlipidemia -hold Crestor   Depression and anxiety -Continue citalopram   Old records reviewed in assessment of this patient  Antimicrobials: Rocephin 03/12/2022 >> current  DVT prophylaxis:   Heparin drip, now discontinued  Code Status:   Code Status: DNR  Mobility Assessment (last 72 hours)     Mobility Assessment     Row Name 03/13/22 1100 03/12/22 2030         Does patient have an order for bedrest or is patient medically unstable No - Continue assessment No - Continue assessment      What is the highest level of mobility based on the progressive mobility assessment? Level 6 (Walks independently in room and hall) - Balance while walking in room without assist - Complete Level 6 (Walks independently in room and hall) - Balance while walking in room without assist - Complete               Barriers to discharge:  Disposition Plan: Home 2 to 3 days Status is: Inpatient  Objective: Blood pressure 127/72, pulse 74, temperature 98.4 F (36.9 C), temperature source Oral, resp. rate 17, height '5\' 3"'$  (1.6 m), weight 61 kg, SpO2 93 %.  Examination:  Physical Exam Constitutional:      Appearance: Normal appearance.  HENT:  Head: Normocephalic and atraumatic.     Mouth/Throat:     Mouth: Mucous membranes are moist.  Eyes:     Extraocular Movements: Extraocular movements intact.  Cardiovascular:     Rate and Rhythm: Normal rate and regular rhythm.  Pulmonary:     Effort: Pulmonary effort is normal.     Breath sounds: Normal breath sounds.  Abdominal:     General: Bowel sounds are normal. There is no distension.     Palpations: Abdomen is soft.     Tenderness: There is no abdominal tenderness.   Musculoskeletal:        General: No swelling. Normal range of motion.     Cervical back: Normal range of motion and neck supple.  Skin:    General: Skin is warm and dry.  Neurological:     General: No focal deficit present.     Mental Status: She is alert.  Psychiatric:        Mood and Affect: Mood normal.      Consultants:  Urology Vascular surgery  Procedures:    Data Reviewed: Results for orders placed or performed during the hospital encounter of 03/12/22 (from the past 24 hour(s))  Sodium, urine, random     Status: None   Collection Time: 03/12/22  2:43 PM  Result Value Ref Range   Sodium, Ur <10 mmol/L  D-dimer, quantitative     Status: Abnormal   Collection Time: 03/12/22  9:44 PM  Result Value Ref Range   D-Dimer, Quant 4.57 (H) 0.00 - 0.50 ug/mL-FEU  Potassium     Status: Abnormal   Collection Time: 03/12/22  9:44 PM  Result Value Ref Range   Potassium 2.9 (L) 3.5 - 5.1 mmol/L  Osmolality     Status: None   Collection Time: 03/12/22  9:44 PM  Result Value Ref Range   Osmolality 288 275 - 295 mOsm/kg  Basic metabolic panel     Status: Abnormal   Collection Time: 03/13/22  5:03 AM  Result Value Ref Range   Sodium 132 (L) 135 - 145 mmol/L   Potassium 3.0 (L) 3.5 - 5.1 mmol/L   Chloride 94 (L) 98 - 111 mmol/L   CO2 28 22 - 32 mmol/L   Glucose, Bld 99 70 - 99 mg/dL   BUN 48 (H) 8 - 23 mg/dL   Creatinine, Ser 3.32 (H) 0.44 - 1.00 mg/dL   Calcium 8.2 (L) 8.9 - 10.3 mg/dL   GFR, Estimated 13 (L) >60 mL/min   Anion gap 10 5 - 15  CBC     Status: Abnormal   Collection Time: 03/13/22  5:03 AM  Result Value Ref Range   WBC 11.5 (H) 4.0 - 10.5 K/uL   RBC 3.46 (L) 3.87 - 5.11 MIL/uL   Hemoglobin 9.9 (L) 12.0 - 15.0 g/dL   HCT 29.8 (L) 36.0 - 46.0 %   MCV 86.1 80.0 - 100.0 fL   MCH 28.6 26.0 - 34.0 pg   MCHC 33.2 30.0 - 36.0 g/dL   RDW 15.3 11.5 - 15.5 %   Platelets 347 150 - 400 K/uL   nRBC 0.0 0.0 - 0.2 %  Heparin level (unfractionated)     Status: None    Collection Time: 03/13/22 11:17 AM  Result Value Ref Range   Heparin Unfractionated 0.30 0.30 - 0.70 IU/mL  T4, free     Status: Abnormal   Collection Time: 03/13/22 11:17 AM  Result Value Ref Range   Free T4 1.51 (H)  0.61 - 1.12 ng/dL    I have reviewed pertinent nursing notes, vitals, labs, and images as necessary. I have ordered labwork to follow up on as indicated.  I have reviewed the last notes from staff over past 24 hours. I have discussed patient's care plan and test results with nursing staff, CM/SW, and other staff as appropriate.  Time spent: Greater than 50% of the 55 minute visit was spent in counseling/coordination of care for the patient as laid out in the A&P.   LOS: 1 day   Dwyane Dee, MD [facility] 03/13/2022, 1:38 PM

## 2022-03-13 NOTE — Anesthesia Preprocedure Evaluation (Addendum)
Anesthesia Evaluation  Patient identified by MRN, date of birth, ID band Patient awake    Reviewed: Allergy & Precautions, NPO status , Patient's Chart, lab work & pertinent test results  Airway Mallampati: II  TM Distance: >3 FB Neck ROM: Full    Dental  (+) Missing   Pulmonary neg pulmonary ROS   Pulmonary exam normal        Cardiovascular hypertension, + Peripheral Vascular Disease  Normal cardiovascular exam  Thoracic aortic aneurysm. Stable per PVT, last visit 03/10/22.     CT Angio Chest 02/19/2021: IMPRESSION: 1. Aneurysmal ascending thoracic aorta, measuring 5.6 x 5.4 cm (previously 5.4 x 5.3 cm). Of note, greater than 5 mm growth over the past 12 months is associated with an increased risk of aneurysm rupture. Recommend cardiothoracic/vascular surgery referral if not already obtained.   From CT surgery note 03/18/2021: Because of the patient's advanced age and comorbid condition she is not a surgical candidate for repair of her thoracic aortic aneurysm.  Her risk for aortic tear/dissection remains about 10%    Neuro/Psych  PSYCHIATRIC DISORDERS Anxiety Depression     Neuromuscular disease    GI/Hepatic Neg liver ROS,GERD  Medicated and Controlled,,  Endo/Other  Hypothyroidism    Renal/GU ARFRenal disease     Musculoskeletal  (+) Arthritis ,  Chronic low back pain   Abdominal   Peds  Hematology  (+) Blood dyscrasia, anemia   Anesthesia Other Findings AKI acute kidney injury Bilateral hydronephrosis  Reproductive/Obstetrics                              Anesthesia Physical Anesthesia Plan  ASA: 4  Anesthesia Plan: General   Post-op Pain Management:    Induction: Intravenous  PONV Risk Score and Plan: 3 and Ondansetron, Dexamethasone and Treatment may vary due to age or medical condition  Airway Management Planned: LMA  Additional Equipment:   Intra-op Plan:    Post-operative Plan: Extubation in OR  Informed Consent: I have reviewed the patients History and Physical, chart, labs and discussed the procedure including the risks, benefits and alternatives for the proposed anesthesia with the patient or authorized representative who has indicated his/her understanding and acceptance.   Patient has DNR.  Discussed DNR with patient and Suspend DNR.   Dental advisory given  Plan Discussed with: CRNA  Anesthesia Plan Comments:          Anesthesia Quick Evaluation

## 2022-03-13 NOTE — Anesthesia Procedure Notes (Signed)
Procedure Name: LMA Insertion Date/Time: 03/13/2022 3:56 PM  Performed by: Terrence Dupont, CRNAPre-anesthesia Checklist: Patient identified, Emergency Drugs available, Suction available and Patient being monitored Patient Re-evaluated:Patient Re-evaluated prior to induction Oxygen Delivery Method: Circle System Utilized Preoxygenation: Pre-oxygenation with 100% oxygen Induction Type: IV induction Ventilation: Mask ventilation without difficulty LMA: LMA inserted LMA Size: 4.0 Number of attempts: 1 Placement Confirmation: positive ETCO2 Tube secured with: Tape Dental Injury: Teeth and Oropharynx as per pre-operative assessment

## 2022-03-13 NOTE — Progress Notes (Signed)
Patient transported down to vascular via bed by transportation staff

## 2022-03-13 NOTE — Transfer of Care (Signed)
Immediate Anesthesia Transfer of Care Note  Patient: Latoya Curry  Procedure(s) Performed: Romie Minus CYSTOSCOPY WITH RETROGRADE PYELOGRAM, URETEROSCOPY AND STENT PLACEMENT (Bilateral: Bladder)  Patient Location: PACU  Anesthesia Type:General  Level of Consciousness: drowsy  Airway & Oxygen Therapy: Patient Spontanous Breathing  Post-op Assessment: Report given to RN and Post -op Vital signs reviewed and stable  Post vital signs: Reviewed and stable  Last Vitals:  Vitals Value Taken Time  BP 101/59 03/13/22 1715  Temp    Pulse 68 03/13/22 1719  Resp 20 03/13/22 1719  SpO2 93 % 03/13/22 1719  Vitals shown include unvalidated device data.  Last Pain:  Vitals:   03/13/22 1431  TempSrc: Oral  PainSc: 0-No pain         Complications: No notable events documented.

## 2022-03-13 NOTE — Plan of Care (Signed)

## 2022-03-13 NOTE — TOC CM/SW Note (Signed)
  Transition of Care (TOC) Screening Note    Transition of Care Department (TOC)  will continue to monitor patient advancement through interdisciplinary progression rounds.   VQ scan r/o PE and renal US r/o  renal artery occlusion , heparin drip

## 2022-03-13 NOTE — Anesthesia Postprocedure Evaluation (Signed)
Anesthesia Post Note  Patient: KATLEN SEYER  Procedure(s) Performed: Romie Minus CYSTOSCOPY WITH RETROGRADE PYELOGRAM, URETEROSCOPY AND STENT PLACEMENT (Bilateral: Bladder)     Patient location during evaluation: PACU Anesthesia Type: General Level of consciousness: awake and alert Pain management: pain level controlled Vital Signs Assessment: post-procedure vital signs reviewed and stable Respiratory status: spontaneous breathing, nonlabored ventilation, respiratory function stable and patient connected to nasal cannula oxygen Cardiovascular status: blood pressure returned to baseline and stable Postop Assessment: no apparent nausea or vomiting Anesthetic complications: no  No notable events documented.  Last Vitals:  Vitals:   03/13/22 1715 03/13/22 1730  BP: (!) 101/59 111/71  Pulse: 68 68  Resp: 10 20  Temp: 36.5 C   SpO2: 90% 92%    Last Pain:  Vitals:   03/13/22 1715  TempSrc:   PainSc: Asleep                 Ahniyah Giancola S

## 2022-03-13 NOTE — Progress Notes (Signed)
Patient arrived back to [facility] room 18 alert and oriented. Bed in lowest position. Call light in reach. Bed alarm on, family at bedside. Will continue to monitor pt.

## 2022-03-13 NOTE — Consult Note (Signed)
Urology Consult   Physician requesting consult: Dr. Marlowe Sax, MD  Reason for consult: Bilateral hydronephrosis  History of Present Illness: MOMOKO SLEZAK is a 89 y.o. female with a past medical history of hypertension, GERD, diverticulitis, hypothyroidism, hyperlipidemia, hypokalemia, aortic regurgitation, ascending thoracic aortic fusiform aneurysm.  She is a patient of Dr. Diona Fanti with a history of left hydronephrosis and urinary retention.  She was admitted in December 2023 and found to have left hydronephrosis with urinary retention of unknown source.  He was ultimately able to pass a void trial.  CT scan demonstrated left hydronephrosis with transition point the left upper ureter with no apparent stone.  At the time, she was asymptomatic.  She was then scheduled for cystoscopy with bilateral retrograde pyelogram and stent placement with Dr. Diona Fanti later in January 2024.  She was seen by her PCP on 03/11/2022 with complaints of fatigue, generalized weakness, poor appetite and weight loss.  She was found to have an AKI.  Urinalysis revealed large amounts of leukocyte esterase, microscopic hematuria and many bacteria.  CT A/P without contrast 03/12/2022 revealed severe bilateral hydronephrosis with some retroperitoneal stranding with focal caliber change at the level of the mid ureters bilaterally.  Creatinine was found to be 3.32 from baseline of 1.  He has been resting comfortably.  She denies abdominal pain, flank pain, nausea or emesis.  She denies voiding complaints.  Past Medical History:  Diagnosis Date   Acute bronchitis 05/07/2020   Acute gangrenous appendicitis s/p lap appendectomy 06/24/2018 06/23/2018   Adjustment disorder 05/07/2020   Allergic bronchitis 05/07/2020   Allergic rhinitis 05/07/2020   Aortic regurgitation 05/10/2019   Aortic root enlargement ([place]) 08/19/2017   Appendicitis 05/07/2020   Ascending aorta dilation ([facility]) 08/19/2017   Benign paroxysmal positional vertigo  05/07/2020   Bronchitis 05/07/2020   Cardiac arrhythmia 05/07/2020   Chest discomfort 08/05/2017   Chest pain 05/07/2020   Chronic low back pain    Conjunctivitis of left eye 05/07/2020   Constipation 05/07/2020   Cough 09/23/2011   Followed in Pulmonary clinic/ [facility]/ [place]   - Sinus CT 09/25/2011 > neg    Diaphoresis 05/07/2020   Diarrhea 05/07/2020   Diverticular disease of colon 05/07/2020   Diverticulitis    Diverticulitis of colon 05/07/2020   DJD (degenerative joint disease) of knee    Eczema 05/07/2020   Encounter for general adult medical examination without abnormal findings 05/07/2020   Epistaxis 05/07/2020   Essential hypertension 08/05/2017   Fever 05/07/2020   Gastro-esophageal reflux disease without esophagitis 05/07/2020   Graves' disease 05/07/2020   Hearing loss 05/07/2020   History of diverticulitis 05/07/2020   Hypercholesteremia    Hypertension    Hyperthyroidism 05/07/2020   Hypokalemia 06/24/2018   Incarcerated incisional hernia s/p primary repair 06/24/2018 06/24/2018   Incontinence without sensory awareness 05/07/2020   Insomnia 05/07/2020   Left lower quadrant pain 05/07/2020   Loss of appetite 05/07/2020   Low back pain 05/07/2020   Mixed dyslipidemia 08/05/2017   Nasal crusting 05/07/2020   Neck pain 05/07/2020   Neurodermatitis    Noninflammatory disorder of vagina 05/07/2020   Obesity    Osteopenia    Other long term (current) drug therapy 05/07/2020   Pain in left hip 03/16/2019   Pain in right hand 05/07/2020   Postnasal drip 05/07/2020   Problem related to social environment 05/07/2020   Pure hypercholesterolemia 05/07/2020   PVC (premature ventricular contraction) 08/05/2017   Rash 05/07/2020   Recurrent UTI  Right hip pain 05/07/2020   Sciatica 05/07/2020   Skin sensation disturbance 05/07/2020   Sleep disorder 05/07/2020   Small bowel obstruction, partial, s/p lap adhesiolysis 06/24/2018 06/24/2018   Spasm 05/07/2020   Thoracic aortic aneurysm [facility])     Thoracic aortic aneurysm without rupture ([place]) 05/07/2020   Thoracic aortic atherosclerosis ([place]) 01/11/2020   Thyrotoxicosis 05/07/2020   Transient global amnesia    Urinary tract infectious disease 05/07/2020   Viral syndrome 05/07/2020   Vitamin D deficiency 05/07/2020   Weight loss 05/07/2020    Past Surgical History:  Procedure Laterality Date   APPENDECTOMY     BIOPSY  01/30/2022   Procedure: BIOPSY;  Surgeon: Wilford Corner, MD;  Location: [facility];  Service: Gastroenterology;;   COLON RESECTION SIGMOID  2004   Dr Dalbert Batman   ESOPHAGOGASTRODUODENOSCOPY (EGD) WITH PROPOFOL N/A 01/30/2022   Procedure: ESOPHAGOGASTRODUODENOSCOPY (EGD) WITH PROPOFOL;  Surgeon: Wilford Corner, MD;  Location: [facility];  Service: Gastroenterology;  Laterality: N/A;   EXPLORATORY LAPAROTOMY  2004   r/o pancreas mass.  Dr Dalbert Batman   IR RADIOLOGIST EVAL & MGMT  07/14/2018   LAPAROSCOPIC APPENDECTOMY N/A 06/24/2018   Procedure: ACUTE GANGRENOUS APPENDICITIS WITH ABSCESS, PARTIAL SMALL BOWEL OBSTRUCTION, INCISIONAL HERNIA REPAIR, LYSIS OF ADHESIONS;  Surgeon: Michael Boston, MD;  Location: [facility];  Service: General;  Laterality: N/A;   TOTAL KNEE ARTHROPLASTY     bilaterallly     Current Hospital Medications:  Home meds:  No current facility-administered medications on file prior to encounter.   Current Outpatient Medications on File Prior to Encounter  Medication Sig Dispense Refill   aspirin EC 81 MG tablet Take 81 mg by mouth every Monday, Wednesday, and Friday. Swallow whole.     Budeson-Glycopyrrol-Formoterol (BREZTRI AEROSPHERE) 160-9-4.8 MCG/ACT AERO Inhale 1 puff into the lungs daily as needed (for flares).     cetirizine (ZYRTEC) 10 MG tablet Take 10 mg by mouth daily.     Cholecalciferol (VITAMIN D3) 25 MCG (1000 UT) CHEW Chew 1,000 Units by mouth daily.     citalopram (CELEXA) 20 MG tablet Take 1 tablet (20 mg total) by mouth daily. 90 tablet 0   clobetasol cream (TEMOVATE) 3.81 % Apply  1 Application topically 2 (two) times daily as needed (for irritation).     fesoterodine (TOVIAZ) 8 MG TB24 tablet TAKE 1 TABLET EVERY DAY (Patient taking differently: Take 8 mg by mouth at bedtime.) 90 tablet 3   furosemide (LASIX) 20 MG tablet Take 20 mg by mouth every Monday, Wednesday, and Friday.     levothyroxine (SYNTHROID) 25 MCG tablet Take 1 tablet (25 mcg total) by mouth 2 (two) times a week. (Patient taking differently: Take 25 mcg by mouth daily. Take with 50 mcg tablet for a total of 75 mcg.) 90 tablet 3   levothyroxine (SYNTHROID) 50 MCG tablet Take 50 mcg by mouth daily before breakfast. Take with 25 mcg tablet for a total of 75 mcg.     Multiple Vitamin (MULTI VITAMIN) TABS Take 1 tablet by mouth daily with breakfast.     pantoprazole (PROTONIX) 40 MG tablet Take 1 tablet (40 mg total) by mouth 2 (two) times daily before a meal. Take 1 pill twice daily for 30 days then continue once daily 90 tablet 0   potassium chloride SA (KLOR-CON M) 20 MEQ tablet Take 1 tablet (20 mEq total) by mouth 2 (two) times daily. 12 tablet 0   rosuvastatin (CRESTOR) 5 MG tablet Take 5 mg by mouth every Monday,  Wednesday, and Friday.     traMADol (ULTRAM) 50 MG tablet Take 1 tablet (50 mg total) by mouth every 12 (twelve) hours as needed for moderate pain. 15 tablet 0     Scheduled Meds:  citalopram  20 mg Oral Daily   fesoterodine  4 mg Oral QHS   levothyroxine  75 mcg Oral Q0600   pantoprazole  40 mg Oral Daily   Continuous Infusions:  sodium chloride 75 mL/hr at 03/13/22 1107   cefTRIAXone (ROCEPHIN)  IV     PRN Meds:.acetaminophen **OR** acetaminophen  Allergies:  Allergies  Allergen Reactions   Levofloxacin Other (See Comments)    Cardiac advised to avoid/Aneurysm   Codeine Nausea And Vomiting   Simvastatin Other (See Comments)    Dizziness    Sulfamethoxazole-Trimethoprim Other (See Comments)     Dyspepsia    Zestril [Lisinopril] Cough    Family History  Problem Relation Age  of Onset   Heart disease Father    Leukemia Mother     Social History:  reports that she has never smoked. She has never used smokeless tobacco. She reports that she does not drink alcohol and does not use drugs.  ROS: A complete review of systems was performed.  All systems are negative except for pertinent findings as noted.  Physical Exam:  Vital signs in last 24 hours: Temp:  [97.9 F (36.6 C)-98.8 F (37.1 C)] 98.4 F (36.9 C) (01/18 0451) Pulse Rate:  [70-79] 74 (01/18 0451) Resp:  [16-24] 17 (01/18 0451) BP: (109-129)/(54-77) 127/72 (01/18 0451) SpO2:  [91 %-98 %] 93 % (01/18 0451) Constitutional:  Alert and oriented, No acute distress Cardiovascular: Regular rate and rhythm Respiratory: Normal respiratory effort, Lungs clear bilaterally GI: Abdomen is soft, nontender, nondistended, no abdominal masses GU: No CVA tenderness Neurologic: Grossly intact, no focal deficits Psychiatric: Normal mood and affect  Laboratory Data:  Recent Labs    03/11/22 1213 03/12/22 1045 03/13/22 0503  WBC 14.4* 12.4* 11.5*  HGB 11.3* 11.3* 9.9*  HCT 34.4* 34.6* 29.8*  PLT 453.0* 491* 347    Recent Labs    03/11/22 1213 03/12/22 1045 03/12/22 2144 03/13/22 0503  NA 132* 131*  --  132*  K 2.8* 2.1* 2.9* 3.0*  CL 85* 85*  --  94*  GLUCOSE 125* 123*  --  99  BUN 41* 47*  --  48*  CALCIUM 9.4 9.1  --  8.2*  CREATININE 1.74* 2.01*  --  3.32*     Results for orders placed or performed during the hospital encounter of 03/12/22 (from the past 24 hour(s))  Urinalysis, Routine w reflex microscopic Urine, Clean Catch     Status: Abnormal   Collection Time: 03/12/22  1:04 PM  Result Value Ref Range   Color, Urine YELLOW YELLOW   APPearance TURBID (A) CLEAR   Specific Gravity, Urine >=1.030 1.005 - 1.030   pH 6.0 5.0 - 8.0   Glucose, UA NEGATIVE NEGATIVE mg/dL   Hgb urine dipstick MODERATE (A) NEGATIVE   Bilirubin Urine SMALL (A) NEGATIVE   Ketones, ur NEGATIVE NEGATIVE mg/dL    Protein, ur 100 (A) NEGATIVE mg/dL   Nitrite NEGATIVE NEGATIVE   Leukocytes,Ua LARGE (A) NEGATIVE  Urinalysis, Microscopic (reflex)     Status: Abnormal   Collection Time: 03/12/22  1:04 PM  Result Value Ref Range   RBC / HPF 6-10 0 - 5 RBC/hpf   WBC, UA >50 0 - 5 WBC/hpf   Bacteria, UA MANY (A) NONE SEEN  Squamous Epithelial / HPF 6-10 0 - 5 /HPF   WBC Clumps PRESENT   Sodium, urine, random     Status: None   Collection Time: 03/12/22  2:43 PM  Result Value Ref Range   Sodium, Ur <10 mmol/L  D-dimer, quantitative     Status: Abnormal   Collection Time: 03/12/22  9:44 PM  Result Value Ref Range   D-Dimer, Quant 4.57 (H) 0.00 - 0.50 ug/mL-FEU  Potassium     Status: Abnormal   Collection Time: 03/12/22  9:44 PM  Result Value Ref Range   Potassium 2.9 (L) 3.5 - 5.1 mmol/L  Osmolality     Status: None   Collection Time: 03/12/22  9:44 PM  Result Value Ref Range   Osmolality 288 275 - 295 mOsm/kg  Basic metabolic panel     Status: Abnormal   Collection Time: 03/13/22  5:03 AM  Result Value Ref Range   Sodium 132 (L) 135 - 145 mmol/L   Potassium 3.0 (L) 3.5 - 5.1 mmol/L   Chloride 94 (L) 98 - 111 mmol/L   CO2 28 22 - 32 mmol/L   Glucose, Bld 99 70 - 99 mg/dL   BUN 48 (H) 8 - 23 mg/dL   Creatinine, Ser 3.32 (H) 0.44 - 1.00 mg/dL   Calcium 8.2 (L) 8.9 - 10.3 mg/dL   GFR, Estimated 13 (L) >60 mL/min   Anion gap 10 5 - 15  CBC     Status: Abnormal   Collection Time: 03/13/22  5:03 AM  Result Value Ref Range   WBC 11.5 (H) 4.0 - 10.5 K/uL   RBC 3.46 (L) 3.87 - 5.11 MIL/uL   Hemoglobin 9.9 (L) 12.0 - 15.0 g/dL   HCT 29.8 (L) 36.0 - 46.0 %   MCV 86.1 80.0 - 100.0 fL   MCH 28.6 26.0 - 34.0 pg   MCHC 33.2 30.0 - 36.0 g/dL   RDW 15.3 11.5 - 15.5 %   Platelets 347 150 - 400 K/uL   nRBC 0.0 0.0 - 0.2 %  Heparin level (unfractionated)     Status: None   Collection Time: 03/13/22 11:17 AM  Result Value Ref Range   Heparin Unfractionated 0.30 0.30 - 0.70 IU/mL  T4, free      Status: Abnormal   Collection Time: 03/13/22 11:17 AM  Result Value Ref Range   Free T4 1.51 (H) 0.61 - 1.12 ng/dL   No results found for this or any previous visit (from the past 240 hour(s)).  Renal Function: Recent Labs    03/11/22 1213 03/12/22 1045 03/13/22 0503  CREATININE 1.74* 2.01* 3.32*   Estimated Creatinine Clearance: 9.5 mL/min (A) (by C-G formula based on SCr of 3.32 mg/dL (H)).  Radiologic Imaging: NM Pulmonary Perfusion  Result Date: 03/13/2022 CLINICAL DATA:  Pulmonary embolism suspected. High probability. Altered mental status. History of aortic aneurysm. EXAM: NUCLEAR MEDICINE PERFUSION LUNG SCAN TECHNIQUE: Perfusion images were obtained in multiple projections after intravenous injection of radiopharmaceutical. Ventilation scans intentionally deferred if perfusion scan and chest x-ray adequate for interpretation during COVID 19 epidemic. RADIOPHARMACEUTICALS:  4.0 mCi Tc-29mMAA IV COMPARISON:  No prior V/Q scan is available for comparison. Correlation is made to chest radiograph 03/12/2022 and 02/27/2015; CTA chest 03/06/2022 (performed for thoracic aortic aneurysm evaluation). FINDINGS: The cardiac defect is large, consistent with the moderate to high-grade cardiomegaly seen on prior radiographs and CT. There is a potential small perfusion defect overlying the inferolateral left lung, in the region of chronic scarring  seen on prior CT. No perfusion defect is seen in this region on left anterior oblique or lateral perfusion images. IMPRESSION: There is only a small potential defect seen within the inferolateral right lung, in the general region of the anteromedial basal segment of the left lower lobe, however this appears to be explained by matching chronic right costophrenic angle scarring in this same region on prior CT. Therefore, this is considered a very low probability perfusion nuclear medicine scan by the "modified perfusion only PIOPED II criteria." Electronically  Signed   By: Yvonne Kendall M.D.   On: 03/13/2022 11:38   VAS US RENAL ARTERY DUPLEX  Result Date: 03/13/2022 ABDOMINAL VISCERAL Patient Name:  TAUNYA GORAL  Date of Exam:   03/13/2022 Medical Rec #: [id number]         Accession #:    [id number] Date of Birth: [birth year]         Patient Gender: F Patient Age:   31 years Exam Location:  [facility] Procedure:      VAS US RENAL ARTERY DUPLEX Referring Phys: [id number] VASUNDHRA RATHORE -------------------------------------------------------------------------------- Indications: Concern for left renal stenosis on CTA chest/aorta High Risk Factors: Hypertension, hyperlipidemia. Other Factors: Significant hydronephrosis on US renal 03-12-2022, left > right. Limitations: Air/bowel gas and constant patient movement/altered mental status. Comparison Study: 03-06-2022 CTA chest/aorta shows new significant                   hypoenhancement of the visualized portion of the left kidney                   suggests interval occlusion of the left renal artery. Performing Technologist: Archie Patten RVS  Examination Guidelines: A complete evaluation includes B-mode imaging, spectral Doppler, color Doppler, and power Doppler as needed of all accessible portions of each vessel. Bilateral testing is considered an integral part of a complete examination. Limited examinations for reoccurring indications may be performed as noted.  Duplex Findings: +----------------------+--------+--------+------+--------+ Mesenteric            PSV cm/sEDV cm/sPlaqueComments +----------------------+--------+--------+------+--------+ Aorta at SMA            202                          +----------------------+--------+--------+------+--------+ Celiac Artery Proximal  109                          +----------------------+--------+--------+------+--------+ SMA Proximal            200                          +----------------------+--------+--------+------+--------+     +------------------+--------+--------+-------+ Right Renal ArteryPSV cm/sEDV cm/sComment +------------------+--------+--------+-------+ Origin               70      13           +------------------+--------+--------+-------+ Proximal             53      11           +------------------+--------+--------+-------+ Mid                  46      13           +------------------+--------+--------+-------+ Distal               60  18           +------------------+--------+--------+-------+ +-----------------+--------+--------+-------+ Left Renal ArteryPSV cm/sEDV cm/sComment +-----------------+--------+--------+-------+ Origin              47      10           +-----------------+--------+--------+-------+ Proximal            54      9            +-----------------+--------+--------+-------+ Mid                 50      15           +-----------------+--------+--------+-------+ Distal              53      12           +-----------------+--------+--------+-------+ +------------+--------+--------+----+-----------+--------+--------+----+ Right KidneyPSV cm/sEDV cm/sRI  Left KidneyPSV cm/sEDV cm/sRI   +------------+--------+--------+----+-----------+--------+--------+----+ Upper Pole  27      11      0.58Upper Pole 16      5       0.71 +------------+--------+--------+----+-----------+--------+--------+----+ Mid         14      6       0.61Mid        13      5       0.64 +------------+--------+--------+----+-----------+--------+--------+----+ Lower Pole  20      9       0.57Lower Pole 14      4       0.72 +------------+--------+--------+----+-----------+--------+--------+----+ Hilar       58      13      0.78Hilar      43      7       0.84 +------------+--------+--------+----+-----------+--------+--------+----+ +------------------+-----+------------------+-----+ Right Kidney           Left Kidney              +------------------+-----+------------------+-----+ RAR                    RAR                     +------------------+-----+------------------+-----+ RAR (manual)      0.35 RAR (manual)      0.27  +------------------+-----+------------------+-----+ Cortex                 Cortex                  +------------------+-----+------------------+-----+ Cortex thickness       Corex thickness         +------------------+-----+------------------+-----+ Kidney length (cm)11.40Kidney length (cm)11.90 +------------------+-----+------------------+-----+   Summary: Renal:  Right: Normal right Resisitive Index. No evidence of right renal        artery stenosis. Normal size right kidney. Left:  Abnormal left Resisitve Index. No evidence of left renal        artery stenosis. Normal size of left kidney. Left kidney        perfusion appears decreased as compared to right with limited        visualization of the internal vasculature secondary to        significant collecting system dilitation, and low flow        velocities as compared to the right kidney.  *See table(s) above for measurements and observations.     Preliminary    CT Renal Stone Study  Result Date: 03/12/2022 CLINICAL  DATA:  Bladder neck obstruction EXAM: CT ABDOMEN AND PELVIS WITHOUT CONTRAST TECHNIQUE: Multidetector CT imaging of the abdomen and pelvis was performed following the standard protocol without IV contrast. RADIATION DOSE REDUCTION: This exam was performed according to the departmental dose-optimization program which includes automated exposure control, adjustment of the mA and/or kV according to patient size and/or use of iterative reconstruction technique. COMPARISON:  Renal ultrasound earlier 03/12/2022.  CT 01/31/2022. FINDINGS: Lower chest: Breathing motion at the lung bases. Calcified nodule at the left lower lobe consistent with old granulomatous disease. Mild dependent atelectasis. No effusion. The heart is enlarged.  Small pericardial effusion. Coronary artery calcifications are seen. Left hilar calcified lymph nodes noted at the edge of the imaging field. Hepatobiliary: On this non IV contrast exam, the liver is grossly preserved. Gallbladder is distended with sludge and stones Pancreas: Mild pancreatic atrophy. Stable mild pancreatic duct ectasia. Spleen: Spleen is nonenlarged.  Splenic granulomas. Adrenals/Urinary Tract: Adrenal glands are preserved. There is severe left and moderate severe right renal collecting system dilatation with perinephric stranding. No obstructing renal stones. Previously on the contrast study there was a focal stricture identified along the proximal and mid ureter on the left which is again suggested today. On the right there is a caliber change as well the mid ureter, level of the iliac vessels. Etiology is uncertain otherwise recommend further evaluation. Preserved contours of the urinary bladder with some luminal air. Stomach/Bowel: On this non oral contrast exam large bowel is of normal course and caliber with scattered stool. Scattered colonic diverticula. Surgical changes along the base of the cecum. There is moderate debris in the stomach. The small bowel grossly normal course and caliber. Vascular/Lymphatic: Normal caliber aorta and IVC with some scattered calcified plaque. There is slightly tortuous course of the mid abdominal aorta. Reproductive: Calcifications along the uterus. There is a large pessary device in place. No separate adnexal mass on this noncontrast study. Other: Anasarca. No significant ascites. No obvious free air. There is some mild stranding of the upper retroperitoneum near the course of the duodenal, unchanged from the prior examination. This includes along the course of the left renal vein. There is some soft tissue thickening as well. Musculoskeletal: Screws along the left femoral neck are identified. Diffuse degenerative changes along the pelvis and spine with  curvature of the spine. Mild compression deformities asymmetric at L2 and T12 are noted. IMPRESSION: As seen on the ultrasound there is increasing collecting system dilatation bilaterally with some retroperitoneal stranding there is focal caliber change identified along the course of the mid ureters bilaterally. This would have a broad differential. In addition there is soft tissue thickening and stranding in the upper retroperitoneum near the third portion of the duodenal in the left renal vein. Recommend additional contrast examination including delays to better define this area and the aforementioned abnormality of the kidneys. Long renal delays and imaging of the ureters may be of some benefit. Distended gallbladder with stones and sludge. Colonic diverticula. Pessary. Electronically Signed   By: Jill Side M.D.   On: 03/12/2022 17:22   US Renal  Result Date: 03/12/2022 CLINICAL DATA:  Hydronephrosis.  Renal insufficiency EXAM: RENAL / URINARY TRACT ULTRASOUND COMPLETE COMPARISON:  CT 01/31/2022 and older.  Ultrasound 2014. FINDINGS: Right Kidney: Renal measurements: 11.4 x 4.7 x 5.1 = volume: 142 mL. Moderate collecting system dilatation. No perinephric stranding. This is new from prior. There is a small echogenic area towards the lower pole of the right kidney measuring 13  mm consistent with known fat containing lesion on CT scan, angiomyolipoma Left Kidney: Renal measurements: 11.6 x 6.3 x 5.4 = volume: 207.2 mL. There is moderate to severe left-sided renal collecting system dilatation, similar to prior CT. No perinephric stranding. Bladder: Underdistended urinary bladder.  Preserved contour. Other: Limited visualization of the aorta and IVC IMPRESSION: Bilateral collecting system dilatation, more severe on the left than right. Left is similar to prior CT but the right is progressive. Please correlate for etiology. 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 Electronically Signed    By: Jill Side M.D.   On: 03/12/2022 13:59   DG Chest 2 View  Result Date: 03/12/2022 CLINICAL DATA:  Bibasilar rales, abnormal labs. EXAM: CHEST - 2 VIEW COMPARISON:  08/04/2019 and CT chest 03/06/2022. FINDINGS: Trachea is midline. Heart is enlarged, stable. Thoracic aorta is calcified. Calcified left lower lobe granuloma. Minimal bibasilar subsegmental volume loss. No airspace consolidation or pleural fluid. T8 and L1 compression fractures. IMPRESSION: 1. No acute findings. 2.  Aortic atherosclerosis (ICD10-I70.0). Electronically Signed   By: Lorin Picket M.D.   On: 03/12/2022 12:42   CT Head Wo Contrast  Result Date: 03/12/2022 CLINICAL DATA:  Altered mental status for 2 weeks EXAM: CT HEAD WITHOUT CONTRAST TECHNIQUE: Contiguous axial images were obtained from the base of the skull through the vertex without intravenous contrast. RADIATION DOSE REDUCTION: This exam was performed according to the departmental dose-optimization program which includes automated exposure control, adjustment of the mA and/or kV according to patient size and/or use of iterative reconstruction technique. COMPARISON:  Brain MRI 06/06/2009 FINDINGS: Brain: No evidence of acute infarction, hemorrhage, hydrocephalus, extra-axial collection or mass lesion/mass effect. Generalized cortical atrophy, moderate to advanced. Chronic small vessel ischemia in the deep cerebral white matter. Vascular: No hyperdense vessel or unexpected calcification. Skull: Normal. Negative for fracture or focal lesion. Sinuses/Orbits: No acute finding. IMPRESSION: Generalized involution of the brain without acute superimposed finding. Electronically Signed   By: Jorje Guild M.D.   On: 03/12/2022 12:22    I independently reviewed the above imaging studies.  Impression/Recommendation:  #1.  Severe bilateral hydronephrosis: CT A/P 03/12/2022 with severe bilateral hydronephrosis to the level of the mid ureter. 2.  AKI: Creatinine 3.3 on day of  consultation from baseline of 1 3.  Bacteriuria: Urinalysis with bacteria present.  Asymptomatic.  Urine culture pending.  -Given severe bilateral hydronephrosis with worsening AKI with creatinine 3.3 and GFR less than 20, recommend cystoscopy, bilateral retrograde pyelogram, bilateral stent placement.  Will attempt diagnostic bilateral ureteroscopy, possible biopsy today. -Notified Dr. Diona Fanti of her admission  Matt R. Kamerin Grumbine MD 03/13/2022, 12:56 PM  [facility]  Pager: [phone]   CC: Dr. Marlowe Sax, MD

## 2022-03-13 NOTE — Progress Notes (Signed)
ANTICOAGULATION CONSULT NOTE - Initial Consult  Pharmacy Consult for heparin Indication:  r/o VTE  Allergies  Allergen Reactions   Levofloxacin Other (See Comments)    Cardiac advised to avoid/Aneurysm   Codeine Nausea And Vomiting   Simvastatin Other (See Comments)    Dizziness    Sulfamethoxazole-Trimethoprim Other (See Comments)     Dyspepsia    Zestril [Lisinopril] Cough    Patient Measurements: Height: '5\' 3"'$  (160 cm) Weight: 61 kg (134 lb 6.4 oz) IBW/kg (Calculated) : 52.4  Vital Signs: Temp: 98.3 F (36.8 C) (01/17 2045) Temp Source: Oral (01/17 2045) BP: 113/62 (01/17 2045) Pulse Rate: 72 (01/17 2045)  Labs: Recent Labs    03/11/22 1213 03/12/22 1045  HGB 11.3* 11.3*  HCT 34.4* 34.6*  PLT 453.0* 491*  CREATININE 1.74* 2.01*    Estimated Creatinine Clearance: 15.7 mL/min (A) (by C-G formula based on SCr of 2.01 mg/dL (H)).   Medical History: Past Medical History:  Diagnosis Date   Acute bronchitis 05/07/2020   Acute gangrenous appendicitis s/p lap appendectomy 06/24/2018 06/23/2018   Adjustment disorder 05/07/2020   Allergic bronchitis 05/07/2020   Allergic rhinitis 05/07/2020   Aortic regurgitation 05/10/2019   Aortic root enlargement ([place]) 08/19/2017   Appendicitis 05/07/2020   Ascending aorta dilation ([facility]) 08/19/2017   Benign paroxysmal positional vertigo 05/07/2020   Bronchitis 05/07/2020   Cardiac arrhythmia 05/07/2020   Chest discomfort 08/05/2017   Chest pain 05/07/2020   Chronic low back pain    Conjunctivitis of left eye 05/07/2020   Constipation 05/07/2020   Cough 09/23/2011   Followed in Pulmonary clinic/ [facility]/ [place]   - Sinus CT 09/25/2011 > neg    Diaphoresis 05/07/2020   Diarrhea 05/07/2020   Diverticular disease of colon 05/07/2020   Diverticulitis    Diverticulitis of colon 05/07/2020   DJD (degenerative joint disease) of knee    Eczema 05/07/2020   Encounter for general adult medical examination without abnormal findings 05/07/2020    Epistaxis 05/07/2020   Essential hypertension 08/05/2017   Fever 05/07/2020   Gastro-esophageal reflux disease without esophagitis 05/07/2020   Graves' disease 05/07/2020   Hearing loss 05/07/2020   History of diverticulitis 05/07/2020   Hypercholesteremia    Hypertension    Hyperthyroidism 05/07/2020   Hypokalemia 06/24/2018   Incarcerated incisional hernia s/p primary repair 06/24/2018 06/24/2018   Incontinence without sensory awareness 05/07/2020   Insomnia 05/07/2020   Left lower quadrant pain 05/07/2020   Loss of appetite 05/07/2020   Low back pain 05/07/2020   Mixed dyslipidemia 08/05/2017   Nasal crusting 05/07/2020   Neck pain 05/07/2020   Neurodermatitis    Noninflammatory disorder of vagina 05/07/2020   Obesity    Osteopenia    Other long term (current) drug therapy 05/07/2020   Pain in left hip 03/16/2019   Pain in right hand 05/07/2020   Postnasal drip 05/07/2020   Problem related to social environment 05/07/2020   Pure hypercholesterolemia 05/07/2020   PVC (premature ventricular contraction) 08/05/2017   Rash 05/07/2020   Recurrent UTI    Right hip pain 05/07/2020   Sciatica 05/07/2020   Skin sensation disturbance 05/07/2020   Sleep disorder 05/07/2020   Small bowel obstruction, partial, s/p lap adhesiolysis 06/24/2018 06/24/2018   Spasm 05/07/2020   Thoracic aortic aneurysm [facility])    Thoracic aortic aneurysm without rupture ([place]) 05/07/2020   Thoracic aortic atherosclerosis ([place]) 01/11/2020   Thyrotoxicosis 05/07/2020   Transient global amnesia    Urinary tract infectious disease 05/07/2020  Viral syndrome 05/07/2020   Vitamin D deficiency 05/07/2020   Weight loss 05/07/2020    Medications:  Medications Prior to Admission  Medication Sig Dispense Refill Last Dose   aspirin EC 81 MG tablet Take 81 mg by mouth every Monday, Wednesday, and Friday. Swallow whole.   03/10/2022   Budeson-Glycopyrrol-Formoterol (BREZTRI AEROSPHERE) 160-9-4.8 MCG/ACT AERO Inhale 1 puff into the lungs daily as  needed (for flares).   unknown   cetirizine (ZYRTEC) 10 MG tablet Take 10 mg by mouth daily.   03/11/2022   Cholecalciferol (VITAMIN D3) 25 MCG (1000 UT) CHEW Chew 1,000 Units by mouth daily.   03/11/2022   citalopram (CELEXA) 20 MG tablet Take 1 tablet (20 mg total) by mouth daily. 90 tablet 0 03/11/2022   clobetasol cream (TEMOVATE) 8.25 % Apply 1 Application topically 2 (two) times daily as needed (for irritation).   unknown   fesoterodine (TOVIAZ) 8 MG TB24 tablet TAKE 1 TABLET EVERY DAY (Patient taking differently: Take 8 mg by mouth at bedtime.) 90 tablet 3 03/11/2022   furosemide (LASIX) 20 MG tablet Take 20 mg by mouth every Monday, Wednesday, and Friday.   03/10/2022   levothyroxine (SYNTHROID) 25 MCG tablet Take 1 tablet (25 mcg total) by mouth 2 (two) times a week. (Patient taking differently: Take 25 mcg by mouth daily. Take with 50 mcg tablet for a total of 75 mcg.) 90 tablet 3 03/12/2022   levothyroxine (SYNTHROID) 50 MCG tablet Take 50 mcg by mouth daily before breakfast. Take with 25 mcg tablet for a total of 75 mcg.   03/12/2022   Multiple Vitamin (MULTI VITAMIN) TABS Take 1 tablet by mouth daily with breakfast.   03/11/2022   pantoprazole (PROTONIX) 40 MG tablet Take 1 tablet (40 mg total) by mouth 2 (two) times daily before a meal. Take 1 pill twice daily for 30 days then continue once daily 90 tablet 0 03/11/2022   potassium chloride SA (KLOR-CON M) 20 MEQ tablet Take 1 tablet (20 mEq total) by mouth 2 (two) times daily. 12 tablet 0 03/11/2022   rosuvastatin (CRESTOR) 5 MG tablet Take 5 mg by mouth every Monday, Wednesday, and Friday.   03/10/2022   traMADol (ULTRAM) 50 MG tablet Take 1 tablet (50 mg total) by mouth every 12 (twelve) hours as needed for moderate pain. 15 tablet 0 unknown   Scheduled:   citalopram  20 mg Oral Daily   fesoterodine  8 mg Oral QHS   heparin  5,000 Units Subcutaneous Q8H   levothyroxine  75 mcg Oral Q0600   pantoprazole  40 mg Oral Daily   potassium chloride   40 mEq Oral Once   rosuvastatin  5 mg Oral Q M,W,F   Infusions:   sodium chloride 75 mL/hr at 03/12/22 2159   cefTRIAXone (ROCEPHIN)  IV      Assessment: 89yo female was sent to ED by PCP for abnormal labs; during further w/u D-dimer was found to be elevated, awaiting VQ scan for possible PE and renal US for possible renal artery occlusion >> to begin heparin.  Goal of Therapy:  Heparin level 0.3-0.7 units/ml Monitor platelets by anticoagulation protocol: Yes   Plan:  Rec'd SQ UFH ~4h ago; will give partial heparin bolus of 2000 units followed by infusion at 1000 units/hr. Monitor heparin levels and CBC.  Wynona Neat, PharmD, BCPS  03/13/2022,2:04 AM

## 2022-03-13 NOTE — Hospital Course (Addendum)
Ms. Goyne is an 89 yo female with PMH ascending thoracic aortic fusiform aneurysm, left hydronephrosis, HTN, diverticulosis, hypothyroidism, HLD, GERD, PVCs, depression/anxiety.  She was admitted last month 12/5-12/13 for melena and had EGD done which showed evidence of gastritis and gastric stenosis at the pylorus.  CT abdomen pelvis was negative for any extrinsic causes of gastric stenosis.  She was discharged on PPI twice daily for a month and then daily.  She was treated for possible UTI and CT was concerning for left hydroureteronephrosis.  She was seen by urology and initially had Foley catheter placed but later passed passed voiding trial, plan was outpatient follow-up.  She was scheduled for cystoscopy and stent placement with urology next week.    Patient with history of ascending thoracic aortic fusiform aneurysm initially discovered in 2019 and followed by cardiothoracic surgery.  She had a recent CTA of her thoracic aorta performed on 03/06/2022 which was showing a very slight enlargement of the diameter of the ascending aorta now to 5.5 cm.  Patient had a follow-up visit with cardiothoracic surgery on 03/10/2022 and CTA findings of ascending thoracic aortic aneurysm felt to be basically unchanged.  CT did show findings worrisome for occlusion of the left renal artery.   Seen by PCP yesterday for fatigue, generalized weakness, poor appetite, and weight loss.  Hemoglobin stable and occult stool test in the office was negative for blood.  Labs revealed AKI (creatinine 1.7, baseline 0.8), hypokalemia (potassium 2.8), WBC count 14.4.  Patient sent to the ED for further evaluation.   In the ED, SpO2 87% on room air and placed on 3 L O2.  Afebrile.  Labs significant for WBC 12.4 (improved), hemoglobin 11.3 (stable), creat 2.01. UA also concerning for infection.  CT renal stone study was performed during workup in the ER which showed increasing bilateral hydronephrosis.  She was started on  Rocephin.  Vascular surgery and urology were consulted on admission. She underwent cystoscopy on 03/13/2022 with bilateral stent placement. Renal function slowly improved after stent placement.  Foley catheter was removed on 03/17/2022 for a trial of void and this was achieved successfully.

## 2022-03-13 NOTE — Progress Notes (Signed)
Renal artery duplex has been completed.   Preliminary results in CV Proc.   Latoya Curry 03/13/2022 9:59 AM

## 2022-03-13 NOTE — Progress Notes (Signed)
Dinner tray ordered.

## 2022-03-14 ENCOUNTER — Encounter (HOSPITAL_COMMUNITY): Payer: Self-pay | Admitting: Urology

## 2022-03-14 DIAGNOSIS — R410 Disorientation, unspecified: Secondary | ICD-10-CM

## 2022-03-14 DIAGNOSIS — N179 Acute kidney failure, unspecified: Secondary | ICD-10-CM | POA: Diagnosis not present

## 2022-03-14 DIAGNOSIS — N1330 Unspecified hydronephrosis: Secondary | ICD-10-CM | POA: Diagnosis not present

## 2022-03-14 DIAGNOSIS — J9601 Acute respiratory failure with hypoxia: Secondary | ICD-10-CM | POA: Diagnosis not present

## 2022-03-14 LAB — CBC WITH DIFFERENTIAL/PLATELET
Abs Immature Granulocytes: 0.05 10*3/uL (ref 0.00–0.07)
Basophils Absolute: 0.1 10*3/uL (ref 0.0–0.1)
Basophils Relative: 1 %
Eosinophils Absolute: 0.3 10*3/uL (ref 0.0–0.5)
Eosinophils Relative: 3 %
HCT: 29.1 % — ABNORMAL LOW (ref 36.0–46.0)
Hemoglobin: 9.5 g/dL — ABNORMAL LOW (ref 12.0–15.0)
Immature Granulocytes: 0 %
Lymphocytes Relative: 10 %
Lymphs Abs: 1.2 10*3/uL (ref 0.7–4.0)
MCH: 28.4 pg (ref 26.0–34.0)
MCHC: 32.6 g/dL (ref 30.0–36.0)
MCV: 86.9 fL (ref 80.0–100.0)
Monocytes Absolute: 0.6 10*3/uL (ref 0.1–1.0)
Monocytes Relative: 5 %
Neutro Abs: 9.7 10*3/uL — ABNORMAL HIGH (ref 1.7–7.7)
Neutrophils Relative %: 81 %
Platelets: 321 10*3/uL (ref 150–400)
RBC: 3.35 MIL/uL — ABNORMAL LOW (ref 3.87–5.11)
RDW: 15.5 % (ref 11.5–15.5)
WBC: 11.9 10*3/uL — ABNORMAL HIGH (ref 4.0–10.5)
nRBC: 0 % (ref 0.0–0.2)

## 2022-03-14 LAB — COMPREHENSIVE METABOLIC PANEL
ALT: 10 U/L (ref 0–44)
AST: 17 U/L (ref 15–41)
Albumin: 2.3 g/dL — ABNORMAL LOW (ref 3.5–5.0)
Alkaline Phosphatase: 40 U/L (ref 38–126)
Anion gap: 10 (ref 5–15)
BUN: 40 mg/dL — ABNORMAL HIGH (ref 8–23)
CO2: 26 mmol/L (ref 22–32)
Calcium: 8.3 mg/dL — ABNORMAL LOW (ref 8.9–10.3)
Chloride: 98 mmol/L (ref 98–111)
Creatinine, Ser: 2.93 mg/dL — ABNORMAL HIGH (ref 0.44–1.00)
GFR, Estimated: 15 mL/min — ABNORMAL LOW (ref >60)
Glucose, Bld: 85 mg/dL (ref 70–99)
Potassium: 3.7 mmol/L (ref 3.5–5.1)
Sodium: 134 mmol/L — ABNORMAL LOW (ref 135–145)
Total Bilirubin: 0.6 mg/dL (ref 0.3–1.2)
Total Protein: 5.4 g/dL — ABNORMAL LOW (ref 6.5–8.1)

## 2022-03-14 LAB — URINE CULTURE: Culture: >=100000 — AB

## 2022-03-14 LAB — T3: T3, Total: 62 ng/dL — ABNORMAL LOW (ref 71–180)

## 2022-03-14 LAB — MAGNESIUM: Magnesium: 1.9 mg/dL (ref 1.7–2.4)

## 2022-03-14 MED ORDER — HEPARIN SODIUM (PORCINE) 5000 UNIT/ML IJ SOLN
5000.0000 [IU] | Freq: Three times a day (TID) | INTRAMUSCULAR | Status: DC
  Administered 2022-03-14 – 2022-03-18 (×13): 5000 [IU] via SUBCUTANEOUS
  Filled 2022-03-14 (×13): qty 1

## 2022-03-14 NOTE — Plan of Care (Signed)
  Problem: Education: Goal: Knowledge of General Education information will improve Description: Including pain rating scale, medication(s)/side effects and non-pharmacologic comfort measures Outcome: Progressing   Problem: Coping: Goal: Level of anxiety will decrease Outcome: Progressing   Problem: Safety: Goal: Ability to remain free from injury will improve Outcome: Progressing   

## 2022-03-14 NOTE — Progress Notes (Signed)
Progress Note    Latoya Curry   CBJ:[id number]  DOB: 1930-12-17  DOA: 03/12/2022     2 PCP: [name], NP  Initial CC: increasing creatinine   Hospital Course: Latoya Curry is an 89 yo female with PMH ascending thoracic aortic fusiform aneurysm, left hydronephrosis, HTN, diverticulosis, hypothyroidism, HLD, GERD, PVCs, depression/anxiety.  She was admitted last month 12/5-12/13 for melena and had EGD done which showed evidence of gastritis and gastric stenosis at the pylorus.  CT abdomen pelvis was negative for any extrinsic causes of gastric stenosis.  She was discharged on PPI twice daily for a month and then daily.  She was treated for possible UTI and CT was concerning for left hydroureteronephrosis.  She was seen by urology and initially had Foley catheter placed but later passed passed voiding trial, plan was outpatient follow-up.  She was scheduled for cystoscopy and stent placement with urology next week.    Latoya Curry with history of ascending thoracic aortic fusiform aneurysm initially discovered in 2019 and followed by cardiothoracic surgery.  She had a recent CTA of her thoracic aorta performed on 03/06/2022 which was showing a very slight enlargement of the diameter of the ascending aorta now to 5.5 cm.  Latoya Curry had a follow-up visit with cardiothoracic surgery on 03/10/2022 and CTA findings of ascending thoracic aortic aneurysm felt to be basically unchanged.  CT did show findings worrisome for occlusion of the left renal artery.   Seen by PCP yesterday for fatigue, generalized weakness, poor appetite, and weight loss.  Hemoglobin stable and occult stool test in the office was negative for blood.  Labs revealed AKI (creatinine 1.7, baseline 0.8), hypokalemia (potassium 2.8), WBC count 14.4.  Latoya Curry sent to the ED for further evaluation.   In the ED, SpO2 87% on room air and placed on 3 L O2.  Afebrile.  Labs significant for WBC 12.4 (improved), hemoglobin 11.3 (stable), creat  2.01. UA also concerning for infection.  CT renal stone study was performed during workup in the ER which showed increasing bilateral hydronephrosis.  She was started on Rocephin.  Vascular surgery and urology were consulted on admission. She underwent cystoscopy on 03/13/2022 with bilateral stent placement.  Interval History:  No events overnight.  She is delirious this morning.  Daughter was arriving during evaluation as well and update given with questions answered.  Assessment and Plan:  AKI -CT done last month concerning for left hydroureteronephrosis and per chart Latoya Curry was scheduled for cystoscopy and stent placement with urology next week. -Repeat CT showing increasing collecting system dilatation bilaterally with some retroperitoneal stranding there is focal caliber change identified along the course of the mid ureters bilaterally. -Renal ultrasound showing bilateral collecting system dilatation, more severe on the left than right. Left is similar to prior CT but the right is progressive. 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 -Creatinine 2.01 on admission, baseline 0.8 on labs done 5 weeks ago and has been increasing since then - continue IVF - now has foley s/p cystoscopy  - some improvement in renal function today after stents and fluids - continue trending    UTI -WBC count mildly elevated and improved since yesterday.  No fever or signs of sepsis. -UA with evidence of pyuria and bacteriuria. - purulent drainage noted from left side once stent placed -Continue ceftriaxone -Urine culture growing E. coli, follow-up sensitivities  Concern for possible left renal artery occlusion -Vascular surgery consulted given concern for possible left renal  artery occlusion per CTA done 03/06/2022.   -Renal duplex obtained after admission.  Abnormal left resistive index, no evidence of left renal artery stenosis.  Report mentions "left kidney  perfusion appears decreased as compared to right with limited visualization of internal vasculature secondary to significant collecting system dilatation" -Follow-up vascular surgery evaluation and recommendations; no need for anticoagulation from vascular surgery standpoint after discussion -Possible plan for angiogram during hospitalization depending on how renal function responds to stent placement and fluids  Hypokalemia -Replete as needed   Acute hypoxemic respiratory failure SpO2 87% on room air in the ED, currently satting well on 2 L.  Latoya Curry does not use home oxygen.  Chest x-ray negative for acute finding.  Lungs clear on exam.  PE less likely given no tachycardia, chest pain, or clinical signs of DVT. - d-dimer positive on admission; VQ scan obtained and is "very low probability" - d/c heparin drip - etiology of brief hypoxia possibly in setting of atelectasis (mild bibasilar atelectasis noted on CT even) -Wean off oxygen as able   Mild hyponatremia -Continue gentle IV fluid hydration and monitor labs   Ascending thoracic aortic aneurysm -She had a recent CTA of her thoracic aorta performed on 03/06/2022 which was showing a very slight enlargement of the diameter of the ascending aorta now to 5.5 cm.  Latoya Curry had a follow-up visit with cardiothoracic surgery on 03/10/2022 and CTA findings of ascending thoracic aortic aneurysm felt to be basically unchanged. -Outpatient cardiothoracic surgery follow-up.   Acute delirium - suspect multifactorial with infection, sedation, advanced age, and venue change - continue delirium precautions; reorient as needed  Hypertension -Currently normotensive -Hold Lasix given AKI   GERD/gastritis -Continue Protonix   Hypothyroidism TSH 6.59, improved since labs done 2 weeks ago when it was 9.95 - FT4 is elevated however, 1.51 - will clarify if any dose change recently in synthroid before adjusting - continue current dose for now - if  nothing else, will need repeat testing again in ~4 weeks  Asymptomatic cholelithiasis -Incidental finding noted on CT renal: distended gallbladder with stones and sludge -Currently no elevation in LFTs nor signs of obstruction and Latoya Curry remains asymptomatic -Continue observation.  No further workup at this time   Hyperlipidemia -hold Crestor   Depression and anxiety -Continue citalopram   Old records reviewed in assessment of this Latoya Curry  Antimicrobials: Rocephin 03/12/2022 >> current  DVT prophylaxis:  HSQ  Code Status:   Code Status: DNR  Mobility Assessment (last 72 hours)     Mobility Assessment     Row Name 03/14/22 0800 03/13/22 1100 03/12/22 2030       Does Latoya Curry have an order for bedrest or is Latoya Curry medically unstable No - Continue assessment No - Continue assessment No - Continue assessment     What is the highest level of mobility based on the progressive mobility assessment? Level 6 (Walks independently in room and hall) - Balance while walking in room without assist - Complete Level 6 (Walks independently in room and hall) - Balance while walking in room without assist - Complete Level 6 (Walks independently in room and hall) - Balance while walking in room without assist - Complete              Barriers to discharge:  Disposition Plan: Home 2 to 3 days Status is: Inpatient  Objective: Blood pressure 114/67, pulse 74, temperature (!) 97.5 F (36.4 C), temperature source Oral, resp. rate 16, height '5\' 3"'$  (1.6 m), weight 61  kg, SpO2 98 %.  Examination:  Physical Exam Constitutional:      Appearance: Normal appearance.     Comments: More confused this morning  HENT:     Head: Normocephalic and atraumatic.     Mouth/Throat:     Mouth: Mucous membranes are moist.  Eyes:     Extraocular Movements: Extraocular movements intact.  Cardiovascular:     Rate and Rhythm: Normal rate and regular rhythm.  Pulmonary:     Effort: Pulmonary effort is  normal.     Breath sounds: Normal breath sounds.  Abdominal:     General: Bowel sounds are normal. There is no distension.     Palpations: Abdomen is soft.     Tenderness: There is no abdominal tenderness.  Musculoskeletal:        General: No swelling. Normal range of motion.     Cervical back: Normal range of motion and neck supple.  Skin:    General: Skin is warm and dry.  Neurological:     General: No focal deficit present.     Mental Status: She is alert. She is disoriented.  Psychiatric:        Mood and Affect: Mood normal.      Consultants:  Urology Vascular surgery  Procedures:    Data Reviewed: Results for orders placed or performed during the hospital encounter of 03/12/22 (from the past 24 hour(s))  CBC with Differential/Platelet     Status: Abnormal   Collection Time: 03/14/22  3:52 AM  Result Value Ref Range   WBC 11.9 (H) 4.0 - 10.5 K/uL   RBC 3.35 (L) 3.87 - 5.11 MIL/uL   Hemoglobin 9.5 (L) 12.0 - 15.0 g/dL   HCT 29.1 (L) 36.0 - 46.0 %   MCV 86.9 80.0 - 100.0 fL   MCH 28.4 26.0 - 34.0 pg   MCHC 32.6 30.0 - 36.0 g/dL   RDW 15.5 11.5 - 15.5 %   Platelets 321 150 - 400 K/uL   nRBC 0.0 0.0 - 0.2 %   Neutrophils Relative % 81 %   Neutro Abs 9.7 (H) 1.7 - 7.7 K/uL   Lymphocytes Relative 10 %   Lymphs Abs 1.2 0.7 - 4.0 K/uL   Monocytes Relative 5 %   Monocytes Absolute 0.6 0.1 - 1.0 K/uL   Eosinophils Relative 3 %   Eosinophils Absolute 0.3 0.0 - 0.5 K/uL   Basophils Relative 1 %   Basophils Absolute 0.1 0.0 - 0.1 K/uL   Immature Granulocytes 0 %   Abs Immature Granulocytes 0.05 0.00 - 0.07 K/uL  Magnesium     Status: None   Collection Time: 03/14/22  3:52 AM  Result Value Ref Range   Magnesium 1.9 1.7 - 2.4 mg/dL  Comprehensive metabolic panel     Status: Abnormal   Collection Time: 03/14/22  3:52 AM  Result Value Ref Range   Sodium 134 (L) 135 - 145 mmol/L   Potassium 3.7 3.5 - 5.1 mmol/L   Chloride 98 98 - 111 mmol/L   CO2 26 22 - 32 mmol/L    Glucose, Bld 85 70 - 99 mg/dL   BUN 40 (H) 8 - 23 mg/dL   Creatinine, Ser 2.93 (H) 0.44 - 1.00 mg/dL   Calcium 8.3 (L) 8.9 - 10.3 mg/dL   Total Protein 5.4 (L) 6.5 - 8.1 g/dL   Albumin 2.3 (L) 3.5 - 5.0 g/dL   AST 17 15 - 41 U/L   ALT 10 0 - 44 U/L  Alkaline Phosphatase 40 38 - 126 U/L   Total Bilirubin 0.6 0.3 - 1.2 mg/dL   GFR, Estimated 15 (L) >60 mL/min   Anion gap 10 5 - 15    I have reviewed pertinent nursing notes, vitals, labs, and images as necessary. I have ordered labwork to follow up on as indicated.  I have reviewed the last notes from staff over past 24 hours. I have discussed Latoya Curry's care plan and test results with nursing staff, CM/SW, and other staff as appropriate.  Time spent: Greater than 50% of the 55 minute visit was spent in counseling/coordination of care for the Latoya Curry as laid out in the A&P.   LOS: 2 days   Dwyane Dee, MD [facility] 03/14/2022, 1:34 PM

## 2022-03-15 DIAGNOSIS — N3000 Acute cystitis without hematuria: Secondary | ICD-10-CM | POA: Diagnosis not present

## 2022-03-15 DIAGNOSIS — N1330 Unspecified hydronephrosis: Secondary | ICD-10-CM | POA: Diagnosis not present

## 2022-03-15 DIAGNOSIS — N179 Acute kidney failure, unspecified: Secondary | ICD-10-CM | POA: Diagnosis not present

## 2022-03-15 DIAGNOSIS — R410 Disorientation, unspecified: Secondary | ICD-10-CM | POA: Diagnosis not present

## 2022-03-15 LAB — COMPREHENSIVE METABOLIC PANEL
ALT: 7 U/L (ref 0–44)
AST: 20 U/L (ref 15–41)
Albumin: 2.3 g/dL — ABNORMAL LOW (ref 3.5–5.0)
Alkaline Phosphatase: 46 U/L (ref 38–126)
Anion gap: 9 (ref 5–15)
BUN: 30 mg/dL — ABNORMAL HIGH (ref 8–23)
CO2: 26 mmol/L (ref 22–32)
Calcium: 8.2 mg/dL — ABNORMAL LOW (ref 8.9–10.3)
Chloride: 98 mmol/L (ref 98–111)
Creatinine, Ser: 1.85 mg/dL — ABNORMAL HIGH (ref 0.44–1.00)
GFR, Estimated: 26 mL/min — ABNORMAL LOW (ref >60)
Glucose, Bld: 115 mg/dL — ABNORMAL HIGH (ref 70–99)
Potassium: 3.2 mmol/L — ABNORMAL LOW (ref 3.5–5.1)
Sodium: 133 mmol/L — ABNORMAL LOW (ref 135–145)
Total Bilirubin: 0.5 mg/dL (ref 0.3–1.2)
Total Protein: 5.4 g/dL — ABNORMAL LOW (ref 6.5–8.1)

## 2022-03-15 LAB — CBC WITH DIFFERENTIAL/PLATELET
Abs Immature Granulocytes: 0.04 10*3/uL (ref 0.00–0.07)
Basophils Absolute: 0.1 10*3/uL (ref 0.0–0.1)
Basophils Relative: 1 %
Eosinophils Absolute: 0.3 10*3/uL (ref 0.0–0.5)
Eosinophils Relative: 4 %
HCT: 30.7 % — ABNORMAL LOW (ref 36.0–46.0)
Hemoglobin: 10.2 g/dL — ABNORMAL LOW (ref 12.0–15.0)
Immature Granulocytes: 1 %
Lymphocytes Relative: 19 %
Lymphs Abs: 1.6 10*3/uL (ref 0.7–4.0)
MCH: 28.8 pg (ref 26.0–34.0)
MCHC: 33.2 g/dL (ref 30.0–36.0)
MCV: 86.7 fL (ref 80.0–100.0)
Monocytes Absolute: 0.7 10*3/uL (ref 0.1–1.0)
Monocytes Relative: 8 %
Neutro Abs: 5.7 10*3/uL (ref 1.7–7.7)
Neutrophils Relative %: 67 %
Platelets: 340 10*3/uL (ref 150–400)
RBC: 3.54 MIL/uL — ABNORMAL LOW (ref 3.87–5.11)
RDW: 15.5 % (ref 11.5–15.5)
WBC: 8.5 10*3/uL (ref 4.0–10.5)
nRBC: 0 % (ref 0.0–0.2)

## 2022-03-15 LAB — URINE CULTURE: Culture: >=100000 — AB

## 2022-03-15 LAB — MAGNESIUM: Magnesium: 1.8 mg/dL (ref 1.7–2.4)

## 2022-03-15 MED ORDER — ALUM & MAG HYDROXIDE-SIMETH 200-200-20 MG/5ML PO SUSP
30.0000 mL | ORAL | Status: DC | PRN
  Filled 2022-03-15: qty 30

## 2022-03-15 MED ORDER — POTASSIUM CHLORIDE CRYS ER 20 MEQ PO TBCR
40.0000 meq | EXTENDED_RELEASE_TABLET | Freq: Once | ORAL | Status: AC
  Administered 2022-03-15: 40 meq via ORAL
  Filled 2022-03-15: qty 2

## 2022-03-15 MED ORDER — CALCIUM CARBONATE ANTACID 500 MG PO CHEW
2.0000 | CHEWABLE_TABLET | Freq: Three times a day (TID) | ORAL | Status: DC | PRN
  Administered 2022-03-15: 400 mg via ORAL
  Filled 2022-03-15: qty 2

## 2022-03-15 MED ORDER — POTASSIUM CHLORIDE CRYS ER 20 MEQ PO TBCR
30.0000 meq | EXTENDED_RELEASE_TABLET | Freq: Once | ORAL | Status: AC
  Administered 2022-03-15: 30 meq via ORAL
  Filled 2022-03-15: qty 1

## 2022-03-15 NOTE — Progress Notes (Signed)
Creatinine is trending down after stent placement.  The CT angiogram which suggested renal artery stenosis, did not actually visualize the renal arteries, only a hypoperfused kidney and so the assumption was that this was from decreased blood flow.  Duplex ultrasound did not show any evidence of renal artery stenosis.  Since her creatinine has improved with stenting, I suspect this was an obstructive issue and not a blood flow issue.  I do not think there is a role for angiography at this time.  We will continue to monitor her creatinine with no plans for angiography at this time.  Annamarie Major

## 2022-03-15 NOTE — Progress Notes (Signed)
Progress Note    Latoya Curry   WUJ:[id number]  DOB: [birth year]  DOA: 03/12/2022     3 PCP: [name], NP  Initial CC: increasing creatinine   Hospital Course: [name] is an 89 yo female with PMH ascending thoracic aortic fusiform aneurysm, left hydronephrosis, HTN, diverticulosis, hypothyroidism, HLD, GERD, PVCs, depression/anxiety.  She was admitted last month 12/5-12/13 for melena and had EGD done which showed evidence of gastritis and gastric stenosis at the pylorus.  CT abdomen pelvis was negative for any extrinsic causes of gastric stenosis.  She was discharged on PPI twice daily for a month and then daily.  She was treated for possible UTI and CT was concerning for left hydroureteronephrosis.  She was seen by urology and initially had Foley catheter placed but later passed passed voiding trial, plan was outpatient follow-up.  She was scheduled for cystoscopy and stent placement with urology next week.    Patient with history of ascending thoracic aortic fusiform aneurysm initially discovered in 2019 and followed by cardiothoracic surgery.  She had a recent CTA of her thoracic aorta performed on 03/06/2022 which was showing a very slight enlargement of the diameter of the ascending aorta now to 5.5 cm.  Patient had a follow-up visit with cardiothoracic surgery on 03/10/2022 and CTA findings of ascending thoracic aortic aneurysm felt to be basically unchanged.  CT did show findings worrisome for occlusion of the left renal artery.   Seen by PCP yesterday for fatigue, generalized weakness, poor appetite, and weight loss.  Hemoglobin stable and occult stool test in the office was negative for blood.  Labs revealed AKI (creatinine 1.7, baseline 0.8), hypokalemia (potassium 2.8), WBC count 14.4.  Patient sent to the ED for further evaluation.   In the ED, SpO2 87% on room air and placed on 3 L O2.  Afebrile.  Labs significant for WBC 12.4 (improved), hemoglobin 11.3 (stable), creat  2.01. UA also concerning for infection.  CT renal stone study was performed during workup in the ER which showed increasing bilateral hydronephrosis.  She was started on Rocephin.  Vascular surgery and urology were consulted on admission. She underwent cystoscopy on 03/13/2022 with bilateral stent placement.  Interval History:  No events overnight. Still confused this morning but pleasant and cooperative. Tried explaining procedures done again to her as well. Daughter present this afternoon too and update given/questions answered.   Assessment and Plan:  AKI -CT done last month concerning for left hydroureteronephrosis and per chart patient was scheduled for cystoscopy and stent placement with urology next week. -Repeat CT showing increasing collecting system dilatation bilaterally with some retroperitoneal stranding there is focal caliber change identified along the course of the mid ureters bilaterally. -Renal ultrasound showing bilateral collecting system dilatation, more severe on the left than right. Left is similar to prior CT but the right is progressive. 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 -Creatinine 2.01 on admission, baseline 0.8 on labs done 5 weeks ago and has been increasing since then - continue IVF - now has foley s/p cystoscopy  - creatinine continues to downtrend; 1.85 today   UTI -WBC count mildly elevated and improved since yesterday.  No fever or signs of sepsis. -UA with evidence of pyuria and bacteriuria. - purulent drainage noted from left side once stent placed -Continue ceftriaxone -Urine culture growing E. Coli (pan-sensitive)  Concern for possible left renal artery occlusion -Vascular surgery consulted given concern for possible left renal artery occlusion  per CTA done 03/06/2022.   -Renal duplex obtained after admission.  Abnormal left resistive index, no evidence of left renal artery stenosis.  Report mentions "left  kidney perfusion appears decreased as compared to right with limited visualization of internal vasculature secondary to significant collecting system dilatation" - appreciate vascular surgery evaluation; low suspicion for vascular problem and rather considered etiology was obstructive as renal fxn is improving s/p stenting - if renal fxn worsens, will re-discuss with vascular   Hypokalemia -Replete as needed   Acute hypoxemic respiratory failure SpO2 87% on room air in the ED, currently satting well on 2 L.  Patient does not use home oxygen.  Chest x-ray negative for acute finding.  Lungs clear on exam.  PE less likely given no tachycardia, chest pain, or clinical signs of DVT. - d-dimer positive on admission; VQ scan obtained and is "very low probability" - d/c heparin drip - etiology of brief hypoxia possibly in setting of atelectasis (mild bibasilar atelectasis noted on CT even) -Wean off oxygen as able   Mild hyponatremia -Continue gentle IV fluid hydration and monitor labs   Ascending thoracic aortic aneurysm -She had a recent CTA of her thoracic aorta performed on 03/06/2022 which was showing a very slight enlargement of the diameter of the ascending aorta now to 5.5 cm.  Patient had a follow-up visit with cardiothoracic surgery on 03/10/2022 and CTA findings of ascending thoracic aortic aneurysm felt to be basically unchanged. -Outpatient cardiothoracic surgery follow-up.   Acute delirium - suspect multifactorial with infection, sedation, advanced age, and venue change - continue delirium precautions; reorient as needed  Hypertension -Currently normotensive -Hold Lasix given AKI   GERD/gastritis -Continue Protonix   Hypothyroidism TSH 6.59, improved since labs done 2 weeks ago when it was 9.95 - FT4 is elevated however, 1.51 - will clarify if any dose change recently in synthroid before adjusting - continue current dose for now - if nothing else, will need repeat testing  again in ~4 weeks  Asymptomatic cholelithiasis -Incidental finding noted on CT renal: distended gallbladder with stones and sludge -Currently no elevation in LFTs nor signs of obstruction and patient remains asymptomatic -Continue observation.  No further workup at this time   Hyperlipidemia -hold Crestor   Depression and anxiety -Continue citalopram   Old records reviewed in assessment of this patient  Antimicrobials: Rocephin 03/12/2022 >> current  DVT prophylaxis:  HSQ  Code Status:   Code Status: DNR  Mobility Assessment (last 72 hours)     Mobility Assessment     Row Name 03/15/22 0930 03/14/22 2018 03/14/22 0800 03/13/22 1100 03/12/22 2030   Does patient have an order for bedrest or is patient medically unstable No - Continue assessment No - Continue assessment No - Continue assessment No - Continue assessment No - Continue assessment   What is the highest level of mobility based on the progressive mobility assessment? Level 3 (Stands with assist) - Balance while standing  and cannot march in place Level 6 (Walks independently in room and hall) - Balance while walking in room without assist - Complete Level 6 (Walks independently in room and hall) - Balance while walking in room without assist - Complete Level 6 (Walks independently in room and hall) - Balance while walking in room without assist - Complete Level 6 (Walks independently in room and hall) - Balance while walking in room without assist - Complete   Is the above level different from baseline mobility prior to current illness? Yes -  Recommend PT order -- -- -- --            Barriers to discharge:  Disposition Plan: Home 2 to 3 days Status is: Inpatient  Objective: Blood pressure 125/70, pulse 76, temperature 98 F (36.7 C), temperature source Oral, resp. rate 17, height '5\' 3"'$  (1.6 m), weight 61 kg, SpO2 93 %.  Examination:  Physical Exam Constitutional:      Appearance: Normal appearance.      Comments: Remains confused but pleasant/cooperative  HENT:     Head: Normocephalic and atraumatic.     Mouth/Throat:     Mouth: Mucous membranes are moist.  Eyes:     Extraocular Movements: Extraocular movements intact.  Cardiovascular:     Rate and Rhythm: Normal rate and regular rhythm.  Pulmonary:     Effort: Pulmonary effort is normal.     Breath sounds: Normal breath sounds.  Abdominal:     General: Bowel sounds are normal. There is no distension.     Palpations: Abdomen is soft.     Tenderness: There is no abdominal tenderness.  Musculoskeletal:        General: No swelling. Normal range of motion.     Cervical back: Normal range of motion and neck supple.  Skin:    General: Skin is warm and dry.  Neurological:     General: No focal deficit present.     Mental Status: She is alert. She is disoriented.  Psychiatric:        Mood and Affect: Mood normal.      Consultants:  Urology Vascular surgery  Procedures:  03/13/22: Procedure(s): 1.  Cystoscopy 2. Bilateral retrograde pyelogram 3.  Bilateral diagnostic ureteroscopy 4.  Bilateral ureteral stent placement 5. Fluoroscopy with intraoperative interpretation  Data Reviewed: Results for orders placed or performed during the hospital encounter of 03/12/22 (from the past 24 hour(s))  CBC with Differential/Platelet     Status: Abnormal   Collection Time: 03/15/22  3:26 AM  Result Value Ref Range   WBC 8.5 4.0 - 10.5 K/uL   RBC 3.54 (L) 3.87 - 5.11 MIL/uL   Hemoglobin 10.2 (L) 12.0 - 15.0 g/dL   HCT 30.7 (L) 36.0 - 46.0 %   MCV 86.7 80.0 - 100.0 fL   MCH 28.8 26.0 - 34.0 pg   MCHC 33.2 30.0 - 36.0 g/dL   RDW 15.5 11.5 - 15.5 %   Platelets 340 150 - 400 K/uL   nRBC 0.0 0.0 - 0.2 %   Neutrophils Relative % 67 %   Neutro Abs 5.7 1.7 - 7.7 K/uL   Lymphocytes Relative 19 %   Lymphs Abs 1.6 0.7 - 4.0 K/uL   Monocytes Relative 8 %   Monocytes Absolute 0.7 0.1 - 1.0 K/uL   Eosinophils Relative 4 %   Eosinophils  Absolute 0.3 0.0 - 0.5 K/uL   Basophils Relative 1 %   Basophils Absolute 0.1 0.0 - 0.1 K/uL   Immature Granulocytes 1 %   Abs Immature Granulocytes 0.04 0.00 - 0.07 K/uL  Magnesium     Status: None   Collection Time: 03/15/22  3:26 AM  Result Value Ref Range   Magnesium 1.8 1.7 - 2.4 mg/dL  Comprehensive metabolic panel     Status: Abnormal   Collection Time: 03/15/22  3:26 AM  Result Value Ref Range   Sodium 133 (L) 135 - 145 mmol/L   Potassium 3.2 (L) 3.5 - 5.1 mmol/L   Chloride 98 98 - 111 mmol/L  CO2 26 22 - 32 mmol/L   Glucose, Bld 115 (H) 70 - 99 mg/dL   BUN 30 (H) 8 - 23 mg/dL   Creatinine, Ser 1.85 (H) 0.44 - 1.00 mg/dL   Calcium 8.2 (L) 8.9 - 10.3 mg/dL   Total Protein 5.4 (L) 6.5 - 8.1 g/dL   Albumin 2.3 (L) 3.5 - 5.0 g/dL   AST 20 15 - 41 U/L   ALT 7 0 - 44 U/L   Alkaline Phosphatase 46 38 - 126 U/L   Total Bilirubin 0.5 0.3 - 1.2 mg/dL   GFR, Estimated 26 (L) >60 mL/min   Anion gap 9 5 - 15    I have reviewed pertinent nursing notes, vitals, labs, and images as necessary. I have ordered labwork to follow up on as indicated.  I have reviewed the last notes from staff over past 24 hours. I have discussed patient's care plan and test results with nursing staff, CM/SW, and other staff as appropriate.  Time spent: Greater than 50% of the 55 minute visit was spent in counseling/coordination of care for the patient as laid out in the A&P.   LOS: 3 days   Dwyane Dee, MD [facility] 03/15/2022, 3:21 PM

## 2022-03-15 NOTE — Plan of Care (Signed)

## 2022-03-16 DIAGNOSIS — J9601 Acute respiratory failure with hypoxia: Secondary | ICD-10-CM | POA: Diagnosis not present

## 2022-03-16 DIAGNOSIS — N1330 Unspecified hydronephrosis: Secondary | ICD-10-CM | POA: Diagnosis not present

## 2022-03-16 DIAGNOSIS — N3000 Acute cystitis without hematuria: Secondary | ICD-10-CM | POA: Diagnosis not present

## 2022-03-16 DIAGNOSIS — N179 Acute kidney failure, unspecified: Secondary | ICD-10-CM | POA: Diagnosis not present

## 2022-03-16 LAB — CBC WITH DIFFERENTIAL/PLATELET
Abs Immature Granulocytes: 0.03 10*3/uL (ref 0.00–0.07)
Basophils Absolute: 0.1 10*3/uL (ref 0.0–0.1)
Basophils Relative: 1 %
Eosinophils Absolute: 0.3 10*3/uL (ref 0.0–0.5)
Eosinophils Relative: 4 %
HCT: 30.1 % — ABNORMAL LOW (ref 36.0–46.0)
Hemoglobin: 9.6 g/dL — ABNORMAL LOW (ref 12.0–15.0)
Immature Granulocytes: 0 %
Lymphocytes Relative: 18 %
Lymphs Abs: 1.6 10*3/uL (ref 0.7–4.0)
MCH: 28.3 pg (ref 26.0–34.0)
MCHC: 31.9 g/dL (ref 30.0–36.0)
MCV: 88.8 fL (ref 80.0–100.0)
Monocytes Absolute: 0.6 10*3/uL (ref 0.1–1.0)
Monocytes Relative: 7 %
Neutro Abs: 6.2 10*3/uL (ref 1.7–7.7)
Neutrophils Relative %: 70 %
Platelets: 322 10*3/uL (ref 150–400)
RBC: 3.39 MIL/uL — ABNORMAL LOW (ref 3.87–5.11)
RDW: 15.4 % (ref 11.5–15.5)
WBC: 8.9 10*3/uL (ref 4.0–10.5)
nRBC: 0 % (ref 0.0–0.2)

## 2022-03-16 LAB — COMPREHENSIVE METABOLIC PANEL
ALT: 8 U/L (ref 0–44)
AST: 21 U/L (ref 15–41)
Albumin: 2 g/dL — ABNORMAL LOW (ref 3.5–5.0)
Alkaline Phosphatase: 41 U/L (ref 38–126)
Anion gap: 8 (ref 5–15)
BUN: 18 mg/dL (ref 8–23)
CO2: 23 mmol/L (ref 22–32)
Calcium: 8.1 mg/dL — ABNORMAL LOW (ref 8.9–10.3)
Chloride: 103 mmol/L (ref 98–111)
Creatinine, Ser: 1.31 mg/dL — ABNORMAL HIGH (ref 0.44–1.00)
GFR, Estimated: 39 mL/min — ABNORMAL LOW (ref >60)
Glucose, Bld: 89 mg/dL (ref 70–99)
Potassium: 3.9 mmol/L (ref 3.5–5.1)
Sodium: 134 mmol/L — ABNORMAL LOW (ref 135–145)
Total Bilirubin: 0.3 mg/dL (ref 0.3–1.2)
Total Protein: 4.9 g/dL — ABNORMAL LOW (ref 6.5–8.1)

## 2022-03-16 LAB — MAGNESIUM: Magnesium: 1.4 mg/dL — ABNORMAL LOW (ref 1.7–2.4)

## 2022-03-16 MED ORDER — CHLORHEXIDINE GLUCONATE CLOTH 2 % EX PADS
6.0000 | MEDICATED_PAD | Freq: Every day | CUTANEOUS | Status: DC
  Administered 2022-03-16 – 2022-03-18 (×2): 6 via TOPICAL

## 2022-03-16 MED ORDER — CEFADROXIL 500 MG PO CAPS
500.0000 mg | ORAL_CAPSULE | Freq: Two times a day (BID) | ORAL | Status: DC
  Administered 2022-03-17 – 2022-03-18 (×3): 500 mg via ORAL
  Filled 2022-03-16 (×3): qty 1

## 2022-03-16 MED ORDER — MAGNESIUM SULFATE 4 GM/100ML IV SOLN
4.0000 g | Freq: Once | INTRAVENOUS | Status: AC
  Administered 2022-03-16: 4 g via INTRAVENOUS
  Filled 2022-03-16: qty 100

## 2022-03-16 MED ORDER — SODIUM CHLORIDE 0.9 % IV SOLN
1.0000 g | INTRAVENOUS | Status: AC
  Administered 2022-03-16: 1 g via INTRAVENOUS
  Filled 2022-03-16: qty 10

## 2022-03-16 NOTE — Evaluation (Signed)
Occupational Therapy Evaluation Patient Details Name: Latoya Curry MRN: [id number] DOB: [birth year] Today's Date: 03/16/2022   History of Present Illness Pt is an 89 y.o. female admitted 1/17 for AKI. She underwent bilat ureteral stent placement 1/18. Recent hospitalization (Dec 2023) for melena which was followed by 2 week SNF stay. PMH: HTN, GERD, PVCs, hypokalemia, diverticulosis, dyslipidemia   Clinical Impression   PTA pt living in [facility] with her significant other and was receiving [facility] services since her last hospitalization/DC from SNF. Pt currently requires Mod A for mobility and ADL tasks @ RW level due to deficits listed below. Daughter present and states her facility has a "skilled area", where she would like her Mom to receive rehab to facilitate safe return to ILF, Educated daughter on strategies to reduce risk of delirium. Acute OT to follow.      Recommendations for follow up therapy are one component of a multi-disciplinary discharge planning process, led by the attending physician.  Recommendations may be updated based on patient status, additional functional criteria and insurance authorization.   Follow Up Recommendations  Skilled nursing-short term rehab (<3 hours/day)     Assistance Recommended at Discharge Frequent or constant Supervision/Assistance  Patient can return home with the following A lot of help with walking and/or transfers;A lot of help with bathing/dressing/bathroom;Direct supervision/assist for medications management;Direct supervision/assist for financial management;Assist for transportation;Help with stairs or ramp for entrance    Functional Status Assessment  Patient has had a recent decline in their functional status and demonstrates the ability to make significant improvements in function in a reasonable and predictable amount of time.  Equipment Recommendations  None recommended by OT    Recommendations for Other Services        Precautions / Restrictions Precautions Precautions: Fall      Mobility Bed Mobility               General bed mobility comments: OOB in chair    Transfers Overall transfer level: Needs assistance Equipment used: Rolling walker (2 wheels) Transfers: Sit to/from Stand Sit to Stand: Mod assist           General transfer comment: assist to power up       Balance Overall balance assessment: Needs assistance Sitting-balance support: No upper extremity supported, Feet supported Sitting balance-Leahy Scale: Fair     Standing balance support: Bilateral upper extremity supported, During functional activity, Reliant on assistive device for balance Standing balance-Leahy Scale: Poor                             ADL either performed or assessed with clinical judgement   ADL Overall ADL's : Needs assistance/impaired Eating/Feeding: Set up Eating/Feeding Details (indicate cue type and reason): poor po intake per daughter Grooming: Set up;Supervision/safety   Upper Body Bathing: Set up;Supervision/ safety;Sitting   Lower Body Bathing: Moderate assistance;Sit to/from stand   Upper Body Dressing : Minimal assistance   Lower Body Dressing: Maximal assistance   Toilet Transfer: Moderate assistance           Functional mobility during ADLs: Moderate assistance;Rolling walker (2 wheels)       Vision Baseline Vision/History: 1 Wears glasses       Perception     Praxis      Pertinent Vitals/Pain Pain Assessment Pain Assessment: No/denies pain     Hand Dominance Right   Extremity/Trunk Assessment Upper Extremity Assessment Upper Extremity Assessment: Generalized weakness  Lower Extremity Assessment Lower Extremity Assessment: Defer to PT evaluation   Cervical / Trunk Assessment Cervical / Trunk Assessment: Kyphotic   Communication Communication Communication: HOH (has hearing aids in room)   Cognition Arousal/Alertness:  Awake/alert Behavior During Therapy: WFL for tasks assessed/performed Overall Cognitive Status: Impaired/Different from baseline Area of Impairment: Memory, Following commands, Safety/judgement, Awareness, Problem solving, Attention                   Current Attention Level: Selective   Following Commands: Follows one step commands consistently, Follows one step commands with increased time Safety/Judgement: Decreased awareness of safety, Decreased awareness of deficits Awareness: Emergent Problem Solving: Slow processing, Difficulty sequencing, Requires verbal cues       General Comments       Exercises     Shoulder Instructions      Home Living Family/patient expects to be discharged to:: Private residence Living Arrangements: Spouse/significant other Available Help at Discharge: Family;Available PRN/intermittently (significant other can provide supervision but no physical assist) Type of Home: Independent living facility Home Access: Level entry     Home Layout: One level     Bathroom Shower/Tub: Occupational psychologist: Standard     Home Equipment: Rollator (4 wheels);Grab bars - tub/shower;Grab bars - toilet   Additional Comments: Pt has 2 daughters but they don't live close by      Prior Functioning/Environment Prior Level of Function : Independent/Modified Independent             Mobility Comments: amb with rollator, mod I prior to Dec 2023 hospitalization. Pt active with HH at time of admission. ADLs Comments: independent adls and light iadls prior to Dec 5361 hospitalization.uses a sock aid; reacher        OT Problem List: Decreased strength;Decreased activity tolerance;Impaired balance (sitting and/or standing);Decreased safety awareness;Decreased cognition;Decreased knowledge of use of DME or AE      OT Treatment/Interventions: Self-care/ADL training;Therapeutic exercise;DME and/or AE instruction;Therapeutic activities;Cognitive  remediation/compensation;Patient/family education;Balance training    OT Goals(Current goals can be found in the care plan section) Acute Rehab OT Goals Patient Stated Goal: to go back to independent living OT Goal Formulation: With patient/family Time For Goal Achievement: 03/30/22 Potential to Achieve Goals: Good  OT Frequency: Min 2X/week    Co-evaluation              AM-PAC OT "6 Clicks" Daily Activity     Outcome Measure Help from another person eating meals?: A Little Help from another person taking care of personal grooming?: A Little Help from another person toileting, which includes using toliet, bedpan, or urinal?: A Lot Help from another person bathing (including washing, rinsing, drying)?: A Lot Help from another person to put on and taking off regular upper body clothing?: A Little Help from another person to put on and taking off regular lower body clothing?: A Lot 6 Click Score: 15   End of Session Equipment Utilized During Treatment: Gait belt;Rolling walker (2 wheels) Nurse Communication: Mobility status  Activity Tolerance: Patient tolerated treatment well Patient left: in chair;with call bell/phone within reach;with chair alarm set;with family/visitor present  OT Visit Diagnosis: Unsteadiness on feet (R26.81);Other abnormalities of gait and mobility (R26.89);Muscle weakness (generalized) (M62.81);Other symptoms and signs involving cognitive function                Time: 1140-1159 OT Time Calculation (min): 19 min Charges:  OT General Charges $OT Visit: 1 Visit OT Evaluation $OT Eval Moderate Complexity: 1 Mod  Maurie Boettcher, OT/L   Acute OT Clinical Specialist Acute Rehabilitation Services Pager [phone] Office [phone]   [facility] 03/16/2022, 12:26 PM

## 2022-03-16 NOTE — Evaluation (Addendum)
Physical Therapy Evaluation Patient Details Name: Latoya Curry MRN: [id number] DOB: 1931/02/05 Today's Date: 03/16/2022  History of Present Illness  Pt is an 89 y.o. female admitted 1/17 for AKI. She underwent bilat ureteral stent placement 1/18. Recent hospitalization (Dec 2023) for melena which was followed by 2 week SNF stay. PMH: HTN, GERD, PVCs, hypokalemia, diverticulosis, dyslipidemia   Clinical Impression  Pt admitted with above diagnosis. PTA pt resided in [place] with significant other, mod I mobility/ADLs with rollator. Pt currently with functional limitations due to the deficits listed below (see PT Problem List). On eval, pt required min assist bed mobility, mod assist sit to stand, and min assist amb 15' x 2 with RW.  Decreased cognition noted, most likely related to hospital delirium. Pt will benefit from skilled PT to increase their independence and safety with mobility to allow discharge to the venue listed below.         Recommendations for follow up therapy are one component of a multi-disciplinary discharge planning process, led by the attending physician.  Recommendations may be updated based on patient status, additional functional criteria and insurance authorization.  Follow Up Recommendations Skilled nursing-short term rehab (<3 hours/day) Can patient physically be transported by private vehicle: Yes    Assistance Recommended at Discharge Frequent or constant Supervision/Assistance  Patient can return home with the following  A little help with walking and/or transfers;A little help with bathing/dressing/bathroom;Assist for transportation;Assistance with cooking/housework;Direct supervision/assist for medications management    Equipment Recommendations None recommended by PT  Recommendations for Other Services       Functional Status Assessment Patient has had a recent decline in their functional status and demonstrates the ability to make significant improvements  in function in a reasonable and predictable amount of time.     Precautions / Restrictions Precautions Precautions: Fall      Mobility  Bed Mobility Overal bed mobility: Needs Assistance Bed Mobility: Supine to Sit     Supine to sit: Min assist     General bed mobility comments: +rail, increased time, assist to scoot to EOB    Transfers Overall transfer level: Needs assistance Equipment used: Rolling walker (2 wheels) Transfers: Sit to/from Stand Sit to Stand: Mod assist           General transfer comment: assist to power up from EOB and toilet, increased time, retropulsive with initial stance    Ambulation/Gait Ambulation/Gait assistance: Min assist Gait Distance (Feet): 15 Feet (x 2) Assistive device: Rolling walker (2 wheels) Gait Pattern/deviations: Step-through pattern, Narrow base of support, Decreased stride length Gait velocity: decreased Gait velocity interpretation: <1.8 ft/sec, indicate of risk for recurrent falls   General Gait Details: assist to maintain balance  Stairs            Wheelchair Mobility    Modified Rankin (Stroke Patients Only)       Balance Overall balance assessment: Needs assistance Sitting-balance support: No upper extremity supported, Feet supported Sitting balance-Leahy Scale: Fair     Standing balance support: Bilateral upper extremity supported, During functional activity, Reliant on assistive device for balance Standing balance-Leahy Scale: Poor                               Pertinent Vitals/Pain Pain Assessment Pain Assessment: No/denies pain    Home Living Family/patient expects to be discharged to:: Private residence Living Arrangements: Spouse/significant other Available Help at Discharge: Family;Available PRN/intermittently (significant other can provide  supervision but no physical assist) Type of Home: Independent living facility Home Access: Level entry       Home Layout: One  level Home Equipment: Rollator (4 wheels);Grab bars - tub/shower;Grab bars - toilet Additional Comments: Pt has 2 daughters but they don't live close by    Prior Function Prior Level of Function : Independent/Modified Independent             Mobility Comments: amb with rollator, mod I prior to Dec 2023 hospitalization. Pt active with HH at time of admission. ADLs Comments: independent adls and light iadls prior to Dec 8366 hospitalization.     Hand Dominance   Dominant Hand: Right    Extremity/Trunk Assessment   Upper Extremity Assessment Upper Extremity Assessment: Defer to OT evaluation    Lower Extremity Assessment Lower Extremity Assessment: Generalized weakness    Cervical / Trunk Assessment Cervical / Trunk Assessment: Kyphotic  Communication   Communication: HOH (has hearing aids in room)  Cognition Arousal/Alertness: Awake/alert Behavior During Therapy: WFL for tasks assessed/performed Overall Cognitive Status: Impaired/Different from baseline Area of Impairment: Orientation, Memory, Following commands, Safety/judgement, Awareness, Problem solving, Attention                 Orientation Level: Disoriented to, Situation Current Attention Level: Selective Memory: Decreased short-term memory Following Commands: Follows one step commands consistently, Follows one step commands with increased time Safety/Judgement: Decreased awareness of safety, Decreased awareness of deficits Awareness: Emergent Problem Solving: Slow processing, Difficulty sequencing, Requires verbal cues General Comments: Poor understanding of current status, stating "I was fine when I came in here." Pt unaware of the procedure she underwent 1/18.        General Comments      Exercises     Assessment/Plan    PT Assessment Patient needs continued PT services  PT Problem List Decreased strength;Decreased balance;Decreased cognition;Decreased mobility;Decreased activity  tolerance;Decreased safety awareness       PT Treatment Interventions Functional mobility training;Balance training;Patient/family education;Gait training;Therapeutic activities;Therapeutic exercise    PT Goals (Current goals can be found in the Care Plan section)  Acute Rehab PT Goals Patient Stated Goal: home and not SNF PT Goal Formulation: With patient Time For Goal Achievement: 03/30/22 Potential to Achieve Goals: Good    Frequency Min 3X/week     Co-evaluation               AM-PAC PT "6 Clicks" Mobility  Outcome Measure Help needed turning from your back to your side while in a flat bed without using bedrails?: A Little Help needed moving from lying on your back to sitting on the side of a flat bed without using bedrails?: A Little Help needed moving to and from a bed to a chair (including a wheelchair)?: A Little Help needed standing up from a chair using your arms (e.g., wheelchair or bedside chair)?: A Lot Help needed to walk in hospital room?: A Little Help needed climbing 3-5 steps with a railing? : Total 6 Click Score: 15    End of Session Equipment Utilized During Treatment: Gait belt Activity Tolerance: Patient tolerated treatment well Patient left: in chair;with call bell/phone within reach;with chair alarm set Nurse Communication: Mobility status PT Visit Diagnosis: Difficulty in walking, not elsewhere classified (R26.2);Muscle weakness (generalized) (M62.81)    Time: 2947-6546 PT Time Calculation (min) (ACUTE ONLY): 26 min   Charges:   PT Evaluation $PT Eval Moderate Complexity: 1 Mod PT Treatments $Gait Training: 8-22 mins  Lorrin Goodell, PT  Office # [phone] Pager [phone]   Lorriane Shire 03/16/2022, 9:53 AM

## 2022-03-16 NOTE — Plan of Care (Signed)

## 2022-03-16 NOTE — Progress Notes (Signed)
Progress Note    Latoya Curry   FTD:[id number]  DOB: May 18, 1930  DOA: 03/12/2022     4 PCP: [name], NP  Initial CC: increasing creatinine   Hospital Course: Latoya Curry is an 89 yo female with PMH ascending thoracic aortic fusiform aneurysm, left hydronephrosis, HTN, diverticulosis, hypothyroidism, HLD, GERD, PVCs, depression/anxiety.  She was admitted last month 12/5-12/13 for melena and had EGD done which showed evidence of gastritis and gastric stenosis at the pylorus.  CT abdomen pelvis was negative for any extrinsic causes of gastric stenosis.  She was discharged on PPI twice daily for a month and then daily.  She was treated for possible UTI and CT was concerning for left hydroureteronephrosis.  She was seen by urology and initially had Foley catheter placed but later passed passed voiding trial, plan was outpatient follow-up.  She was scheduled for cystoscopy and stent placement with urology next week.    Patient with history of ascending thoracic aortic fusiform aneurysm initially discovered in 2019 and followed by cardiothoracic surgery.  She had a recent CTA of her thoracic aorta performed on 03/06/2022 which was showing a very slight enlargement of the diameter of the ascending aorta now to 5.5 cm.  Patient had a follow-up visit with cardiothoracic surgery on 03/10/2022 and CTA findings of ascending thoracic aortic aneurysm felt to be basically unchanged.  CT did show findings worrisome for occlusion of the left renal artery.   Seen by PCP yesterday for fatigue, generalized weakness, poor appetite, and weight loss.  Hemoglobin stable and occult stool test in the office was negative for blood.  Labs revealed AKI (creatinine 1.7, baseline 0.8), hypokalemia (potassium 2.8), WBC count 14.4.  Patient sent to the ED for further evaluation.   In the ED, SpO2 87% on room air and placed on 3 L O2.  Afebrile.  Labs significant for WBC 12.4 (improved), hemoglobin 11.3 (stable), creat  2.01. UA also concerning for infection.  CT renal stone study was performed during workup in the ER which showed increasing bilateral hydronephrosis.  She was started on Rocephin.  Vascular surgery and urology were consulted on admission. She underwent cystoscopy on 03/13/2022 with bilateral stent placement.  Interval History:  No events overnight.  Less confused this morning.  Sitting in recliner and appears to be doing better overall today. Discussed with daughter this morning, they would be amenable with her going to skilled nursing at discharge as she lives in independent living and does not have adequate help.  Assessment and Plan:  AKI -CT done last month concerning for left hydroureteronephrosis and per chart patient was scheduled for cystoscopy and stent placement with urology next week. -Repeat CT showing increasing collecting system dilatation bilaterally with some retroperitoneal stranding there is focal caliber change identified along the course of the mid ureters bilaterally. -Renal ultrasound showing bilateral collecting system dilatation, more severe on the left than right. Left is similar to prior CT but the right is progressive. 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 -Creatinine 2.01 on admission, baseline 0.8 on labs done 5 weeks ago and has been increasing since then - continue IVF - now has foley s/p cystoscopy  - creatinine continues to downtrend; 1.31 today   UTI -WBC count mildly elevated and improved since yesterday.  No fever or signs of sepsis. -UA with evidence of pyuria and bacteriuria. - purulent drainage noted from left side once stent placed -Continue ceftriaxone; needs 10 day course total given  purulence from left ureter  -Urine culture growing E. Coli (pan-sensitive) - will plan on 5 days Rocephin then transition to cefadroxil for 5 more days starting 1/22   Concern for possible left renal artery  occlusion -Vascular surgery consulted given concern for possible left renal artery occlusion per CTA done 03/06/2022.   -Renal duplex obtained after admission.  Abnormal left resistive index, no evidence of left renal artery stenosis.  Report mentions "left kidney perfusion appears decreased as compared to right with limited visualization of internal vasculature secondary to significant collecting system dilatation" - appreciate vascular surgery evaluation; low suspicion for vascular problem and rather considered etiology was obstructive as renal fxn is improving s/p stenting - if renal fxn worsens, will re-discuss with vascular   Acute delirium - suspect multifactorial with infection, sedation, advanced age, and venue change - continue delirium precautions; reorient as needed   Acute hypoxemic respiratory failure SpO2 87% on room air in the ED, currently satting well on 2 L.  Patient does not use home oxygen.  Chest x-ray negative for acute finding.  Lungs clear on exam.  PE less likely given no tachycardia, chest pain, or clinical signs of DVT. - d-dimer positive on admission; VQ scan obtained and is "very low probability" - d/c heparin drip - etiology of brief hypoxia possibly in setting of atelectasis (mild bibasilar atelectasis noted on CT even) -Wean off oxygen as able  Hypothyroidism TSH 6.59, improved since labs done 2 weeks ago when it was 9.95 - FT4 is elevated however, 1.51 - will clarify if any dose change recently in synthroid before adjusting - continue current dose for now - if nothing else, will need repeat testing again in ~4 weeks   Ascending thoracic aortic aneurysm -She had a recent CTA of her thoracic aorta performed on 03/06/2022 which was showing a very slight enlargement of the diameter of the ascending aorta now to 5.5 cm.  Patient had a follow-up visit with cardiothoracic surgery on 03/10/2022 and CTA findings of ascending thoracic aortic aneurysm felt to be basically  unchanged. -Outpatient cardiothoracic surgery follow-up.  Mild hyponatremia -Continue gentle IV fluid hydration and monitor labs   Hypertension -Currently normotensive -Hold Lasix given AKI  Hypokalemia -Replete as needed   GERD/gastritis -Continue Protonix  Asymptomatic cholelithiasis -Incidental finding noted on CT renal: distended gallbladder with stones and sludge -Currently no elevation in LFTs nor signs of obstruction and patient remains asymptomatic -Continue observation.  No further workup at this time   Hyperlipidemia -hold Crestor   Depression and anxiety -Continue citalopram   Old records reviewed in assessment of this patient  Antimicrobials: Rocephin 03/12/2022 >> 1/21 Cefadroxil 1/22 >>  DVT prophylaxis:  HSQ  Code Status:   Code Status: DNR  Mobility Assessment (last 72 hours)     Mobility Assessment     Row Name 03/16/22 1200 03/16/22 0951 03/15/22 0930 03/14/22 2018 03/14/22 0800   Does patient have an order for bedrest or is patient medically unstable -- -- No - Continue assessment No - Continue assessment No - Continue assessment   What is the highest level of mobility based on the progressive mobility assessment? Level 4 (Walks with assist in room) - Balance while marching in place and cannot step forward and back - Complete Level 4 (Walks with assist in room) - Balance while marching in place and cannot step forward and back - Complete Level 3 (Stands with assist) - Balance while standing  and cannot march in place Level 6 [facility]  independently in room and hall) - Balance while walking in room without assist - Complete Level 6 (Walks independently in room and hall) - Balance while walking in room without assist - Complete   Is the above level different from baseline mobility prior to current illness? -- -- Yes - Recommend PT order -- --            Barriers to discharge:  Disposition Plan: Home 2 to 3 days Status is:  Inpatient  Objective: Blood pressure (!) 150/70, pulse 75, temperature 97.7 F (36.5 C), temperature source Oral, resp. rate 14, height '5\' 3"'$  (1.6 m), weight 61 kg, SpO2 97 %.  Examination:  Physical Exam Constitutional:      Appearance: Normal appearance.     Comments: Less confused today  HENT:     Head: Normocephalic and atraumatic.     Mouth/Throat:     Mouth: Mucous membranes are moist.  Eyes:     Extraocular Movements: Extraocular movements intact.  Cardiovascular:     Rate and Rhythm: Normal rate and regular rhythm.  Pulmonary:     Effort: Pulmonary effort is normal.     Breath sounds: Normal breath sounds.  Abdominal:     General: Bowel sounds are normal. There is no distension.     Palpations: Abdomen is soft.     Tenderness: There is no abdominal tenderness.  Musculoskeletal:        General: No swelling. Normal range of motion.     Cervical back: Normal range of motion and neck supple.  Skin:    General: Skin is warm and dry.  Neurological:     General: No focal deficit present.     Mental Status: She is alert.  Psychiatric:        Mood and Affect: Mood normal.      Consultants:  Urology Vascular surgery  Procedures:  03/13/22: Procedure(s): 1.  Cystoscopy 2. Bilateral retrograde pyelogram 3.  Bilateral diagnostic ureteroscopy 4.  Bilateral ureteral stent placement 5. Fluoroscopy with intraoperative interpretation  Data Reviewed: Results for orders placed or performed during the hospital encounter of 03/12/22 (from the past 24 hour(s))  CBC with Differential/Platelet     Status: Abnormal   Collection Time: 03/16/22  2:59 AM  Result Value Ref Range   WBC 8.9 4.0 - 10.5 K/uL   RBC 3.39 (L) 3.87 - 5.11 MIL/uL   Hemoglobin 9.6 (L) 12.0 - 15.0 g/dL   HCT 30.1 (L) 36.0 - 46.0 %   MCV 88.8 80.0 - 100.0 fL   MCH 28.3 26.0 - 34.0 pg   MCHC 31.9 30.0 - 36.0 g/dL   RDW 15.4 11.5 - 15.5 %   Platelets 322 150 - 400 K/uL   nRBC 0.0 0.0 - 0.2 %    Neutrophils Relative % 70 %   Neutro Abs 6.2 1.7 - 7.7 K/uL   Lymphocytes Relative 18 %   Lymphs Abs 1.6 0.7 - 4.0 K/uL   Monocytes Relative 7 %   Monocytes Absolute 0.6 0.1 - 1.0 K/uL   Eosinophils Relative 4 %   Eosinophils Absolute 0.3 0.0 - 0.5 K/uL   Basophils Relative 1 %   Basophils Absolute 0.1 0.0 - 0.1 K/uL   Immature Granulocytes 0 %   Abs Immature Granulocytes 0.03 0.00 - 0.07 K/uL  Magnesium     Status: Abnormal   Collection Time: 03/16/22  2:59 AM  Result Value Ref Range   Magnesium 1.4 (L) 1.7 - 2.4 mg/dL  Comprehensive metabolic  panel     Status: Abnormal   Collection Time: 03/16/22  2:59 AM  Result Value Ref Range   Sodium 134 (L) 135 - 145 mmol/L   Potassium 3.9 3.5 - 5.1 mmol/L   Chloride 103 98 - 111 mmol/L   CO2 23 22 - 32 mmol/L   Glucose, Bld 89 70 - 99 mg/dL   BUN 18 8 - 23 mg/dL   Creatinine, Ser 1.31 (H) 0.44 - 1.00 mg/dL   Calcium 8.1 (L) 8.9 - 10.3 mg/dL   Total Protein 4.9 (L) 6.5 - 8.1 g/dL   Albumin 2.0 (L) 3.5 - 5.0 g/dL   AST 21 15 - 41 U/L   ALT 8 0 - 44 U/L   Alkaline Phosphatase 41 38 - 126 U/L   Total Bilirubin 0.3 0.3 - 1.2 mg/dL   GFR, Estimated 39 (L) >60 mL/min   Anion gap 8 5 - 15    I have reviewed pertinent nursing notes, vitals, labs, and images as necessary. I have ordered labwork to follow up on as indicated.  I have reviewed the last notes from staff over past 24 hours. I have discussed patient's care plan and test results with nursing staff, CM/SW, and other staff as appropriate.  Time spent: Greater than 50% of the 55 minute visit was spent in counseling/coordination of care for the patient as laid out in the A&P.   LOS: 4 days   Dwyane Dee, MD [facility] 03/16/2022, 12:36 PM

## 2022-03-17 DIAGNOSIS — N1330 Unspecified hydronephrosis: Secondary | ICD-10-CM | POA: Diagnosis not present

## 2022-03-17 DIAGNOSIS — J9601 Acute respiratory failure with hypoxia: Secondary | ICD-10-CM | POA: Diagnosis not present

## 2022-03-17 DIAGNOSIS — N3000 Acute cystitis without hematuria: Secondary | ICD-10-CM | POA: Diagnosis not present

## 2022-03-17 DIAGNOSIS — N179 Acute kidney failure, unspecified: Secondary | ICD-10-CM | POA: Diagnosis not present

## 2022-03-17 LAB — COMPREHENSIVE METABOLIC PANEL WITH GFR
ALT: 9 U/L (ref 0–44)
AST: 22 U/L (ref 15–41)
Albumin: 2.4 g/dL — ABNORMAL LOW (ref 3.5–5.0)
Alkaline Phosphatase: 49 U/L (ref 38–126)
Anion gap: 9 (ref 5–15)
BUN: 14 mg/dL (ref 8–23)
CO2: 24 mmol/L (ref 22–32)
Calcium: 8.3 mg/dL — ABNORMAL LOW (ref 8.9–10.3)
Chloride: 102 mmol/L (ref 98–111)
Creatinine, Ser: 1.27 mg/dL — ABNORMAL HIGH (ref 0.44–1.00)
GFR, Estimated: 40 mL/min — ABNORMAL LOW
Glucose, Bld: 100 mg/dL — ABNORMAL HIGH (ref 70–99)
Potassium: 3.9 mmol/L (ref 3.5–5.1)
Sodium: 135 mmol/L (ref 135–145)
Total Bilirubin: 0.3 mg/dL (ref 0.3–1.2)
Total Protein: 5.6 g/dL — ABNORMAL LOW (ref 6.5–8.1)

## 2022-03-17 LAB — CBC WITH DIFFERENTIAL/PLATELET
Abs Immature Granulocytes: 0.05 10*3/uL (ref 0.00–0.07)
Basophils Absolute: 0.1 10*3/uL (ref 0.0–0.1)
Basophils Relative: 1 %
Eosinophils Absolute: 0.3 10*3/uL (ref 0.0–0.5)
Eosinophils Relative: 3 %
HCT: 32.1 % — ABNORMAL LOW (ref 36.0–46.0)
Hemoglobin: 10.8 g/dL — ABNORMAL LOW (ref 12.0–15.0)
Immature Granulocytes: 1 %
Lymphocytes Relative: 15 %
Lymphs Abs: 1.5 10*3/uL (ref 0.7–4.0)
MCH: 28.9 pg (ref 26.0–34.0)
MCHC: 33.6 g/dL (ref 30.0–36.0)
MCV: 85.8 fL (ref 80.0–100.0)
Monocytes Absolute: 0.7 10*3/uL (ref 0.1–1.0)
Monocytes Relative: 7 %
Neutro Abs: 7.7 10*3/uL (ref 1.7–7.7)
Neutrophils Relative %: 73 %
Platelets: 375 10*3/uL (ref 150–400)
RBC: 3.74 MIL/uL — ABNORMAL LOW (ref 3.87–5.11)
RDW: 15.7 % — ABNORMAL HIGH (ref 11.5–15.5)
WBC: 10.4 10*3/uL (ref 4.0–10.5)
nRBC: 0 % (ref 0.0–0.2)

## 2022-03-17 LAB — MAGNESIUM: Magnesium: 2 mg/dL (ref 1.7–2.4)

## 2022-03-17 MED ORDER — HYDRALAZINE HCL 25 MG PO TABS: 25.0000 mg | ORAL_TABLET | ORAL | Status: DC | PRN

## 2022-03-17 MED ORDER — MELATONIN 3 MG PO TABS
3.0000 mg | ORAL_TABLET | Freq: Every evening | ORAL | Status: DC | PRN
  Administered 2022-03-17: 3 mg via ORAL
  Filled 2022-03-17: qty 1

## 2022-03-17 MED ORDER — LABETALOL HCL 5 MG/ML IV SOLN: 10.0000 mg | INTRAVENOUS | Status: DC | PRN

## 2022-03-17 NOTE — Progress Notes (Signed)
Mobility Specialist Progress Note:   03/17/22 1157  Mobility  Activity Stood at bedside (STS x3, Marched in place)  Level of Assistance Moderate assist, patient does 50-74%  Assistive Device Front wheel walker  Activity Response Tolerated well  Mobility Referral Yes  $Mobility charge 1 Mobility   Pt received in chair and agreeable. ModA to stand d/t feet slipping, CG for marching in place once standing. No complaints. Pt left in chair with all needs met and call bell in reach.   Andrey Campanile Mobility Specialist Please contact via SecureChat or  Rehab office at [phone]

## 2022-03-17 NOTE — NC FL2 (Signed)
[place] LEVEL OF CARE FORM     IDENTIFICATION  Patient Name: Latoya Curry Birthdate: [birth year] Sex: female Admission Date (Current Location): 03/12/2022  [facility] and [place] Number:  Herbalist and Address:  [facility], [place] [address] [place], [place] [address]      Provider Number: [id number]  Attending Physician Name and Address:  Dwyane Dee, MD  Relative Name and Phone Number:  Kassey, [place] (Daughter)  [phone] (Mobile)    Current Level of Care: Hospital Recommended Level of Care: [place] Prior Approval Number:    Date Approved/Denied:   PASRR Number: [id number]  Discharge Plan: SNF    Current Diagnoses: Patient Active Problem List   Diagnosis Date Noted   Bilateral hydronephrosis 03/13/2022   AKI (acute kidney injury) ([place]) 03/12/2022   Renal artery occlusion ([place]) 03/12/2022   Delirium 03/12/2022   Acute hypoxemic respiratory failure ([place]) 03/12/2022   Hyponatremia 03/12/2022   Gastritis determined by endoscopy 01/31/2022   Gastric stenosis 01/31/2022   Acute GI bleeding 01/29/2022   UGI bleed 01/28/2022   Rectal bleed 01/28/2022   Hypothyroidism (acquired) 01/08/2022   Anxiety and depression 01/08/2022   Adjustment disorder 05/07/2020   Encounter for general adult medical examination without abnormal findings 05/07/2020   Appendicitis 05/07/2020   Benign paroxysmal positional vertigo 05/07/2020   Cardiac arrhythmia 05/07/2020   Conjunctivitis of left eye 05/07/2020   Constipation 05/07/2020   Diarrhea 05/07/2020   Diaphoresis 05/07/2020   Diverticular disease of colon 05/07/2020   Diverticulitis of colon 05/07/2020   Fever 05/07/2020   Gastro-esophageal reflux disease without esophagitis 05/07/2020   Graves' disease 05/07/2020   Hearing loss 05/07/2020   History of diverticulitis 05/07/2020   Insomnia 05/07/2020   Left lower quadrant pain 05/07/2020   Loss of  appetite 05/07/2020   Epistaxis 05/07/2020   Nasal crusting 05/07/2020   Neck pain 05/07/2020   Noninflammatory disorder of vagina 05/07/2020   Other long term (current) drug therapy 05/07/2020   Pain in right hand 05/07/2020   Postnasal drip 05/07/2020   Problem related to social environment 05/07/2020   Rash 05/07/2020   Sciatica 05/07/2020   Skin sensation disturbance 05/07/2020   Spasm 05/07/2020   Hyperthyroidism 05/07/2020   Thyrotoxicosis 05/07/2020   Incontinence without sensory awareness 05/07/2020   UTI (urinary tract infection) 05/07/2020   Viral syndrome 05/07/2020   Vitamin D deficiency 05/07/2020   Weight loss 05/07/2020   Thoracic aortic aneurysm without rupture ([place]) 05/07/2020   Chest pain 05/07/2020   Right hip pain 05/07/2020   Low back pain 05/07/2020   Pure hypercholesterolemia 05/07/2020   Acute bronchitis 05/07/2020   Allergic bronchitis 05/07/2020   Allergic rhinitis 05/07/2020   Bronchitis 05/07/2020   Eczema 05/07/2020   Sleep disorder 05/07/2020   Chronic low back pain    Diverticulitis    DJD (degenerative joint disease) of knee    Hypercholesteremia    HTN (hypertension)    Neurodermatitis    Obesity    Osteopenia    Recurrent UTI    Thoracic aortic aneurysm (HCC)    Transient global amnesia    Thoracic aortic atherosclerosis ([place]) 01/11/2020   Aortic regurgitation 05/10/2019   Pain in left hip 03/16/2019   Hypokalemia 06/24/2018   Small bowel obstruction, partial, s/p lap adhesiolysis 06/24/2018 06/24/2018   Incarcerated incisional hernia s/p primary repair 06/24/2018 06/24/2018   Acute gangrenous appendicitis s/p lap appendectomy 06/24/2018 06/23/2018   Aortic root enlargement ([place])  08/19/2017   Ascending aorta dilation (HCC) 08/19/2017   Essential hypertension 08/05/2017   Mixed dyslipidemia 08/05/2017   PVC (premature ventricular contraction) 08/05/2017   Chest discomfort 08/05/2017   Cough 09/23/2011    Orientation RESPIRATION  BLADDER Height & Weight     Self, Time, Situation, Place  Normal Incontinent, Indwelling catheter Weight: 134 lb 6.4 oz (61 kg) Height:  '5\' 3"'$  (160 cm)  BEHAVIORAL SYMPTOMS/MOOD NEUROLOGICAL BOWEL NUTRITION STATUS      Continent Diet (see d/c summary)  AMBULATORY STATUS COMMUNICATION OF NEEDS Skin   Extensive Assist Verbally Surgical wounds (Incision, perenium)                       Personal Care Assistance Level of Assistance  Bathing, Feeding, Dressing Bathing Assistance: Limited assistance Feeding assistance: Independent Dressing Assistance: Limited assistance     Functional Limitations Info  Sight, Hearing, Speech Sight Info: Impaired Hearing Info: Impaired Speech Info: Adequate    SPECIAL CARE FACTORS FREQUENCY  OT (By licensed OT), PT (By licensed PT)     PT Frequency: 5x/week OT Frequency: 5x/week            Contractures Contractures Info: Not present    Additional Factors Info  Code Status, Allergies Code Status Info: DNR Allergies Info: Levofloxacin, codeine, Simvastatin, Sulfamethoxazole-trimethoprim, Zestril (lisinopril)           Current Medications (03/17/2022):  This is the current hospital active medication list Current Facility-Administered Medications  Medication Dose Route Frequency Provider Last Rate Last Admin   0.9 %  sodium chloride infusion   Intravenous Continuous Dwyane Dee, MD 75 mL/hr at 03/17/22 0628 New Bag at 03/17/22 5027   acetaminophen (TYLENOL) tablet 650 mg  650 mg Oral Q6H PRN Shela Leff, MD   650 mg at 03/15/22 2304   Or   acetaminophen (TYLENOL) suppository 650 mg  650 mg Rectal Q6H PRN Shela Leff, MD       alum & mag hydroxide-simeth (MAALOX/MYLANTA) 200-200-20 MG/5ML suspension 30 mL  30 mL Oral Q4H PRN Dwyane Dee, MD       calcium carbonate (TUMS - dosed in mg elemental calcium) chewable tablet 400 mg of elemental calcium  2 tablet Oral TID PRN Dwyane Dee, MD   400 mg of elemental calcium at  03/15/22 1617   cefadroxil (DURICEF) capsule 500 mg  500 mg Oral BID Dwyane Dee, MD   500 mg at 03/17/22 7412   Chlorhexidine Gluconate Cloth 2 % PADS 6 each  6 each Topical Daily Dwyane Dee, MD   6 each at 03/16/22 1321   citalopram (CELEXA) tablet 20 mg  20 mg Oral Daily Shela Leff, MD   20 mg at 03/17/22 8786   fesoterodine (TOVIAZ) tablet 4 mg  4 mg Oral Standley Brooking, MD   4 mg at 03/16/22 2150   heparin injection 5,000 Units  5,000 Units Subcutaneous Lenise Arena, MD   5,000 Units at 03/17/22 7672   hydrALAZINE (APRESOLINE) tablet 25 mg  25 mg Oral Q4H PRN Dwyane Dee, MD       labetalol (NORMODYNE) injection 10 mg  10 mg Intravenous Q4H PRN Dwyane Dee, MD       levothyroxine (SYNTHROID) tablet 75 mcg  75 mcg Oral Q0600 Fransico Meadow, MD   75 mcg at 03/17/22 0947   lip balm (BLISTEX) ointment   Topical PRN Dwyane Dee, MD       pantoprazole (PROTONIX) EC tablet 40 mg  40 mg Oral Daily Shela Leff, MD   40 mg at 03/17/22 6720     Discharge Medications: Please see discharge summary for a list of discharge medications.  Relevant Imaging Results:  Relevant Lab Results:   Additional Information ssn: [id number]  Bethann Berkshire, LCSW

## 2022-03-17 NOTE — Care Management Important Message (Signed)
Important Message  Patient Details  Name: Latoya Curry MRN: [id number] Date of Birth: [birth year]   Medicare Important Message Given:  Yes     Hannah Beat 03/17/2022, 3:50 PM

## 2022-03-17 NOTE — TOC Initial Note (Addendum)
Transition of Care [facility]) - Initial/Assessment Note    Patient Details  Name: Latoya Curry MRN: [id number] Date of Birth: [birth year]  [facility] CM/SW Contact:    Bethann Berkshire, [place] Phone Number: 03/17/2022, 11:01 AM  Clinical Narrative:                  CSW met with pt and pt's daughter, Blair Promise, bedside and discussed SNF recommendation. Pt and daughter both agreeable to SNF. Pt lives at [place] with her significant other, Sandrea Hammond. Will[facility] Carol is in s/o's name. Daughter is wanting pt to go to [facility] which [facility] the SNF for [facility]. Pt was at [facility] in December 2023 though was just theres for a week or less per daughter. Fl2 completed and referral sent to [facility].   1245: [facility] confirmed they can admit pt once insurance Josem Kaufmann is approved. CSW called and updated pt's daughter. CSW initiated SNF auth request in [facility] portal. [id number]; Status: pending   Expected Discharge Plan: [place] Barriers to Discharge: Insurance Authorization, No SNF bed     Patient Goals and CMS Choice            Expected Discharge Plan and Services       Living arrangements for the past 2 months: [place]                                      Prior Living Arrangements/Services Living arrangements for the past 2 months: [place] Lives with:: Significant Other Patient language and need for interpreter reviewed:: Yes        Need for Family Participation in Patient Care: Yes (Comment) Care giver support system in place?: Yes (comment)   Criminal Activity/Legal Involvement Pertinent to Current Situation/Hospitalization: No - Comment as needed  Activities of Daily Living      Permission Sought/Granted   Permission granted to share information with : Yes, Verbal Permission Granted  Share Information with NAME: Shoun,Sheryl (Daughter)  [phone] (Mobile)            Emotional Assessment Appearance:: Appears stated age Attitude/Demeanor/Rapport:  (Quiet) Affect (typically observed): Accepting Orientation: : Oriented to Self, Oriented to Place, Oriented to  Time, Oriented to Situation Alcohol / Substance Use: Not Applicable Psych Involvement: No (comment)  Admission diagnosis:  Hypokalemia [E87.6] Patient Active Problem List   Diagnosis Date Noted   Bilateral hydronephrosis 03/13/2022   AKI (acute kidney injury) ([place]) 03/12/2022   Renal artery occlusion ([place]) 03/12/2022   Delirium 03/12/2022   Acute hypoxemic respiratory failure ([place]) 03/12/2022   Hyponatremia 03/12/2022   Gastritis determined by endoscopy 01/31/2022   Gastric stenosis 01/31/2022   Acute GI bleeding 01/29/2022   UGI bleed 01/28/2022   Rectal bleed 01/28/2022   Hypothyroidism (acquired) 01/08/2022   Anxiety and depression 01/08/2022   Adjustment disorder 05/07/2020   Encounter for general adult medical examination without abnormal findings 05/07/2020   Appendicitis 05/07/2020   Benign paroxysmal positional vertigo 05/07/2020   Cardiac arrhythmia 05/07/2020   Conjunctivitis of left eye 05/07/2020   Constipation 05/07/2020   Diarrhea 05/07/2020   Diaphoresis 05/07/2020   Diverticular disease of colon 05/07/2020   Diverticulitis of colon 05/07/2020   Fever 05/07/2020   Gastro-esophageal reflux disease without esophagitis 05/07/2020   Graves' disease 05/07/2020   Hearing loss 05/07/2020   History of diverticulitis 05/07/2020  Insomnia 05/07/2020   Left lower quadrant pain 05/07/2020   Loss of appetite 05/07/2020   Epistaxis 05/07/2020   Nasal crusting 05/07/2020   Neck pain 05/07/2020   Noninflammatory disorder of vagina 05/07/2020   Other long term (current) drug therapy 05/07/2020   Pain in right hand 05/07/2020   Postnasal drip 05/07/2020   Problem related to social environment 05/07/2020   Rash 05/07/2020   Sciatica 05/07/2020   Skin sensation  disturbance 05/07/2020   Spasm 05/07/2020   Hyperthyroidism 05/07/2020   Thyrotoxicosis 05/07/2020   Incontinence without sensory awareness 05/07/2020   UTI (urinary tract infection) 05/07/2020   Viral syndrome 05/07/2020   Vitamin D deficiency 05/07/2020   Weight loss 05/07/2020   Thoracic aortic aneurysm without rupture ([place]) 05/07/2020   Chest pain 05/07/2020   Right hip pain 05/07/2020   Low back pain 05/07/2020   Pure hypercholesterolemia 05/07/2020   Acute bronchitis 05/07/2020   Allergic bronchitis 05/07/2020   Allergic rhinitis 05/07/2020   Bronchitis 05/07/2020   Eczema 05/07/2020   Sleep disorder 05/07/2020   Chronic low back pain    Diverticulitis    DJD (degenerative joint disease) of knee    Hypercholesteremia    HTN (hypertension)    Neurodermatitis    Obesity    Osteopenia    Recurrent UTI    Thoracic aortic aneurysm (HCC)    Transient global amnesia    Thoracic aortic atherosclerosis ([place]) 01/11/2020   Aortic regurgitation 05/10/2019   Pain in left hip 03/16/2019   Hypokalemia 06/24/2018   Small bowel obstruction, partial, s/p lap adhesiolysis 06/24/2018 06/24/2018   Incarcerated incisional hernia s/p primary repair 06/24/2018 06/24/2018   Acute gangrenous appendicitis s/p lap appendectomy 06/24/2018 06/23/2018   Aortic root enlargement ([place]) 08/19/2017   Ascending aorta dilation ([place]) 08/19/2017   Essential hypertension 08/05/2017   Mixed dyslipidemia 08/05/2017   PVC (premature ventricular contraction) 08/05/2017   Chest discomfort 08/05/2017   Cough 09/23/2011   PCP:  [name], NP Pharmacy:   [facility] [id number] - [place], [place] - [address] [place] [address] Phone: [phone] Fax: [phone]     Social Determinants of Health (SDOH) Social History: SDOH Screenings   Food Insecurity: No Food Insecurity (01/28/2022)  Housing: Low Risk  (01/28/2022)  Transportation  Needs: No Transportation Needs (01/28/2022)  Utilities: Not At Risk (01/28/2022)  Tobacco Use: Low Risk  (03/14/2022)   SDOH Interventions:     Readmission Risk Interventions     No data to display

## 2022-03-17 NOTE — Progress Notes (Signed)
[facility] J. Olena Heckle NP text paged that patient is having some anxiety she express she is fearful but not able to voice why she is fearful she has no pain or discomfort. Arthor Captain

## 2022-03-17 NOTE — Progress Notes (Signed)
Progress Note    Latoya Curry   IZT:[id number]  DOB: [birth year]  DOA: 03/12/2022     5 PCP: [name], NP  Initial CC: increasing creatinine   Hospital Course: Latoya Curry is an 89 yo female with PMH ascending thoracic aortic fusiform aneurysm, left hydronephrosis, HTN, diverticulosis, hypothyroidism, HLD, GERD, PVCs, depression/anxiety.  She was admitted last month 12/5-12/13 for melena and had EGD done which showed evidence of gastritis and gastric stenosis at the pylorus.  CT abdomen pelvis was negative for any extrinsic causes of gastric stenosis.  She was discharged on PPI twice daily for a month and then daily.  She was treated for possible UTI and CT was concerning for left hydroureteronephrosis.  She was seen by urology and initially had Foley catheter placed but later passed passed voiding trial, plan was outpatient follow-up.  She was scheduled for cystoscopy and stent placement with urology next week.    Patient with history of ascending thoracic aortic fusiform aneurysm initially discovered in 2019 and followed by cardiothoracic surgery.  She had a recent CTA of her thoracic aorta performed on 03/06/2022 which was showing a very slight enlargement of the diameter of the ascending aorta now to 5.5 cm.  Patient had a follow-up visit with cardiothoracic surgery on 03/10/2022 and CTA findings of ascending thoracic aortic aneurysm felt to be basically unchanged.  CT did show findings worrisome for occlusion of the left renal artery.   Seen by PCP yesterday for fatigue, generalized weakness, poor appetite, and weight loss.  Hemoglobin stable and occult stool test in the office was negative for blood.  Labs revealed AKI (creatinine 1.7, baseline 0.8), hypokalemia (potassium 2.8), WBC count 14.4.  Patient sent to the ED for further evaluation.   In the ED, SpO2 87% on room air and placed on 3 L O2.  Afebrile.  Labs significant for WBC 12.4 (improved), hemoglobin 11.3 (stable), creat  2.01. UA also concerning for infection.  CT renal stone study was performed during workup in the ER which showed increasing bilateral hydronephrosis.  She was started on Rocephin.  Vascular surgery and urology were consulted on admission. She underwent cystoscopy on 03/13/2022 with bilateral stent placement. Renal function slowly improved after stent placement.  Foley catheter was removed on 03/17/2022 for a trial of void.  Interval History:  No events overnight. Sitting in recliner when seen. Her mentation still appears to be doing better each day. No further repetition of questions.  Foley bag noted with mild hematuria but has been lightening in color. I discussed with urology this morning and okay to remove foley and do TOV today.  Daughter present bedside and updated as well.   Assessment and Plan:  AKI -CT done last month concerning for left hydroureteronephrosis and per chart patient was scheduled for cystoscopy and stent placement with urology next week. -Repeat CT showing increasing collecting system dilatation bilaterally with some retroperitoneal stranding there is focal caliber change identified along the course of the mid ureters bilaterally. -Renal ultrasound showing bilateral collecting system dilatation, more severe on the left than right. Left is similar to prior CT but the right is progressive. 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 -Creatinine 2.01 on admission, baseline 0.8 on labs done 5 weeks ago and has been increasing since then - continue IVF 1 more day - creatinine still improving  - discussed with urology on 1/22; okay to remove foley for TOV - purewick okay; remove foley and perform  q6h bladder scans  UTI -WBC count mildly elevated and improved since yesterday.  No fever or signs of sepsis. -UA with evidence of pyuria and bacteriuria. - purulent drainage noted from left side once stent placed -Continue ceftriaxone; needs 10  day course total given purulence from left ureter  -Urine culture growing E. Coli (pan-sensitive) - will plan on 5 days Rocephin then transition to cefadroxil for 5 more days starting 1/22   Concern for possible left renal artery occlusion -Vascular surgery consulted given concern for possible left renal artery occlusion per CTA done 03/06/2022.   -Renal duplex obtained after admission.  Abnormal left resistive index, no evidence of left renal artery stenosis.  Report mentions "left kidney perfusion appears decreased as compared to right with limited visualization of internal vasculature secondary to significant collecting system dilatation" - appreciate vascular surgery evaluation; low suspicion for vascular problem and rather considered etiology was obstructive as renal fxn is improving s/p stenting - if renal fxn worsens, will re-discuss with vascular   Acute delirium - resolved  - suspect multifactorial with infection, sedation, advanced age, and venue change - continue delirium precautions; reorient as needed   Acute hypoxemic respiratory failure - resolved  SpO2 87% on room air in the ED, currently satting well on 2 L.  Patient does not use home oxygen.  Chest x-ray negative for acute finding.  Lungs clear on exam.  PE less likely given no tachycardia, chest pain, or clinical signs of DVT. - d-dimer positive on admission; VQ scan obtained and is "very low probability" - d/c heparin drip - etiology of brief hypoxia possibly in setting of atelectasis (mild bibasilar atelectasis noted on CT even) - has been weaned off O2  Hypothyroidism TSH 6.59, improved since labs done 2 weeks ago when it was 9.95 - FT4 is elevated however, 1.51 - will clarify if any dose change recently in synthroid before adjusting - continue current dose for now - if nothing else, will need repeat testing again in ~4 weeks   Ascending thoracic aortic aneurysm -She had a recent CTA of her thoracic aorta performed  on 03/06/2022 which was showing a very slight enlargement of the diameter of the ascending aorta now to 5.5 cm.  Patient had a follow-up visit with cardiothoracic surgery on 03/10/2022 and CTA findings of ascending thoracic aortic aneurysm felt to be basically unchanged. -Outpatient cardiothoracic surgery follow-up.  Mild hyponatremia -Continue gentle IV fluid hydration and monitor labs   Hypertension -Currently normotensive -Hold Lasix given AKI  Hypokalemia -Replete as needed   GERD/gastritis -Continue Protonix  Asymptomatic cholelithiasis -Incidental finding noted on CT renal: distended gallbladder with stones and sludge -Currently no elevation in LFTs nor signs of obstruction and patient remains asymptomatic -Continue observation. No further workup at this time   Hyperlipidemia -hold Crestor   Depression and anxiety -Continue citalopram   Old records reviewed in assessment of this patient  Antimicrobials: Rocephin 03/12/2022 >> 1/21 Cefadroxil 1/22 >> current   DVT prophylaxis:  HSQ  Code Status:   Code Status: DNR  Mobility Assessment (last 72 hours)     Mobility Assessment     Row Name 03/17/22 0944 03/16/22 1943 03/16/22 1200 03/16/22 0951 03/16/22 0945   Does patient have an order for bedrest or is patient medically unstable No - Continue assessment No - Continue assessment -- -- No - Continue assessment   What is the highest level of mobility based on the progressive mobility assessment? Level 5 (Walks with assist in room/hall) -  Balance while stepping forward/back and can walk in room with assist - Complete Level 4 (Walks with assist in room) - Balance while marching in place and cannot step forward and back - Complete Level 4 (Walks with assist in room) - Balance while marching in place and cannot step forward and back - Complete Level 4 (Walks with assist in room) - Balance while marching in place and cannot step forward and back - Complete Level 4 (Walks with  assist in room) - Balance while marching in place and cannot step forward and back - Complete   Is the above level different from baseline mobility prior to current illness? Yes - Recommend PT order Yes - Recommend PT order -- -- Yes - Recommend PT order    Peck Name 03/15/22 0930 03/14/22 2018         Does patient have an order for bedrest or is patient medically unstable No - Continue assessment No - Continue assessment      What is the highest level of mobility based on the progressive mobility assessment? Level 3 (Stands with assist) - Balance while standing  and cannot march in place Level 6 (Walks independently in room and hall) - Balance while walking in room without assist - Complete      Is the above level different from baseline mobility prior to current illness? Yes - Recommend PT order --               Barriers to discharge:  Disposition Plan: SNF 2-3 days Status is: Inpatient  Objective: Blood pressure (!) 145/86, pulse 97, temperature (!) 97.5 F (36.4 C), temperature source Oral, resp. rate 16, height '5\' 3"'$  (1.6 m), weight 61 kg, SpO2 97 %.  Examination:  Physical Exam Constitutional:      Appearance: Normal appearance.     Comments: Less confused today  HENT:     Head: Normocephalic and atraumatic.     Mouth/Throat:     Mouth: Mucous membranes are moist.  Eyes:     Extraocular Movements: Extraocular movements intact.  Cardiovascular:     Rate and Rhythm: Normal rate and regular rhythm.  Pulmonary:     Effort: Pulmonary effort is normal.     Breath sounds: Normal breath sounds.  Abdominal:     General: Bowel sounds are normal. There is no distension.     Palpations: Abdomen is soft.     Tenderness: There is no abdominal tenderness.  Genitourinary:    Comments: Mild light red urine noted in foley bag Musculoskeletal:        General: No swelling. Normal range of motion.     Cervical back: Normal range of motion and neck supple.  Skin:    General: Skin is  warm and dry.  Neurological:     General: No focal deficit present.     Mental Status: She is alert.  Psychiatric:        Mood and Affect: Mood normal.      Consultants:  Urology Vascular surgery  Procedures:  03/13/22: Procedure(s): 1.  Cystoscopy 2. Bilateral retrograde pyelogram 3.  Bilateral diagnostic ureteroscopy 4.  Bilateral ureteral stent placement 5. Fluoroscopy with intraoperative interpretation  Data Reviewed: Results for orders placed or performed during the hospital encounter of 03/12/22 (from the past 24 hour(s))  CBC with Differential/Platelet     Status: Abnormal   Collection Time: 03/17/22  2:40 AM  Result Value Ref Range   WBC 10.4 4.0 - 10.5 K/uL  RBC 3.74 (L) 3.87 - 5.11 MIL/uL   Hemoglobin 10.8 (L) 12.0 - 15.0 g/dL   HCT 32.1 (L) 36.0 - 46.0 %   MCV 85.8 80.0 - 100.0 fL   MCH 28.9 26.0 - 34.0 pg   MCHC 33.6 30.0 - 36.0 g/dL   RDW 15.7 (H) 11.5 - 15.5 %   Platelets 375 150 - 400 K/uL   nRBC 0.0 0.0 - 0.2 %   Neutrophils Relative % 73 %   Neutro Abs 7.7 1.7 - 7.7 K/uL   Lymphocytes Relative 15 %   Lymphs Abs 1.5 0.7 - 4.0 K/uL   Monocytes Relative 7 %   Monocytes Absolute 0.7 0.1 - 1.0 K/uL   Eosinophils Relative 3 %   Eosinophils Absolute 0.3 0.0 - 0.5 K/uL   Basophils Relative 1 %   Basophils Absolute 0.1 0.0 - 0.1 K/uL   Immature Granulocytes 1 %   Abs Immature Granulocytes 0.05 0.00 - 0.07 K/uL  Magnesium     Status: None   Collection Time: 03/17/22  2:40 AM  Result Value Ref Range   Magnesium 2.0 1.7 - 2.4 mg/dL  Comprehensive metabolic panel     Status: Abnormal   Collection Time: 03/17/22  2:40 AM  Result Value Ref Range   Sodium 135 135 - 145 mmol/L   Potassium 3.9 3.5 - 5.1 mmol/L   Chloride 102 98 - 111 mmol/L   CO2 24 22 - 32 mmol/L   Glucose, Bld 100 (H) 70 - 99 mg/dL   BUN 14 8 - 23 mg/dL   Creatinine, Ser 1.27 (H) 0.44 - 1.00 mg/dL   Calcium 8.3 (L) 8.9 - 10.3 mg/dL   Total Protein 5.6 (L) 6.5 - 8.1 g/dL   Albumin 2.4  (L) 3.5 - 5.0 g/dL   AST 22 15 - 41 U/L   ALT 9 0 - 44 U/L   Alkaline Phosphatase 49 38 - 126 U/L   Total Bilirubin 0.3 0.3 - 1.2 mg/dL   GFR, Estimated 40 (L) >60 mL/min   Anion gap 9 5 - 15    I have reviewed pertinent nursing notes, vitals, labs, and images as necessary. I have ordered labwork to follow up on as indicated.  I have reviewed the last notes from staff over past 24 hours. I have discussed patient's care plan and test results with nursing staff, CM/SW, and other staff as appropriate.  Time spent: Greater than 50% of the 55 minute visit was spent in counseling/coordination of care for the patient as laid out in the A&P.   LOS: 5 days   Dwyane Dee, MD [facility] 03/17/2022, 4:39 PM

## 2022-03-18 DIAGNOSIS — N3000 Acute cystitis without hematuria: Secondary | ICD-10-CM | POA: Diagnosis not present

## 2022-03-18 DIAGNOSIS — N179 Acute kidney failure, unspecified: Secondary | ICD-10-CM | POA: Diagnosis not present

## 2022-03-18 DIAGNOSIS — J9601 Acute respiratory failure with hypoxia: Secondary | ICD-10-CM | POA: Diagnosis not present

## 2022-03-18 DIAGNOSIS — N1330 Unspecified hydronephrosis: Secondary | ICD-10-CM | POA: Diagnosis not present

## 2022-03-18 LAB — CBC WITH DIFFERENTIAL/PLATELET
Abs Immature Granulocytes: 0.03 10*3/uL (ref 0.00–0.07)
Basophils Absolute: 0.1 10*3/uL (ref 0.0–0.1)
Basophils Relative: 1 %
Eosinophils Absolute: 0.3 10*3/uL (ref 0.0–0.5)
Eosinophils Relative: 4 %
HCT: 32.5 % — ABNORMAL LOW (ref 36.0–46.0)
Hemoglobin: 10.5 g/dL — ABNORMAL LOW (ref 12.0–15.0)
Immature Granulocytes: 0 %
Lymphocytes Relative: 19 %
Lymphs Abs: 1.4 10*3/uL (ref 0.7–4.0)
MCH: 28.2 pg (ref 26.0–34.0)
MCHC: 32.3 g/dL (ref 30.0–36.0)
MCV: 87.1 fL (ref 80.0–100.0)
Monocytes Absolute: 0.5 10*3/uL (ref 0.1–1.0)
Monocytes Relative: 6 %
Neutro Abs: 5.2 10*3/uL (ref 1.7–7.7)
Neutrophils Relative %: 70 %
Platelets: 352 10*3/uL (ref 150–400)
RBC: 3.73 MIL/uL — ABNORMAL LOW (ref 3.87–5.11)
RDW: 15.9 % — ABNORMAL HIGH (ref 11.5–15.5)
WBC: 7.5 10*3/uL (ref 4.0–10.5)
nRBC: 0 % (ref 0.0–0.2)

## 2022-03-18 LAB — MAGNESIUM: Magnesium: 1.7 mg/dL (ref 1.7–2.4)

## 2022-03-18 LAB — BASIC METABOLIC PANEL
Anion gap: 9 (ref 5–15)
BUN: 9 mg/dL (ref 8–23)
CO2: 22 mmol/L (ref 22–32)
Calcium: 8.3 mg/dL — ABNORMAL LOW (ref 8.9–10.3)
Chloride: 101 mmol/L (ref 98–111)
Creatinine, Ser: 1.2 mg/dL — ABNORMAL HIGH (ref 0.44–1.00)
GFR, Estimated: 43 mL/min — ABNORMAL LOW (ref >60)
Glucose, Bld: 93 mg/dL (ref 70–99)
Potassium: 3.8 mmol/L (ref 3.5–5.1)
Sodium: 132 mmol/L — ABNORMAL LOW (ref 135–145)

## 2022-03-18 MED ORDER — PANTOPRAZOLE SODIUM 40 MG PO TBEC: 40.0000 mg | DELAYED_RELEASE_TABLET | Freq: Every day | ORAL | Status: DC

## 2022-03-18 MED ORDER — LEVOTHYROXINE SODIUM 75 MCG PO TABS: 75.0000 ug | ORAL_TABLET | Freq: Every day | ORAL | Status: DC

## 2022-03-18 MED ORDER — CEFADROXIL 500 MG PO CAPS: 500.0000 mg | ORAL_CAPSULE | Freq: Two times a day (BID) | ORAL | 0 refills | Status: AC

## 2022-03-18 NOTE — TOC Transition Note (Addendum)
Transition of Care [facility]) - CM/SW Discharge Note   Patient Details  Name: Latoya Curry MRN: [id number] Date of Birth: 11/26/1930  Transition of Care [facility]) CM/SW Contact:  Bethann Berkshire, [place] Phone Number: 03/18/2022, 11:37 AM   Clinical Narrative:     Josem Kaufmann approved 03/18/22-- 03/20/22. [id number]#[id number]  Patient will DC to: [facility] SNF Anticipated DC date: 03/18/2022 Family notified: Daughter [name] [phone]  Transport by: Corey Harold   Per MD patient ready for DC to [facility]. RN, patient, patient's family, and facility notified of DC. Discharge Summary and FL2 sent to facility. RN to call report prior to discharge ([phone] Room 508). DC packet on chart. Ambulance transport requested for patient.   CSW will sign off for now as social work intervention is no longer needed. Please consult [place] again if new needs arise.   Final next level of care: [place] Barriers to Discharge: No Barriers Identified   Patient Goals and CMS Choice      Discharge Placement                Patient chooses bed at: [facility] Patient to be transferred to facility by: [place] Name of family member notified: Daughter [name]   [phone] Patient and family notified of of transfer: 03/18/22  Discharge Plan and Services Additional resources added to the After Visit Summary for                                       Social Determinants of Health (SDOH) Interventions SDOH Screenings   Food Insecurity: No Food Insecurity (01/28/2022)  Housing: Low Risk  (01/28/2022)  Transportation Needs: No Transportation Needs (01/28/2022)  Utilities: Not At Risk (01/28/2022)  Tobacco Use: Low Risk  (03/14/2022)     Readmission Risk Interventions     No data to display

## 2022-03-18 NOTE — Progress Notes (Signed)
Physical Therapy Treatment Patient Details Name: Latoya Curry MRN: [id number] DOB: 07-22-1930 Today's Date: 03/18/2022   History of Present Illness Pt is an 89 y.o. female admitted 1/17 for AKI. She underwent bilat ureteral stent placement 1/18. Recent hospitalization (Dec 2023) for melena which was followed by 2 week SNF stay. PMH: HTN, GERD, PVCs, hypokalemia, diverticulosis, dyslipidemia    PT Comments    Pt progressing towards physical therapy goals. Was able to perform transfers and ambulation with gross min guard assist to min assist and RW for support. Pt prefers to have shoes donned for mobility instead of the non-skid hospital socks. Daughter, [name], present and supportive throughout and provided chair follow. Pt and daughter anticipate d/c to SNF this afternoon. Will continue to follow and progress as able per POC.    Recommendations for follow up therapy are one component of a multi-disciplinary discharge planning process, led by the attending physician.  Recommendations may be updated based on patient status, additional functional criteria and insurance authorization.  Follow Up Recommendations  Skilled nursing-short term rehab (<3 hours/day) Can patient physically be transported by private vehicle: Yes   Assistance Recommended at Discharge Frequent or constant Supervision/Assistance  Patient can return home with the following A little help with walking and/or transfers;A little help with bathing/dressing/bathroom;Assist for transportation;Assistance with cooking/housework;Direct supervision/assist for medications management   Equipment Recommendations  None recommended by PT    Recommendations for Other Services       Precautions / Restrictions Precautions Precautions: Fall Restrictions Weight Bearing Restrictions: No     Mobility  Bed Mobility               General bed mobility comments: Pt was received sitting up in recliner.    Transfers Overall  transfer level: Needs assistance Equipment used: Rolling walker (2 wheels) Transfers: Sit to/from Stand Sit to Stand: Min assist           General transfer comment: VC's for hand placement on seated surface for safety. Light assist required for power up to full stand.    Ambulation/Gait Ambulation/Gait assistance: Min assist, Min guard Gait Distance (Feet): 200 Feet Assistive device: Rolling walker (2 wheels) Gait Pattern/deviations: Step-through pattern, Narrow base of support, Decreased stride length Gait velocity: decreased Gait velocity interpretation: <1.8 ft/sec, indicate of risk for recurrent falls   General Gait Details: Chair follow utilized however pt did not require a seated rest break throughout ambulation. VC's for improved posture, closer walker proximity and forward gaze. Occasional assist for balance and walker management but grossly min guard assist.   Stairs             Wheelchair Mobility    Modified Rankin (Stroke Patients Only)       Balance Overall balance assessment: Needs assistance Sitting-balance support: No upper extremity supported, Feet supported Sitting balance-Leahy Scale: Fair     Standing balance support: Bilateral upper extremity supported, During functional activity, Reliant on assistive device for balance Standing balance-Leahy Scale: Poor                              Cognition Arousal/Alertness: Awake/alert Behavior During Therapy: WFL for tasks assessed/performed Overall Cognitive Status: Impaired/Different from baseline Area of Impairment: Memory, Following commands, Safety/judgement, Awareness, Problem solving, Attention                 Orientation Level: Disoriented to, Situation, Time Current Attention Level: Selective Memory: Decreased short-term memory Following Commands:  Follows one step commands consistently, Follows one step commands with increased time Safety/Judgement: Decreased awareness of  safety, Decreased awareness of deficits Awareness: Emergent Problem Solving: Slow processing, Difficulty sequencing, Requires verbal cues          Exercises      General Comments        Pertinent Vitals/Pain Pain Assessment Pain Assessment: No/denies pain    Home Living                          Prior Function            PT Goals (current goals can now be found in the care plan section) Acute Rehab PT Goals Patient Stated Goal: Be able to return to her home with her gentleman friend who she lives with. PT Goal Formulation: With patient Time For Goal Achievement: 03/30/22 Potential to Achieve Goals: Good Progress towards PT goals: Progressing toward goals    Frequency    Min 3X/week      PT Plan Current plan remains appropriate    Co-evaluation              AM-PAC PT "6 Clicks" Mobility   Outcome Measure  Help needed turning from your back to your side while in a flat bed without using bedrails?: A Little Help needed moving from lying on your back to sitting on the side of a flat bed without using bedrails?: A Little Help needed moving to and from a bed to a chair (including a wheelchair)?: A Little Help needed standing up from a chair using your arms (e.g., wheelchair or bedside chair)?: A Lot Help needed to walk in hospital room?: A Little Help needed climbing 3-5 steps with a railing? : Total 6 Click Score: 15    End of Session Equipment Utilized During Treatment: Gait belt Activity Tolerance: Patient tolerated treatment well Patient left: in chair;with call bell/phone within reach;with chair alarm set Nurse Communication: Mobility status PT Visit Diagnosis: Difficulty in walking, not elsewhere classified (R26.2);Muscle weakness (generalized) (M62.81)     Time: 5465-0354 PT Time Calculation (min) (ACUTE ONLY): 20 min  Charges:  $Gait Training: 8-22 mins                     Rolinda Roan, PT, DPT Acute Rehabilitation  Services Secure Chat Preferred Office: [phone]    Thelma Comp 03/18/2022, 1:36 PM

## 2022-03-18 NOTE — Discharge Summary (Addendum)
Physician Discharge Summary   Latoya Curry WNU:[id number] DOB: 10/21/1930 DOA: 03/12/2022  PCP: [name], NP  Admit date: 03/12/2022 Discharge date:  03/18/2022  Barriers to discharge: none  Admitted From: Home Disposition:  [facility] Discharging physician: Dwyane Dee, MD  Recommendations for Outpatient Follow-up:  Follow up with urology  Repeat BMP within 1 week Repeat thyroid studies in approximately 4 weeks  Home Health:  Equipment/Devices:   Discharge Condition: stable CODE STATUS: DNR Diet recommendation:  Diet Orders (From admission, onward)     Start     Ordered   03/18/22 0000  Diet general        03/18/22 1109   03/13/22 1839  Diet regular Room service appropriate? Yes; Fluid consistency: Thin  Diet effective now       Question Answer Comment  Room service appropriate? Yes   Fluid consistency: Thin      03/13/22 1838            Hospital Course: Latoya Curry is an 89 yo female with PMH ascending thoracic aortic fusiform aneurysm, left hydronephrosis, HTN, diverticulosis, hypothyroidism, HLD, GERD, PVCs, depression/anxiety.  She was admitted last month 12/5-12/13 for melena and had EGD done which showed evidence of gastritis and gastric stenosis at the pylorus.  CT abdomen pelvis was negative for any extrinsic causes of gastric stenosis.  She was discharged on PPI twice daily for a month and then daily.  She was treated for possible UTI and CT was concerning for left hydroureteronephrosis.  She was seen by urology and initially had Foley catheter placed but later passed passed voiding trial, plan was outpatient follow-up.  She was scheduled for cystoscopy and stent placement with urology next week.    Patient with history of ascending thoracic aortic fusiform aneurysm initially discovered in 2019 and followed by cardiothoracic surgery.  She had a recent CTA of her thoracic aorta performed on 03/06/2022 which was showing a very slight  enlargement of the diameter of the ascending aorta now to 5.5 cm.  Patient had a follow-up visit with cardiothoracic surgery on 03/10/2022 and CTA findings of ascending thoracic aortic aneurysm felt to be basically unchanged.  CT did show findings worrisome for occlusion of the left renal artery.   Seen by PCP yesterday for fatigue, generalized weakness, poor appetite, and weight loss.  Hemoglobin stable and occult stool test in the office was negative for blood.  Labs revealed AKI (creatinine 1.7, baseline 0.8), hypokalemia (potassium 2.8), WBC count 14.4.  Patient sent to the ED for further evaluation.   In the ED, SpO2 87% on room air and placed on 3 L O2.  Afebrile.  Labs significant for WBC 12.4 (improved), hemoglobin 11.3 (stable), creat 2.01. UA also concerning for infection.  CT renal stone study was performed during workup in the ER which showed increasing bilateral hydronephrosis.  She was started on Rocephin.  Vascular surgery and urology were consulted on admission. She underwent cystoscopy on 03/13/2022 with bilateral stent placement. Renal function slowly improved after stent placement.  Foley catheter was removed on 03/17/2022 for a trial of void and this was achieved successfully.   Assessment and Plan:  AKI -CT done last month concerning for left hydroureteronephrosis and per chart patient was scheduled for cystoscopy and stent placement with urology next week. -Repeat CT showing increasing collecting system dilatation bilaterally with some retroperitoneal stranding there is focal caliber change identified along the course of the mid ureters bilaterally. -Renal ultrasound showing bilateral collecting system dilatation,  more severe on the left than right. Left is similar to prior CT but the right is progressive. 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 -Creatinine 2.01 on admission, baseline 0.8 on labs done 5 weeks ago and has been increasing  since then -Creatinine peaked at 3.32 then progressively down trended -Creatinine at time of discharge, 1.2 -Repeat BMP within 1 week   UTI -WBC count mildly elevated and improved since yesterday.  No fever or signs of sepsis. -UA with evidence of pyuria and bacteriuria. - purulent drainage noted from left side once stent placed -Continue ceftriaxone; needs 10 day course total given purulence from left ureter  -Urine culture growing E. Coli (pan-sensitive) - will plan on 5 days Rocephin then transition to cefadroxil for 5 more days starting 1/22; course to be completed on 03/21/2022   Concern for possible left renal artery occlusion - ruled out -Vascular surgery consulted given concern for possible left renal artery occlusion per CTA done 03/06/2022.   -Renal duplex obtained after admission.  Abnormal left resistive index, no evidence of left renal artery stenosis.  Report mentions "left kidney perfusion appears decreased as compared to right with limited visualization of internal vasculature secondary to significant collecting system dilatation" - appreciate vascular surgery evaluation; low suspicion for vascular problem and rather considered etiology was obstructive as renal fxn is improving s/p stenting   Acute delirium - resolved  - suspect multifactorial with infection, sedation, advanced age, and venue change - continue delirium precautions; reorient as needed   Acute hypoxemic respiratory failure - resolved  SpO2 87% on room air in the ED, currently satting well on 2 L.  Patient does not use home oxygen.  Chest x-ray negative for acute finding.  Lungs clear on exam.  PE less likely given no tachycardia, chest pain, or clinical signs of DVT. - d-dimer positive on admission; VQ scan obtained and is "very low probability" - d/c heparin drip - etiology of brief hypoxia possibly in setting of atelectasis (mild bibasilar atelectasis noted on CT even) - has been weaned off O2    Hypothyroidism TSH 6.59, improved since labs done 2 weeks ago when it was 9.95 - FT4 is elevated however, 1.51 - continue current dose for now - will need repeat testing again in ~4 weeks then adjust as necessary   Ascending thoracic aortic aneurysm -She had a recent CTA of her thoracic aorta performed on 03/06/2022 which was showing a very slight enlargement of the diameter of the ascending aorta now to 5.5 cm.  Patient had a follow-up visit with cardiothoracic surgery on 03/10/2022 and CTA findings of ascending thoracic aortic aneurysm felt to be basically unchanged. -Outpatient cardiothoracic surgery follow-up.   Mild hyponatremia -stable; Na 132-134; so possible chronic component    Hypertension -Currently normotensive -Hold Lasix given AKI   Hypokalemia -Repleted   GERD/gastritis -Continue Protonix   Asymptomatic cholelithiasis -Incidental finding noted on CT renal: distended gallbladder with stones and sludge -Currently no elevation in LFTs nor signs of obstruction and patient remains asymptomatic -Continue observation. No further workup at this time   Hyperlipidemia -resume Crestor   Depression and anxiety -Continue citalopram   Principal Diagnosis: AKI (acute kidney injury) [facility])  Discharge Diagnoses: Active Hospital Problems   Diagnosis Date Noted   AKI (acute kidney injury) ([place]) 03/12/2022    Priority: 1.   Bilateral hydronephrosis 03/13/2022    Priority: 2.   UTI (urinary tract infection) 05/07/2020    Priority: 2.  Renal artery occlusion ([place]) 03/12/2022    Priority: 4.   Delirium 03/12/2022    Priority: 4.   Acute hypoxemic respiratory failure ([place]) 03/12/2022    Priority: 4.   Hyponatremia 03/12/2022   HTN (hypertension)    Hypokalemia 06/24/2018   Ascending aorta dilation ([place]) 08/19/2017   Mixed dyslipidemia 08/05/2017    Resolved Hospital Problems  No resolved problems to display.     Discharge Instructions     Diet general   Complete  by: As directed    Increase activity slowly   Complete by: As directed       Allergies as of 03/18/2022       Reactions   Levofloxacin Other (See Comments)   Cardiac advised to avoid/Aneurysm   Codeine Nausea And Vomiting   Simvastatin Other (See Comments)   Dizziness   Sulfamethoxazole-trimethoprim Other (See Comments)    Dyspepsia   Zestril [lisinopril] Cough        Medication List     STOP taking these medications    furosemide 20 MG tablet Commonly known as: LASIX   potassium chloride SA 20 MEQ tablet Commonly known as: KLOR-CON M   traMADol 50 MG tablet Commonly known as: ULTRAM       TAKE these medications    aspirin EC 81 MG tablet Take 81 mg by mouth every Monday, Wednesday, and Friday. Swallow whole.   Breztri Aerosphere 160-9-4.8 MCG/ACT Aero Generic drug: Budeson-Glycopyrrol-Formoterol Inhale 1 puff into the lungs daily as needed (for flares).   cefadroxil 500 MG capsule Commonly known as: DURICEF Take 1 capsule (500 mg total) by mouth 2 (two) times daily for 4 days.   cetirizine 10 MG tablet Commonly known as: ZYRTEC Take 10 mg by mouth daily.   citalopram 20 MG tablet Commonly known as: CELEXA Take 1 tablet (20 mg total) by mouth daily.   clobetasol cream 0.05 % Commonly known as: TEMOVATE Apply 1 Application topically 2 (two) times daily as needed (for irritation).   fesoterodine 8 MG Tb24 tablet Commonly known as: TOVIAZ TAKE 1 TABLET EVERY DAY What changed: when to take this   levothyroxine 75 MCG tablet Commonly known as: SYNTHROID Take 1 tablet (75 mcg total) by mouth daily at 6 (six) AM. Start taking on: March 19, 2022 What changed:  medication strength how much to take when to take this additional instructions Another medication with the same name was removed. Continue taking this medication, and follow the directions you see here.   Multi Vitamin Tabs Take 1 tablet by mouth daily with breakfast.   pantoprazole 40  MG tablet Commonly known as: PROTONIX Take 1 tablet (40 mg total) by mouth daily. Start taking on: March 19, 2022 What changed:  when to take this additional instructions   rosuvastatin 5 MG tablet Commonly known as: CRESTOR Take 5 mg by mouth every Monday, Wednesday, and Friday.   Vitamin D3 25 MCG (1000 UT) Chew Chew 1,000 Units by mouth daily.        Allergies  Allergen Reactions   Levofloxacin Other (See Comments)    Cardiac advised to avoid/Aneurysm   Codeine Nausea And Vomiting   Simvastatin Other (See Comments)    Dizziness    Sulfamethoxazole-Trimethoprim Other (See Comments)     Dyspepsia    Zestril [Lisinopril] Cough    Consultations: Urology  Procedures: 03/13/22: Procedure(s): 1.  Cystoscopy 2. Bilateral retrograde pyelogram 3.  Bilateral diagnostic ureteroscopy 4.  Bilateral ureteral stent placement 5. Fluoroscopy with intraoperative interpretation  Discharge Exam: BP 138/75 (BP Location: Right Arm)   Pulse 85   Temp 98.4 F (36.9 C) (Oral)   Resp 17   Ht '5\' 3"'$  (1.6 m)   Wt 61 kg   SpO2 97%   BMI 23.81 kg/m  Physical Exam Constitutional:      General: She is not in acute distress.    Appearance: Normal appearance. She is not ill-appearing.  HENT:     Head: Normocephalic and atraumatic.     Mouth/Throat:     Mouth: Mucous membranes are moist.  Eyes:     Extraocular Movements: Extraocular movements intact.  Cardiovascular:     Rate and Rhythm: Normal rate and regular rhythm.  Pulmonary:     Effort: Pulmonary effort is normal.     Breath sounds: Normal breath sounds.  Abdominal:     General: Bowel sounds are normal. There is no distension.     Palpations: Abdomen is soft.     Tenderness: There is no abdominal tenderness.  Musculoskeletal:        General: No swelling. Normal range of motion.     Cervical back: Normal range of motion and neck supple.  Skin:    General: Skin is warm and dry.  Neurological:     General: No focal  deficit present.     Mental Status: She is alert.  Psychiatric:        Mood and Affect: Mood normal.      The results of significant diagnostics from this hospitalization (including imaging, microbiology, ancillary and laboratory) are listed below for reference.   Microbiology: Recent Results (from the past 240 hour(s))  Urine Culture     Status: Abnormal   Collection Time: 03/12/22  4:22 PM   Specimen: Urine, Clean Catch  Result Value Ref Range Status   Specimen Description   Final    URINE, CLEAN CATCH Performed at [facility], [address] [place], [place] [address]    Special Requests   Final    NONE Performed at [facility], [address] [place], [place] [address]    Culture >=100,000 COLONIES/mL ESCHERICHIA COLI (A)  Final   Report Status 03/14/2022 FINAL  Final   Organism ID, Bacteria ESCHERICHIA COLI (A)  Final      Susceptibility   Escherichia coli - MIC*    AMPICILLIN <=2 SENSITIVE Sensitive     CEFAZOLIN <=4 SENSITIVE Sensitive     CEFEPIME <=0.12 SENSITIVE Sensitive     CEFTRIAXONE <=0.25 SENSITIVE Sensitive     CIPROFLOXACIN <=0.25 SENSITIVE Sensitive     GENTAMICIN <=1 SENSITIVE Sensitive     IMIPENEM <=0.25 SENSITIVE Sensitive     NITROFURANTOIN <=16 SENSITIVE Sensitive     TRIMETH/SULFA <=20 SENSITIVE Sensitive     AMPICILLIN/SULBACTAM <=2 SENSITIVE Sensitive     PIP/TAZO <=4 SENSITIVE Sensitive     * >=100,000 COLONIES/mL ESCHERICHIA COLI  Urine Culture     Status: Abnormal   Collection Time: 03/13/22  4:43 PM   Specimen: Urine, Cystoscope  Result Value Ref Range Status   Specimen Description CYSTOSCOPY  Final   Special Requests   Final    LEFT RENAL PELVIS Performed at [facility], [place] [address], [place], [place] [address]    Culture >=100,000 COLONIES/mL ESCHERICHIA COLI (A)  Final   Report Status 03/15/2022 FINAL  Final   Organism ID, Bacteria ESCHERICHIA COLI (A)  Final      Susceptibility   Escherichia  coli - MIC*    AMPICILLIN <=2 SENSITIVE Sensitive     CEFAZOLIN <=4 SENSITIVE Sensitive     CEFEPIME <=0.12 SENSITIVE Sensitive     CEFTAZIDIME <=1 SENSITIVE Sensitive     CEFTRIAXONE <=0.25 SENSITIVE Sensitive     CIPROFLOXACIN <=0.25 SENSITIVE Sensitive     GENTAMICIN <=1 SENSITIVE Sensitive     IMIPENEM <=0.25 SENSITIVE Sensitive     TRIMETH/SULFA <=20 SENSITIVE Sensitive     AMPICILLIN/SULBACTAM <=2 SENSITIVE Sensitive     PIP/TAZO <=4 SENSITIVE Sensitive     * >=100,000 COLONIES/mL ESCHERICHIA COLI     Labs: BNP (last 3 results) Recent Labs    03/12/22 1045  BNP 287.8*   Basic Metabolic Panel: Recent Labs  Lab 03/14/22 0352 03/15/22 0326 03/16/22 0259 03/17/22 0240 03/18/22 0602  NA 134* 133* 134* 135 132*  K 3.7 3.2* 3.9 3.9 3.8  CL 98 98 103 102 101  CO2 '26 26 23 24 22  '$ GLUCOSE 85 115* 89 100* 93  BUN 40* 30* '18 14 9  '$ CREATININE 2.93* 1.85* 1.31* 1.27* 1.20*  CALCIUM 8.3* 8.2* 8.1* 8.3* 8.3*  MG 1.9 1.8 1.4* 2.0 1.7   Liver Function Tests: Recent Labs  Lab 03/12/22 1045 03/14/22 0352 03/15/22 0326 03/16/22 0259 03/17/22 0240  AST '21 17 20 21 22  '$ ALT '13 10 7 8 9  '$ ALKPHOS 52 40 46 41 49  BILITOT 0.8 0.6 0.5 0.3 0.3  PROT 7.3 5.4* 5.4* 4.9* 5.6*  ALBUMIN 3.1* 2.3* 2.3* 2.0* 2.4*   No results for input(s): "LIPASE", "AMYLASE" in the last 168 hours. No results for input(s): "AMMONIA" in the last 168 hours. CBC: Recent Labs  Lab 03/14/22 0352 03/15/22 0326 03/16/22 0259 03/17/22 0240 03/18/22 0602  WBC 11.9* 8.5 8.9 10.4 7.5  NEUTROABS 9.7* 5.7 6.2 7.7 5.2  HGB 9.5* 10.2* 9.6* 10.8* 10.5*  HCT 29.1* 30.7* 30.1* 32.1* 32.5*  MCV 86.9 86.7 88.8 85.8 87.1  PLT 321 340 322 375 352   Cardiac Enzymes: No results for input(s): "CKTOTAL", "CKMB", "CKMBINDEX", "TROPONINI" in the last 168 hours. BNP: Invalid input(s): "POCBNP" CBG: No results for input(s): "GLUCAP" in the last 168 hours. D-Dimer No results for input(s): "DDIMER" in the last 72  hours. Hgb A1c No results for input(s): "HGBA1C" in the last 72 hours. Lipid Profile No results for input(s): "CHOL", "HDL", "LDLCALC", "TRIG", "CHOLHDL", "LDLDIRECT" in the last 72 hours. Thyroid function studies No results for input(s): "TSH", "T4TOTAL", "T3FREE", "THYROIDAB" in the last 72 hours.  Invalid input(s): "FREET3" Anemia work up No results for input(s): "VITAMINB12", "FOLATE", "FERRITIN", "TIBC", "IRON", "RETICCTPCT" in the last 72 hours. Urinalysis    Component Value Date/Time   COLORURINE YELLOW 03/12/2022 1304   APPEARANCEUR TURBID (A) 03/12/2022 1304   LABSPEC >=1.030 03/12/2022 1304   PHURINE 6.0 03/12/2022 1304   GLUCOSEU NEGATIVE 03/12/2022 1304   HGBUR MODERATE (A) 03/12/2022 1304   BILIRUBINUR SMALL (A) 03/12/2022 1304   BILIRUBINUR negative 04/28/2020 1139   KETONESUR NEGATIVE 03/12/2022 1304   PROTEINUR 100 (A) 03/12/2022 1304   UROBILINOGEN 0.2 04/28/2020 1139   UROBILINOGEN 0.2 01/08/2007 2322   NITRITE NEGATIVE 03/12/2022 1304   LEUKOCYTESUR LARGE (A) 03/12/2022 1304   Sepsis Labs Recent Labs  Lab 03/15/22 0326 03/16/22 0259 03/17/22 0240 03/18/22 0602  WBC 8.5 8.9 10.4 7.5   Microbiology Recent Results (from the past 240 hour(s))  Urine Culture     Status: Abnormal   Collection Time: 03/12/22  4:22 PM   Specimen:  Urine, Clean Catch  Result Value Ref Range Status   Specimen Description   Final    URINE, CLEAN CATCH Performed at [facility], [address] [place], [place] [address]    Special Requests   Final    NONE Performed at [facility], [address] [place], [place] [address]    Culture >=100,000 COLONIES/mL ESCHERICHIA COLI (A)  Final   Report Status 03/14/2022 FINAL  Final   Organism ID, Bacteria ESCHERICHIA COLI (A)  Final      Susceptibility   Escherichia coli - MIC*    AMPICILLIN <=2 SENSITIVE Sensitive     CEFAZOLIN <=4 SENSITIVE Sensitive     CEFEPIME <=0.12 SENSITIVE Sensitive      CEFTRIAXONE <=0.25 SENSITIVE Sensitive     CIPROFLOXACIN <=0.25 SENSITIVE Sensitive     GENTAMICIN <=1 SENSITIVE Sensitive     IMIPENEM <=0.25 SENSITIVE Sensitive     NITROFURANTOIN <=16 SENSITIVE Sensitive     TRIMETH/SULFA <=20 SENSITIVE Sensitive     AMPICILLIN/SULBACTAM <=2 SENSITIVE Sensitive     PIP/TAZO <=4 SENSITIVE Sensitive     * >=100,000 COLONIES/mL ESCHERICHIA COLI  Urine Culture     Status: Abnormal   Collection Time: 03/13/22  4:43 PM   Specimen: Urine, Cystoscope  Result Value Ref Range Status   Specimen Description CYSTOSCOPY  Final   Special Requests   Final    LEFT RENAL PELVIS Performed at [facility], [place] [address] [place], [place] [address]    Culture >=100,000 COLONIES/mL ESCHERICHIA COLI (A)  Final   Report Status 03/15/2022 FINAL  Final   Organism ID, Bacteria ESCHERICHIA COLI (A)  Final      Susceptibility   Escherichia coli - MIC*    AMPICILLIN <=2 SENSITIVE Sensitive     CEFAZOLIN <=4 SENSITIVE Sensitive     CEFEPIME <=0.12 SENSITIVE Sensitive     CEFTAZIDIME <=1 SENSITIVE Sensitive     CEFTRIAXONE <=0.25 SENSITIVE Sensitive     CIPROFLOXACIN <=0.25 SENSITIVE Sensitive     GENTAMICIN <=1 SENSITIVE Sensitive     IMIPENEM <=0.25 SENSITIVE Sensitive     TRIMETH/SULFA <=20 SENSITIVE Sensitive     AMPICILLIN/SULBACTAM <=2 SENSITIVE Sensitive     PIP/TAZO <=4 SENSITIVE Sensitive     * >=100,000 COLONIES/mL ESCHERICHIA COLI    Procedures/Studies: DG C-Arm 1-60 Min  Result Date: 03/13/2022 CLINICAL DATA:  Fluoro guidance provided EXAM: DG C-ARM 1-60 MIN; DG C-ARM 1-60 MIN-NO REPORT FINDINGS: Dose: 2702 mGy Fluoro time 221s IMPRESSION: C-arm fluoro guidance provided. Electronically Signed   By: Sammie Bench M.D.   On: 03/13/2022 17:51   DG C-Arm 1-60 Min-No Report  Result Date: 03/13/2022 Fluoroscopy was utilized by the requesting physician.  No radiographic interpretation.   DG C-Arm 1-60 Min-No Report  Result Date:  03/13/2022 Fluoroscopy was utilized by the requesting physician.  No radiographic interpretation.   VAS US RENAL ARTERY DUPLEX  Result Date: 03/13/2022 ABDOMINAL VISCERAL Patient Name:  RENN STILLE  Date of Exam:   03/13/2022 Medical Rec #: [id number]         Accession #:    [id number] Date of Birth: 01-07-1931         Patient Gender: F Patient Age:   89 years Exam Location:  [facility] Procedure:      VAS US RENAL ARTERY DUPLEX Referring Phys: [id number] VASUNDHRA RATHORE -------------------------------------------------------------------------------- Indications: Concern for left renal stenosis on CTA chest/aorta High Risk Factors: Hypertension, hyperlipidemia. Other  Factors: Significant hydronephrosis on US renal 03-12-2022, left > right. Limitations: Air/bowel gas and constant patient movement/altered mental status. Comparison Study: 03-06-2022 CTA chest/aorta shows new significant                   hypoenhancement of the visualized portion of the left kidney                   suggests interval occlusion of the left renal artery. Performing Technologist: Archie Patten RVS  Examination Guidelines: A complete evaluation includes B-mode imaging, spectral Doppler, color Doppler, and power Doppler as needed of all accessible portions of each vessel. Bilateral testing is considered an integral part of a complete examination. Limited examinations for reoccurring indications may be performed as noted.  Duplex Findings: +----------------------+--------+--------+------+--------+ Mesenteric            PSV cm/sEDV cm/sPlaqueComments +----------------------+--------+--------+------+--------+ Aorta at SMA            202                          +----------------------+--------+--------+------+--------+ Celiac Artery Proximal  109                          +----------------------+--------+--------+------+--------+ SMA Proximal            200                           +----------------------+--------+--------+------+--------+    +------------------+--------+--------+-------+ Right Renal ArteryPSV cm/sEDV cm/sComment +------------------+--------+--------+-------+ Origin               70      13           +------------------+--------+--------+-------+ Proximal             53      11           +------------------+--------+--------+-------+ Mid                  46      13           +------------------+--------+--------+-------+ Distal               60      18           +------------------+--------+--------+-------+ +-----------------+--------+--------+-------+ Left Renal ArteryPSV cm/sEDV cm/sComment +-----------------+--------+--------+-------+ Origin              47      10           +-----------------+--------+--------+-------+ Proximal            54      9            +-----------------+--------+--------+-------+ Mid                 50      15           +-----------------+--------+--------+-------+ Distal              53      12           +-----------------+--------+--------+-------+ +------------+--------+--------+----+-----------+--------+--------+----+ Right KidneyPSV cm/sEDV cm/sRI  Left KidneyPSV cm/sEDV cm/sRI   +------------+--------+--------+----+-----------+--------+--------+----+ Upper Pole  27      11      0.58Upper Pole 16      5       0.71 +------------+--------+--------+----+-----------+--------+--------+----+ Mid         14  6       0.61Mid        13      5       0.64 +------------+--------+--------+----+-----------+--------+--------+----+ Lower Pole  20      9       0.57Lower Pole 14      4       0.72 +------------+--------+--------+----+-----------+--------+--------+----+ Hilar       58      13      0.78Hilar      43      7       0.84 +------------+--------+--------+----+-----------+--------+--------+----+ +------------------+-----+------------------+-----+ Right  Kidney           Left Kidney             +------------------+-----+------------------+-----+ RAR                    RAR                     +------------------+-----+------------------+-----+ RAR (manual)      0.35 RAR (manual)      0.27  +------------------+-----+------------------+-----+ Cortex                 Cortex                  +------------------+-----+------------------+-----+ Cortex thickness       Corex thickness         +------------------+-----+------------------+-----+ Kidney length (cm)11.40Kidney length (cm)11.90 +------------------+-----+------------------+-----+  Summary: Renal:  Right: Normal right Resisitive Index. No evidence of right renal        artery stenosis. Normal size right kidney. Left:  Abnormal left Resisitve Index. No evidence of left renal        artery stenosis. Normal size of left kidney. Left kidney        perfusion appears decreased as compared to right with limited        visualization of the internal vasculature secondary to        significant collecting system dilitation, and low flow        velocities as compared to the right kidney.  *See table(s) above for measurements and observations.  Diagnosing physician: Orlie Pollen  Electronically signed by Orlie Pollen on 03/13/2022 at 4:41:21 PM.    Final    NM Pulmonary Perfusion  Result Date: 03/13/2022 CLINICAL DATA:  Pulmonary embolism suspected. High probability. Altered mental status. History of aortic aneurysm. EXAM: NUCLEAR MEDICINE PERFUSION LUNG SCAN TECHNIQUE: Perfusion images were obtained in multiple projections after intravenous injection of radiopharmaceutical. Ventilation scans intentionally deferred if perfusion scan and chest x-ray adequate for interpretation during COVID 19 epidemic. RADIOPHARMACEUTICALS:  4.0 mCi Tc-39mMAA IV COMPARISON:  No prior V/Q scan is available for comparison. Correlation is made to chest radiograph 03/12/2022 and 02/27/2015; CTA chest 03/06/2022  (performed for thoracic aortic aneurysm evaluation). FINDINGS: The cardiac defect is large, consistent with the moderate to high-grade cardiomegaly seen on prior radiographs and CT. There is a potential small perfusion defect overlying the inferolateral left lung, in the region of chronic scarring seen on prior CT. No perfusion defect is seen in this region on left anterior oblique or lateral perfusion images. IMPRESSION: There is only a small potential defect seen within the inferolateral right lung, in the general region of the anteromedial basal segment of the left lower lobe, however this appears to be explained by matching chronic right costophrenic angle scarring in this same region on prior CT. Therefore, this is considered a  very low probability perfusion nuclear medicine scan by the "modified perfusion only PIOPED II criteria." Electronically Signed   By: Yvonne Kendall M.D.   On: 03/13/2022 11:38   CT Renal Stone Study  Result Date: 03/12/2022 CLINICAL DATA:  Bladder neck obstruction EXAM: CT ABDOMEN AND PELVIS WITHOUT CONTRAST TECHNIQUE: Multidetector CT imaging of the abdomen and pelvis was performed following the standard protocol without IV contrast. RADIATION DOSE REDUCTION: This exam was performed according to the departmental dose-optimization program which includes automated exposure control, adjustment of the mA and/or kV according to patient size and/or use of iterative reconstruction technique. COMPARISON:  Renal ultrasound earlier 03/12/2022.  CT 01/31/2022. FINDINGS: Lower chest: Breathing motion at the lung bases. Calcified nodule at the left lower lobe consistent with old granulomatous disease. Mild dependent atelectasis. No effusion. The heart is enlarged. Small pericardial effusion. Coronary artery calcifications are seen. Left hilar calcified lymph nodes noted at the edge of the imaging field. Hepatobiliary: On this non IV contrast exam, the liver is grossly preserved. Gallbladder is  distended with sludge and stones Pancreas: Mild pancreatic atrophy. Stable mild pancreatic duct ectasia. Spleen: Spleen is nonenlarged.  Splenic granulomas. Adrenals/Urinary Tract: Adrenal glands are preserved. There is severe left and moderate severe right renal collecting system dilatation with perinephric stranding. No obstructing renal stones. Previously on the contrast study there was a focal stricture identified along the proximal and mid ureter on the left which is again suggested today. On the right there is a caliber change as well the mid ureter, level of the iliac vessels. Etiology is uncertain otherwise recommend further evaluation. Preserved contours of the urinary bladder with some luminal air. Stomach/Bowel: On this non oral contrast exam large bowel is of normal course and caliber with scattered stool. Scattered colonic diverticula. Surgical changes along the base of the cecum. There is moderate debris in the stomach. The small bowel grossly normal course and caliber. Vascular/Lymphatic: Normal caliber aorta and IVC with some scattered calcified plaque. There is slightly tortuous course of the mid abdominal aorta. Reproductive: Calcifications along the uterus. There is a large pessary device in place. No separate adnexal mass on this noncontrast study. Other: Anasarca. No significant ascites. No obvious free air. There is some mild stranding of the upper retroperitoneum near the course of the duodenal, unchanged from the prior examination. This includes along the course of the left renal vein. There is some soft tissue thickening as well. Musculoskeletal: Screws along the left femoral neck are identified. Diffuse degenerative changes along the pelvis and spine with curvature of the spine. Mild compression deformities asymmetric at L2 and T12 are noted. IMPRESSION: As seen on the ultrasound there is increasing collecting system dilatation bilaterally with some retroperitoneal stranding there is focal  caliber change identified along the course of the mid ureters bilaterally. This would have a broad differential. In addition there is soft tissue thickening and stranding in the upper retroperitoneum near the third portion of the duodenal in the left renal vein. Recommend additional contrast examination including delays to better define this area and the aforementioned abnormality of the kidneys. Long renal delays and imaging of the ureters may be of some benefit. Distended gallbladder with stones and sludge. Colonic diverticula. Pessary. Electronically Signed   By: Jill Side M.D.   On: 03/12/2022 17:22   US Renal  Result Date: 03/12/2022 CLINICAL DATA:  Hydronephrosis.  Renal insufficiency EXAM: RENAL / URINARY TRACT ULTRASOUND COMPLETE COMPARISON:  CT 01/31/2022 and older.  Ultrasound 2014. FINDINGS: Right  Kidney: Renal measurements: 11.4 x 4.7 x 5.1 = volume: 142 mL. Moderate collecting system dilatation. No perinephric stranding. This is new from prior. There is a small echogenic area towards the lower pole of the right kidney measuring 13 mm consistent with known fat containing lesion on CT scan, angiomyolipoma Left Kidney: Renal measurements: 11.6 x 6.3 x 5.4 = volume: 207.2 mL. There is moderate to severe left-sided renal collecting system dilatation, similar to prior CT. No perinephric stranding. Bladder: Underdistended urinary bladder.  Preserved contour. Other: Limited visualization of the aorta and IVC IMPRESSION: Bilateral collecting system dilatation, more severe on the left than right. Left is similar to prior CT but the right is progressive. Please correlate for etiology. 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 Electronically Signed   By: Jill Side M.D.   On: 03/12/2022 13:59   DG Chest 2 View  Result Date: 03/12/2022 CLINICAL DATA:  Bibasilar rales, abnormal labs. EXAM: CHEST - 2 VIEW COMPARISON:  08/04/2019 and CT chest 03/06/2022. FINDINGS: Trachea is  midline. Heart is enlarged, stable. Thoracic aorta is calcified. Calcified left lower lobe granuloma. Minimal bibasilar subsegmental volume loss. No airspace consolidation or pleural fluid. T8 and L1 compression fractures. IMPRESSION: 1. No acute findings. 2.  Aortic atherosclerosis (ICD10-I70.0). Electronically Signed   By: Lorin Picket M.D.   On: 03/12/2022 12:42   CT Head Wo Contrast  Result Date: 03/12/2022 CLINICAL DATA:  Altered mental status for 2 weeks EXAM: CT HEAD WITHOUT CONTRAST TECHNIQUE: Contiguous axial images were obtained from the base of the skull through the vertex without intravenous contrast. RADIATION DOSE REDUCTION: This exam was performed according to the departmental dose-optimization program which includes automated exposure control, adjustment of the mA and/or kV according to patient size and/or use of iterative reconstruction technique. COMPARISON:  Brain MRI 06/06/2009 FINDINGS: Brain: No evidence of acute infarction, hemorrhage, hydrocephalus, extra-axial collection or mass lesion/mass effect. Generalized cortical atrophy, moderate to advanced. Chronic small vessel ischemia in the deep cerebral white matter. Vascular: No hyperdense vessel or unexpected calcification. Skull: Normal. Negative for fracture or focal lesion. Sinuses/Orbits: No acute finding. IMPRESSION: Generalized involution of the brain without acute superimposed finding. Electronically Signed   By: Jorje Guild M.D.   On: 03/12/2022 12:22   CT ANGIO CHEST AORTA W/CM & OR WO/CM  Result Date: 03/06/2022 CLINICAL DATA:  Aortic aneurysm follow-up. Aneurysm of ascending aorta without rupture. EXAM: CT ANGIOGRAPHY CHEST WITH CONTRAST TECHNIQUE: Multidetector CT imaging of the chest was performed using the standard protocol during bolus administration of intravenous contrast. Multiplanar CT image reconstructions and MIPs were obtained to evaluate the vascular anatomy. RADIATION DOSE REDUCTION: This exam was  performed according to the departmental dose-optimization program which includes automated exposure control, adjustment of the mA and/or kV according to patient size and/or use of iterative reconstruction technique. CONTRAST:  39m ISOVUE-370 IOPAMIDOL (ISOVUE-370) INJECTION 76% COMPARISON:  Prior CT scan of the chest 02/19/2021 FINDINGS: Cardiovascular: Conventional 3 vessel arch anatomy. Stable aneurysmal dilation of the tubular portion of the ascending thoracic aorta. Precise measurement is somewhat difficult due to cardiac motion artifact. The aneurysm is stable at a maximum of 5.5 cm. The aortic root is within normal limits at 3.8 cm. No effacement of the sino-tubular junction. Scattered atherosclerotic vascular calcifications. No aneurysm. Atherosclerotic calcifications present throughout the coronary arteries. The heart is normal in size. No pericardial effusion. Mediastinum/Nodes: Left hilar and mediastinal calcifications consistent with old granulomatous disease within the nodal system. No suspicious lymphadenopathy  or mediastinal mass. Unremarkable thyroid gland. Lungs/Pleura: Focal bronchiectasis in the inferior middle lobe. Mild biapical pleural parenchymal scarring. 5 mm calcified granuloma in the periphery of the left lower lobe. No suspicious pulmonary nodule. No pleural effusion or pneumothorax. Upper Abdomen: Significantly decreased perfusion of the left kidney relative to the right. This suggests the possibility of interval left renal artery occlusion. Musculoskeletal: Stable chronic T8 and T12 compression fractures without significant interval height loss. No acute osseous abnormality. Review of the MIP images confirms the above findings. IMPRESSION: 1. Stable fusiform aneurysmal dilation of the ascending thoracic aorta with a maximal diameter of 5.5 cm. Cardiothoracic surgery consultation has already been performed and the patient is under the care of a cardiothoracic surgeon. 2. New significant  hypoenhancement of the visualized portion of the left kidney suggests interval occlusion of the left renal artery. Does the patient have clinical symptoms of left flank pain or any changes in renal function? 3. Additional ancillary findings as above without significant interval change. Aortic aneurysm NOS (ICD10-I71.9); Aortic Atherosclerosis (ICD10-I70.0). These results will be called to the ordering clinician or representative by the Radiologist Assistant, and communication documented in the PACS or [employer]. Signed, Criselda Peaches, MD, [facility] Electronically Signed   By: Jacqulynn Cadet M.D.   On: 03/06/2022 11:16     Time coordinating discharge: Over 30 minutes    Dwyane Dee, MD  [facility] 03/18/2022, 1:48 PM

## 2022-03-18 NOTE — Plan of Care (Signed)

## 2022-03-18 NOTE — Plan of Care (Signed)

## 2022-03-18 NOTE — Progress Notes (Signed)
Mobility Specialist - Progress Note   03/18/22 1000  Mobility  Activity Transferred from bed to chair  Level of Assistance Moderate assist, patient does 50-74%  Assistive Device Front wheel walker  Distance Ambulated (ft) 4 ft  Activity Response Tolerated well  Mobility Referral Yes  $Mobility charge 1 Mobility    Pt received in bed agreeable to mobility. ModA while pivoting d/t LOB. Left in recliner w/ chair alarm on and call bell in reach.   [place] Specialist Please contact via SecureChat or Rehab office at [phone]

## 2022-03-20 ENCOUNTER — Encounter (HOSPITAL_COMMUNITY): Admission: RE | Payer: Self-pay | Source: Ambulatory Visit

## 2022-03-20 ENCOUNTER — Ambulatory Visit (HOSPITAL_COMMUNITY): Admission: RE | Admit: 2022-03-20 | Payer: Medicare Other | Source: Ambulatory Visit | Admitting: Urology

## 2022-03-20 SURGERY — CYSTOURETEROSCOPY, WITH RETROGRADE PYELOGRAM AND STENT INSERTION
Anesthesia: General | Laterality: Left

## 2022-03-22 DIAGNOSIS — D649 Anemia, unspecified: Secondary | ICD-10-CM | POA: Diagnosis not present

## 2022-03-25 DIAGNOSIS — E871 Hypo-osmolality and hyponatremia: Secondary | ICD-10-CM | POA: Diagnosis not present

## 2022-03-29 DIAGNOSIS — R319 Hematuria, unspecified: Secondary | ICD-10-CM | POA: Diagnosis not present

## 2022-04-06 DIAGNOSIS — G9341 Metabolic encephalopathy: Secondary | ICD-10-CM | POA: Diagnosis not present

## 2022-04-06 DIAGNOSIS — I4891 Unspecified atrial fibrillation: Secondary | ICD-10-CM | POA: Diagnosis not present

## 2022-04-06 DIAGNOSIS — R0902 Hypoxemia: Secondary | ICD-10-CM | POA: Diagnosis not present

## 2022-04-06 DIAGNOSIS — N390 Urinary tract infection, site not specified: Secondary | ICD-10-CM | POA: Diagnosis not present

## 2022-04-06 DIAGNOSIS — R509 Fever, unspecified: Secondary | ICD-10-CM | POA: Diagnosis not present

## 2022-04-06 DIAGNOSIS — J9811 Atelectasis: Secondary | ICD-10-CM | POA: Diagnosis not present

## 2022-04-06 DIAGNOSIS — R4182 Altered mental status, unspecified: Secondary | ICD-10-CM | POA: Diagnosis not present

## 2022-04-06 DIAGNOSIS — R000 Tachycardia, unspecified: Secondary | ICD-10-CM | POA: Diagnosis not present

## 2022-04-06 DIAGNOSIS — I700 Atherosclerosis of aorta: Secondary | ICD-10-CM | POA: Diagnosis not present

## 2022-04-06 DIAGNOSIS — A419 Sepsis, unspecified organism: Secondary | ICD-10-CM | POA: Diagnosis not present

## 2022-04-06 DIAGNOSIS — R7881 Bacteremia: Secondary | ICD-10-CM | POA: Diagnosis not present

## 2022-04-06 DIAGNOSIS — Z515 Encounter for palliative care: Secondary | ICD-10-CM | POA: Diagnosis not present

## 2022-04-06 DIAGNOSIS — R451 Restlessness and agitation: Secondary | ICD-10-CM | POA: Diagnosis not present

## 2022-04-06 DIAGNOSIS — F69 Unspecified disorder of adult personality and behavior: Secondary | ICD-10-CM | POA: Diagnosis not present

## 2022-04-06 DIAGNOSIS — N179 Acute kidney failure, unspecified: Secondary | ICD-10-CM | POA: Diagnosis not present

## 2022-04-06 DIAGNOSIS — Z466 Encounter for fitting and adjustment of urinary device: Secondary | ICD-10-CM | POA: Diagnosis not present

## 2022-04-06 DIAGNOSIS — N3001 Acute cystitis with hematuria: Secondary | ICD-10-CM | POA: Diagnosis not present

## 2022-04-06 DIAGNOSIS — R630 Anorexia: Secondary | ICD-10-CM | POA: Diagnosis not present

## 2022-04-06 DIAGNOSIS — Z960 Presence of urogenital implants: Secondary | ICD-10-CM | POA: Diagnosis not present

## 2022-04-06 DIAGNOSIS — I10 Essential (primary) hypertension: Secondary | ICD-10-CM | POA: Diagnosis not present

## 2022-04-08 ENCOUNTER — Other Ambulatory Visit: Payer: BLUE CROSS/BLUE SHIELD

## 2022-04-13 ENCOUNTER — Encounter: Payer: Self-pay | Admitting: Family Medicine

## 2022-04-14 ENCOUNTER — Ambulatory Visit: Payer: Medicare Other | Admitting: Family Medicine

## 2022-05-07 ENCOUNTER — Telehealth: Payer: Self-pay

## 2022-05-07 NOTE — Telephone Encounter (Signed)
Received notification of death by Hospice. Pt passed away 06/02/22 at 9:40pm.

## 2022-05-26 DEATH — deceased

## 2022-07-09 ENCOUNTER — Ambulatory Visit: Payer: Medicare HMO | Admitting: Family Medicine
